# Patient Record
Sex: Female | Born: 1950
Health system: Southern US, Community
[De-identification: ages and names within clinical notes are randomized; demographics above are authoritative.]

## PROBLEM LIST (undated history)

## (undated) DIAGNOSIS — M1990 Unspecified osteoarthritis, unspecified site: Secondary | ICD-10-CM

## (undated) DIAGNOSIS — K219 Gastro-esophageal reflux disease without esophagitis: Secondary | ICD-10-CM

## (undated) DIAGNOSIS — R51 Headache: Secondary | ICD-10-CM

## (undated) DIAGNOSIS — M329 Systemic lupus erythematosus, unspecified: Secondary | ICD-10-CM

## (undated) DIAGNOSIS — F329 Major depressive disorder, single episode, unspecified: Secondary | ICD-10-CM

## (undated) DIAGNOSIS — IMO0002 Reserved for concepts with insufficient information to code with codable children: Secondary | ICD-10-CM

## (undated) DIAGNOSIS — I209 Angina pectoris, unspecified: Secondary | ICD-10-CM

## (undated) DIAGNOSIS — J40 Bronchitis, not specified as acute or chronic: Secondary | ICD-10-CM

## (undated) DIAGNOSIS — J189 Pneumonia, unspecified organism: Secondary | ICD-10-CM

## (undated) DIAGNOSIS — F32A Depression, unspecified: Secondary | ICD-10-CM

## (undated) DIAGNOSIS — I639 Cerebral infarction, unspecified: Secondary | ICD-10-CM

## (undated) DIAGNOSIS — E785 Hyperlipidemia, unspecified: Secondary | ICD-10-CM

## (undated) DIAGNOSIS — R0602 Shortness of breath: Secondary | ICD-10-CM

## (undated) DIAGNOSIS — I119 Hypertensive heart disease without heart failure: Secondary | ICD-10-CM

## (undated) DIAGNOSIS — M797 Fibromyalgia: Secondary | ICD-10-CM

## (undated) DIAGNOSIS — E669 Obesity, unspecified: Secondary | ICD-10-CM

## (undated) DIAGNOSIS — I10 Essential (primary) hypertension: Secondary | ICD-10-CM

## (undated) DIAGNOSIS — F419 Anxiety disorder, unspecified: Secondary | ICD-10-CM

## (undated) HISTORY — PX: CARDIAC CATHETERIZATION: SHX172

## (undated) HISTORY — PX: ABDOMINAL HYSTERECTOMY: SHX81

## (undated) HISTORY — PX: COLONOSCOPY: SHX174

## (undated) HISTORY — PX: FOOT SURGERY: SHX648

---

## 2000-01-09 ENCOUNTER — Encounter: Admission: RE | Admit: 2000-01-09 | Discharge: 2000-04-08 | Payer: Self-pay

## 2000-11-11 ENCOUNTER — Other Ambulatory Visit: Admission: RE | Admit: 2000-11-11 | Discharge: 2000-11-11 | Payer: Self-pay | Admitting: Obstetrics and Gynecology

## 2002-06-24 ENCOUNTER — Encounter: Admission: RE | Admit: 2002-06-24 | Discharge: 2002-06-24 | Payer: Self-pay

## 2002-08-06 ENCOUNTER — Encounter: Admission: RE | Admit: 2002-08-06 | Discharge: 2002-08-06 | Payer: Self-pay

## 2002-09-18 ENCOUNTER — Other Ambulatory Visit: Admission: RE | Admit: 2002-09-18 | Discharge: 2002-09-18 | Payer: Self-pay | Admitting: Obstetrics and Gynecology

## 2002-09-29 ENCOUNTER — Ambulatory Visit (HOSPITAL_COMMUNITY): Admission: RE | Admit: 2002-09-29 | Discharge: 2002-09-29 | Payer: Self-pay | Admitting: Obstetrics and Gynecology

## 2002-09-29 ENCOUNTER — Encounter: Payer: Self-pay | Admitting: Obstetrics and Gynecology

## 2005-12-11 ENCOUNTER — Other Ambulatory Visit: Admission: RE | Admit: 2005-12-11 | Discharge: 2005-12-11 | Payer: Self-pay | Admitting: Obstetrics and Gynecology

## 2007-09-03 ENCOUNTER — Ambulatory Visit (HOSPITAL_COMMUNITY): Admission: RE | Admit: 2007-09-03 | Discharge: 2007-09-03 | Payer: Self-pay | Admitting: *Deleted

## 2007-09-03 ENCOUNTER — Encounter (INDEPENDENT_AMBULATORY_CARE_PROVIDER_SITE_OTHER): Payer: Self-pay | Admitting: *Deleted

## 2007-11-18 ENCOUNTER — Ambulatory Visit (HOSPITAL_COMMUNITY): Admission: RE | Admit: 2007-11-18 | Discharge: 2007-11-18 | Payer: Self-pay | Admitting: *Deleted

## 2008-09-28 ENCOUNTER — Ambulatory Visit: Payer: Self-pay | Admitting: Obstetrics and Gynecology

## 2009-02-21 ENCOUNTER — Encounter: Admission: RE | Admit: 2009-02-21 | Discharge: 2009-05-22 | Payer: Self-pay | Admitting: Rheumatology

## 2009-10-24 ENCOUNTER — Other Ambulatory Visit: Payer: Self-pay

## 2011-04-24 NOTE — Op Note (Signed)
NAME:  Bridget Good, KAMER NO.:  [id number]   MEDICAL RECORD NO.:  [id number]          PATIENT TYPE:  AMB   LOCATION:  ENDO                         FACILITY:  [facility]   PHYSICIAN:  Georgiana Spinner, M.D.    DATE OF BIRTH:  07-Jan-1951   DATE OF PROCEDURE:  09/03/2007  DATE OF DISCHARGE:                               OPERATIVE REPORT   PROCEDURE:  Colonoscopy.   INDICATIONS:  Colon cancer screening.   ANESTHESIA:  Fentanyl 50 mcg, Versed 2 mg.   DESCRIPTION OF PROCEDURE:  With the patient mildly sedated in the left  lateral decubitus position, the Pentax videoscopic colonoscope was  inserted into the rectum and passed, under direct vision, to the cecum  identified by ileocecal valve and appendiceal orifice both of which were  photographed.  In the cecum there was some area of mucosa that was  somewhat reddened and raised.  I could not tell if this was normal  colonic mucosa or whether this was adenomatous tissue, so I elected to  biopsy it.  Once done, the colonoscope was then slowly withdrawn taking  circumferential views of colonic mucosa, stopping in the rectum which  appeared normal on direct, and showed hemorrhoids on retroflex view.  The endoscope was straightened and withdrawn.  The patient's vital  signs, and pulse oximeter remained stable.  The patient tolerated the  procedure well without apparent complications.   FINDINGS:  1. Internal hemorrhoids.  2. Question of polyp of the cecum.  3. Await biopsy report.  4. The patient will call me for results and follow up with me as an      outpatient.           ______________________________  Georgiana Spinner, M.D.     GMO/MEDQ  D:  09/03/2007  T:  09/03/2007  Job:  [id number]

## 2011-04-24 NOTE — Op Note (Signed)
NAME:  Bridget Good, HEUBERGER NO.:  [id number]   MEDICAL RECORD NO.:  [id number]          PATIENT TYPE:  AMB   LOCATION:  ENDO                         FACILITY:  [facility]   PHYSICIAN:  Georgiana Spinner, M.D.    DATE OF BIRTH:  [birth year]   DATE OF PROCEDURE:  11/18/2007  DATE OF DISCHARGE:                               OPERATIVE REPORT   PROCEDURE:  Upper endoscopy.   INDICATIONS FOR PROCEDURE:  Ulcers.   ANESTHESIA:  Fentanyl 50 mcg, Versed 5 mg.   PROCEDURE:  With patient mildly sedated in the left lateral decubitus  position, the Pentax videoscopic endoscope was inserted in the mouth,  passed under direct vision through the esophagus which appeared normal,  into the stomach.  Fundus, body, antrum, duodenal bulb, second portion  of duodenum were all visualized and appeared normal.  From this point,  the endoscope was slowly withdrawn, taking circumferential views of the  duodenal mucosa until the endoscope had been pulled back in the stomach,  placed in retroflexion to view the stomach from below.  The endoscope  was straightened and withdrawn, taking circumferential views of the  remaining gastric and esophageal mucosa.  Patient's vital signs and  pulse oximeter remained stable.  Patient tolerated the procedure well  without apparent complications.   FINDINGS:  Negative examination.   PLAN:  Have patient follow up with me as needed.  PPI can be  discontinued as long as patient is not on an NSAID, but would resume it  if NSAIDs are resumed.           ______________________________  Georgiana Spinner, M.D.     GMO/MEDQ  D:  11/18/2007  T:  11/18/2007  Job:  [id number]

## 2011-04-24 NOTE — Op Note (Signed)
NAME:  Bridget Good, Bridget Good NO.:  [id number]   MEDICAL RECORD NO.:  [id number]          PATIENT TYPE:  AMB   LOCATION:  ENDO                         FACILITY:  [facility]   PHYSICIAN:  Georgiana Spinner, M.D.    DATE OF BIRTH:  [birth year]   DATE OF PROCEDURE:  09/03/2007  DATE OF DISCHARGE:                               OPERATIVE REPORT   PROCEDURE PERFORMED:  Upper endoscopy.   INDICATIONS:  Gastroesophageal reflux disease.   ANESTHESIA:  Fentanyl 75 mcg, Versed 7 mg.   PROCEDURE:  With the patient mildly sedated in the left lateral  decubitus position, the Pentax videoscopic endoscope was inserted in the  mouth and passed under direct vision through the esophagus which  appeared normal.  The distal esophagus was approached and the  squamocolumnar junction was seen, photographed, and biopsies were taken  around the perimeter to rule out Barrett's esophagus.  We entered into  the stomach.  The fundus, body, antrum, duodenal bulb, and second  portion of the duodenum were visualized. From this point, the endoscope  was slowly withdrawn taking circumferential views of the duodenal mucosa  until the endoscope had been pulled back into the stomach and placed in  retroflexion to view the stomach from below. The endoscope was then  straightened and withdrawn taking circumferential views of the remaining  gastric and esophageal mucosa stopping to photograph ulcers seen in the  antrum, body and fundus that were also biopsied.  Then, the endoscope  was withdrawn.  The patient's vital signs and pulse oximeter remained  stable.  The patient tolerated the procedure well without apparent  complications.   FINDINGS:  Multiple ulcers in the stomach and antrum, body and fundus.  Biopsies of distal esophagus taken.  Await biopsy reports.  The patient  will call me for results and follow up with me as an outpatient.  Will  start the patient on PPI therapy pending results of biopsies.          ______________________________  Georgiana Spinner, M.D.     GMO/MEDQ  D:  09/03/2007  T:  09/03/2007  Job:  [id number]

## 2011-09-04 ENCOUNTER — Other Ambulatory Visit: Payer: Self-pay | Admitting: Gastroenterology

## 2011-09-07 ENCOUNTER — Other Ambulatory Visit: Payer: Self-pay

## 2011-09-19 ENCOUNTER — Ambulatory Visit
Admission: RE | Admit: 2011-09-19 | Discharge: 2011-09-19 | Disposition: A | Discharge status: Home / Self Care | Payer: PRIVATE HEALTH INSURANCE | Source: Ambulatory Visit | Attending: Gastroenterology | Admitting: Gastroenterology

## 2011-09-19 MED ORDER — IOHEXOL 300 MG/ML  SOLN: 100.0000 mL | Freq: Once | INTRAMUSCULAR | Status: AC | PRN

## 2011-12-30 ENCOUNTER — Other Ambulatory Visit: Payer: Self-pay

## 2011-12-30 ENCOUNTER — Emergency Department (HOSPITAL_COMMUNITY): Payer: PRIVATE HEALTH INSURANCE

## 2011-12-30 ENCOUNTER — Encounter (HOSPITAL_COMMUNITY): Payer: Self-pay | Admitting: *Deleted

## 2011-12-30 ENCOUNTER — Ambulatory Visit (INDEPENDENT_AMBULATORY_CARE_PROVIDER_SITE_OTHER): Payer: PRIVATE HEALTH INSURANCE

## 2011-12-30 ENCOUNTER — Inpatient Hospital Stay (HOSPITAL_COMMUNITY)
Admission: EM | Admit: 2011-12-30 | Discharge: 2012-01-02 | DRG: 303 | Disposition: A | Discharge status: Home / Self Care | Payer: PRIVATE HEALTH INSURANCE | Source: Ambulatory Visit | Attending: Cardiology | Admitting: Cardiology

## 2011-12-30 DIAGNOSIS — I10 Essential (primary) hypertension: Secondary | ICD-10-CM

## 2011-12-30 DIAGNOSIS — I2510 Atherosclerotic heart disease of native coronary artery without angina pectoris: Secondary | ICD-10-CM

## 2011-12-30 DIAGNOSIS — Z6835 Body mass index (BMI) 35.0-35.9, adult: Secondary | ICD-10-CM

## 2011-12-30 DIAGNOSIS — K219 Gastro-esophageal reflux disease without esophagitis: Secondary | ICD-10-CM | POA: Diagnosis present

## 2011-12-30 DIAGNOSIS — E669 Obesity, unspecified: Secondary | ICD-10-CM | POA: Diagnosis present

## 2011-12-30 DIAGNOSIS — R079 Chest pain, unspecified: Secondary | ICD-10-CM

## 2011-12-30 DIAGNOSIS — M1990 Unspecified osteoarthritis, unspecified site: Secondary | ICD-10-CM | POA: Diagnosis present

## 2011-12-30 DIAGNOSIS — Z79899 Other long term (current) drug therapy: Secondary | ICD-10-CM

## 2011-12-30 DIAGNOSIS — R040 Epistaxis: Secondary | ICD-10-CM

## 2011-12-30 DIAGNOSIS — R110 Nausea: Secondary | ICD-10-CM

## 2011-12-30 DIAGNOSIS — E785 Hyperlipidemia, unspecified: Secondary | ICD-10-CM | POA: Diagnosis present

## 2011-12-30 DIAGNOSIS — Z7982 Long term (current) use of aspirin: Secondary | ICD-10-CM

## 2011-12-30 DIAGNOSIS — E119 Type 2 diabetes mellitus without complications: Secondary | ICD-10-CM | POA: Diagnosis present

## 2011-12-30 DIAGNOSIS — I200 Unstable angina: Secondary | ICD-10-CM | POA: Diagnosis present

## 2011-12-30 DIAGNOSIS — I119 Hypertensive heart disease without heart failure: Secondary | ICD-10-CM

## 2011-12-30 DIAGNOSIS — E1169 Type 2 diabetes mellitus with other specified complication: Secondary | ICD-10-CM

## 2011-12-30 HISTORY — DX: Hyperlipidemia, unspecified: E78.5

## 2011-12-30 HISTORY — DX: Hypertensive heart disease without heart failure: I11.9

## 2011-12-30 HISTORY — DX: Reserved for concepts with insufficient information to code with codable children: IMO0002

## 2011-12-30 HISTORY — DX: Bronchitis, not specified as acute or chronic: J40

## 2011-12-30 HISTORY — DX: Headache: R51

## 2011-12-30 HISTORY — DX: Essential (primary) hypertension: I10

## 2011-12-30 HISTORY — DX: Gastro-esophageal reflux disease without esophagitis: K21.9

## 2011-12-30 HISTORY — DX: Shortness of breath: R06.02

## 2011-12-30 HISTORY — DX: Systemic lupus erythematosus, unspecified: M32.9

## 2011-12-30 HISTORY — DX: Fibromyalgia: M79.7

## 2011-12-30 HISTORY — DX: Angina pectoris, unspecified: I20.9

## 2011-12-30 HISTORY — DX: Obesity, unspecified: E66.9

## 2011-12-30 HISTORY — DX: Unspecified osteoarthritis, unspecified site: M19.90

## 2011-12-30 HISTORY — DX: Pneumonia, unspecified organism: J18.9

## 2011-12-30 LAB — POCT I-STAT, CHEM 8
BUN: 18 mg/dL (ref 6–23)
Calcium, Ion: 1.22 mmol/L (ref 1.12–1.32)
Chloride: 105 mEq/L (ref 96–112)
Creatinine, Ser: 1.2 mg/dL — ABNORMAL HIGH (ref 0.50–1.10)
Glucose, Bld: 134 mg/dL — ABNORMAL HIGH (ref 70–99)
HCT: 37 % (ref 36.0–46.0)
Hemoglobin: 12.6 g/dL (ref 12.0–15.0)
Potassium: 4.2 mEq/L (ref 3.5–5.1)
Sodium: 140 mEq/L (ref 135–145)
TCO2: 28 mmol/L (ref 0–100)

## 2011-12-30 LAB — CBC
HCT: 36.1 % (ref 36.0–46.0)
Hemoglobin: 11.8 g/dL — ABNORMAL LOW (ref 12.0–15.0)
MCH: 28.9 pg (ref 26.0–34.0)
MCHC: 32.7 g/dL (ref 30.0–36.0)
MCV: 88.5 fL (ref 78.0–100.0)
Platelets: 366 10*3/uL (ref 150–400)
RBC: 4.08 MIL/uL (ref 3.87–5.11)
RDW: 13.6 % (ref 11.5–15.5)
WBC: 7.5 10*3/uL (ref 4.0–10.5)

## 2011-12-30 LAB — DIFFERENTIAL
Basophils Absolute: 0 10*3/uL (ref 0.0–0.1)
Basophils Relative: 0 % (ref 0–1)
Eosinophils Absolute: 0.3 10*3/uL (ref 0.0–0.7)
Eosinophils Relative: 4 % (ref 0–5)
Lymphocytes Relative: 25 % (ref 12–46)
Lymphs Abs: 1.9 10*3/uL (ref 0.7–4.0)
Monocytes Absolute: 0.5 10*3/uL (ref 0.1–1.0)
Monocytes Relative: 7 % (ref 3–12)
Neutro Abs: 4.8 10*3/uL (ref 1.7–7.7)
Neutrophils Relative %: 64 % (ref 43–77)

## 2011-12-30 LAB — TROPONIN I
Troponin I: <0.3 ng/mL (ref <0.30)
Troponin I: <0.3 ng/mL (ref <0.30)
Troponin I: <0.3 ng/mL (ref <0.30)

## 2011-12-30 MED ORDER — ONDANSETRON HCL 4 MG/2ML IJ SOLN
INTRAMUSCULAR | Status: AC
  Filled 2011-12-30: qty 2

## 2011-12-30 MED ORDER — ONDANSETRON HCL 4 MG/2ML IJ SOLN
4.0000 mg | Freq: Once | INTRAMUSCULAR | Status: AC
  Administered 2011-12-30: 4 mg via INTRAVENOUS

## 2011-12-30 MED ORDER — MORPHINE SULFATE 4 MG/ML IJ SOLN
4.0000 mg | Freq: Once | INTRAMUSCULAR | Status: AC
  Administered 2011-12-30: 4 mg via INTRAVENOUS
  Filled 2011-12-30: qty 1

## 2011-12-30 MED ORDER — MORPHINE SULFATE 4 MG/ML IJ SOLN: 4.0000 mg | Freq: Once | INTRAMUSCULAR | Status: DC

## 2011-12-30 MED ORDER — ONDANSETRON HCL 4 MG/2ML IJ SOLN: 4.0000 mg | Freq: Once | INTRAMUSCULAR | Status: DC

## 2011-12-30 NOTE — ED Notes (Signed)
Pt comfortable family at the bedside.  She just returned from xray

## 2011-12-30 NOTE — ED Notes (Signed)
The pt is alert c/o only minor chest pain at present.  Alert oriented skin warm and dry.   She says she has had mid-chest pain intermittently all week.  This am she woke up with a headache and initially this am had a nose bleed.  She had a cath several years ago and was diagnosed with angina.

## 2011-12-30 NOTE — ED Provider Notes (Signed)
History     CSN: [id number]  Arrival date & time 12/30/11  1619   First MD Initiated Contact with Patient 12/30/11 1623      Chief Complaint  Patient presents with  . Chest Pain    (Consider location/radiation/quality/duration/timing/severity/associated sxs/prior treatment) The history is provided by the patient. No language interpreter was used.    61 year old female with history of diabetes presenting to the ED with chief complaints of chest pain. Patient states for the past week she has been experiencing intermittent bouts of chest pain. Patient describes pain as midsternal, with a pressure sensation sometimes sharp shooting pain. Pain occasionally radiates to the left arm. Pain sometimes worse with exertion as but at times at rest. She experience some headache and mild nausea without vomiting or diarrhea.  There is occasional shortness of breath with chest pain that is transient. She denies fever, cough, abdominal pain, dysuria. She has had angina equivalent chest pain in the past. Her last cardiac catherization was 5 years ago which shows some evidence of blockage according to the patient.    Past Medical History  Diagnosis Date  . Diabetes mellitus     History reviewed. No pertinent past surgical history.  History reviewed. No pertinent family history.  History  Substance Use Topics  . Smoking status: Never Smoker   . Smokeless tobacco: Not on file  . Alcohol Use: No    OB History    Grav Para Term Preterm Abortions TAB SAB Ect Mult Living                  Review of Systems  All other systems reviewed and are negative.    Allergies  Review of patient's allergies indicates not on file.  Home Medications  No current outpatient prescriptions on file.  There were no vitals taken for this visit.  Physical Exam  Nursing note and vitals reviewed. Constitutional: She is oriented to person, place, and time. She appears well-developed and well-nourished. No  distress.       Awake, alert, nontoxic appearance  HENT:  Head: Atraumatic.  Eyes: Right eye exhibits no discharge. Left eye exhibits no discharge.  Neck: Neck supple.  Cardiovascular: Normal rate and regular rhythm.   Pulmonary/Chest: Effort normal. No respiratory distress. She has no wheezes. She has no rales. She exhibits no tenderness.  Abdominal: Soft. There is no tenderness. There is no rebound.  Musculoskeletal: Normal range of motion. She exhibits no tenderness.       Baseline ROM, no obvious new focal weakness  Neurological: She is alert and oriented to person, place, and time.       Mental status and motor strength appears baseline for patient and situation  Skin: No rash noted.  Psychiatric: She has a normal mood and affect.    ED Course  Procedures (including critical care time)  Labs Reviewed - No data to display No results found.   No diagnosis found.   Date: 12/30/2011  Rate: 96  Rhythm: normal sinus rhythm  QRS Axis: left  Intervals: normal  ST/T Wave abnormalities: normal  Conduction Disutrbances:left anterior fascicular block  Narrative Interpretation:   Old EKG Reviewed: none available    MDM  Patient with history of diabetes and prior anginal pain presenting with increased angina.  Her pain has resolved after administration of sublingual nitroglycerin and aspirin.  Her EKG shows no evidence of ST elevation.  She has had prior catheterization 5 years ago.  No record available in our system.  I have discussed with my attending was seen and evaluate the patient.   8:47 PM Patient is currently pain-free. Her first troponin is negative. She is normal EKG, chest x-ray, and normal blood work. Patient is 5'5, and weight 212 pounds. Her calculated BMI is 35.3. Therefore, she would not likely meet the criteria to have a coronary CT. Patient will be sent to the CDU under the chest pain protocol. Plan to have cardiac stress test. Patient voiced understanding, and  agreement with plan.     Fayrene Helper, PA-C 01/01/12 [phone]

## 2011-12-30 NOTE — ED Provider Notes (Signed)
61 year old female comes in having been referred from urgent care where she presented with chest heaviness. Chest heaviness started at about 7:15 this morning and had been constant through the day. She was given 2 nitroglycerin at urgent care with partial relief of pain. She was also given aspirin an urgent care. EMS gave her a third nitroglycerin and pain has been completely relieved since then. She has a history of pain which consistently comes on with walking about a quarter mile. There's been no decrease in her exercise tolerance. Of note, she does have significant cardiac risk factors of diabetes and hypertension and a brother just had double bypass surgery and he is only 75. Her pain is somewhat atypical in that it has lasted multiple hours. With her risk factors, she will clearly need cardiac evaluation.  Dione Booze, MD 12/30/11 [phone]

## 2011-12-30 NOTE — ED Notes (Signed)
Pt arrived by [facility] from [facility].  C/o chest pain ss since this am.  Iv per [facility]

## 2011-12-30 NOTE — ED Notes (Signed)
Pt now states she is cp free. Will continue to monitor. Pt remains on monitor. Pt aware of POC

## 2011-12-30 NOTE — ED Notes (Signed)
The pt was given 324mg  aspirin and sl nitro enroute per [facility] and also was given zofran 4mg  iv for nausea

## 2011-12-30 NOTE — ED Notes (Signed)
RN introduced self to pt; pt chest pain free at this time and reports feeling better.

## 2011-12-30 NOTE — ED Provider Notes (Signed)
History     CSN: [id number]  Arrival date & time 12/30/11  1619   First MD Initiated Contact with Patient 12/30/11 1623      Chief Complaint  Patient presents with  . Chest Pain    (Consider location/radiation/quality/duration/timing/severity/associated sxs/prior treatment) HPI  Past Medical History  Diagnosis Date  . Diabetes mellitus     History reviewed. No pertinent past surgical history.  History reviewed. No pertinent family history.  History  Substance Use Topics  . Smoking status: Never Smoker   . Smokeless tobacco: Not on file  . Alcohol Use: No    OB History    Grav Para Term Preterm Abortions TAB SAB Ect Mult Living                  Review of Systems  Allergies  Sulfa antibiotics  Home Medications   Current Outpatient Rx  Name Route Sig Dispense Refill  . VITAMIN C PO Oral Take by mouth.    . ASPIRIN 81 MG PO CHEW Oral Chew 81 mg by mouth daily.    Marland Kitchen DILACOR XR PO Oral Take by mouth.    . MELOXICAM PO Oral Take by mouth.    . METFORMIN HCL PO Oral Take 0.5 tablets by mouth 2 (two) times daily.    Marland Kitchen SIMVASTATIN PO Oral Take by mouth.    Marland Kitchen JANUVIA PO Oral Take by mouth.    . TRAMADOL HCL PO Oral Take by mouth.    . DIOVAN PO Oral Take by mouth.      BP 137/70  Pulse 88  Temp(Src) 98.1 F (36.7 C) (Oral)  Resp 16  SpO2 99%  Physical Exam  ED Course  Procedures (including critical care time)  Labs Reviewed  CBC - Abnormal; Notable for the following:    Hemoglobin 11.8 (*)    All other components within normal limits  POCT I-STAT, CHEM 8 - Abnormal; Notable for the following:    Creatinine, Ser 1.20 (*)    Glucose, Bld 134 (*)    All other components within normal limits  DIFFERENTIAL  TROPONIN I  TROPONIN I  I-STAT, CHEM 8  TROPONIN I   Dg Chest 2 View  12/30/2011  *RADIOLOGY REPORT*  Clinical Data: Chest pain and shortness of breath.  CHEST - 2 VIEW  Comparison: PA and lateral chest 07/30/2011 and 10/20/2009.  Findings: The  lungs are clear.  Heart size is normal.  No pneumothorax or effusion.  No focal bony abnormality.  IMPRESSION: Negative chest.  Original Report Authenticated By: Bernadene Bell. D'ALESSIO, M.D.     1. Chest pain     11:02 PM Patient to CDU from [facility] on chest pain protocol. Patient seen and examined. She has very mild mid chest pain. Morphine ordered. She is not in any distress. Family and patient are in agreement with plan for coronary CT in the morning.  11:03 PM Exam:  Gen NAD; Heart RRR, nml S1,S2, no m/r/g; Lungs CTAB; Abd soft, NT, no rebound or guarding; Ext 2+ pedal pulses bilaterally, no edema.  11:26 PM Handoff to Dr. Karma Ganja.     MDM  CPP       Eustace Moore [place], [place] 12/30/11 2327

## 2011-12-30 NOTE — ED Notes (Addendum)
Cardiac Assessment: pt states she awakened with left sided cp, HA and nosebleed this a.m. Cp persisted throughout day. Pt denies radiating pain but states she did have some nausea and sob. Pt states she had 3 sl nitro prior to arrival.   Pt states she now has 3/10 cp at this time. PA Geiple made aware and orders given.

## 2011-12-30 NOTE — ED Notes (Signed)
Received report from Elizabeth, RN

## 2011-12-30 NOTE — ED Notes (Signed)
No chest pain requesting food.  given

## 2011-12-30 NOTE — ED Provider Notes (Signed)
Medical screening examination/treatment/procedure(s) were conducted as a shared visit with non-physician practitioner(s) and myself.  I personally evaluated the patient during the encounter   Dione Booze, MD 12/30/11 [phone]

## 2011-12-31 ENCOUNTER — Observation Stay (HOSPITAL_COMMUNITY): Payer: PRIVATE HEALTH INSURANCE

## 2011-12-31 ENCOUNTER — Encounter (HOSPITAL_COMMUNITY): Payer: Self-pay | Admitting: Cardiology

## 2011-12-31 ENCOUNTER — Other Ambulatory Visit: Payer: Self-pay

## 2011-12-31 DIAGNOSIS — E119 Type 2 diabetes mellitus without complications: Secondary | ICD-10-CM

## 2011-12-31 DIAGNOSIS — E1169 Type 2 diabetes mellitus with other specified complication: Secondary | ICD-10-CM

## 2011-12-31 DIAGNOSIS — E782 Mixed hyperlipidemia: Secondary | ICD-10-CM | POA: Insufficient documentation

## 2011-12-31 DIAGNOSIS — E785 Hyperlipidemia, unspecified: Secondary | ICD-10-CM | POA: Insufficient documentation

## 2011-12-31 DIAGNOSIS — K219 Gastro-esophageal reflux disease without esophagitis: Secondary | ICD-10-CM | POA: Insufficient documentation

## 2011-12-31 DIAGNOSIS — E669 Obesity, unspecified: Secondary | ICD-10-CM | POA: Insufficient documentation

## 2011-12-31 DIAGNOSIS — M1990 Unspecified osteoarthritis, unspecified site: Secondary | ICD-10-CM | POA: Insufficient documentation

## 2011-12-31 DIAGNOSIS — I119 Hypertensive heart disease without heart failure: Secondary | ICD-10-CM

## 2011-12-31 LAB — CARDIAC PANEL(CRET KIN+CKTOT+MB+TROPI)
CK, MB: 1.3 ng/mL (ref 0.3–4.0)
CK, MB: 1.4 ng/mL (ref 0.3–4.0)
Relative Index: INVALID (ref 0.0–2.5)
Relative Index: INVALID (ref 0.0–2.5)
Total CK: 47 U/L (ref 7–177)
Total CK: 45 U/L (ref 7–177)
Troponin I: <0.3 ng/mL (ref <0.30)
Troponin I: <0.3 ng/mL (ref <0.30)

## 2011-12-31 LAB — COMPREHENSIVE METABOLIC PANEL
ALT: 9 U/L (ref 0–35)
AST: 10 U/L (ref 0–37)
Albumin: 2.9 g/dL — ABNORMAL LOW (ref 3.5–5.2)
Alkaline Phosphatase: 89 U/L (ref 39–117)
BUN: 18 mg/dL (ref 6–23)
CO2: 29 mEq/L (ref 19–32)
Calcium: 9.1 mg/dL (ref 8.4–10.5)
Chloride: 106 mEq/L (ref 96–112)
Creatinine, Ser: 1.16 mg/dL — ABNORMAL HIGH (ref 0.50–1.10)
GFR calc Af Amer: 59 mL/min — ABNORMAL LOW (ref >90)
GFR calc non Af Amer: 51 mL/min — ABNORMAL LOW (ref >90)
Glucose, Bld: 105 mg/dL — ABNORMAL HIGH (ref 70–99)
Potassium: 4.1 mEq/L (ref 3.5–5.1)
Sodium: 141 mEq/L (ref 135–145)
Total Bilirubin: 0.3 mg/dL (ref 0.3–1.2)
Total Protein: 6.3 g/dL (ref 6.0–8.3)

## 2011-12-31 LAB — HEMOGLOBIN A1C
Hgb A1c MFr Bld: 8.4 % — ABNORMAL HIGH (ref <5.7)
Mean Plasma Glucose: 194 mg/dL — ABNORMAL HIGH (ref <117)

## 2011-12-31 LAB — APTT: aPTT: 42 seconds — ABNORMAL HIGH (ref 24–37)

## 2011-12-31 LAB — PROTIME-INR
INR: 1.07 (ref 0.00–1.49)
Prothrombin Time: 14.1 seconds (ref 11.6–15.2)

## 2011-12-31 LAB — GLUCOSE, CAPILLARY
Glucose-Capillary: 157 mg/dL — ABNORMAL HIGH (ref 70–99)
Glucose-Capillary: 102 mg/dL — ABNORMAL HIGH (ref 70–99)

## 2011-12-31 LAB — TSH: TSH: 1.17 u[IU]/mL (ref 0.350–4.500)

## 2011-12-31 MED ORDER — POTASSIUM CHLORIDE CRYS ER 20 MEQ PO TBCR
20.0000 meq | EXTENDED_RELEASE_TABLET | Freq: Every day | ORAL | Status: DC
  Administered 2012-01-01 – 2012-01-02 (×2): 20 meq via ORAL
  Filled 2011-12-31 (×2): qty 1

## 2011-12-31 MED ORDER — HEPARIN SOD (PORCINE) IN D5W 100 UNIT/ML IV SOLN
1500.0000 [IU]/h | INTRAVENOUS | Status: DC
  Administered 2011-12-31: 1400 [IU]/h via INTRAVENOUS
  Administered 2012-01-01: 1500 [IU]/h via INTRAVENOUS
  Administered 2012-01-01 (×2): 1600 [IU]/h via INTRAVENOUS
  Filled 2011-12-31 (×3): qty 250

## 2011-12-31 MED ORDER — METOPROLOL TARTRATE 25 MG PO TABS
100.0000 mg | ORAL_TABLET | Freq: Once | ORAL | Status: AC
  Administered 2011-12-31: 100 mg via ORAL
  Filled 2011-12-31: qty 4

## 2011-12-31 MED ORDER — POTASSIUM CHLORIDE 20 MEQ PO PACK
20.0000 meq | PACK | Freq: Every day | ORAL | Status: DC
  Filled 2011-12-31: qty 1

## 2011-12-31 MED ORDER — HEPARIN BOLUS VIA INFUSION
4000.0000 [IU] | Freq: Once | INTRAVENOUS | Status: AC
  Administered 2011-12-31: 4000 [IU] via INTRAVENOUS
  Filled 2011-12-31: qty 4000

## 2011-12-31 MED ORDER — METOPROLOL TARTRATE 1 MG/ML IV SOLN
5.0000 mg | Freq: Once | INTRAVENOUS | Status: AC
  Administered 2011-12-31: 5 mg via INTRAVENOUS
  Filled 2011-12-31: qty 10

## 2011-12-31 MED ORDER — ASPIRIN EC 81 MG PO TBEC
81.0000 mg | DELAYED_RELEASE_TABLET | Freq: Every day | ORAL | Status: DC
  Administered 2012-01-01 – 2012-01-02 (×2): 81 mg via ORAL
  Filled 2011-12-31 (×2): qty 1

## 2011-12-31 MED ORDER — SODIUM CHLORIDE 0.9 % IV SOLN: 250.0000 mL | INTRAVENOUS | Status: DC | PRN

## 2011-12-31 MED ORDER — SODIUM CHLORIDE 0.9 % IV BOLUS (SEPSIS)
1000.0000 mL | Freq: Once | INTRAVENOUS | Status: AC
  Administered 2011-12-31: 1000 mL via INTRAVENOUS

## 2011-12-31 MED ORDER — FUROSEMIDE 20 MG PO TABS
20.0000 mg | ORAL_TABLET | Freq: Every day | ORAL | Status: DC | PRN
  Filled 2011-12-31: qty 1

## 2011-12-31 MED ORDER — METOPROLOL TARTRATE 25 MG PO TABS
50.0000 mg | ORAL_TABLET | Freq: Four times a day (QID) | ORAL | Status: DC
  Administered 2011-12-31: 100 mg via ORAL
  Administered 2012-01-01 – 2012-01-02 (×5): 50 mg via ORAL
  Filled 2011-12-31 (×11): qty 2

## 2011-12-31 MED ORDER — METFORMIN HCL 500 MG PO TABS
500.0000 mg | ORAL_TABLET | Freq: Two times a day (BID) | ORAL | Status: DC
  Administered 2011-12-31 – 2012-01-02 (×3): 500 mg via ORAL
  Filled 2011-12-31 (×6): qty 1

## 2011-12-31 MED ORDER — OLMESARTAN MEDOXOMIL 40 MG PO TABS
40.0000 mg | ORAL_TABLET | Freq: Every day | ORAL | Status: DC
  Administered 2011-12-31 – 2012-01-02 (×3): 40 mg via ORAL
  Filled 2011-12-31 (×3): qty 1

## 2011-12-31 MED ORDER — NITROGLYCERIN 0.4 MG SL SUBL: 0.4000 mg | SUBLINGUAL_TABLET | SUBLINGUAL | Status: DC | PRN

## 2011-12-31 MED ORDER — SODIUM CHLORIDE 0.9 % IJ SOLN
3.0000 mL | INTRAMUSCULAR | Status: DC | PRN
  Administered 2011-12-31: 3 mL via INTRAVENOUS

## 2011-12-31 MED ORDER — IMIPRAMINE HCL 50 MG PO TABS
50.0000 mg | ORAL_TABLET | Freq: Every day | ORAL | Status: DC
  Administered 2011-12-31 – 2012-01-01 (×2): 50 mg via ORAL
  Filled 2011-12-31 (×3): qty 1

## 2011-12-31 MED ORDER — ACETAMINOPHEN 325 MG PO TABS: 650.0000 mg | ORAL_TABLET | ORAL | Status: DC | PRN

## 2011-12-31 MED ORDER — ONDANSETRON HCL 4 MG/2ML IJ SOLN: 4.0000 mg | Freq: Four times a day (QID) | INTRAMUSCULAR | Status: DC | PRN

## 2011-12-31 MED ORDER — VALSARTAN-HYDROCHLOROTHIAZIDE 320-25 MG PO TABS: 1.0000 | ORAL_TABLET | Freq: Every day | ORAL | Status: DC

## 2011-12-31 MED ORDER — LINAGLIPTIN 5 MG PO TABS
5.0000 mg | ORAL_TABLET | Freq: Every day | ORAL | Status: DC
  Administered 2012-01-01 – 2012-01-02 (×2): 5 mg via ORAL
  Filled 2011-12-31 (×3): qty 1

## 2011-12-31 MED ORDER — SIMVASTATIN 40 MG PO TABS
40.0000 mg | ORAL_TABLET | Freq: Every day | ORAL | Status: DC
  Administered 2011-12-31: 40 mg via ORAL
  Filled 2011-12-31 (×2): qty 1

## 2011-12-31 MED ORDER — GLIPIZIDE-METFORMIN HCL 2.5-500 MG PO TABS: 0.5000 | ORAL_TABLET | Freq: Two times a day (BID) | ORAL | Status: DC

## 2011-12-31 MED ORDER — TRAMADOL HCL 50 MG PO TABS: 50.0000 mg | ORAL_TABLET | Freq: Four times a day (QID) | ORAL | Status: DC | PRN

## 2011-12-31 MED ORDER — DILTIAZEM HCL ER COATED BEADS 240 MG PO CP24
240.0000 mg | ORAL_CAPSULE | Freq: Every day | ORAL | Status: DC
  Administered 2011-12-31 – 2012-01-02 (×3): 240 mg via ORAL
  Filled 2011-12-31 (×3): qty 1

## 2011-12-31 MED ORDER — METOPROLOL TARTRATE 25 MG PO TABS
ORAL_TABLET | ORAL | Status: AC
  Administered 2011-12-31: 100 mg via ORAL
  Filled 2011-12-31: qty 4

## 2011-12-31 MED ORDER — ASPIRIN 81 MG PO CHEW
81.0000 mg | CHEWABLE_TABLET | Freq: Every day | ORAL | Status: DC
  Administered 2011-12-31: 81 mg via ORAL
  Filled 2011-12-31: qty 1

## 2011-12-31 MED ORDER — DILTIAZEM HCL ER 180 MG PO CP24
180.0000 mg | ORAL_CAPSULE | Freq: Every day | ORAL | Status: DC
  Administered 2011-12-31: 180 mg via ORAL
  Filled 2011-12-31: qty 1

## 2011-12-31 MED ORDER — SODIUM CHLORIDE 0.9 % IJ SOLN
3.0000 mL | Freq: Two times a day (BID) | INTRAMUSCULAR | Status: DC
  Administered 2011-12-31 – 2012-01-01 (×2): 3 mL via INTRAVENOUS

## 2011-12-31 MED ORDER — HYDROCHLOROTHIAZIDE 25 MG PO TABS
25.0000 mg | ORAL_TABLET | Freq: Every day | ORAL | Status: DC
  Administered 2011-12-31 – 2012-01-02 (×3): 25 mg via ORAL
  Filled 2011-12-31 (×3): qty 1

## 2011-12-31 MED ORDER — INSULIN ASPART 100 UNIT/ML ~~LOC~~ SOLN
0.0000 [IU] | Freq: Three times a day (TID) | SUBCUTANEOUS | Status: DC
  Administered 2012-01-01: 3 [IU] via SUBCUTANEOUS
  Administered 2012-01-02 (×2): 4 [IU] via SUBCUTANEOUS
  Filled 2011-12-31: qty 3

## 2011-12-31 MED ORDER — VITAMIN C 500 MG PO TABS
500.0000 mg | ORAL_TABLET | Freq: Every day | ORAL | Status: DC
  Administered 2011-12-31 – 2012-01-02 (×3): 500 mg via ORAL
  Filled 2011-12-31 (×3): qty 1

## 2011-12-31 MED ORDER — MELOXICAM 15 MG PO TABS
15.0000 mg | ORAL_TABLET | Freq: Every day | ORAL | Status: DC
  Administered 2011-12-31 – 2012-01-02 (×3): 15 mg via ORAL
  Filled 2011-12-31 (×3): qty 1

## 2011-12-31 NOTE — H&P (Signed)
Admit date: 12/30/2011 Name:  Bridget Good Medical record number: [id number] DOB/Age:  1951/04/24  61 y.o.  Referring Physician:   [facility]  Primary physician: Dr. Dola Factor  Chief complaint/reason for admission:  Chest pain  HPI:  This very nice 61 year-old female has a history of hypertension, hyperlipidemia diabetes mellitus and obesity. Saturday eating she had not felt well and developed some vague heaviness in her chest and took a aspirin. She then developed a runny nose and some mild bleeding and noted some vague heaviness in her chest and had some mild nose bleeds. She then went to the urgent care Center after church on Sunday and received 2 nitroglycerin without relief and was transferred to [facility] by ambulance. The discomfort gradually went away. She had some vague arm pain and had some nausea as well as some heartburn but is feeling better at the present time. She then had an attempted a CT scan earlier today but could not achieve a low of heart rates in best cardiology was called. She is currently pain-free. She previously was inactive. She denied previous angina and had no PND, orthopnea or claudication.   Past Medical History  Diagnosis Date  . Diabetes mellitus type 2, noninsulin dependent   . Hypertensive heart disease without CHF   . Hyperlipidemia   . Obesity (BMI 30-39.9)   . GERD (gastroesophageal reflux disease)   . Osteoarthritis       Past Surgical History  Procedure Date  . Cesarean section     x 2  . Partial hysterectomy   . Foot surgery   .   Allergies: is allergic to sulfa antibiotics.    Medications:   Prior to Admission medications   Medication Sig Start Date End Date Taking? Authorizing Provider  aspirin 81 MG chewable tablet Chew 81 mg by mouth daily.   Yes Historical Provider, MD  diltiazem (CARDIZEM CD) 240 MG 24 hr capsule Take 240 mg by mouth daily.   Yes Historical Provider, MD  furosemide (LASIX) 20 MG  tablet Take 20 mg by mouth daily as needed. For fluid retension   Yes Historical Provider, MD  glipiZIDE-metformin (METAGLIP) 2.5-500 MG per tablet Take 0.5 tablets by mouth 2 (two) times daily before a meal.    Yes Historical Provider, MD  imipramine (TOFRANIL) 50 MG tablet Take 50 mg by mouth at bedtime.   Yes Historical Provider, MD  meloxicam (MOBIC) 15 MG tablet Take 15 mg by mouth daily.   Yes Historical Provider, MD  potassium chloride (KLOR-CON) 20 MEQ packet Take 20 mEq by mouth daily.   Yes Historical Provider, MD  simvastatin (ZOCOR) 40 MG tablet Take 40 mg by mouth every evening.   Yes Historical Provider, MD  sitaGLIPtin (JANUVIA) 100 MG tablet Take 100 mg by mouth daily.   Yes Historical Provider, MD  traMADol (ULTRAM) 50 MG tablet Take 50 mg by mouth every 6 (six) hours as needed. For pain   Yes Historical Provider, MD  valsartan-hydrochlorothiazide (DIOVAN-HCT) 320-25 MG per tablet Take 1 tablet by mouth daily.   Yes Historical Provider, MD  vitamin C (ASCORBIC ACID) 500 MG tablet Take 500 mg by mouth daily.   Yes Historical Provider, MD   Family history:  family history includes Diabetes in her father and sisters and Heart attack in her brother and father.  indicated that her mother is alive. She indicated that her father is deceased. She indicated that both of her sisters are alive.  She indicated that her brother is alive.   Social history:    reports that she has never smoked. She has never used smokeless tobacco. She reports that she does not drink alcohol or use illicit drugs.     Social History Narrative   Married.  Husband is psychotherapist.  Several children.  Husband is Education officer, environmental of [employer] in [place].     Review of Systems:  Negative except significant knee pain. She is at some vague nausea and has had her metformin reduced previously. She is an active. She complains of occasional headache and occasional blurred vision. She has some pain involving her ankles.  Does not have any incontinence or other issues. Other than as noted above, the remainder of the review of systems is normal  Physical Exam: BP 118/70  Pulse 80  Temp(Src) 98.4 F (36.9 C) (Oral)  Resp 15  SpO2 100% General appearance: alert, appears stated age and no distress Head: Normocephalic, without obvious abnormality, atraumatic Eyes: negative Ears: External ear and canals normal. Nose: Nares normal. Septum midline. Mucosa normal. No drainage or sinus tenderness. Throat: lips, mucosa, and tongue normal; teeth and gums normal Neck: no adenopathy, no carotid bruit, no JVD, supple, symmetrical, trachea midline and thyroid not enlarged, symmetric, no tenderness/mass/nodules Lungs: clear to auscultation bilaterally Heart: regular rate and rhythm, S1, S2 normal, no murmur, click, rub or gallop Abdomen: soft, non-tender; bowel sounds normal; no masses,  no organomegaly Extremities: extremities normal, atraumatic, no cyanosis or edema Pulses: 2+ and symmetric Skin: Skin color, texture, turgor normal. No rashes or lesions Neurologic: Alert and oriented X 3, normal strength and tone. Normal symmetric reflexes. Normal coordination and gait  Labs: Results for orders placed during the hospital encounter of 12/30/11 (from the past 24 hour(s))  CBC     Status: Abnormal   Collection Time   12/30/11  4:50 PM      Component Value Range   WBC 7.5  4.0 - 10.5 (K/uL)   RBC 4.08  3.87 - 5.11 (MIL/uL)   Hemoglobin 11.8 (*) 12.0 - 15.0 (g/dL)   HCT 16.1  09.6 - 04.5 (%)   MCV 88.5  78.0 - 100.0 (fL)   MCH 28.9  26.0 - 34.0 (pg)   MCHC 32.7  30.0 - 36.0 (g/dL)   RDW 40.9  81.1 - 91.4 (%)   Platelets 366  150 - 400 (K/uL)  DIFFERENTIAL     Status: Normal   Collection Time   12/30/11  4:50 PM      Component Value Range   Neutrophils Relative 64  43 - 77 (%)   Neutro Abs 4.8  1.7 - 7.7 (K/uL)   Lymphocytes Relative 25  12 - 46 (%)   Lymphs Abs 1.9  0.7 - 4.0 (K/uL)   Monocytes Relative 7  3  - 12 (%)   Monocytes Absolute 0.5  0.1 - 1.0 (K/uL)   Eosinophils Relative 4  0 - 5 (%)   Eosinophils Absolute 0.3  0.0 - 0.7 (K/uL)   Basophils Relative 0  0 - 1 (%)   Basophils Absolute 0.0  0.0 - 0.1 (K/uL)  TROPONIN I     Status: Normal   Collection Time   12/30/11  4:50 PM      Component Value Range   Troponin I <0.30  <0.30 (ng/mL)  POCT I-STAT, CHEM 8     Status: Abnormal   Collection Time   12/30/11  5:11 PM  Component Value Range   Sodium 140  135 - 145 (mEq/L)   Potassium 4.2  3.5 - 5.1 (mEq/L)   Chloride 105  96 - 112 (mEq/L)   BUN 18  6 - 23 (mg/dL)   Creatinine, Ser 1.61 (*) 0.50 - 1.10 (mg/dL)   Glucose, Bld 096 (*) 70 - 99 (mg/dL)   Calcium, Ion 0.45  4.09 - 1.32 (mmol/L)   TCO2 28  0 - 100 (mmol/L)   Hemoglobin 12.6  12.0 - 15.0 (g/dL)   HCT 81.1  91.4 - 78.2 (%)  TROPONIN I     Status: Normal   Collection Time   12/30/11  9:17 PM      Component Value Range   Troponin I <0.30  <0.30 (ng/mL)  TROPONIN I     Status: Normal   Collection Time   12/30/11 10:40 PM      Component Value Range   Troponin I <0.30  <0.30 (ng/mL)  GLUCOSE, CAPILLARY     Status: Abnormal   Collection Time   12/31/11  9:36 AM      Component Value Range   Glucose-Capillary 157 (*) 70 - 99 (mg/dL)    EKG: Normal sinus rhythm, normal EKG.  Radiology: Clear lungs, normal heart size   IMPRESSIONS: 1. Chest discomfort in a patient with multiple cardiovascular risks factors since resolved 2. Hypertensive heart disease 3. Non-insulin-dependent diabetes mellitus 4. Obesity 5. History of reflux 6. Hyperlipidemia under treatment  PLAN: Admit to telemetry floor, intravenous heparin for possible unstable angina. We'll administer beta blockers and try to achieve a heart rate such that she would be eligible to have a CT angiogram to rule out coronary artery disease in light of her multiple risk factors. If we cannot do this then consider Cardiolite stress testing.   Signed: Darden Palmer. MD [facility] 12/31/2011, 4:00 PM

## 2011-12-31 NOTE — ED Notes (Signed)
2003-01 Ready 

## 2011-12-31 NOTE — ED Notes (Signed)
Pt BMI 35.3 and is acceptable BMI for CTA according to CP protocol

## 2011-12-31 NOTE — ED Notes (Signed)
Ordered Heart Healthy tray 

## 2011-12-31 NOTE — ED Provider Notes (Signed)
8:51 AM Patient is in CDU under observation, chest pain protocol.  Received sign out from Dr Karma Ganja this morning.  Patient with intermittent chest pain x years, worse yesterday.  Per patient, she had cardiac cath approximately 5 years ago that showed slight blockage.   [facility]    Patient placed in CDU under protocol, scheduled for coronary CT this morning.  Patient states she had some mild lightheadedness overnight but denies CP, SOB, nausea.  On exam, pt is A&Ox4, NAD, RRR, no m/r/g, CTAB, abd soft, epigastric tenderness, extremities without edema but bilateral diffuse tenderness, distal pulses intact and equal bilaterally.  Will continue to follow.    11:06 AM Patient's heartrate remains elevated.  Dr Reche Dixon called, requested IVF if appropriate, then recheck.  Goal is for HR 65 during breath hold for 10-20 seconds.    12:41 PM Bolus has been given, patient continues to have HR of 71 while holding breath.  I have spoken with Dr Reche Dixon, who would like to proceed with the study.  Recommends checking to make sure CT is ready for [place] - RN Magda Paganini is making the call, then give patient 5mg  IV lopressor, then transport to CT where he will give further medications.  I have discussed this with Magda Paganini and have updated the patient and answered all of her questions.   1:57 PM Unable to do exam as patient's heartrate was not low enough.  I have called Dr Donnie Aho, on-call unassigned cardiology, who will come to consult on the patient.    2:56 PM Discussed plan with patient and family.  Mother is concerned because they have a strong family hx CAD, father died this year from heart complication and brother had MI last month.    4:02 PM Patient currently being seen by Dr Donnie Aho.    Patient discussed with Remi Haggard, NP, who assumes care of patient at change of shift.     Dillard Cannon [place], [place] 12/31/11 [phone]

## 2011-12-31 NOTE — ED Notes (Signed)
Patient transported to CT, BY RN

## 2011-12-31 NOTE — ED Notes (Signed)
Call received from Dr. Reche Dixon [facility]. Ordered 100mg  of metoprol PO for CTA. PA Chad made aware of same.

## 2011-12-31 NOTE — ED Provider Notes (Signed)
7:31 AM pt without complaints overnight, morning EKG as follows  Date: 12/31/2011 06:29am  Rate: 78  Rhythm: normal sinus rhythm  QRS Axis: left  Intervals: normal  ST/T Wave abnormalities: normal  Conduction Disutrbances:none  Narrative Interpretation:   Old EKG Reviewed: unchanged    Ethelda Chick, MD 12/31/11 [phone]

## 2011-12-31 NOTE — ED Notes (Signed)
Patient denies pain and is resting comfortably.  

## 2011-12-31 NOTE — Progress Notes (Signed)
ANTICOAGULATION CONSULT NOTE - Initial Consult  Pharmacy Consult for Heparin Indication: Possible angina  Allergies  Allergen Reactions  . Sulfa Antibiotics Hives, Itching and Nausea And Vomiting    Patient Measurements: Height: 5\' 5"  (165.1 cm) Weight: 211 lb 6.7 oz (95.9 kg) IBW/kg (Calculated) : 57  Heparin Dosing Weight: 69 kg  Vital Signs: Temp: 98.5 F (36.9 C) (01/21 1730) Temp src: Oral (01/21 1730) BP: 144/86 mmHg (01/21 1730) Pulse Rate: 83  (01/21 1730)  Labs:  Basename 12/31/11 1624 12/30/11 2240 12/30/11 2117 12/30/11 1711 12/30/11 1650  HGB -- -- -- 12.6 11.8*  HCT -- -- -- 37.0 36.1  PLT -- -- -- -- 366  APTT 42* -- -- -- --  LABPROT 14.1 -- -- -- --  INR 1.07 -- -- -- --  HEPARINUNFRC -- -- -- -- --  CREATININE 1.16* -- -- 1.20* --  CKTOTAL -- -- -- -- --  CKMB -- -- -- -- --  TROPONINI -- <0.30 <0.30 -- <0.30   Estimated Creatinine Clearance: 60.6 ml/min (by C-G formula based on Cr of 1.16).  Medical History: Past Medical History  Diagnosis Date  . Diabetes mellitus type 2, noninsulin dependent   . Hypertensive heart disease without CHF   . Hyperlipidemia   . Obesity (BMI 30-39.9)   . GERD (gastroesophageal reflux disease)   . Osteoarthritis     Medications:  Scheduled:    . aspirin  81 mg Oral Daily  . aspirin EC  81 mg Oral Daily  . diltiazem  240 mg Oral Daily  . diltiazem  180 mg Oral Daily  . glipiZIDE-metformin  0.5 tablet Oral BID AC  . imipramine  50 mg Oral QHS  . insulin aspart  0-20 Units Subcutaneous TID WC  . linagliptin  5 mg Oral Daily  . meloxicam  15 mg Oral Daily  . metFORMIN  500 mg Oral BID WC  . metoprolol  5 mg Intravenous Once  . metoprolol tartrate      . metoprolol tartrate  100 mg Oral Once  . metoprolol tartrate  50 mg Oral Q6H  .  morphine injection  4 mg Intravenous Once  . ondansetron      . ondansetron      . ondansetron (ZOFRAN) IV  4 mg Intravenous Once  . potassium chloride  20 mEq Oral Daily    . simvastatin  40 mg Oral q1800  . sodium chloride  1,000 mL Intravenous Once  . sodium chloride  3 mL Intravenous Q12H  . valsartan-hydrochlorothiazide  1 tablet Oral Daily  . vitamin C  500 mg Oral Daily  . DISCONTD:  morphine injection  4 mg Intravenous Once  . DISCONTD: ondansetron  4 mg Intravenous Once    Assessment: 61 year old beginning heparin therapy for possible angina  Goal of Therapy:  Heparin level 0.3-0.7 units/ml   Plan:  1) Heparin 4000 units iv bolus x 1 2) Heparin drip at 1400 units / hr 3) Heparin level 6 hours after heparin begins 4) Daily heparin level / CBC  Thank you.  Elwin Sleight 12/31/2011,5:40 PM

## 2011-12-31 NOTE — ED Provider Notes (Signed)
Medical screening examination/treatment/procedure(s) were conducted as a shared visit with non-physician practitioner(s) and myself.  I personally evaluated the patient during the encounter   Kely Dohn, MD 12/31/11 2250 

## 2011-12-31 NOTE — Progress Notes (Signed)
Observation review is complete. 

## 2012-01-01 ENCOUNTER — Other Ambulatory Visit: Payer: Self-pay

## 2012-01-01 ENCOUNTER — Inpatient Hospital Stay (HOSPITAL_COMMUNITY): Payer: PRIVATE HEALTH INSURANCE

## 2012-01-01 LAB — CBC
HCT: 32.3 % — ABNORMAL LOW (ref 36.0–46.0)
Hemoglobin: 10.6 g/dL — ABNORMAL LOW (ref 12.0–15.0)
MCH: 29.1 pg (ref 26.0–34.0)
MCHC: 32.8 g/dL (ref 30.0–36.0)
MCV: 88.7 fL (ref 78.0–100.0)
Platelets: 340 10*3/uL (ref 150–400)
RBC: 3.64 MIL/uL — ABNORMAL LOW (ref 3.87–5.11)
RDW: 13.8 % (ref 11.5–15.5)
WBC: 9.4 10*3/uL (ref 4.0–10.5)

## 2012-01-01 LAB — CARDIAC PANEL(CRET KIN+CKTOT+MB+TROPI)
CK, MB: 1.4 ng/mL (ref 0.3–4.0)
Relative Index: INVALID (ref 0.0–2.5)
Total CK: 48 U/L (ref 7–177)
Troponin I: <0.3 ng/mL (ref <0.30)

## 2012-01-01 LAB — GLUCOSE, CAPILLARY
Glucose-Capillary: 202 mg/dL — ABNORMAL HIGH (ref 70–99)
Glucose-Capillary: 189 mg/dL — ABNORMAL HIGH (ref 70–99)
Glucose-Capillary: 132 mg/dL — ABNORMAL HIGH (ref 70–99)

## 2012-01-01 LAB — HEPARIN LEVEL (UNFRACTIONATED)
Heparin Unfractionated: 0.23 IU/mL — ABNORMAL LOW (ref 0.30–0.70)
Heparin Unfractionated: 0.74 IU/mL — ABNORMAL HIGH (ref 0.30–0.70)

## 2012-01-01 LAB — LIPID PANEL
Cholesterol: 133 mg/dL (ref 0–200)
HDL: 40 mg/dL (ref >39)
LDL Cholesterol: 75 mg/dL (ref 0–99)
Total CHOL/HDL Ratio: 3.3 RATIO
Triglycerides: 92 mg/dL (ref <150)
VLDL: 18 mg/dL (ref 0–40)

## 2012-01-01 MED ORDER — METOPROLOL TARTRATE 1 MG/ML IV SOLN: 5.0000 mg | Freq: Once | INTRAVENOUS | Status: DC

## 2012-01-01 MED ORDER — METOPROLOL TARTRATE 1 MG/ML IV SOLN
INTRAVENOUS | Status: AC
  Administered 2012-01-01: 5 mg via INTRAVENOUS
  Filled 2012-01-01: qty 5

## 2012-01-01 MED ORDER — ROSUVASTATIN CALCIUM 20 MG PO TABS
20.0000 mg | ORAL_TABLET | Freq: Every day | ORAL | Status: DC
  Filled 2012-01-01: qty 1

## 2012-01-01 MED ORDER — ROSUVASTATIN CALCIUM 10 MG PO TABS
10.0000 mg | ORAL_TABLET | Freq: Every day | ORAL | Status: DC
  Administered 2012-01-01: 10 mg via ORAL
  Filled 2012-01-01 (×2): qty 1

## 2012-01-01 MED ORDER — ENOXAPARIN SODIUM 40 MG/0.4ML ~~LOC~~ SOLN
40.0000 mg | SUBCUTANEOUS | Status: DC
  Administered 2012-01-02: 40 mg via SUBCUTANEOUS
  Filled 2012-01-01: qty 0.4

## 2012-01-01 MED ORDER — NITROGLYCERIN 0.4 MG SL SUBL: 0.4000 mg | SUBLINGUAL_TABLET | Freq: Once | SUBLINGUAL | Status: DC

## 2012-01-01 MED ORDER — NITROGLYCERIN 0.4 MG SL SUBL
SUBLINGUAL_TABLET | SUBLINGUAL | Status: AC
  Administered 2012-01-01: 0.4 mg via SUBLINGUAL
  Filled 2012-01-01: qty 25

## 2012-01-01 MED ORDER — IOHEXOL 350 MG/ML SOLN
80.0000 mL | Freq: Once | INTRAVENOUS | Status: AC | PRN
  Administered 2012-01-01: 80 mL via INTRAVENOUS

## 2012-01-01 MED ORDER — PANTOPRAZOLE SODIUM 40 MG PO TBEC
40.0000 mg | DELAYED_RELEASE_TABLET | Freq: Every day | ORAL | Status: DC
  Administered 2012-01-01 – 2012-01-02 (×2): 40 mg via ORAL
  Filled 2012-01-01 (×2): qty 1

## 2012-01-01 NOTE — ED Provider Notes (Signed)
Medical screening examination/treatment/procedure(s) were conducted as a shared visit with non-physician practitioner(s) and myself.  I personally evaluated the patient during the encounter   Dione Booze, MD 01/01/12 [phone]

## 2012-01-01 NOTE — Progress Notes (Signed)
ANTICOAGULATION CONSULT NOTE - Follow Up Consult  Pharmacy Consult for heparin Indication: chest pain/ACS  Allergies  Allergen Reactions  . Sulfa Antibiotics Hives, Itching and Nausea And Vomiting    Patient Measurements: Height: 5\' 5"  (165.1 cm) Weight: 211 lb 6.7 oz (95.9 kg) IBW/kg (Calculated) : 57  Heparin Dosing Weight: 69kg  Vital Signs: Temp: 97.6 F (36.4 C) (01/21 2019) Temp src: Oral (01/21 2019) BP: 152/91 mmHg (01/22 0000) Pulse Rate: 63  (01/22 0000)  Labs:  Basename 01/01/12 0230 01/01/12 0126 01/01/12 0125 12/31/11 1842 12/31/11 1625 12/31/11 1624 12/30/11 1711 12/30/11 1650  HGB 10.6* -- -- -- -- -- 12.6 --  HCT 32.3* -- -- -- -- -- 37.0 36.1  PLT 340 -- -- -- -- -- -- 366  APTT -- -- -- -- -- 42* -- --  LABPROT -- -- -- -- -- 14.1 -- --  INR -- -- -- -- -- 1.07 -- --  HEPARINUNFRC -- 0.23* -- -- -- -- -- --  CREATININE -- -- -- -- -- 1.16* 1.20* --  CKTOTAL -- -- 48 45 47 -- -- --  CKMB -- -- 1.4 1.4 1.3 -- -- --  TROPONINI -- -- <0.30 <0.30 <0.30 -- -- --   Estimated Creatinine Clearance: 60.6 ml/min (by C-G formula based on Cr of 1.16).   Medications:  Scheduled:    . aspirin  81 mg Oral Daily  . aspirin EC  81 mg Oral Daily  . diltiazem  240 mg Oral Daily  . heparin  4,000 Units Intravenous Once  . olmesartan  40 mg Oral Daily   And  . hydrochlorothiazide  25 mg Oral Daily  . imipramine  50 mg Oral QHS  . insulin aspart  0-20 Units Subcutaneous TID WC  . linagliptin  5 mg Oral Daily  . meloxicam  15 mg Oral Daily  . metFORMIN  500 mg Oral BID WC  . metoprolol  5 mg Intravenous Once  . metoprolol tartrate  100 mg Oral Once  . metoprolol tartrate  50 mg Oral Q6H  . ondansetron      . ondansetron      . potassium chloride  20 mEq Oral Daily  . simvastatin  40 mg Oral q1800  . sodium chloride  1,000 mL Intravenous Once  . sodium chloride  3 mL Intravenous Q12H  . vitamin C  500 mg Oral Daily  . DISCONTD: diltiazem  180 mg Oral Daily    . DISCONTD: glipiZIDE-metformin  0.5 tablet Oral BID AC  . DISCONTD: potassium chloride  20 mEq Oral Daily  . DISCONTD: valsartan-hydrochlorothiazide  1 tablet Oral Daily  . DISCONTD: valsartan-hydrochlorothiazide  1 tablet Oral Daily   Infusions:    . heparin 1,400 Units/hr (12/31/11 1942)    Assessment: 60yo female subtherapeutic on heparin with initial dosing for CP.  Goal of Therapy:  Heparin level 0.3-0.7 units/ml   Plan:  Will increase gtt by ~2 units/kg/hr to 1600 units/hr and check level in 6hr.  Colleen Can PharmD BCPS 01/01/2012,3:25 AM

## 2012-01-01 NOTE — Progress Notes (Signed)
Pt to radiology for CT heart.  Verbal orders received from Dr. Llana Aliment for heart rate guidelines and lopressor administration.  Pt tolerated procedure will without complaint of discomfort, shortness of breath or chest pain.

## 2012-01-01 NOTE — Progress Notes (Signed)
Subjective:  Additional history.  Had cardiac cath 5 years ago, told minor blockage.  Saw Dr. Aleen Campi who has retired. Vague cramping chest pain early this am.  EKG normal.  Had CTA this pm.  Objective:  Vital Signs in the last 24 hours: BP 146/80  Pulse 55  Temp(Src) 98.7 F (37.1 C) (Oral)  Resp 18  Ht 5\' 5"  (1.651 m)  Wt 95.9 kg (211 lb 6.7 oz)  BMI 35.18 kg/m2  SpO2 98%  Physical Exam: Pleasant BF NAD Lungs:  Clear to A&P Cardiac:  Regular rhythm, normal S1 and S2, no S3  Extremities:  No edema present  Intake/Output from previous day: 01/21 0701 - 01/22 0700 In: 6 [I.V.:6] Out: -   Lab Results: Basic Metabolic Panel:  Basename 12/31/11 1624 12/30/11 1711  NA 141 140  K 4.1 4.2  CL 106 105  CO2 29 --  GLUCOSE 105* 134*  BUN 18 18  CREATININE 1.16* 1.20*   CBC:  Basename 01/01/12 0230 12/30/11 1711 12/30/11 1650  WBC 9.4 -- 7.5  NEUTROABS -- -- 4.8  HGB 10.6* 12.6 --  HCT 32.3* 37.0 --  MCV 88.7 -- 88.5  PLT 340 -- 366   Cardiac Enzymes:  Basename 01/01/12 0125 12/31/11 1842 12/31/11 1625  CKTOTAL 48 45 47  CKMB 1.4 1.4 1.3  CKMBINDEX -- -- --  TROPONINI <0.30 <0.30 <0.30    Protime: . Lab Results  Component Value Date   INR 1.07 12/31/2011    Telemetry: Sinus rhythm   Assessment/Plan:  1. Chest pain - negative enzymes unclear if ischemic or not.  EKG normal 2. CAD by CTA with calcification and moderate hard and soft plaque 3. Diabetes  Rec:  D/c heparin and watch.  lexiscan cardiolite to see if ischemic.       Darden Palmer.  MD [facility] 01/01/2012, 4:05 PM

## 2012-01-01 NOTE — Progress Notes (Signed)
INITIAL ADULT NUTRITION ASSESSMENT Date: 01/01/2012   Time: 10:17 AM Reason for Assessment: consult, unintentional weight loss and dysphagia  ASSESSMENT: Female 61 y.o.  Dx: Chest pain  Hx:  Past Medical History  Diagnosis Date  . Hyperlipidemia   . Obesity (BMI 30-39.9)   . GERD (gastroesophageal reflux disease)   . Osteoarthritis   . Diabetes mellitus   . Hypertension   . Angina   . Hypertensive heart disease without CHF   . Pneumonia   . Bronchitis   . Lupus     "treated for it from 1992 til 2012; dr said I don't have it anymore"  . Shortness of breath     lying down  . Shortness of breath on exertion   . Headache   . Fibromyalgia     Related Meds:     . aspirin  81 mg Oral Daily  . aspirin EC  81 mg Oral Daily  . diltiazem  240 mg Oral Daily  . heparin  4,000 Units Intravenous Once  . olmesartan  40 mg Oral Daily   And  . hydrochlorothiazide  25 mg Oral Daily  . imipramine  50 mg Oral QHS  . insulin aspart  0-20 Units Subcutaneous TID WC  . linagliptin  5 mg Oral Daily  . meloxicam  15 mg Oral Daily  . metFORMIN  500 mg Oral BID WC  . metoprolol  5 mg Intravenous Once  . metoprolol tartrate  50 mg Oral Q6H  . potassium chloride  20 mEq Oral Daily  . simvastatin  40 mg Oral q1800  . sodium chloride  1,000 mL Intravenous Once  . sodium chloride  3 mL Intravenous Q12H  . vitamin C  500 mg Oral Daily  . DISCONTD: diltiazem  180 mg Oral Daily  . DISCONTD: glipiZIDE-metformin  0.5 tablet Oral BID AC  . DISCONTD: potassium chloride  20 mEq Oral Daily  . DISCONTD: valsartan-hydrochlorothiazide  1 tablet Oral Daily  . DISCONTD: valsartan-hydrochlorothiazide  1 tablet Oral Daily     Ht: 5\' 5"  (165.1 cm)  Wt: 211 lb 6.7 oz (95.9 kg)  Ideal Wt: 57 kg  % Ideal Wt: 168%  Usual Wt: 240 lbs, 109 kg % Usual Wt: 87%  Body mass index is 35.18 kg/(m^2). Patient is obese.   Food/Nutrition Related Hx: Patient was trying to lose weight, the in April of 2012 she  began to lose weight unintentionally. Her MD told her she was losing weight to quickly, she had decreased appetite and some dysphagia. Also had some nausea. She started drinking Boost once daily to increase calorie and protein intake because she was eating very little.   Labs:  CMP     Component Value Date/Time   NA 141 12/31/2011 1624   K 4.1 12/31/2011 1624   CL 106 12/31/2011 1624   CO2 29 12/31/2011 1624   GLUCOSE 105* 12/31/2011 1624   BUN 18 12/31/2011 1624   CREATININE 1.16* 12/31/2011 1624   CALCIUM 9.1 12/31/2011 1624   PROT 6.3 12/31/2011 1624   ALBUMIN 2.9* 12/31/2011 1624   AST 10 12/31/2011 1624   ALT 9 12/31/2011 1624   ALKPHOS 89 12/31/2011 1624   BILITOT 0.3 12/31/2011 1624   GFRNONAA 51* 12/31/2011 1624   GFRAA 59* 12/31/2011 1624   I/O last 3 completed shifts: In: 6 [I.V.:6] Out: -    Diet Order: NPO, for CT scan  Supplements/Tube Feeding: none  IVF:    heparin Last Rate: 1,600 Units/hr (  01/01/12 0811)    Estimated Nutritional Needs:   Kcal: 1500-1700  Protein: 75-85 gm Fluid: 1.5 - 1.7 L  Patient was losing weight intentionally then it began to "fall off all on its own" with low appetite and poor po intake. Patient did not know what was casing poor appetite. Weight loss is indicated in this patient due to obesity. Patient has los 29 lbs, 12% since April 2012 (9months), this does not meet criteria for significant weight loss or malnutrition.   NUTRITION DIAGNOSIS:  Unintentional weight loss  RELATED TO: decreased appetite, nausea and dysphagia  AS EVIDENCE BY: 29 lbs weight loss in 9 months  MONITORING/EVALUATION(Goals): Goal: PO intake will meet >90% of estimated nutrition needs Monitor: Diet advance, PO intake, weight, labs, I/O's  EDUCATION NEEDS: -No education needs identified at this time  INTERVENTION: 1. RD will monitor PO intake for need for supplements once diet advances.    Dietitian [phone]  DOCUMENTATION CODES Per approved criteria    -Obesity Unspecified    Bridget Good 01/01/2012, 10:17 AM

## 2012-01-01 NOTE — Progress Notes (Signed)
ANTICOAGULATION CONSULT NOTE - Follow Up Consult  Pharmacy Consult for heparin Indication: chest pain/ACS  Allergies  Allergen Reactions  . Sulfa Antibiotics Hives, Itching and Nausea And Vomiting    Patient Measurements: Height: 5\' 5"  (165.1 cm) Weight: 211 lb 6.7 oz (95.9 kg) IBW/kg (Calculated) : 57  Heparin Dosing Weight:   Vital Signs: BP: 116/75 mmHg (01/22 0957) Pulse Rate: 60  (01/22 0600)  Labs:  Basename 01/01/12 1012 01/01/12 0230 01/01/12 0126 01/01/12 0125 12/31/11 1842 12/31/11 1625 12/31/11 1624 12/30/11 1711 12/30/11 1650  HGB -- 10.6* -- -- -- -- -- 12.6 --  HCT -- 32.3* -- -- -- -- -- 37.0 36.1  PLT -- 340 -- -- -- -- -- -- 366  APTT -- -- -- -- -- -- 42* -- --  LABPROT -- -- -- -- -- -- 14.1 -- --  INR -- -- -- -- -- -- 1.07 -- --  HEPARINUNFRC 0.74* -- 0.23* -- -- -- -- -- --  CREATININE -- -- -- -- -- -- 1.16* 1.20* --  CKTOTAL -- -- -- 48 45 47 -- -- --  CKMB -- -- -- 1.4 1.4 1.3 -- -- --  TROPONINI -- -- -- <0.30 <0.30 <0.30 -- -- --   Estimated Creatinine Clearance: 60.6 ml/min (by C-G formula based on Cr of 1.16).   Assessment: Anticoagulation: heparin Rx for CP; HL 0.74  Infectious Disease: afeb  Cardiovascular: VSS (asa, hctz, lopressor, benicar, dilt, zocor)  Endocrinology: hx DM: SSI, tradjenta, metformin ( cbgs 202); see sticky note regarding glipizide  Gastrointestinal / Nutrition: hx GERD  Neurology: imipramine  Nephrology: scr 1.16  Pulmonary: RA  Hematology / Oncology: h/h 10.6/32.3  PTA Medication Issues: med rec addressed  Best Practices: heparin  Goal of Therapy:  Heparin level 0.3-0.7 units/ml  Plan:  1) Heparin level is slightly supratherapeutic at 0.74.  Will decrease drip to 1500 units/hr 2) Check 6 hour heparin level  Sutter Ahlgren P 01/01/2012,11:12 AM

## 2012-01-02 ENCOUNTER — Inpatient Hospital Stay (HOSPITAL_COMMUNITY): Payer: PRIVATE HEALTH INSURANCE

## 2012-01-02 DIAGNOSIS — I2510 Atherosclerotic heart disease of native coronary artery without angina pectoris: Secondary | ICD-10-CM

## 2012-01-02 DIAGNOSIS — IMO0002 Reserved for concepts with insufficient information to code with codable children: Secondary | ICD-10-CM | POA: Insufficient documentation

## 2012-01-02 LAB — CBC
HCT: 33.2 % — ABNORMAL LOW (ref 36.0–46.0)
Hemoglobin: 10.7 g/dL — ABNORMAL LOW (ref 12.0–15.0)
MCH: 28.6 pg (ref 26.0–34.0)
MCHC: 32.2 g/dL (ref 30.0–36.0)
MCV: 88.8 fL (ref 78.0–100.0)
Platelets: 367 10*3/uL (ref 150–400)
RBC: 3.74 MIL/uL — ABNORMAL LOW (ref 3.87–5.11)
RDW: 13.6 % (ref 11.5–15.5)
WBC: 8 10*3/uL (ref 4.0–10.5)

## 2012-01-02 LAB — GLUCOSE, CAPILLARY
Glucose-Capillary: 118 mg/dL — ABNORMAL HIGH (ref 70–99)
Glucose-Capillary: 178 mg/dL — ABNORMAL HIGH (ref 70–99)
Glucose-Capillary: 193 mg/dL — ABNORMAL HIGH (ref 70–99)
Glucose-Capillary: 136 mg/dL — ABNORMAL HIGH (ref 70–99)

## 2012-01-02 MED ORDER — TECHNETIUM TC 99M TETROFOSMIN IV KIT
10.0000 | PACK | Freq: Once | INTRAVENOUS | Status: AC | PRN
  Administered 2012-01-02: 10 via INTRAVENOUS

## 2012-01-02 MED ORDER — METOPROLOL TARTRATE 50 MG PO TABS: 25.0000 mg | ORAL_TABLET | Freq: Two times a day (BID) | ORAL | Status: DC

## 2012-01-02 MED ORDER — NITROGLYCERIN 0.4 MG SL SUBL: 0.4000 mg | SUBLINGUAL_TABLET | SUBLINGUAL | Status: DC | PRN

## 2012-01-02 MED ORDER — TECHNETIUM TC 99M TETROFOSMIN IV KIT
30.0000 | PACK | Freq: Once | INTRAVENOUS | Status: AC | PRN
  Administered 2012-01-02: 30 via INTRAVENOUS

## 2012-01-02 MED ORDER — METFORMIN HCL 500 MG PO TABS: 500.0000 mg | ORAL_TABLET | Freq: Two times a day (BID) | ORAL | Status: DC

## 2012-01-02 MED ORDER — PANTOPRAZOLE SODIUM 40 MG PO TBEC: 40.0000 mg | DELAYED_RELEASE_TABLET | Freq: Every day | ORAL | Status: DC

## 2012-01-02 MED ORDER — REGADENOSON 0.4 MG/5ML IV SOLN
0.4000 mg | Freq: Once | INTRAVENOUS | Status: AC
  Administered 2012-01-02: 0.4 mg via INTRAVENOUS

## 2012-01-02 NOTE — Progress Notes (Signed)
Clinical Social Worker received referral for advance directive request. CSW met with patient and her son, Gerlene Burdock at bedside. CSW provided patient with the advance directive packet and a MOST form to be completed with her physician. CSW informed patient that the hospital can notarize form if it's complete during her admission and of other places that can assist with notarizing form outside of the hospital. CSW answered patient's questions. CSW will sign off as social work intervention is no longer needed. Please consult [place] again if new needs arises.   Rozetta Nunnery MSW, [employer] [phone]

## 2012-01-02 NOTE — Procedures (Signed)
Lexiscan cardiolite done without comps. Nausea and mild chest tightness with infusion. No ST changes.  Lacretia Nicks. Ashley Royalty MD [facility]

## 2012-01-02 NOTE — Discharge Summary (Signed)
Physician Discharge Summary  Patient ID: Bridget Good MRN: [id number] DOB/AGE: 1951-05-07 61 y.o.  Admit date: 12/30/2011 Discharge date: 01/02/2012  Primary Physician: Dr. Adela Lank  Primary Discharge Diagnosis: 1. Chest pain of undetermined etiology, myocardial infarction ruled out: Possible unstable angina pectoris  Secondary Discharge Diagnosis: 2. Coronary artery disease moderate 3. Hypertensive heart disease 4. Non-insulin-dependent diabetes mellitus 5. Obesity 6. Mild anemia 7. History of esophageal reflux 8. Hyperlipidemia under treatment 9. Remote history reported of lupus  Procedures::  Lexiscan Cardiolite study, CT angiogram of heart  Hospital Course: This very nice 61 year-old female has a history of hypertension, hyperlipidemia diabetes mellitus and obesity. Saturday eating she had not felt well and developed some vague heaviness in her chest and took a aspirin. She then developed a runny nose and some mild bleeding and noted some vague heaviness in her chest and had some mild nose bleeds. She then went to the urgent care Center after church on Sunday and received 2 nitroglycerin without relief and was transferred to [facility] by ambulance. The discomfort gradually went away. She had some vague arm pain and had some nausea as well as some heartburn but is feeling better at the present time. She then had an attempted a CT scan earlier today but could not achieve a low of heart rates and thus cardiology was called. She did have a history of having had coronary artery disease at catheterization of a mild degree about 5 years ago when she was seen by Dr. Aleen Campi.  The patient was brought into the hospital and placed on intravenous heparin. Serial cardiac enzymes were negative. She was started on metoprolol and achieved a low heart rate and underwent a cardiac CT the next day. She had a less than 50% plaque involving her left anterior descending and had plaque  involving the right coronary artery. There is minimal plaque involving the circumflex coronary artery. She had some vague atypical chest pain. She underwent today Lexiscan Cardiolite study the day of discharge that showed an ejection fraction of 69% with no ischemia.  At this point in time she is diagnosed with coronary artery disease but had atypical chest pain. She'll be placed on metoprolol 25 mg twice daily and given nitroglycerin. She should lose down to ideal body weight and get involved in a regular exercise program. In addition she needs to have excellent diabetic control and should have consideration of trying to achieve LDL levels of less than 70.  Discharge Exam: Blood pressure 126/81, pulse 65, temperature 97.5 F (36.4 C), temperature source Oral, resp. rate 18, height 5\' 5"  (1.651 m), weight 95.9 kg (211 lb 6.7 oz), SpO2 99.00%.   Lungs clear, no S3 or murmur.  Labs: CBC:   Lab Results  Component Value Date   WBC 8.0 01/02/2012   HGB 10.7* 01/02/2012   HCT 33.2* 01/02/2012   MCV 88.8 01/02/2012   PLT 367 01/02/2012   CMP:  Lab 12/31/11 1624  NA 141  K 4.1  CL 106  CO2 29  BUN 18  CREATININE 1.16*  CALCIUM 9.1  PROT 6.3  BILITOT 0.3  ALKPHOS 89  ALT 9  AST 10  GLUCOSE 105*   Lipid Panel     Component Value Date/Time   CHOL 133 01/01/2012 0230   TRIG 92 01/01/2012 0230   HDL 40 01/01/2012 0230   CHOLHDL 3.3 01/01/2012 0230   VLDL 18 01/01/2012 0230   LDLCALC 75 01/01/2012 0230   Cardiac Enzymes:  Basename 01/01/12 0125 12/31/11 1842 12/31/11 1625  CKTOTAL 48 45 47  CKMB 1.4 1.4 1.3  CKMBINDEX -- -- --  TROPONINI <0.30 <0.30 <0.30    Radiology: Cardiac CT scan CORONARY ARTERIES:  Left main coronary artery: Patent without plaque. Origin is along the superior and posterior aspect of the left sinus. Left anterior descending: There is a small amount of mixed plaque along the origin of the LAD. The stenosis is between 25-50%. There is circumferential calcified  plaque just beyond the first diagonal branch. The calcified segment is patent and the degree of narrowing is likely less than 50%. There is a prominent second diagonal branch with some mixed plaque near the origin. Additional plaque beyond the origin of the second diagonal branch. Left circumflex: Left circumflex artery is patent with minimal calcified plaque. There are two prominent obtuse marginal branches. Right coronary artery: The origin of the right coronary artery is patent with a small amount of soft plaque just beyond the origin. There is calcified plaque along the proximal right coronary artery without significant stenosis. Additional mixed plaque within the right coronary artery but no significant stenosis. Posterior descending artery: Patent Dominance: Right   CORONARY CALCIUM:  Total Agatston Score: 374  MESA database percentile: 97 %   CARDIAC MEASUREMENTS:  Interventricular septum (6 - 12 mm): 11 mm  LV posterior wall (6 - 12 mm): 15 mm  LV diameter in diastole (35 - 52 mm): 43 mm   AORTA AND PULMONARY MEASUREMENTS:  Aortic root (21 - 40 mm):  23 mm at the annulus  35 mm at the sinuses of Valsalva  24 mm at the sinotubular junction  Ascending aorta ( < 40 mm): 28 mm  Descending aorta ( < 40 mm): 23 mm  Main pulmonary artery: ( < 30 mm): 26 mm   EXTRACARDIAC FINDINGS:  No significant pericardial or pleural fluid. The visualized lungs  are clear. No gross bony abnormality.   IMPRESSION:  1. Extensive multivessel nonobstructive coronary artery disease. The most prominent disease involves the left anterior descending artery as described. The patient's total coronary artery calcium score is 373, which is 97 percentile for patient's matched age and gender.  2. Right coronary artery dominance.  EKG: Normal sinus rhythm, normal.  Discharge Medications:  Bridget Good, Bridget Good  Home Medication Instructions ZOX:[id number]   Printed on:01/02/12 1635  Medication Information                      aspirin 81 MG chewable tablet Chew 81 mg by mouth daily.           vitamin C (ASCORBIC ACID) 500 MG tablet Take 500 mg by mouth daily.           diltiazem (CARDIZEM CD) 240 MG 24 hr capsule Take 240 mg by mouth daily.           meloxicam (MOBIC) 15 MG tablet Take 15 mg by mouth daily.           glipiZIDE-metformin (METAGLIP) 2.5-500 MG per tablet Take 0.5 tablets by mouth 2 (two) times daily before a meal.            simvastatin (ZOCOR) 40 MG tablet Take 40 mg by mouth every evening.           sitaGLIPtin (JANUVIA) 100 MG tablet Take 100 mg by mouth daily.           traMADol (ULTRAM) 50 MG tablet Take 50 mg by mouth every  6 (six) hours as needed. For pain           valsartan-hydrochlorothiazide (DIOVAN-HCT) 320-25 MG per tablet Take 1 tablet by mouth daily.           potassium chloride (KLOR-CON) 20 MEQ packet Take 20 mEq by mouth daily.           imipramine (TOFRANIL) 50 MG tablet Take 50 mg by mouth at bedtime.           furosemide (LASIX) 20 MG tablet Take 20 mg by mouth daily as needed. For fluid retension           metoprolol tartrate (LOPRESSOR) 50 MG tablet Take 0.5 tablets (25 mg total) by mouth 2 (two) times daily.           nitroGLYCERIN (NITROSTAT) 0.4 MG SL tablet Place 1 tablet (0.4 mg total) under the tongue every 5 (five) minutes as needed for chest pain.           pantoprazole (PROTONIX) 40 MG tablet Take 1 tablet (40 mg total) by mouth daily at 12 noon.             Followup plans and appointments: She is to followup with Dr. Donnie Aho in one week. She is to see Dr. Juleen China in 2-3 weeks. We discussed the importance of diet, exercise, and modification further of her cardiac risk factors.   Time spent with patient to include physician time :45 minutes  Signed: W. Ashley Royalty. MD [facility] 01/02/2012, 4:35 PM

## 2012-01-02 NOTE — Progress Notes (Signed)
PHARMACIST - PHYSICIAN COMMUNICATION DR:  Donnie Aho CONCERNING:  METFORMIN SAFE ADMINISTRATION POLICY   Metformin has been  DISCONTINUED x 48 hours due to CTA of heart 1/22 w/ contrast.  It will resume on 01/04/2012. Current safety recommendations include avoiding metformin for a minimum of 48 hours after the patient's exposure to intravenous contrast media.  DESCRIPTION:  The Pharmacy Committee has adopted a policy that restricts the use of metformin in hospitalized patients until all the contraindications to administration have been ruled out. Specific contraindications are: []  Serum creatinine ? 1.5 for males []  Serum creatinine ? 1.4 for females []  Shock, acute MI, sepsis, hypoxemia, dehydration [x]  Planned administration of intravenous iodinated contrast media []  Heart Failure patients with low EF []  Acute or chronic metabolic acidosis (including DKA) Len Childs T 01/02/2012 10:19 AM

## 2013-07-02 ENCOUNTER — Other Ambulatory Visit (HOSPITAL_COMMUNITY): Payer: Self-pay | Admitting: Endocrinology

## 2013-07-02 DIAGNOSIS — R519 Headache, unspecified: Secondary | ICD-10-CM

## 2013-07-02 DIAGNOSIS — H538 Other visual disturbances: Secondary | ICD-10-CM

## 2013-07-03 ENCOUNTER — Ambulatory Visit (HOSPITAL_COMMUNITY): Admission: RE | Admit: 2013-07-03 | Payer: 59 | Source: Ambulatory Visit

## 2013-07-08 ENCOUNTER — Ambulatory Visit (HOSPITAL_COMMUNITY): Admission: RE | Admit: 2013-07-08 | Payer: 59 | Source: Ambulatory Visit

## 2013-07-16 ENCOUNTER — Ambulatory Visit (HOSPITAL_COMMUNITY)
Admission: RE | Admit: 2013-07-16 | Discharge: 2013-07-16 | Disposition: A | Discharge status: Home / Self Care | Payer: 59 | Source: Ambulatory Visit | Attending: Endocrinology | Admitting: Endocrinology

## 2013-07-16 DIAGNOSIS — R51 Headache: Secondary | ICD-10-CM | POA: Insufficient documentation

## 2013-07-16 DIAGNOSIS — R519 Headache, unspecified: Secondary | ICD-10-CM

## 2013-07-16 DIAGNOSIS — H538 Other visual disturbances: Secondary | ICD-10-CM

## 2013-07-16 MED ORDER — IOHEXOL 300 MG/ML  SOLN
100.0000 mL | Freq: Once | INTRAMUSCULAR | Status: AC | PRN
  Administered 2013-07-16: 100 mL via INTRAVENOUS

## 2013-10-29 ENCOUNTER — Ambulatory Visit: Payer: Self-pay | Admitting: Endocrinology

## 2013-11-09 ENCOUNTER — Ambulatory Visit: Payer: Self-pay | Admitting: Endocrinology

## 2014-03-08 ENCOUNTER — Encounter (INDEPENDENT_AMBULATORY_CARE_PROVIDER_SITE_OTHER): Payer: PRIVATE HEALTH INSURANCE | Admitting: Ophthalmology

## 2014-03-10 ENCOUNTER — Encounter (INDEPENDENT_AMBULATORY_CARE_PROVIDER_SITE_OTHER): Payer: PRIVATE HEALTH INSURANCE | Admitting: Ophthalmology

## 2014-10-13 ENCOUNTER — Ambulatory Visit (INDEPENDENT_AMBULATORY_CARE_PROVIDER_SITE_OTHER): Payer: BC Managed Care – PPO | Admitting: Obstetrics & Gynecology

## 2014-10-13 ENCOUNTER — Encounter: Payer: Self-pay | Admitting: Obstetrics & Gynecology

## 2014-10-13 VITALS — BP 144/89 | HR 82 | Ht 65.0 in | Wt 185.8 lb

## 2014-10-13 DIAGNOSIS — Z01419 Encounter for gynecological examination (general) (routine) without abnormal findings: Secondary | ICD-10-CM

## 2014-10-13 DIAGNOSIS — Z0000 Encounter for general adult medical examination without abnormal findings: Secondary | ICD-10-CM

## 2014-10-13 MED ORDER — ESTRADIOL 0.1 MG/GM VA CREA
TOPICAL_CREAM | VAGINAL | Status: DC
Start: 2014-10-13 — End: 2015-07-01

## 2014-10-13 NOTE — Progress Notes (Signed)
Patient is here for routine exam.  She is having increased back, stomach and leg pain, she did have an x-ray yesterday and this is being evaluated by her primary care physician but she just wanted to let us know.  She did have her mammogram yesterday at [facility] in [place].

## 2014-10-13 NOTE — Progress Notes (Addendum)
Subjective:    Bridget Good is a 63 y.o. MAA G2P2 (2 and 55 yo kids and 2 grands)  female who presents for an annual exam. The patient has no complaints today. The patient is sexually active. GYN screening history: last pap: was normal. The patient wears seatbelts: yes. The patient participates in regular exercise: yes. Has the patient ever been transfused or tattooed?: no. The patient reports that there is not domestic violence in her life.   Menstrual History: OB History    No data available      Menarche age: 61  No LMP recorded. Patient has had a hysterectomy.    The following portions of the patient's history were reviewed and updated as appropriate: allergies, current medications, past family history, past medical history, past social history, past surgical history and problem list.  Review of Systems A comprehensive review of systems was negative. Married for 43 years. Some dryness and pain with sex. She uses lube but would be interested in vaginal estrogen. She had her mammogram and flu vaccine yesterday. Her colonoscopy is UTD.   Objective:    BP 144/89 mmHg  Pulse 82  Ht 5\' 5"  (1.651 m)  Wt 185 lb 12.8 oz (84.278 kg)  BMI 30.92 kg/m2  General Appearance:    Alert, cooperative, no distress, appears stated age  Head:    Normocephalic, without obvious abnormality, atraumatic  Eyes:    PERRL, conjunctiva/corneas clear, EOM's intact, fundi    benign, both eyes  Ears:    Normal TM's and external ear canals, both ears  Nose:   Nares normal, septum midline, mucosa normal, no drainage    or sinus tenderness  Throat:   Lips, mucosa, and tongue normal; teeth and gums normal  Neck:   Supple, symmetrical, trachea midline, no adenopathy;    thyroid:  no enlargement/tenderness/nodules; no carotid   bruit or JVD  Back:     Symmetric, no curvature, ROM normal, no CVA tenderness  Lungs:     Clear to auscultation bilaterally, respirations unlabored  Chest Wall:    No tenderness or  deformity   Heart:    Regular rate and rhythm, S1 and S2 normal, no murmur, rub   or gallop  Breast Exam:    No tenderness, masses, or nipple abnormality  Abdomen:     Soft, non-tender, bowel sounds active all four quadrants,    no masses, no organomegaly  Genitalia:    Normal female without lesion, discharge or tenderness, NSSA, NT, normal adnexal exam     Extremities:   Extremities normal, atraumatic, no cyanosis or edema  Pulses:   2+ and symmetric all extremities  Skin:   Skin color, texture, turgor normal, no rashes or lesions  Lymph nodes:   Cervical, supraclavicular, and axillary nodes normal  Neurologic:   CNII-XII intact, normal strength, sensation and reflexes    throughout  .    Assessment:    Healthy female exam.    Plan:     Breast self exam technique reviewed and patient encouraged to perform self-exam monthly. Thin prep Pap smear. with cotesting Vaginal estrace QOD

## 2014-11-29 ENCOUNTER — Other Ambulatory Visit: Payer: Self-pay | Admitting: Endocrinology

## 2014-11-29 DIAGNOSIS — R102 Pelvic and perineal pain: Secondary | ICD-10-CM

## 2014-11-29 DIAGNOSIS — R109 Unspecified abdominal pain: Secondary | ICD-10-CM

## 2014-11-29 DIAGNOSIS — R1032 Left lower quadrant pain: Secondary | ICD-10-CM

## 2014-12-02 ENCOUNTER — Ambulatory Visit
Admission: RE | Admit: 2014-12-02 | Discharge: 2014-12-02 | Disposition: A | Discharge status: Home / Self Care | Payer: BC Managed Care – PPO | Source: Ambulatory Visit | Attending: Endocrinology | Admitting: Endocrinology

## 2014-12-02 DIAGNOSIS — R109 Unspecified abdominal pain: Secondary | ICD-10-CM

## 2014-12-02 DIAGNOSIS — R102 Pelvic and perineal pain: Secondary | ICD-10-CM

## 2014-12-02 DIAGNOSIS — R1032 Left lower quadrant pain: Secondary | ICD-10-CM

## 2014-12-06 ENCOUNTER — Other Ambulatory Visit: Payer: BC Managed Care – PPO

## 2014-12-17 ENCOUNTER — Ambulatory Visit (INDEPENDENT_AMBULATORY_CARE_PROVIDER_SITE_OTHER): Payer: PRIVATE HEALTH INSURANCE | Admitting: Ophthalmology

## 2014-12-24 ENCOUNTER — Other Ambulatory Visit: Payer: Self-pay | Admitting: Gastroenterology

## 2014-12-24 ENCOUNTER — Encounter: Payer: Self-pay | Admitting: *Deleted

## 2014-12-24 DIAGNOSIS — R1033 Periumbilical pain: Secondary | ICD-10-CM

## 2014-12-28 ENCOUNTER — Inpatient Hospital Stay: Admission: RE | Admit: 2014-12-28 | Payer: Self-pay | Source: Ambulatory Visit

## 2014-12-30 ENCOUNTER — Ambulatory Visit
Admission: RE | Admit: 2014-12-30 | Discharge: 2014-12-30 | Disposition: A | Discharge status: Home / Self Care | Payer: BLUE CROSS/BLUE SHIELD | Source: Ambulatory Visit | Attending: Gastroenterology | Admitting: Gastroenterology

## 2014-12-30 DIAGNOSIS — R1033 Periumbilical pain: Secondary | ICD-10-CM

## 2014-12-30 MED ORDER — IOHEXOL 300 MG/ML  SOLN
100.0000 mL | Freq: Once | INTRAMUSCULAR | Status: AC | PRN
  Administered 2014-12-30: 100 mL via INTRAVENOUS

## 2015-02-08 ENCOUNTER — Other Ambulatory Visit: Payer: Self-pay | Admitting: Gastroenterology

## 2015-02-08 DIAGNOSIS — R1033 Periumbilical pain: Secondary | ICD-10-CM

## 2015-02-08 DIAGNOSIS — R110 Nausea: Secondary | ICD-10-CM

## 2015-03-03 ENCOUNTER — Encounter (HOSPITAL_COMMUNITY): Payer: BLUE CROSS/BLUE SHIELD

## 2015-03-03 ENCOUNTER — Ambulatory Visit (HOSPITAL_COMMUNITY): Admission: RE | Admit: 2015-03-03 | Payer: BLUE CROSS/BLUE SHIELD | Source: Ambulatory Visit

## 2015-03-30 LAB — HM COLONOSCOPY

## 2015-05-26 ENCOUNTER — Emergency Department (HOSPITAL_COMMUNITY): Payer: BLUE CROSS/BLUE SHIELD

## 2015-05-26 ENCOUNTER — Emergency Department (HOSPITAL_COMMUNITY)
Admission: EM | Admit: 2015-05-26 | Discharge: 2015-05-26 | Disposition: A | Discharge status: Home / Self Care | Payer: BLUE CROSS/BLUE SHIELD | Attending: Emergency Medicine | Admitting: Emergency Medicine

## 2015-05-26 ENCOUNTER — Encounter (HOSPITAL_COMMUNITY): Payer: Self-pay | Admitting: Family Medicine

## 2015-05-26 DIAGNOSIS — K219 Gastro-esophageal reflux disease without esophagitis: Secondary | ICD-10-CM | POA: Insufficient documentation

## 2015-05-26 DIAGNOSIS — R51 Headache: Secondary | ICD-10-CM | POA: Diagnosis present

## 2015-05-26 DIAGNOSIS — H5713 Ocular pain, bilateral: Secondary | ICD-10-CM

## 2015-05-26 DIAGNOSIS — E785 Hyperlipidemia, unspecified: Secondary | ICD-10-CM | POA: Diagnosis not present

## 2015-05-26 DIAGNOSIS — E669 Obesity, unspecified: Secondary | ICD-10-CM | POA: Insufficient documentation

## 2015-05-26 DIAGNOSIS — Z8701 Personal history of pneumonia (recurrent): Secondary | ICD-10-CM | POA: Diagnosis not present

## 2015-05-26 DIAGNOSIS — Z7982 Long term (current) use of aspirin: Secondary | ICD-10-CM | POA: Diagnosis not present

## 2015-05-26 DIAGNOSIS — M1990 Unspecified osteoarthritis, unspecified site: Secondary | ICD-10-CM | POA: Insufficient documentation

## 2015-05-26 DIAGNOSIS — I119 Hypertensive heart disease without heart failure: Secondary | ICD-10-CM | POA: Diagnosis not present

## 2015-05-26 DIAGNOSIS — E119 Type 2 diabetes mellitus without complications: Secondary | ICD-10-CM | POA: Insufficient documentation

## 2015-05-26 DIAGNOSIS — R519 Headache, unspecified: Secondary | ICD-10-CM

## 2015-05-26 DIAGNOSIS — I209 Angina pectoris, unspecified: Secondary | ICD-10-CM | POA: Insufficient documentation

## 2015-05-26 DIAGNOSIS — R0789 Other chest pain: Secondary | ICD-10-CM | POA: Insufficient documentation

## 2015-05-26 LAB — BASIC METABOLIC PANEL
Anion gap: 10 (ref 5–15)
BUN: 22 mg/dL — ABNORMAL HIGH (ref 6–20)
CO2: 23 mmol/L (ref 22–32)
Calcium: 9.1 mg/dL (ref 8.9–10.3)
Chloride: 104 mmol/L (ref 101–111)
Creatinine, Ser: 1.32 mg/dL — ABNORMAL HIGH (ref 0.44–1.00)
GFR calc Af Amer: 49 mL/min — ABNORMAL LOW (ref >60)
GFR calc non Af Amer: 42 mL/min — ABNORMAL LOW (ref >60)
Glucose, Bld: 229 mg/dL — ABNORMAL HIGH (ref 65–99)
Potassium: 3.8 mmol/L (ref 3.5–5.1)
Sodium: 137 mmol/L (ref 135–145)

## 2015-05-26 LAB — CBC
HCT: 42.2 % (ref 36.0–46.0)
Hemoglobin: 13.8 g/dL (ref 12.0–15.0)
MCH: 29.5 pg (ref 26.0–34.0)
MCHC: 32.7 g/dL (ref 30.0–36.0)
MCV: 90.2 fL (ref 78.0–100.0)
Platelets: 304 10*3/uL (ref 150–400)
RBC: 4.68 MIL/uL (ref 3.87–5.11)
RDW: 13.9 % (ref 11.5–15.5)
WBC: 7 10*3/uL (ref 4.0–10.5)

## 2015-05-26 LAB — URINALYSIS, ROUTINE W REFLEX MICROSCOPIC
Bilirubin Urine: NEGATIVE
Glucose, UA: >1000 mg/dL — AB
Hgb urine dipstick: NEGATIVE
Ketones, ur: NEGATIVE mg/dL
Leukocytes, UA: NEGATIVE
Nitrite: NEGATIVE
Protein, ur: NEGATIVE mg/dL
Specific Gravity, Urine: 1.025 (ref 1.005–1.030)
Urobilinogen, UA: 1 mg/dL (ref 0.0–1.0)
pH: 5.5 (ref 5.0–8.0)

## 2015-05-26 LAB — I-STAT TROPONIN, ED: Troponin i, poc: 0 ng/mL (ref 0.00–0.08)

## 2015-05-26 LAB — URINE MICROSCOPIC-ADD ON

## 2015-05-26 LAB — CBG MONITORING, ED
Glucose-Capillary: 200 mg/dL — ABNORMAL HIGH (ref 65–99)
Glucose-Capillary: 123 mg/dL — ABNORMAL HIGH (ref 65–99)

## 2015-05-26 MED ORDER — ERYTHROMYCIN 5 MG/GM OP OINT: TOPICAL_OINTMENT | OPHTHALMIC | Status: DC

## 2015-05-26 MED ORDER — HYDROCODONE-ACETAMINOPHEN 5-325 MG PO TABS: 1.0000 | ORAL_TABLET | Freq: Four times a day (QID) | ORAL | Status: DC | PRN

## 2015-05-26 MED ORDER — SODIUM CHLORIDE 0.9 % IV BOLUS (SEPSIS)
1000.0000 mL | Freq: Once | INTRAVENOUS | Status: AC
Start: 2015-05-26 — End: 2015-05-26
  Administered 2015-05-26: 1000 mL via INTRAVENOUS

## 2015-05-26 MED ORDER — PROPARACAINE HCL 0.5 % OP SOLN
1.0000 [drp] | Freq: Once | OPHTHALMIC | Status: AC
  Administered 2015-05-26: 1 [drp] via OPHTHALMIC
  Filled 2015-05-26: qty 15

## 2015-05-26 MED ORDER — METOCLOPRAMIDE HCL 5 MG/ML IJ SOLN
10.0000 mg | Freq: Once | INTRAMUSCULAR | Status: AC
  Administered 2015-05-26: 10 mg via INTRAVENOUS
  Filled 2015-05-26: qty 2

## 2015-05-26 MED ORDER — BUTALBITAL-APAP-CAFFEINE 50-325-40 MG PO TABS
2.0000 | ORAL_TABLET | Freq: Once | ORAL | Status: AC
  Administered 2015-05-26: 2 via ORAL
  Filled 2015-05-26: qty 2

## 2015-05-26 MED ORDER — DIPHENHYDRAMINE HCL 50 MG/ML IJ SOLN
25.0000 mg | Freq: Once | INTRAMUSCULAR | Status: AC
  Administered 2015-05-26: 25 mg via INTRAVENOUS
  Filled 2015-05-26: qty 1

## 2015-05-26 NOTE — ED Notes (Signed)
Patient transported to MRI 

## 2015-05-26 NOTE — Discharge Instructions (Signed)
The cause of your eye pain was not identified today.  Please follow up with the eye doctor tomorrow for further evaluation.  Follow up with your doctor next week for recheck .   Headaches, Frequently Asked Questions MIGRAINE HEADACHES Q: What is migraine? What causes it? How can I treat it? A: Generally, migraine headaches begin as a dull ache. Then they develop into a constant, throbbing, and pulsating pain. You may experience pain at the temples. You may experience pain at the front or back of one or both sides of the head. The pain is usually accompanied by a combination of:  Nausea.  Vomiting.  Sensitivity to light and noise. Some people (about 15%) experience an aura (see below) before an attack. The cause of migraine is believed to be chemical reactions in the brain. Treatment for migraine may include over-the-counter or prescription medications. It may also include self-help techniques. These include relaxation training and biofeedback.  Q: What is an aura? A: About 15% of people with migraine get an "aura". This is a sign of neurological symptoms that occur before a migraine headache. You may see wavy or jagged lines, dots, or flashing lights. You might experience tunnel vision or blind spots in one or both eyes. The aura can include visual or auditory hallucinations (something imagined). It may include disruptions in smell (such as strange odors), taste or touch. Other symptoms include:  Numbness.  A "pins and needles" sensation.  Difficulty in recalling or speaking the correct word. These neurological events may last as long as 60 minutes. These symptoms will fade as the headache begins. Q: What is a trigger? A: Certain physical or environmental factors can lead to or "trigger" a migraine. These include:  Foods.  Hormonal changes.  Weather.  Stress. It is important to remember that triggers are different for everyone. To help prevent migraine attacks, you need to figure out  which triggers affect you. Keep a headache diary. This is a good way to track triggers. The diary will help you talk to your healthcare professional about your condition. Q: Does weather affect migraines? A: Bright sunshine, hot, humid conditions, and drastic changes in barometric pressure may lead to, or "trigger," a migraine attack in some people. But studies have shown that weather does not act as a trigger for everyone with migraines. Q: What is the link between migraine and hormones? A: Hormones start and regulate many of your body's functions. Hormones keep your body in balance within a constantly changing environment. The levels of hormones in your body are unbalanced at times. Examples are during menstruation, pregnancy, or menopause. That can lead to a migraine attack. In fact, about three quarters of all women with migraine report that their attacks are related to the menstrual cycle.  Q: Is there an increased risk of stroke for migraine sufferers? A: The likelihood of a migraine attack causing a stroke is very remote. That is not to say that migraine sufferers cannot have a stroke associated with their migraines. In persons under age 27, the most common associated factor for stroke is migraine headache. But over the course of a person's normal life span, the occurrence of migraine headache may actually be associated with a reduced risk of dying from cerebrovascular disease due to stroke.  Q: What are acute medications for migraine? A: Acute medications are used to treat the pain of the headache after it has started. Examples over-the-counter medications, NSAIDs, ergots, and triptans.  Q: What are the triptans? A: Triptans  are the newest class of abortive medications. They are specifically targeted to treat migraine. Triptans are vasoconstrictors. They moderate some chemical reactions in the brain. The triptans work on receptors in your brain. Triptans help to restore the balance of a  neurotransmitter called serotonin. Fluctuations in levels of serotonin are thought to be a main cause of migraine.  Q: Are over-the-counter medications for migraine effective? A: Over-the-counter, or "OTC," medications may be effective in relieving mild to moderate pain and associated symptoms of migraine. But you should see your caregiver before beginning any treatment regimen for migraine.  Q: What are preventive medications for migraine? A: Preventive medications for migraine are sometimes referred to as "prophylactic" treatments. They are used to reduce the frequency, severity, and length of migraine attacks. Examples of preventive medications include antiepileptic medications, antidepressants, beta-blockers, calcium channel blockers, and NSAIDs (nonsteroidal anti-inflammatory drugs). Q: Why are anticonvulsants used to treat migraine? A: During the past few years, there has been an increased interest in antiepileptic drugs for the prevention of migraine. They are sometimes referred to as "anticonvulsants". Both epilepsy and migraine may be caused by similar reactions in the brain.  Q: Why are antidepressants used to treat migraine? A: Antidepressants are typically used to treat people with depression. They may reduce migraine frequency by regulating chemical levels, such as serotonin, in the brain.  Q: What alternative therapies are used to treat migraine? A: The term "alternative therapies" is often used to describe treatments considered outside the scope of conventional Western medicine. Examples of alternative therapy include acupuncture, acupressure, and yoga. Another common alternative treatment is herbal therapy. Some herbs are believed to relieve headache pain. Always discuss alternative therapies with your caregiver before proceeding. Some herbal products contain arsenic and other toxins. TENSION HEADACHES Q: What is a tension-type headache? What causes it? How can I treat it? A:  Tension-type headaches occur randomly. They are often the result of temporary stress, anxiety, fatigue, or anger. Symptoms include soreness in your temples, a tightening band-like sensation around your head (a "vice-like" ache). Symptoms can also include a pulling feeling, pressure sensations, and contracting head and neck muscles. The headache begins in your forehead, temples, or the back of your head and neck. Treatment for tension-type headache may include over-the-counter or prescription medications. Treatment may also include self-help techniques such as relaxation training and biofeedback. CLUSTER HEADACHES Q: What is a cluster headache? What causes it? How can I treat it? A: Cluster headache gets its name because the attacks come in groups. The pain arrives with little, if any, warning. It is usually on one side of the head. A tearing or bloodshot eye and a runny nose on the same side of the headache may also accompany the pain. Cluster headaches are believed to be caused by chemical reactions in the brain. They have been described as the most severe and intense of any headache type. Treatment for cluster headache includes prescription medication and oxygen. SINUS HEADACHES Q: What is a sinus headache? What causes it? How can I treat it? A: When a cavity in the bones of the face and skull (a sinus) becomes inflamed, the inflammation will cause localized pain. This condition is usually the result of an allergic reaction, a tumor, or an infection. If your headache is caused by a sinus blockage, such as an infection, you will probably have a fever. An x-ray will confirm a sinus blockage. Your caregiver's treatment might include antibiotics for the infection, as well as antihistamines or decongestants.  REBOUND HEADACHES Q: What is a rebound headache? What causes it? How can I treat it? A: A pattern of taking acute headache medications too often can lead to a condition known as "rebound headache." A  pattern of taking too much headache medication includes taking it more than 2 days per week or in excessive amounts. That means more than the label or a caregiver advises. With rebound headaches, your medications not only stop relieving pain, they actually begin to cause headaches. Doctors treat rebound headache by tapering the medication that is being overused. Sometimes your caregiver will gradually substitute a different type of treatment or medication. Stopping may be a challenge. Regularly overusing a medication increases the potential for serious side effects. Consult a caregiver if you regularly use headache medications more than 2 days per week or more than the label advises. ADDITIONAL QUESTIONS AND ANSWERS Q: What is biofeedback? A: Biofeedback is a self-help treatment. Biofeedback uses special equipment to monitor your body's involuntary physical responses. Biofeedback monitors:  Breathing.  Pulse.  Heart rate.  Temperature.  Muscle tension.  Brain activity. Biofeedback helps you refine and perfect your relaxation exercises. You learn to control the physical responses that are related to stress. Once the technique has been mastered, you do not need the equipment any more. Q: Are headaches hereditary? A: Four out of five (80%) of people that suffer report a family history of migraine. Scientists are not sure if this is genetic or a family predisposition. Despite the uncertainty, a child has a 50% chance of having migraine if one parent suffers. The child has a 75% chance if both parents suffer.  Q: Can children get headaches? A: By the time they reach high school, most young people have experienced some type of headache. Many safe and effective approaches or medications can prevent a headache from occurring or stop it after it has begun.  Q: What type of doctor should I see to diagnose and treat my headache? A: Start with your primary caregiver. Discuss his or her experience and  approach to headaches. Discuss methods of classification, diagnosis, and treatment. Your caregiver may decide to recommend you to a headache specialist, depending upon your symptoms or other physical conditions. Having diabetes, allergies, etc., may require a more comprehensive and inclusive approach to your headache. The National Headache Foundation will provide, upon request, a list of [facility] physician members in your state. Document Released: 02/16/2004 Document Revised: 02/18/2012 Document Reviewed: 07/26/2008 [facility] Patient Information 2015 [place], [place]. This information is not intended to replace advice given to you by your health care provider. Make sure you discuss any questions you have with your health care provider.

## 2015-05-26 NOTE — ED Notes (Signed)
MD at bedside. 

## 2015-05-26 NOTE — ED Notes (Signed)
Patient complains of mid-sternal chest pain, hyperglycemia (359mg /dL at home), and headache. Chest pain started this morning at 4:00am. Sunday morning all the other symptoms started.

## 2015-05-26 NOTE — ED Notes (Signed)
nruse drawing labs

## 2015-05-26 NOTE — ED Notes (Signed)
CBG 123 

## 2015-05-26 NOTE — ED Notes (Signed)
CBG 200. RN made aware

## 2015-05-26 NOTE — ED Notes (Signed)
Pt transported to MRI, will draw blood upon pt's return.

## 2015-05-26 NOTE — ED Provider Notes (Signed)
CSN: [id number]     Arrival date & time 05/26/15  1103 History   First MD Initiated Contact with Patient 05/26/15 1134     Chief Complaint  Patient presents with  . Hyperglycemia  . Headache  . Chest Pain     Patient is a 64 y.o. female presenting with hyperglycemia, headaches, and chest pain. The history is provided by the patient. No language interpreter was used.  Hyperglycemia Associated symptoms: chest pain   Headache Chest Pain Associated symptoms: headache    Bridget Good presents for evaluation of multiple complaints,  Bridget Good complains of elevated blood sugar, headache, chest pain. In terms of headache, this is been ongoing for the last several days. It is waxing and waning in nature. It is dull and throbbing. There is associated pain in bilateral eyes. No fevers, vomiting, diarrhea. Bridget Good's had some intermittent chest pressure and discomfort that has been ongoing, waxing and waning since 4 AM this morning. Bridget Good has a history of chest discomfort that Bridget Good typically takes nitroglycerin for, Bridget Good has not tried it for this discomfort today. Bridget Good does not currently have any chest pain. This morning Bridget Good also awoke with weakness in her right lower extremity. Bridget Good difficulty walking because the foot was floppy. Bridget Good had some associated numbness in the right foot. This has improved since 4 AM. Bridget Good was previously on Januvia for her diabetes but this has recently been changed due to insurance issues.  Past Medical History  Diagnosis Date  . Hyperlipidemia   . Obesity (BMI 30-39.9)   . GERD (gastroesophageal reflux disease)   . Osteoarthritis   . Diabetes mellitus   . Hypertension   . Angina   . Hypertensive heart disease without CHF   . Pneumonia   . Bronchitis   . Lupus     "treated for it from 1992 til 2012; dr said I don't have it anymore"  . Shortness of breath     lying down  . Shortness of breath on exertion   . Headache(784.0)   . Fibromyalgia    Past Surgical History  Procedure  Laterality Date  . Cesarean section  1978; 1981  . Foot surgery      "had to cut it to let the fluids out; it had swollen very badly; left foot"  . Abdominal hysterectomy      partial  . Cardiac catheterization  ~ 2007   Family History  Problem Relation Age of Onset  . Heart attack Father   . Diabetes Father   . Heart attack Brother   . Diabetes Sister   . Diabetes Sister    History  Substance Use Topics  . Smoking status: Never Smoker   . Smokeless tobacco: Never Used  . Alcohol Use: No   OB History    No data available     Review of Systems  Cardiovascular: Positive for chest pain.  Neurological: Positive for headaches.  All other systems reviewed and are negative.     Allergies  Sulfa antibiotics  Home Medications   Prior to Admission medications   Medication Sig Start Date End Date Taking? Authorizing Provider  aspirin 81 MG chewable tablet Chew 81 mg by mouth daily.    Historical Provider, MD  diltiazem (CARDIZEM CD) 240 MG 24 hr capsule Take 240 mg by mouth daily.    Historical Provider, MD  estradiol (ESTRACE) 0.1 MG/GM vaginal cream Apply 1 gram per vagina every night for 2 weeks, then apply three times a week 10/13/14  Emily Filbert, MD  furosemide (LASIX) 20 MG tablet Take 20 mg by mouth daily as needed. For fluid retension    Historical Provider, MD  glipiZIDE-metformin (METAGLIP) 2.5-500 MG per tablet Take 0.5 tablets by mouth 2 (two) times daily before a meal.     Historical Provider, MD  imipramine (TOFRANIL) 50 MG tablet Take 50 mg by mouth at bedtime.    Historical Provider, MD  metoprolol tartrate (LOPRESSOR) 50 MG tablet Take 0.5 tablets (25 mg total) by mouth 2 (two) times daily. 01/02/12 01/01/13  Jacolyn Reedy, MD  nitroGLYCERIN (NITROSTAT) 0.4 MG SL tablet Place 1 tablet (0.4 mg total) under the tongue every 5 (five) minutes as needed for chest pain. 01/02/12 01/01/13  Jacolyn Reedy, MD  pantoprazole (PROTONIX) 40 MG tablet Take 1 tablet (40 mg  total) by mouth daily at 12 noon. 01/02/12 01/01/13  Jacolyn Reedy, MD  potassium chloride (KLOR-CON) 20 MEQ packet Take 20 mEq by mouth daily.    Historical Provider, MD  simvastatin (ZOCOR) 40 MG tablet Take 40 mg by mouth every evening.    Historical Provider, MD  traMADol (ULTRAM) 50 MG tablet Take 50 mg by mouth every 6 (six) hours as needed. For pain    Historical Provider, MD  valsartan-hydrochlorothiazide (DIOVAN-HCT) 320-25 MG per tablet Take 1 tablet by mouth daily.    Historical Provider, MD  vitamin C (ASCORBIC ACID) 500 MG tablet Take 500 mg by mouth daily.    Historical Provider, MD   BP 147/79 mmHg  Pulse 70  Temp(Src) 97.5 F (36.4 C) (Oral)  Resp 20  Ht 5\' 4"  (1.626 m)  Wt 189 lb (85.73 kg)  BMI 32.43 kg/m2  SpO2 99% Physical Exam  Constitutional: Bridget Good is oriented to person, place, and time. Bridget Good appears well-developed and well-nourished.  HENT:  Head: Normocephalic and atraumatic.  Eyes: EOM are normal. Pupils are equal, round, and reactive to light.  Conjunctival injection bilaterally, right greater than left  Cardiovascular: Normal rate and regular rhythm.   No murmur heard. Pulmonary/Chest: Effort normal and breath sounds normal. No respiratory distress.  Abdominal: Soft. There is no tenderness. There is no rebound and no guarding.  Musculoskeletal: Bridget Good exhibits no edema or tenderness.  Neurological: Bridget Good is alert and oriented to person, place, and time. No cranial nerve deficit. Coordination normal.  5 out of 5 strength in all 4 extremities, sensation and light touch intact in all 4 extremities  Skin: Skin is warm and dry.  Psychiatric: Bridget Good has a normal mood and affect. Her behavior is normal.  Nursing note and vitals reviewed.   ED Course  Procedures (including critical care time) Labs Review Labs Reviewed  BASIC METABOLIC PANEL - Abnormal; Notable for the following:    Glucose, Bld 229 (*)    BUN 22 (*)    Creatinine, Ser 1.32 (*)    GFR calc non Af  Amer 42 (*)    GFR calc Af Amer 49 (*)    All other components within normal limits  URINALYSIS, ROUTINE W REFLEX MICROSCOPIC (NOT AT [facility]) - Abnormal; Notable for the following:    Glucose, UA >1000 (*)    All other components within normal limits  CBG MONITORING, ED - Abnormal; Notable for the following:    Glucose-Capillary 200 (*)    All other components within normal limits  CBG MONITORING, ED - Abnormal; Notable for the following:    Glucose-Capillary 123 (*)    All other components within normal  limits  CBC  URINE MICROSCOPIC-ADD ON  I-STAT TROPOININ, ED    Imaging Review Ct Head Wo Contrast  05/26/2015   CLINICAL DATA:  Headache for 4 days  EXAM: CT HEAD WITHOUT CONTRAST  TECHNIQUE: Contiguous axial images were obtained from the base of the skull through the vertex without intravenous contrast.  COMPARISON:  July 16, 2013  FINDINGS: The ventricles are normal in size and configuration. There is no intracranial mass, hemorrhage, extra-axial fluid collection, or midline shift. Gray-white compartments are normal. No acute infarct apparent. The bony calvarium appears intact. The visualized mastoid air cells are clear.  IMPRESSION: No intracranial mass, hemorrhage, or focal gray - white compartment lesions/acute appearing infarct.   Electronically Signed   By: Lowella Grip III M.D.   On: 05/26/2015 12:38   Dg Chest Port 1 View  05/26/2015   CLINICAL DATA:  63 year old female with hyperglycemia and headache with new left-sided chest pain since 4 a.m. this morning.  EXAM: PORTABLE CHEST - 1 VIEW  COMPARISON:  No priors.  FINDINGS: Lung volumes are normal. No consolidative airspace disease. No pleural effusions. No pneumothorax. No pulmonary nodule or mass noted. Pulmonary vasculature and the cardiomediastinal silhouette are within normal limits.  IMPRESSION: No radiographic evidence of acute cardiopulmonary disease.   Electronically Signed   By: Vinnie Langton M.D.   On: 05/26/2015  11:59     EKG Interpretation   Date/Time:  Thursday May 26 2015 11:16:23 EDT Ventricular Rate:  63 PR Interval:  129 QRS Duration: 88 QT Interval:  399 QTC Calculation: 408 R Axis:   -59 Text Interpretation:  Sinus rhythm LAD, consider left anterior fascicular  block Borderline T abnormalities, inferior leads Confirmed by Hazle Coca  [phone]) on 05/26/2015 11:53:18 AM      MDM   Final diagnoses:  Acute nonintractable headache, unspecified headache type  Eye pain, bilateral    Patient here for evaluation of multiple complaints. In terms of chest pain, this is not consistent with CHF, ACS, PE. In terms of headache this is not consistent with subarachnoid hemorrhage, meningitis, dural sinus thrombosis, temporal arteritis. In terms of right lower extremity weakness this morning, MRI is negative for any evidence of acute ischemia or abnormality. For her eye pain, pressures in the right eye are 12 and 13 in the left eye. There is no evidence of foreign body, corneal abrasion, acute angle-closure glaucoma. Discussed with patient importance of ophthalmology follow-up and Bridget Good's been referred to Dr. Maylene Roes. Starting erythromycin for possible conjunctivitis. Discussed outpatient follow-up, home care, close return precautions.  Quintella Reichert, MD 05/27/15 [phone]

## 2015-07-01 ENCOUNTER — Ambulatory Visit (INDEPENDENT_AMBULATORY_CARE_PROVIDER_SITE_OTHER): Payer: BLUE CROSS/BLUE SHIELD | Admitting: Obstetrics & Gynecology

## 2015-07-01 VITALS — BP 126/84 | HR 99 | Ht 64.0 in | Wt 191.0 lb

## 2015-07-01 DIAGNOSIS — N898 Other specified noninflammatory disorders of vagina: Secondary | ICD-10-CM | POA: Diagnosis not present

## 2015-07-01 MED ORDER — METRONIDAZOLE 500 MG PO TABS: 500.0000 mg | ORAL_TABLET | Freq: Two times a day (BID) | ORAL | Status: DC

## 2015-07-01 NOTE — Progress Notes (Signed)
   Subjective:    Patient ID: Bridget Good, female    DOB: 10/25/1951, 64 y.o.   MRN: [id number]  HPI This M AA lovely 64 yo lady here with 2 issues. She has been having some breast discomfort and vaginal itching. Three weeks ago she accidentally used her hair removing solution instead of her vagisil on her vulva. The burning has improved greatly.   Review of Systems Strong FH of breast and colon cancer    Objective:   Physical Exam [id number] Breathing, ambulating, and conversing normally Abd- obese, benign Breast exam normal bilaterally EG normal White discharge in vagina c/w BV Bimanual exam negative       Assessment & Plan:  FH of cancer- check [facility] My Risk Continue annual mammograms and SBEs BV- check wet prep and treat with flagyl RTC for annual in 12/16

## 2015-07-01 NOTE — Progress Notes (Signed)
Vaginal irritation,just started new Diabetes medication.  Also accidentally applied a hair removal cream to her vulvar area causing burn. Has noticed soreness in left breast, she has not identified a lump.

## 2015-07-02 LAB — WET PREP, GENITAL
Clue Cells Wet Prep HPF POC: NONE SEEN
Trich, Wet Prep: NONE SEEN
Yeast Wet Prep HPF POC: NONE SEEN

## 2015-08-10 ENCOUNTER — Encounter: Payer: Self-pay | Admitting: *Deleted

## 2015-08-10 NOTE — Progress Notes (Signed)
Scanned documents/results (MyRisk BRCA Analysis) into chart.  I have called patient and left a message for patient to call the office regarding test results.  I have mailed patient a patient copy of her test results.   

## 2016-05-17 ENCOUNTER — Emergency Department (HOSPITAL_COMMUNITY): Payer: BLUE CROSS/BLUE SHIELD

## 2016-05-17 ENCOUNTER — Emergency Department (HOSPITAL_COMMUNITY)
Admission: EM | Admit: 2016-05-17 | Discharge: 2016-05-17 | Disposition: A | Discharge status: Home / Self Care | Payer: BLUE CROSS/BLUE SHIELD | Attending: Emergency Medicine | Admitting: Emergency Medicine

## 2016-05-17 ENCOUNTER — Encounter (HOSPITAL_COMMUNITY): Payer: Self-pay | Admitting: Emergency Medicine

## 2016-05-17 DIAGNOSIS — Z6839 Body mass index (BMI) 39.0-39.9, adult: Secondary | ICD-10-CM | POA: Diagnosis not present

## 2016-05-17 DIAGNOSIS — Z7982 Long term (current) use of aspirin: Secondary | ICD-10-CM | POA: Diagnosis not present

## 2016-05-17 DIAGNOSIS — E669 Obesity, unspecified: Secondary | ICD-10-CM | POA: Insufficient documentation

## 2016-05-17 DIAGNOSIS — E119 Type 2 diabetes mellitus without complications: Secondary | ICD-10-CM | POA: Insufficient documentation

## 2016-05-17 DIAGNOSIS — Z79899 Other long term (current) drug therapy: Secondary | ICD-10-CM | POA: Insufficient documentation

## 2016-05-17 DIAGNOSIS — R079 Chest pain, unspecified: Secondary | ICD-10-CM

## 2016-05-17 DIAGNOSIS — Z7984 Long term (current) use of oral hypoglycemic drugs: Secondary | ICD-10-CM | POA: Insufficient documentation

## 2016-05-17 DIAGNOSIS — I10 Essential (primary) hypertension: Secondary | ICD-10-CM | POA: Diagnosis not present

## 2016-05-17 DIAGNOSIS — E785 Hyperlipidemia, unspecified: Secondary | ICD-10-CM | POA: Insufficient documentation

## 2016-05-17 LAB — HEPATIC FUNCTION PANEL
ALT: 13 U/L — ABNORMAL LOW (ref 14–54)
AST: 16 U/L (ref 15–41)
Albumin: 4.4 g/dL (ref 3.5–5.0)
Alkaline Phosphatase: 81 U/L (ref 38–126)
Bilirubin, Direct: 0.2 mg/dL (ref 0.1–0.5)
Indirect Bilirubin: 1 mg/dL — ABNORMAL HIGH (ref 0.3–0.9)
Total Bilirubin: 1.2 mg/dL (ref 0.3–1.2)
Total Protein: 8.3 g/dL — ABNORMAL HIGH (ref 6.5–8.1)

## 2016-05-17 LAB — I-STAT TROPONIN, ED
Troponin i, poc: 0 ng/mL (ref 0.00–0.08)
Troponin i, poc: 0.01 ng/mL (ref 0.00–0.08)

## 2016-05-17 LAB — LIPASE, BLOOD: Lipase: 20 U/L (ref 11–51)

## 2016-05-17 LAB — BASIC METABOLIC PANEL
Anion gap: 8 (ref 5–15)
BUN: 24 mg/dL — ABNORMAL HIGH (ref 6–20)
CO2: 26 mmol/L (ref 22–32)
Calcium: 9.3 mg/dL (ref 8.9–10.3)
Chloride: 104 mmol/L (ref 101–111)
Creatinine, Ser: 1.13 mg/dL — ABNORMAL HIGH (ref 0.44–1.00)
GFR calc Af Amer: 59 mL/min — ABNORMAL LOW (ref >60)
GFR calc non Af Amer: 51 mL/min — ABNORMAL LOW (ref >60)
Glucose, Bld: 135 mg/dL — ABNORMAL HIGH (ref 65–99)
Potassium: 3.7 mmol/L (ref 3.5–5.1)
Sodium: 138 mmol/L (ref 135–145)

## 2016-05-17 LAB — CBC
HCT: 42.4 % (ref 36.0–46.0)
Hemoglobin: 14.6 g/dL (ref 12.0–15.0)
MCH: 30 pg (ref 26.0–34.0)
MCHC: 34.4 g/dL (ref 30.0–36.0)
MCV: 87.1 fL (ref 78.0–100.0)
Platelets: 298 10*3/uL (ref 150–400)
RBC: 4.87 MIL/uL (ref 3.87–5.11)
RDW: 13.8 % (ref 11.5–15.5)
WBC: 6.5 10*3/uL (ref 4.0–10.5)

## 2016-05-17 LAB — D-DIMER, QUANTITATIVE (NOT AT ARMC): D-Dimer, Quant: <0.27 ug/mL-FEU (ref 0.00–0.50)

## 2016-05-17 NOTE — Discharge Instructions (Signed)
Nonspecific Chest Pain  °Chest pain can be caused by many different conditions. There is always a chance that your pain could be related to something serious, such as a heart attack or a blood clot in your lungs. Chest pain can also be caused by conditions that are not life-threatening. If you have chest pain, it is very important to follow up with your health care provider. °CAUSES  °Chest pain can be caused by: °· Heartburn. °· Pneumonia or bronchitis. °· Anxiety or stress. °· Inflammation around your heart (pericarditis) or lung (pleuritis or pleurisy). °· A blood clot in your lung. °· A collapsed lung (pneumothorax). It can develop suddenly on its own (spontaneous pneumothorax) or from trauma to the chest. °· Shingles infection (varicella-zoster virus). °· Heart attack. °· Damage to the bones, muscles, and cartilage that make up your chest wall. This can include: °¨ Bruised bones due to injury. °¨ Strained muscles or cartilage due to frequent or repeated coughing or overwork. °¨ Fracture to one or more ribs. °¨ Sore cartilage due to inflammation (costochondritis). °RISK FACTORS  °Risk factors for chest pain may include: °· Activities that increase your risk for trauma or injury to your chest. °· Respiratory infections or conditions that cause frequent coughing. °· Medical conditions or overeating that can cause heartburn. °· Heart disease or family history of heart disease. °· Conditions or health behaviors that increase your risk of developing a blood clot. °· Having had chicken pox (varicella zoster). °SIGNS AND SYMPTOMS °Chest pain can feel like: °· Burning or tingling on the surface of your chest or deep in your chest. °· Crushing, pressure, aching, or squeezing pain. °· Dull or sharp pain that is worse when you move, cough, or take a deep breath. °· Pain that is also felt in your back, neck, shoulder, or arm, or pain that spreads to any of these areas. °Your chest pain may come and go, or it may stay  constant. °DIAGNOSIS °Lab tests or other studies may be needed to find the cause of your pain. Your health care provider may have you take a test called an ambulatory ECG (electrocardiogram). An ECG records your heartbeat patterns at the time the test is performed. You may also have other tests, such as: °· Transthoracic echocardiogram (TTE). During echocardiography, sound waves are used to create a picture of all of the heart structures and to look at how blood flows through your heart. °· Transesophageal echocardiogram (TEE). This is a more advanced imaging test that obtains images from inside your body. It allows your health care provider to see your heart in finer detail. °· Cardiac monitoring. This allows your health care provider to monitor your heart rate and rhythm in real time. °· Holter monitor. This is a portable device that records your heartbeat and can help to diagnose abnormal heartbeats. It allows your health care provider to track your heart activity for several days, if needed. °· Stress tests. These can be done through exercise or by taking medicine that makes your heart beat more quickly. °· Blood tests. °· Imaging tests. °TREATMENT  °Your treatment depends on what is causing your chest pain. Treatment may include: °· Medicines. These may include: °¨ Acid blockers for heartburn. °¨ Anti-inflammatory medicine. °¨ Pain medicine for inflammatory conditions. °¨ Antibiotic medicine, if an infection is present. °¨ Medicines to dissolve blood clots. °¨ Medicines to treat coronary artery disease. °· Supportive care for conditions that do not require medicines. This may include: °¨ Resting. °¨ Applying heat   or cold packs to injured areas. °¨ Limiting activities until pain decreases. °HOME CARE INSTRUCTIONS °· If you were prescribed an antibiotic medicine, finish it all even if you start to feel better. °· Avoid any activities that bring on chest pain. °· Do not use any tobacco products, including  cigarettes, chewing tobacco, or electronic cigarettes. If you need help quitting, ask your health care provider. °· Do not drink alcohol. °· Take medicines only as directed by your health care provider. °· Keep all follow-up visits as directed by your health care provider. This is important. This includes any further testing if your chest pain does not go away. °· If heartburn is the cause for your chest pain, you may be told to keep your head raised (elevated) while sleeping. This reduces the chance that acid will go from your stomach into your esophagus. °· Make lifestyle changes as directed by your health care provider. These may include: °¨ Getting regular exercise. Ask your health care provider to suggest some activities that are safe for you. °¨ Eating a heart-healthy diet. A registered dietitian can help you to learn healthy eating options. °¨ Maintaining a healthy weight. °¨ Managing diabetes, if necessary. °¨ Reducing stress. °SEEK MEDICAL CARE IF: °· Your chest pain does not go away after treatment. °· You have a rash with blisters on your chest. °· You have a fever. °SEEK IMMEDIATE MEDICAL CARE IF:  °· Your chest pain is worse. °· You have an increasing cough, or you cough up blood. °· You have severe abdominal pain. °· You have severe weakness. °· You faint. °· You have chills. °· You have sudden, unexplained chest discomfort. °· You have sudden, unexplained discomfort in your arms, back, neck, or jaw. °· You have shortness of breath at any time. °· You suddenly start to sweat, or your skin gets clammy. °· You feel nauseous or you vomit. °· You suddenly feel light-headed or dizzy. °· Your heart begins to beat quickly, or it feels like it is skipping beats. °These symptoms may represent a serious problem that is an emergency. Do not wait to see if the symptoms will go away. Get medical help right away. Call your local emergency services (911 in the [place] Do not drive yourself to the hospital. °  °This  information is not intended to replace advice given to you by your health care provider. Make sure you discuss any questions you have with your health care provider. °  °Document Released: 09/05/2005 Document Revised: 12/17/2014 Document Reviewed: 07/02/2014 °Elsevier Interactive Patient Education ©2016 [employer] ° °

## 2016-05-17 NOTE — ED Notes (Signed)
Pt reports central cp since Sunday evening. Accompanied by intermittent sob, dizziness, nausea and HA. Pt reports BP has been higher than usual since Sunday.

## 2016-05-17 NOTE — ED Provider Notes (Signed)
CSN: [id number]     Arrival date & time 05/17/16  1749 History   First MD Initiated Contact with Patient 05/17/16 1846     Chief Complaint  Patient presents with  . Chest Pain     Patient is a 65 y.o. female presenting with chest pain. The history is provided by the patient. No language interpreter was used.  Chest Pain  Bridget Good is a 65 y.o. female who presents to the Emergency Department complaining of chest pain.  She reports generalized malaise and intermittent chest pain since Saturday. She has decreased energy, nausea, intermittent shortness of breath, dizziness. She has occasional cough. No fevers. She has bilateral lower extremity swelling. She has a history of hypertension, diabetes. No history of coronary artery disease or blood clot. She does not take any hormone medications. Her chest pain is described as a discomfort that lasts about a minute at a time. This sometimes worse with leaning forward.  Past Medical History  Diagnosis Date  . Hyperlipidemia   . Obesity (BMI 30-39.9)   . GERD (gastroesophageal reflux disease)   . Osteoarthritis   . Diabetes mellitus   . Hypertension   . Angina   . Hypertensive heart disease without CHF   . Pneumonia   . Bronchitis   . Lupus ([place])     "treated for it from 1992 til 2012; dr said I don't have it anymore"  . Shortness of breath     lying down  . Shortness of breath on exertion   . Headache(784.0)   . Fibromyalgia    Past Surgical History  Procedure Laterality Date  . Cesarean section  1978; 1981  . Foot surgery      "had to cut it to let the fluids out; it had swollen very badly; left foot"  . Abdominal hysterectomy      partial  . Cardiac catheterization  ~ 2007   Family History  Problem Relation Age of Onset  . Heart attack Father   . Diabetes Father   . Heart attack Brother   . Diabetes Sister   . Diabetes Sister    Social History  Substance Use Topics  . Smoking status: Never Smoker   . Smokeless  tobacco: Never Used  . Alcohol Use: No   OB History    No data available     Review of Systems  Cardiovascular: Positive for chest pain.  All other systems reviewed and are negative.     Allergies  Sulfa antibiotics  Home Medications   Prior to Admission medications   Medication Sig Start Date End Date Taking? Authorizing Provider  aspirin EC 81 MG tablet Take 81 mg by mouth daily.   Yes Historical Provider, MD  diltiazem (CARDIZEM CD) 240 MG 24 hr capsule Take 240 mg by mouth daily.   Yes Historical Provider, MD  glyBURIDE (DIABETA) 2.5 MG tablet Take 2.5 mg by mouth daily with breakfast.   Yes Historical Provider, MD  imipramine (TOFRANIL) 50 MG tablet Take 50 mg by mouth daily as needed (depression).  05/10/16  Yes Historical Provider, MD  KLOR-CON M20 20 MEQ tablet Take 20 mEq by mouth daily.  04/06/15  Yes Historical Provider, MD  metoprolol tartrate (LOPRESSOR) 25 MG tablet Take 25 mg by mouth daily.   Yes Historical Provider, MD  potassium chloride (KLOR-CON) 20 MEQ packet Take 20 mEq by mouth daily.   Yes Historical Provider, MD  prednisoLONE acetate (PRED FORTE) 1 % ophthalmic suspension Place 1  drop into both eyes daily.  05/28/15  Yes Historical Provider, MD  simvastatin (ZOCOR) 20 MG tablet Take 20 mg by mouth daily. 05/26/15  Yes Historical Provider, MD  valsartan-hydrochlorothiazide (DIOVAN-HCT) 320-25 MG per tablet Take 1 tablet by mouth daily.   Yes Historical Provider, MD  metoprolol tartrate (LOPRESSOR) 50 MG tablet Take 0.5 tablets (25 mg total) by mouth 2 (two) times daily. 01/02/12 05/26/15  Jacolyn Reedy, MD  metroNIDAZOLE (FLAGYL) 500 MG tablet Take 1 tablet (500 mg total) by mouth 2 (two) times daily. Patient not taking: Reported on 05/17/2016 07/01/15   Emily Filbert, MD  nitroGLYCERIN (NITROSTAT) 0.4 MG SL tablet Place 1 tablet (0.4 mg total) under the tongue every 5 (five) minutes as needed for chest pain. 01/02/12 05/26/15  Jacolyn Reedy, MD  pantoprazole  (PROTONIX) 40 MG tablet Take 1 tablet (40 mg total) by mouth daily at 12 noon. Patient taking differently: Take 40 mg by mouth daily as needed (for acid reflex).  01/02/12 05/26/15  Jacolyn Reedy, MD   BP 119/73 mmHg  Pulse 52  Temp(Src) 99 F (37.2 C) (Oral)  Resp 18  Ht 5\' 6"  (1.676 m)  Wt 179 lb (81.194 kg)  BMI 28.91 kg/m2  SpO2 100% Physical Exam  Constitutional: She is oriented to person, place, and time. She appears well-developed and well-nourished.  HENT:  Head: Normocephalic and atraumatic.  Cardiovascular: Normal rate and regular rhythm.   No murmur heard. Pulmonary/Chest: Effort normal and breath sounds normal. No respiratory distress.  Abdominal: Soft. There is no rebound and no guarding.  Mild diffuse abdominal tenderness  Musculoskeletal: She exhibits no tenderness.  Trace nonpitting edema of bilateral lower extremities  Neurological: She is alert and oriented to person, place, and time.  Skin: Skin is warm and dry.  Psychiatric: She has a normal mood and affect. Her behavior is normal.  Nursing note and vitals reviewed.   ED Course  Procedures (including critical care time) Labs Review Labs Reviewed  BASIC METABOLIC PANEL - Abnormal; Notable for the following:    Glucose, Bld 135 (*)    BUN 24 (*)    Creatinine, Ser 1.13 (*)    GFR calc non Af Amer 51 (*)    GFR calc Af Amer 59 (*)    All other components within normal limits  HEPATIC FUNCTION PANEL - Abnormal; Notable for the following:    Total Protein 8.3 (*)    ALT 13 (*)    Indirect Bilirubin 1.0 (*)    All other components within normal limits  CBC  LIPASE, BLOOD  D-DIMER, QUANTITATIVE (NOT AT [facility])  I-STAT TROPOININ, ED  Randolm Idol, ED    Imaging Review Dg Chest 2 View  05/17/2016  CLINICAL DATA:  65 year old female with chest pain EXAM: CHEST  2 VIEW COMPARISON:  Chest radiograph dated 02/01/2016 FINDINGS: Two views of the chest do not demonstrate a focal consolidation. There is no  pleural effusion or pneumothorax. The cardiac silhouette is within normal limits. There is degenerative changes of the spine. No acute osseous pathology. IMPRESSION: No active cardiopulmonary disease. Electronically Signed   By: Anner Crete M.D.   On: 05/17/2016 18:43   I have personally reviewed and evaluated these images and lab results as part of my medical decision-making.   EKG Interpretation   Date/Time:  Thursday May 17 2016 17:58:33 EDT Ventricular Rate:  62 PR Interval:  143 QRS Duration: 93 QT Interval:  377 QTC Calculation: 383 R Axis:   -  52 Text Interpretation:  Sinus rhythm LAD, consider left anterior fascicular  block Abnormal R-wave progression, late transition Borderline T  abnormalities, diffuse leads Confirmed by Hazle Coca [phone]) on 05/17/2016  6:49:56 PM      MDM   Final diagnoses:  Chest pain, unspecified chest pain type    Patient here for intermittent chest pain for the last 5 days. EKG with no significant changes compared to priors. She has been pain-free in the emergency department. Presentation not consistent with ACS, PE, dissection, CHF, pneumonia. Discussed with patient homecare, outpatient follow-up, return precautions.  Quintella Reichert, MD 05/18/16 1459

## 2016-12-12 ENCOUNTER — Other Ambulatory Visit: Payer: Self-pay | Admitting: Endocrinology

## 2016-12-12 DIAGNOSIS — R519 Headache, unspecified: Secondary | ICD-10-CM

## 2016-12-12 DIAGNOSIS — R51 Headache: Principal | ICD-10-CM

## 2016-12-17 ENCOUNTER — Ambulatory Visit
Admission: RE | Admit: 2016-12-17 | Discharge: 2016-12-17 | Disposition: A | Discharge status: Home / Self Care | Payer: BLUE CROSS/BLUE SHIELD | Source: Ambulatory Visit | Attending: Endocrinology | Admitting: Endocrinology

## 2016-12-17 DIAGNOSIS — R519 Headache, unspecified: Secondary | ICD-10-CM

## 2016-12-17 DIAGNOSIS — R51 Headache: Principal | ICD-10-CM

## 2016-12-17 MED ORDER — IOPAMIDOL (ISOVUE-300) INJECTION 61%
75.0000 mL | Freq: Once | INTRAVENOUS | Status: AC | PRN
  Administered 2016-12-17: 75 mL via INTRAVENOUS

## 2018-03-03 DIAGNOSIS — I10 Essential (primary) hypertension: Secondary | ICD-10-CM | POA: Diagnosis not present

## 2018-03-03 DIAGNOSIS — E119 Type 2 diabetes mellitus without complications: Secondary | ICD-10-CM | POA: Diagnosis not present

## 2018-03-03 DIAGNOSIS — E785 Hyperlipidemia, unspecified: Secondary | ICD-10-CM | POA: Diagnosis not present

## 2018-03-03 DIAGNOSIS — R9431 Abnormal electrocardiogram [ECG] [EKG]: Secondary | ICD-10-CM | POA: Diagnosis not present

## 2018-03-03 DIAGNOSIS — F419 Anxiety disorder, unspecified: Secondary | ICD-10-CM | POA: Diagnosis not present

## 2018-05-08 ENCOUNTER — Telehealth: Payer: Self-pay | Admitting: Neurology

## 2018-05-08 NOTE — Telephone Encounter (Signed)
Called the patient to inform her that Dr Brett Fairy will have to be out of the office on June 6th in the morning. I wanted to offer her on the same day and apt at 3:30 pm. I do have some other cancellations I can potentially offer the pt.  There was no answer. LVM for the pt and would like to try and reschedule her when she calls back.

## 2018-05-09 NOTE — Telephone Encounter (Signed)
Pt returned Rn's call. Appt moved to 3:30 on 6/6

## 2018-05-10 ENCOUNTER — Encounter (HOSPITAL_COMMUNITY): Payer: Self-pay | Admitting: Emergency Medicine

## 2018-05-10 ENCOUNTER — Emergency Department (HOSPITAL_COMMUNITY)
Admission: EM | Admit: 2018-05-10 | Discharge: 2018-05-10 | Disposition: A | Discharge status: Home / Self Care | Payer: PPO | Attending: Emergency Medicine | Admitting: Emergency Medicine

## 2018-05-10 ENCOUNTER — Other Ambulatory Visit: Payer: Self-pay

## 2018-05-10 ENCOUNTER — Emergency Department (HOSPITAL_COMMUNITY): Payer: PPO

## 2018-05-10 DIAGNOSIS — I2510 Atherosclerotic heart disease of native coronary artery without angina pectoris: Secondary | ICD-10-CM | POA: Diagnosis not present

## 2018-05-10 DIAGNOSIS — Z7982 Long term (current) use of aspirin: Secondary | ICD-10-CM | POA: Insufficient documentation

## 2018-05-10 DIAGNOSIS — R079 Chest pain, unspecified: Secondary | ICD-10-CM | POA: Diagnosis not present

## 2018-05-10 DIAGNOSIS — I10 Essential (primary) hypertension: Secondary | ICD-10-CM | POA: Diagnosis not present

## 2018-05-10 DIAGNOSIS — E119 Type 2 diabetes mellitus without complications: Secondary | ICD-10-CM | POA: Diagnosis not present

## 2018-05-10 DIAGNOSIS — Z79899 Other long term (current) drug therapy: Secondary | ICD-10-CM | POA: Diagnosis not present

## 2018-05-10 DIAGNOSIS — Z7984 Long term (current) use of oral hypoglycemic drugs: Secondary | ICD-10-CM | POA: Insufficient documentation

## 2018-05-10 LAB — BASIC METABOLIC PANEL
Anion gap: 11 (ref 5–15)
BUN: 9 mg/dL (ref 6–20)
CO2: 27 mmol/L (ref 22–32)
Calcium: 9.1 mg/dL (ref 8.9–10.3)
Chloride: 104 mmol/L (ref 101–111)
Creatinine, Ser: 1.05 mg/dL — ABNORMAL HIGH (ref 0.44–1.00)
GFR calc Af Amer: >60 mL/min (ref >60)
GFR calc non Af Amer: 55 mL/min — ABNORMAL LOW (ref >60)
Glucose, Bld: 119 mg/dL — ABNORMAL HIGH (ref 65–99)
Potassium: 3 mmol/L — ABNORMAL LOW (ref 3.5–5.1)
Sodium: 142 mmol/L (ref 135–145)

## 2018-05-10 LAB — CBC
HCT: 44.3 % (ref 36.0–46.0)
Hemoglobin: 14.2 g/dL (ref 12.0–15.0)
MCH: 28.7 pg (ref 26.0–34.0)
MCHC: 32.1 g/dL (ref 30.0–36.0)
MCV: 89.5 fL (ref 78.0–100.0)
Platelets: 283 10*3/uL (ref 150–400)
RBC: 4.95 MIL/uL (ref 3.87–5.11)
RDW: 13.5 % (ref 11.5–15.5)
WBC: 3.9 10*3/uL — ABNORMAL LOW (ref 4.0–10.5)

## 2018-05-10 LAB — I-STAT TROPONIN, ED
Troponin i, poc: 0.03 ng/mL (ref 0.00–0.08)
Troponin i, poc: 0 ng/mL (ref 0.00–0.08)

## 2018-05-10 MED ORDER — ASPIRIN EC 325 MG PO TBEC
325.0000 mg | DELAYED_RELEASE_TABLET | Freq: Once | ORAL | Status: AC
  Administered 2018-05-10: 325 mg via ORAL
  Filled 2018-05-10: qty 1

## 2018-05-10 MED ORDER — GI COCKTAIL ~~LOC~~
30.0000 mL | Freq: Once | ORAL | Status: AC
  Administered 2018-05-10: 30 mL via ORAL
  Filled 2018-05-10: qty 30

## 2018-05-10 MED ORDER — SODIUM CHLORIDE 0.9 % IV BOLUS
1000.0000 mL | Freq: Once | INTRAVENOUS | Status: AC
  Administered 2018-05-10: 1000 mL via INTRAVENOUS

## 2018-05-10 MED ORDER — NITROGLYCERIN 0.4 MG SL SUBL
0.4000 mg | SUBLINGUAL_TABLET | SUBLINGUAL | Status: DC | PRN
  Administered 2018-05-10 (×3): 0.4 mg via SUBLINGUAL
  Filled 2018-05-10: qty 1

## 2018-05-10 MED ORDER — PROMETHAZINE HCL 25 MG/ML IJ SOLN
12.5000 mg | Freq: Once | INTRAMUSCULAR | Status: AC
  Administered 2018-05-10: 12.5 mg via INTRAVENOUS
  Filled 2018-05-10: qty 1

## 2018-05-10 MED ORDER — PANTOPRAZOLE SODIUM 40 MG PO TBEC: 40.0000 mg | DELAYED_RELEASE_TABLET | Freq: Every day | ORAL | 12 refills | Status: DC

## 2018-05-10 MED ORDER — MORPHINE SULFATE (PF) 4 MG/ML IV SOLN
4.0000 mg | Freq: Once | INTRAVENOUS | Status: AC
  Administered 2018-05-10: 4 mg via INTRAVENOUS
  Filled 2018-05-10: qty 1

## 2018-05-10 MED ORDER — ONDANSETRON HCL 4 MG/2ML IJ SOLN
4.0000 mg | Freq: Once | INTRAMUSCULAR | Status: AC
  Administered 2018-05-10: 4 mg via INTRAVENOUS
  Filled 2018-05-10: qty 2

## 2018-05-10 MED ORDER — VALSARTAN-HYDROCHLOROTHIAZIDE 320-25 MG PO TABS: 1.0000 | ORAL_TABLET | Freq: Every day | ORAL | 0 refills | Status: DC

## 2018-05-10 MED ORDER — HYDRALAZINE HCL 20 MG/ML IJ SOLN
10.0000 mg | Freq: Once | INTRAMUSCULAR | Status: AC
  Administered 2018-05-10: 10 mg via INTRAVENOUS
  Filled 2018-05-10: qty 1

## 2018-05-10 MED ORDER — DILTIAZEM HCL ER COATED BEADS 240 MG PO CP24: 240.0000 mg | ORAL_CAPSULE | Freq: Every day | ORAL | 0 refills | Status: DC

## 2018-05-10 NOTE — ED Triage Notes (Signed)
Patient to ED c/o chest pain onset last night, intermittent, initially L sided but upon waking this morning felt it on the right side with intermittent dizziness and nausea. Patient also c/o headache. Patient very hypertensive in triage, but states she hasn't taken her medication today.

## 2018-05-10 NOTE — ED Notes (Signed)
Patient transported to X-ray 

## 2018-05-10 NOTE — ED Notes (Signed)
Pt back from X-ray.  

## 2018-05-10 NOTE — ED Provider Notes (Signed)
[facility] EMERGENCY DEPARTMENT Provider Note   CSN: [id number] Arrival date & time: 05/10/18  1111     History   Chief Complaint Chief Complaint  Patient presents with  . Chest Pain    HPI Bridget Good is a 67 y.o. female.  Pt presents to the ED today with CP.  She noticed it last night.  She noticed it again today and it has not gone away.  She has not taken any of her meds today.  She has been under a lot of stress.  She denies n/v or sob.  She has been a patient of Dr. Wilson Singer, but he retired.  She has not seen a pcp in over a year.     Past Medical History:  Diagnosis Date  . Angina   . Bronchitis   . Diabetes mellitus   . Fibromyalgia   . GERD (gastroesophageal reflux disease)   . Headache(784.0)   . Hyperlipidemia   . Hypertension   . Hypertensive heart disease without CHF   . Lupus ([place])    "treated for it from 1992 til 2012; dr said I don't have it anymore"  . Obesity (BMI 30-39.9)   . Osteoarthritis   . Pneumonia   . Shortness of breath    lying down  . Shortness of breath on exertion     Patient Active Problem List   Diagnosis Date Noted  . History of lupus ([place]) 01/02/2012  . CAD (coronary artery disease)   . Diabetes mellitus type 2, noninsulin dependent ([place])   . Hypertensive heart disease without CHF   . Hyperlipidemia   . Obesity (BMI 30-39.9)   . GERD (gastroesophageal reflux disease)   . Osteoarthritis     Past Surgical History:  Procedure Laterality Date  . ABDOMINAL HYSTERECTOMY     partial  . CARDIAC CATHETERIZATION  ~ 2007  . CESAREAN SECTION  1978; 1981  . FOOT SURGERY     "had to cut it to let the fluids out; it had swollen very badly; left foot"     OB History   None      Home Medications    Prior to Admission medications   Medication Sig Start Date End Date Taking? Authorizing Provider  aspirin EC 81 MG tablet Take 81 mg by mouth daily.   Yes [provider]  Empagliflozin-linaGLIPtin  10-5 MG TABS Take 1 tablet by mouth as needed (Diabetes).   Yes [provider]  diltiazem (CARDIZEM CD) 240 MG 24 hr capsule Take 1 capsule (240 mg total) by mouth daily. 05/10/18   Isla Pence, MD  glyBURIDE (DIABETA) 2.5 MG tablet Take 2.5 mg by mouth daily with breakfast.    [provider]  imipramine (TOFRANIL) 50 MG tablet Take 50 mg by mouth daily as needed (depression).  05/10/16   [provider]  KLOR-CON M20 20 MEQ tablet Take 20 mEq by mouth daily.  04/06/15   [provider]  metoprolol tartrate (LOPRESSOR) 25 MG tablet Take 25 mg by mouth daily.    [provider]  metoprolol tartrate (LOPRESSOR) 50 MG tablet Take 0.5 tablets (25 mg total) by mouth 2 (two) times daily. 01/02/12 05/26/15  Jacolyn Reedy, MD  metroNIDAZOLE (FLAGYL) 500 MG tablet Take 1 tablet (500 mg total) by mouth 2 (two) times daily. Patient not taking: Reported on 05/17/2016 07/01/15   Emily Filbert, MD  nitroGLYCERIN (NITROSTAT) 0.4 MG SL tablet Place 1 tablet (0.4 mg total)  under the tongue every 5 (five) minutes as needed for chest pain. 01/02/12 05/26/15  Jacolyn Reedy, MD  pantoprazole (PROTONIX) 40 MG tablet Take 1 tablet (40 mg total) by mouth daily at 12 noon. 05/10/18 10/01/21  Isla Pence, MD  potassium chloride (KLOR-CON) 20 MEQ packet Take 20 mEq by mouth daily.    [provider]  prednisoLONE acetate (PRED FORTE) 1 % ophthalmic suspension Place 1 drop into both eyes daily.  05/28/15   [provider]  rosuvastatin (CRESTOR) 20 MG tablet Take 20 mg by mouth daily.    [provider]  simvastatin (ZOCOR) 20 MG tablet Take 20 mg by mouth daily. 05/26/15   [provider]  valsartan-hydrochlorothiazide (DIOVAN-HCT) 320-25 MG tablet Take 1 tablet by mouth daily. 05/10/18   Isla Pence, MD    Family History Family History  Problem Relation Age of Onset  . Heart attack Father   . Diabetes Father   . Heart attack Brother     . Diabetes Sister   . Diabetes Sister     Social History Social History   Tobacco Use  . Smoking status: Never Smoker  . Smokeless tobacco: Never Used  Substance Use Topics  . Alcohol use: No    Alcohol/week: 0.0 oz  . Drug use: No     Allergies   Sulfa antibiotics   Review of Systems Review of Systems  Cardiovascular: Positive for chest pain.  All other systems reviewed and are negative.    Physical Exam Updated Vital Signs BP (!) 178/100   Pulse (!) 57   Temp 98.2 F (36.8 C) (Oral)   Resp (!) 22   SpO2 95%   Physical Exam  Constitutional: She is oriented to person, place, and time. She appears well-developed and well-nourished.  HENT:  Head: Normocephalic and atraumatic.  Eyes: Pupils are equal, round, and reactive to light. EOM are normal.  Neck: Normal range of motion. Neck supple.  Cardiovascular: Normal rate, regular rhythm, intact distal pulses and normal pulses.  Pulmonary/Chest: Effort normal and breath sounds normal.  Abdominal: Soft. Bowel sounds are normal.  Musculoskeletal: Normal range of motion.       Right lower leg: Normal.       Left lower leg: Normal.  Neurological: She is alert and oriented to person, place, and time.  Skin: Skin is warm. Capillary refill takes less than 2 seconds.  Psychiatric: She has a normal mood and affect. Her behavior is normal.  Nursing note and vitals reviewed.    ED Treatments / Results  Labs (all labs ordered are listed, but only abnormal results are displayed) Labs Reviewed  BASIC METABOLIC PANEL - Abnormal; Notable for the following components:      Result Value   Potassium 3.0 (*)    Glucose, Bld 119 (*)    Creatinine, Ser 1.05 (*)    GFR calc non Af Amer 55 (*)    All other components within normal limits  CBC - Abnormal; Notable for the following components:   WBC 3.9 (*)    All other components within normal limits  I-STAT TROPONIN, ED  I-STAT TROPONIN, ED    EKG EKG  Interpretation  Date/Time:  Saturday May 10 2018 13:56:03 EDT Ventricular Rate:  48 PR Interval:  132 QRS Duration: 109 QT Interval:  472 QTC Calculation: 422 R Axis:   106 Text Interpretation:  Atrial fibrillation Ventricular premature complex Right axis deviation Repol abnrm suggests ischemia, diffuse leads Since last tracing rate  slower Confirmed by Isla Pence [id number]) on 05/10/2018 2:00:04 PM   Radiology Dg Chest 2 View  Result Date: 05/10/2018 CLINICAL DATA:  Chest pain EXAM: CHEST - 2 VIEW COMPARISON:  05/17/2016 FINDINGS: Cardiac silhouette is normal in size. No mediastinal or hilar masses. No evidence of adenopathy. Clear lungs.  No pleural effusion or pneumothorax. Skeletal structures are intact. IMPRESSION: No active cardiopulmonary disease. Electronically Signed   By: Lajean Manes M.D.   On: 05/10/2018 11:53    Procedures Procedures (including critical care time)  Medications Ordered in ED Medications  nitroGLYCERIN (NITROSTAT) SL tablet 0.4 mg (0.4 mg Sublingual Given 05/10/18 1249)  gi cocktail (Maalox,Lidocaine,Donnatal) (has no administration in time range)  aspirin EC tablet 325 mg (325 mg Oral Given 05/10/18 1238)  ondansetron (ZOFRAN) injection 4 mg (4 mg Intravenous Given 05/10/18 1253)  hydrALAZINE (APRESOLINE) injection 10 mg (10 mg Intravenous Given 05/10/18 1334)  morphine 4 MG/ML injection 4 mg (4 mg Intravenous Given 05/10/18 1349)  promethazine (PHENERGAN) injection 12.5 mg (12.5 mg Intravenous Given 05/10/18 1347)  sodium chloride 0.9 % bolus 1,000 mL (0 mLs Intravenous Stopped 05/10/18 1542)     Initial Impression / Assessment and Plan / ED Course  I have reviewed the triage vital signs and the nursing notes.  Pertinent labs & imaging results that were available during my care of the patient were reviewed by me and considered in my medical decision making (see chart for details).    The pharmacy tech spoke with pt about her meds and she is not taking what she  has correctly.   CP is not typical.  2 negative troponins.  2 neg EKGs.  Pt's bp did drop with morphine, but has come back up with fluids.  I gave pt refill of cardizem, protonix, and diovan-hct.  As Dr. Wilson Singer was with [facility], she will be given their number to f/u.  Return if worse.  Final Clinical Impressions(s) / ED Diagnoses   Final diagnoses:  Chest pain, unspecified type  Essential hypertension    ED Discharge Orders        Ordered    diltiazem (CARDIZEM CD) 240 MG 24 hr capsule  Daily     05/10/18 1544    pantoprazole (PROTONIX) 40 MG tablet  Daily     05/10/18 1544    valsartan-hydrochlorothiazide (DIOVAN-HCT) 320-25 MG tablet  Daily     05/10/18 1544       Isla Pence, MD 05/10/18 1546

## 2018-05-13 ENCOUNTER — Encounter: Payer: Self-pay | Admitting: Neurology

## 2018-05-15 ENCOUNTER — Institutional Professional Consult (permissible substitution): Payer: PPO | Admitting: Neurology

## 2018-05-15 ENCOUNTER — Telehealth: Payer: Self-pay | Admitting: Neurology

## 2018-05-15 NOTE — Telephone Encounter (Signed)
Pt showed up for apt today at 12:30 for her 3:30 apt. They were unable to stay and left. Pt was not seen today

## 2018-05-27 ENCOUNTER — Encounter (HOSPITAL_COMMUNITY): Payer: Self-pay

## 2018-05-27 ENCOUNTER — Emergency Department (HOSPITAL_COMMUNITY): Payer: PPO

## 2018-05-27 ENCOUNTER — Other Ambulatory Visit: Payer: Self-pay

## 2018-05-27 ENCOUNTER — Emergency Department (HOSPITAL_COMMUNITY)
Admission: EM | Admit: 2018-05-27 | Discharge: 2018-05-27 | Disposition: A | Discharge status: Home / Self Care | Payer: PPO | Attending: Emergency Medicine | Admitting: Emergency Medicine

## 2018-05-27 DIAGNOSIS — R9431 Abnormal electrocardiogram [ECG] [EKG]: Secondary | ICD-10-CM | POA: Diagnosis not present

## 2018-05-27 DIAGNOSIS — Z7984 Long term (current) use of oral hypoglycemic drugs: Secondary | ICD-10-CM | POA: Diagnosis not present

## 2018-05-27 DIAGNOSIS — R079 Chest pain, unspecified: Secondary | ICD-10-CM | POA: Diagnosis not present

## 2018-05-27 DIAGNOSIS — G4489 Other headache syndrome: Secondary | ICD-10-CM | POA: Diagnosis not present

## 2018-05-27 DIAGNOSIS — Z7982 Long term (current) use of aspirin: Secondary | ICD-10-CM | POA: Diagnosis not present

## 2018-05-27 DIAGNOSIS — Z79899 Other long term (current) drug therapy: Secondary | ICD-10-CM | POA: Diagnosis not present

## 2018-05-27 DIAGNOSIS — R410 Disorientation, unspecified: Secondary | ICD-10-CM | POA: Diagnosis not present

## 2018-05-27 DIAGNOSIS — I10 Essential (primary) hypertension: Secondary | ICD-10-CM | POA: Insufficient documentation

## 2018-05-27 DIAGNOSIS — R509 Fever, unspecified: Secondary | ICD-10-CM | POA: Diagnosis not present

## 2018-05-27 DIAGNOSIS — R0602 Shortness of breath: Secondary | ICD-10-CM | POA: Diagnosis not present

## 2018-05-27 DIAGNOSIS — I2510 Atherosclerotic heart disease of native coronary artery without angina pectoris: Secondary | ICD-10-CM | POA: Diagnosis not present

## 2018-05-27 DIAGNOSIS — I499 Cardiac arrhythmia, unspecified: Secondary | ICD-10-CM | POA: Diagnosis not present

## 2018-05-27 DIAGNOSIS — E119 Type 2 diabetes mellitus without complications: Secondary | ICD-10-CM | POA: Insufficient documentation

## 2018-05-27 DIAGNOSIS — R0789 Other chest pain: Secondary | ICD-10-CM | POA: Diagnosis not present

## 2018-05-27 LAB — I-STAT TROPONIN, ED: Troponin i, poc: 0.01 ng/mL (ref 0.00–0.08)

## 2018-05-27 LAB — URINALYSIS, ROUTINE W REFLEX MICROSCOPIC
Bilirubin Urine: NEGATIVE
Glucose, UA: NEGATIVE mg/dL
Hgb urine dipstick: NEGATIVE
Ketones, ur: NEGATIVE mg/dL
Leukocytes, UA: NEGATIVE
Nitrite: NEGATIVE
Protein, ur: NEGATIVE mg/dL
Specific Gravity, Urine: 1.008 (ref 1.005–1.030)
pH: 6 (ref 5.0–8.0)

## 2018-05-27 LAB — BASIC METABOLIC PANEL
Anion gap: 11 (ref 5–15)
BUN: 14 mg/dL (ref 6–20)
CO2: 27 mmol/L (ref 22–32)
Calcium: 9.1 mg/dL (ref 8.9–10.3)
Chloride: 102 mmol/L (ref 101–111)
Creatinine, Ser: 1.23 mg/dL — ABNORMAL HIGH (ref 0.44–1.00)
GFR calc Af Amer: 52 mL/min — ABNORMAL LOW (ref >60)
GFR calc non Af Amer: 45 mL/min — ABNORMAL LOW (ref >60)
Glucose, Bld: 148 mg/dL — ABNORMAL HIGH (ref 65–99)
Potassium: 3.3 mmol/L — ABNORMAL LOW (ref 3.5–5.1)
Sodium: 140 mmol/L (ref 135–145)

## 2018-05-27 LAB — CBC
HCT: 42.4 % (ref 36.0–46.0)
Hemoglobin: 13.6 g/dL (ref 12.0–15.0)
MCH: 29.3 pg (ref 26.0–34.0)
MCHC: 32.1 g/dL (ref 30.0–36.0)
MCV: 91.4 fL (ref 78.0–100.0)
Platelets: 240 10*3/uL (ref 150–400)
RBC: 4.64 MIL/uL (ref 3.87–5.11)
RDW: 13.5 % (ref 11.5–15.5)
WBC: 10.6 10*3/uL — ABNORMAL HIGH (ref 4.0–10.5)

## 2018-05-27 MED ORDER — ACETAMINOPHEN 325 MG PO TABS
650.0000 mg | ORAL_TABLET | Freq: Once | ORAL | Status: AC
Start: 2018-05-27 — End: 2018-05-27
  Administered 2018-05-27: 650 mg via ORAL
  Filled 2018-05-27: qty 2

## 2018-05-27 NOTE — ED Notes (Signed)
Pt is unable to provide urine sample at this time. Pt is aware that a urine sample is needed. Will try again later.

## 2018-05-27 NOTE — Discharge Instructions (Addendum)
Take Tylenol every 4 hours as needed for chills or fever.  Make sure that you are getting plenty of rest, drinking a lot of fluids, and try to eat 3 meals each day.  Return here, or see your doctor, as needed for problems.

## 2018-05-27 NOTE — ED Notes (Signed)
Pt returned from xray

## 2018-05-27 NOTE — ED Triage Notes (Signed)
Pt comes via [facility] for CP that woke her up from her sleep, diaphoretic, with nausea, 7/10 pain, pt under a lot of stress at home. No radiation, has not been taking BP medications at home due money. PTA received 324 ASA and 3 nitro with relief of pain.

## 2018-05-27 NOTE — ED Provider Notes (Signed)
[facility] EMERGENCY DEPARTMENT Provider Note   CSN: [id number] Arrival date & time: 05/27/18  8119     History   Chief Complaint Chief Complaint  Patient presents with  . Chest Pain    HPI Bridget Good is a 67 y.o. female.  HPI  She presents for evaluation of achiness and chills.  Onset of symptoms during the night last night.  She has had malaise associated with "stress."  During transport she received 3 nitroglycerin sublingual and aspirin, for what was determined to be chest pain.  When asked the patient, she denied having chest pain.  Similar admission to the ED, 2-1/2 weeks ago at which time she had been out of some of her medication, which was prescribed.  She was directed to follow-up with your primary care provider.  She presents today with fever.  Patient did not know she was febrile.  She denies sneezing, coughing, sore throat, ear pain, dysuria or urinary frequency.  Family members report that she has been somewhat more confused recently in the last couple of months.  She did not take her medicines this morning, or eat breakfast.  She did apparently take her medications, as prescribed, yesterday.  No other recent difficulty with eating.  The patient has made a follow-up appointment with a new primary care provider to be seen next week.  There are no other known modifying factors.  Past Medical History:  Diagnosis Date  . Angina   . Bronchitis   . Diabetes mellitus   . Fibromyalgia   . GERD (gastroesophageal reflux disease)   . Headache(784.0)   . Hyperlipidemia   . Hypertension   . Hypertensive heart disease without CHF   . Lupus ([place])    "treated for it from 1992 til 2012; dr said I don't have it anymore"  . Obesity (BMI 30-39.9)   . Osteoarthritis   . Pneumonia   . Shortness of breath    lying down  . Shortness of breath on exertion     Patient Active Problem List   Diagnosis Date Noted  . History of lupus ([place]) 01/02/2012  . CAD  (coronary artery disease)   . Diabetes mellitus type 2, noninsulin dependent ([place])   . Hypertensive heart disease without CHF   . Hyperlipidemia   . Obesity (BMI 30-39.9)   . GERD (gastroesophageal reflux disease)   . Osteoarthritis     Past Surgical History:  Procedure Laterality Date  . ABDOMINAL HYSTERECTOMY     partial  . CARDIAC CATHETERIZATION  ~ 2007  . CESAREAN SECTION  1978; 1981  . FOOT SURGERY     "had to cut it to let the fluids out; it had swollen very badly; left foot"     OB History   None      Home Medications    Prior to Admission medications   Medication Sig Start Date End Date Taking? Authorizing Provider  aspirin EC 81 MG tablet Take 81 mg by mouth daily as needed for mild pain.    Yes [provider]  diltiazem (CARDIZEM CD) 240 MG 24 hr capsule Take 1 capsule (240 mg total) by mouth daily. 05/10/18  Yes Isla Pence, MD  Empagliflozin-linaGLIPtin 10-5 MG TABS Take 1 tablet by mouth as needed (Diabetes).   Yes [provider]  imipramine (TOFRANIL) 50 MG tablet Take 50 mg by mouth daily as needed (depression).  05/10/16  Yes [provider]  KLOR-CON M20 20 MEQ tablet Take 20  mEq by mouth daily.  04/06/15  Yes [provider]  metoprolol tartrate (LOPRESSOR) 25 MG tablet Take 25 mg by mouth daily as needed (high bp).    Yes [provider]  nitroGLYCERIN (NITROSTAT) 0.4 MG SL tablet Place 1 tablet (0.4 mg total) under the tongue every 5 (five) minutes as needed for chest pain. 01/02/12 05/27/18 Yes Jacolyn Reedy, MD  pantoprazole (PROTONIX) 40 MG tablet Take 1 tablet (40 mg total) by mouth daily at 12 noon. 05/10/18 10/01/21 Yes Isla Pence, MD  simvastatin (ZOCOR) 20 MG tablet Take 20 mg by mouth daily. 05/26/15  Yes [provider]  valsartan-hydrochlorothiazide (DIOVAN-HCT) 320-25 MG tablet Take 1 tablet by mouth daily. 05/10/18  Yes Isla Pence, MD    Family History Family History  Problem  Relation Age of Onset  . Heart attack Father   . Diabetes Father   . Heart attack Brother   . Diabetes Sister   . Diabetes Sister     Social History Social History   Tobacco Use  . Smoking status: Never Smoker  . Smokeless tobacco: Never Used  Substance Use Topics  . Alcohol use: No    Alcohol/week: 0.0 oz  . Drug use: No     Allergies   Sulfa antibiotics   Review of Systems Review of Systems  All other systems reviewed and are negative.    Physical Exam Updated Vital Signs BP (!) 148/102   Pulse 97   Temp 99.6 F (37.6 C) (Rectal)   Resp 17   Ht 5\' 3"  (1.6 m)   Wt 61.2 kg (135 lb)   SpO2 97%   BMI 23.91 kg/m   Physical Exam  Constitutional: She is oriented to person, place, and time. She appears well-developed. She does not appear ill. No distress.  Elderly, frail  HENT:  Head: Normocephalic and atraumatic.  Eyes: Pupils are equal, round, and reactive to light. Conjunctivae and EOM are normal.  Neck: Normal range of motion and phonation normal. Neck supple.  Cardiovascular: Normal rate and regular rhythm.  Pulmonary/Chest: Effort normal and breath sounds normal. She exhibits no tenderness.  Abdominal: Soft. She exhibits no distension. There is no tenderness. There is no guarding.  Musculoskeletal: Normal range of motion.  Neurological: She is alert and oriented to person, place, and time. She exhibits normal muscle tone.  No dysarthria or aphasia.  She is somewhat confused and defers answering some questions, to let family members respond.  She is here in the ED with her husband and son.  Skin: Skin is warm and dry.  Psychiatric: She has a normal mood and affect. Her behavior is normal.  She appears somewhat distracted.  There is no internal responsiveness.  Nursing note and vitals reviewed.    ED Treatments / Results  Labs (all labs ordered are listed, but only abnormal results are displayed) Labs Reviewed  BASIC METABOLIC PANEL - Abnormal;  Notable for the following components:      Result Value   Potassium 3.3 (*)    Glucose, Bld 148 (*)    Creatinine, Ser 1.23 (*)    GFR calc non Af Amer 45 (*)    GFR calc Af Amer 52 (*)    All other components within normal limits  CBC - Abnormal; Notable for the following components:   WBC 10.6 (*)    All other components within normal limits  URINALYSIS, ROUTINE W REFLEX MICROSCOPIC - Abnormal; Notable for the following components:   Color, Urine  STRAW (*)    All other components within normal limits  I-STAT TROPONIN, ED    EKG EKG Interpretation  Date/Time:  Tuesday May 27 2018 07:11:42 EDT Ventricular Rate:  104 PR Interval:    QRS Duration: 85 QT Interval:  408 QTC Calculation: 537 R Axis:   -62 Text Interpretation:  Sinus tachycardia Left anterior fascicular block Abnormal R-wave progression, early transition Consider left ventricular hypertrophy Borderline T abnormalities, lateral leads Prolonged QT interval Since last tracing rate faster and AF resolved Confirmed by Daleen Bo [phone]) on 05/27/2018 7:27:59 AM Also confirmed by Daleen Bo [phone]), editor Philomena Doheny [phone])  on 05/27/2018 7:36:09 AM   Radiology Dg Chest 2 View  Result Date: 05/27/2018 CLINICAL DATA:  Chest pain and shortness of breath EXAM: CHEST - 2 VIEW COMPARISON:  May 10, 2018 FINDINGS: There is no edema or consolidation. The heart size and pulmonary vascularity are normal. No adenopathy. There is mild degenerative change in the thoracic spine. IMPRESSION: No edema or consolidation. Electronically Signed   By: Lowella Grip III M.D.   On: 05/27/2018 07:54    Procedures Procedures (including critical care time)  Medications Ordered in ED Medications  acetaminophen (TYLENOL) tablet 650 mg (650 mg Oral Given 05/27/18 0751)     Initial Impression / Assessment and Plan / ED Course  I have reviewed the triage vital signs and the nursing notes.  Pertinent labs & imaging results that  were available during my care of the patient were reviewed by me and considered in my medical decision making (see chart for details).  Clinical Course as of May 28 1111  Tue May 27, 2018  1046 No acute disease, images reviewed  DG Chest 2 View [EW]  1047 Normal  Urinalysis, Routine w reflex microscopic(!) [EW]  1048 Normal  I-stat troponin, ED [EW]  1048 Normal except potassium low, glucose high, creatinine high  Basic metabolic panel(!) [EW]  2202 Normal except mild white blood count elevation  CBC(!) [EW]    Clinical Course User Index [EW] Daleen Bo, MD     Patient Vitals for the past 24 hrs:  BP Temp Temp src Pulse Resp SpO2 Height Weight  05/27/18 1057 - 99.6 F (37.6 C) Rectal - - - - -  05/27/18 1045 (!) 148/102 - - 97 17 97 % - -  05/27/18 1030 (!) 152/92 - - 80 (!) 23 97 % - -  05/27/18 1015 (!) 142/88 - - 89 14 93 % - -  05/27/18 1000 (!) 146/89 - - 82 (!) 24 93 % - -  05/27/18 0941 (!) 142/82 - - 89 12 96 % - -  05/27/18 0930 - - - 96 20 98 % - -  05/27/18 0845 (!) 163/98 - - 99 (!) 23 96 % - -  05/27/18 0830 (!) 165/95 - - (!) 102 17 99 % - -  05/27/18 0815 (!) 170/96 - - 91 (!) 22 100 % - -  05/27/18 0800 (!) 171/93 - - 94 17 99 % - -  05/27/18 0745 (!) 161/92 - - (!) 104 (!) 24 100 % - -  05/27/18 0731 - - - - - - 5\' 3"  (1.6 m) 61.2 kg (135 lb)  05/27/18 0730 (!) 155/95 - - (!) 103 16 97 % - -  05/27/18 0715 (!) 152/94 - - (!) 101 15 97 % - -  05/27/18 0700 (!) 160/97 (!) 103 F (39.4 C) Oral (!) 112 (!) 21 96 % - -  11:12 AM Reevaluation with update and discussion. After initial assessment and treatment, an updated evaluation reveals she is alert and comfortable, she has defervesced.  And is discussed with the patient, and all questions were answered. Daleen Bo   Medical Decision Making: Chills, with fever.  Nonspecific symptoms, and reassuring evaluation.  Mild hypertension, currently under treatment.  Doubt severe bacterial infection, metabolic  instability or impending vascular collapse.  CRITICAL CARE-no Performed by: Daleen Bo   Nursing Notes Reviewed/ Care Coordinated Applicable Imaging Reviewed Interpretation of Laboratory Data incorporated into ED treatment  The patient appears reasonably screened and/or stabilized for discharge and I doubt any other medical condition or other [facility] requiring further screening, evaluation, or treatment in the ED at this time prior to discharge.  Plan: Home Medications-APAP for fever, continue current medications; Home Treatments-rest, fluids; return here if the recommended treatment, does not improve the symptoms; Recommended follow up-PCP, prn    Final Clinical Impressions(s) / ED Diagnoses   Final diagnoses:  Acute febrile illness    ED Discharge Orders    None       Daleen Bo, MD 05/27/18 1114

## 2018-06-05 DIAGNOSIS — R079 Chest pain, unspecified: Secondary | ICD-10-CM | POA: Diagnosis not present

## 2018-06-05 DIAGNOSIS — Z1389 Encounter for screening for other disorder: Secondary | ICD-10-CM | POA: Diagnosis not present

## 2018-06-05 DIAGNOSIS — E119 Type 2 diabetes mellitus without complications: Secondary | ICD-10-CM | POA: Diagnosis not present

## 2018-06-05 DIAGNOSIS — E785 Hyperlipidemia, unspecified: Secondary | ICD-10-CM | POA: Diagnosis not present

## 2018-06-05 DIAGNOSIS — I10 Essential (primary) hypertension: Secondary | ICD-10-CM | POA: Diagnosis not present

## 2018-06-17 ENCOUNTER — Telehealth: Payer: Self-pay

## 2018-06-17 NOTE — Telephone Encounter (Signed)
SENT REFERRAL TO SCHEDULING AND FILED NOTES 

## 2018-06-25 LAB — HM DIABETES EYE EXAM

## 2018-06-26 DIAGNOSIS — E113412 Type 2 diabetes mellitus with severe nonproliferative diabetic retinopathy with macular edema, left eye: Secondary | ICD-10-CM | POA: Diagnosis not present

## 2018-06-26 DIAGNOSIS — H2511 Age-related nuclear cataract, right eye: Secondary | ICD-10-CM | POA: Diagnosis not present

## 2018-06-26 DIAGNOSIS — H35032 Hypertensive retinopathy, left eye: Secondary | ICD-10-CM | POA: Diagnosis not present

## 2018-06-26 DIAGNOSIS — E113391 Type 2 diabetes mellitus with moderate nonproliferative diabetic retinopathy without macular edema, right eye: Secondary | ICD-10-CM | POA: Diagnosis not present

## 2018-06-30 DIAGNOSIS — H35032 Hypertensive retinopathy, left eye: Secondary | ICD-10-CM | POA: Diagnosis not present

## 2018-06-30 DIAGNOSIS — E113391 Type 2 diabetes mellitus with moderate nonproliferative diabetic retinopathy without macular edema, right eye: Secondary | ICD-10-CM | POA: Diagnosis not present

## 2018-06-30 DIAGNOSIS — E113412 Type 2 diabetes mellitus with severe nonproliferative diabetic retinopathy with macular edema, left eye: Secondary | ICD-10-CM | POA: Diagnosis not present

## 2018-07-10 ENCOUNTER — Ambulatory Visit (INDEPENDENT_AMBULATORY_CARE_PROVIDER_SITE_OTHER): Payer: PPO | Admitting: Neurology

## 2018-07-10 ENCOUNTER — Encounter: Payer: Self-pay | Admitting: Neurology

## 2018-07-10 ENCOUNTER — Encounter

## 2018-07-10 VITALS — BP 133/77 | HR 76 | Ht 64.0 in | Wt 165.0 lb

## 2018-07-10 DIAGNOSIS — F5104 Psychophysiologic insomnia: Secondary | ICD-10-CM | POA: Insufficient documentation

## 2018-07-10 DIAGNOSIS — R0683 Snoring: Secondary | ICD-10-CM | POA: Diagnosis not present

## 2018-07-10 DIAGNOSIS — M2619 Other specified anomalies of jaw-cranial base relationship: Secondary | ICD-10-CM

## 2018-07-10 NOTE — Patient Instructions (Signed)

## 2018-07-10 NOTE — Progress Notes (Signed)
SLEEP MEDICINE CLINIC   Provider:  Larey Seat, M D  Primary Care Physician:  [name]   Referring Provider: [name], FNP   Chief Complaint  Patient presents with  . New Patient (Initial Visit)    pt with husband,rm 10.  can't sleep well at night, noted more memory problems and feels irritable from not sleeping. She snores and wakes up gasping for air.     HPI:  Bridget Good is a 67 y.o. female patient, seen here in a referral from N.P. Laurance Flatten for a sleep apnea evaluation.   Mrs. Bridget Good is a 67 year old right-handed African-American female patient seen here in the presence of her husband.  She used to see Dr. Romana Juniper for many years until he retired and has now established primary care with tried another medicine.  Her past medical history is positive for hypertension, hyperlipidemia and diabetes mellitus.  She also has complaints about anxiety headaches and recently developed less restorative sleep and problems with insomnia. She feels under a lot of stress. Her husband is retired- the couple now runs a Animal nutritionist business. Her husband had resigned after 25 years from a church, after a conflict with the leadership.    Sleep habits are as follows:  Dinner time is around 6 -7 Pm, and working through books and fianances of there company, often watching TV. Sometimes she sleeps on the sofa.   Bed time is around 11.30 after the late news. She takes night time medication- Tylenol or ibuprofen.  The bedroom is relatively quiet, dark and cool. She sleeps on her back and on 2 pillows, she snores, intermittently. Nocturia once at the most.  Cannot recall dreaming at all. She wakes spontaneously between 7 and 8 and rises. Her knee is stiff.  Some morning she wakes with headaches, not from headaches.  Rare naps. Less than 7 hours of nocturnal sleep.    Sleep medical history and family sleep history: NP moore has ordered a rheumatological panel, RA factor was positive at  15.6 u/ml. Vit D deficiency. Chol 211, creatinine above 1.    Social history:  Non smoker, non drinker, caffeine use - iced tea and sodas- up to 2 glasses a day.   Review of Systems: Out of a complete 14 system review, the patient complains of only the following symptoms, and all other reviewed systems are negative.  Insomnia, snoring.   Epworth score  12/ 24  , Fatigue severity score 29/ 63   , depression score N/a    Social History   Socioeconomic History  . Marital status: Married    Spouse name: Not on file  . Number of children: Not on file  . Years of education: Not on file  . Highest education level: Not on file  Occupational History  . Not on file  Social Needs  . Financial resource strain: Not on file  . Food insecurity:    Worry: Not on file    Inability: Not on file  . Transportation needs:    Medical: Not on file    Non-medical: Not on file  Tobacco Use  . Smoking status: Never Smoker  . Smokeless tobacco: Never Used  Substance and Sexual Activity  . Alcohol use: No    Alcohol/week: 0.0 oz  . Drug use: No  . Sexual activity: Yes    Partners: Male    Birth control/protection: Surgical  Lifestyle  . Physical activity:    Days per week: Not on file  Minutes per session: Not on file  . Stress: Not on file  Relationships  . Social connections:    Talks on phone: Not on file    Gets together: Not on file    Attends religious service: Not on file    Active member of club or organization: Not on file    Attends meetings of clubs or organizations: Not on file    Relationship status: Not on file  . Intimate partner violence:    Fear of current or ex partner: Not on file    Emotionally abused: Not on file    Physically abused: Not on file    Forced sexual activity: Not on file  Other Topics Concern  . Not on file  Social History Narrative   Married.  Husband is psychotherapist.  Several children.  Husband is Theme park manager of [employer] in [place].     Family History  Problem Relation Age of Onset  . Heart attack Father   . Diabetes Father   . Hypertension Mother   . Heart attack Brother   . Kidney failure Brother   . Diabetes Sister   . Diabetes Sister     Past Medical History:  Diagnosis Date  . Angina   . Bronchitis   . Diabetes mellitus   . Fibromyalgia   . GERD (gastroesophageal reflux disease)   . Headache(784.0)   . Hyperlipidemia   . Hypertension   . Hypertensive heart disease without CHF   . Lupus ([place])    "treated for it from 1992 til 2012; dr said I don't have it anymore"  . Obesity (BMI 30-39.9)   . Osteoarthritis   . Pneumonia   . Shortness of breath    lying down  . Shortness of breath on exertion     Past Surgical History:  Procedure Laterality Date  . ABDOMINAL HYSTERECTOMY     partial  . CARDIAC CATHETERIZATION  ~ 2007  . CESAREAN SECTION  1978; 1981  . COLONOSCOPY    . FOOT SURGERY     "had to cut it to let the fluids out; it had swollen very badly; left foot"    Current Outpatient Medications  Medication Sig Dispense Refill  . aspirin EC 81 MG tablet Take 81 mg by mouth daily as needed for mild pain.     Marland Kitchen diltiazem (CARDIZEM CD) 240 MG 24 hr capsule Take 1 capsule (240 mg total) by mouth daily. 30 capsule 0  . KLOR-CON M20 20 MEQ tablet Take 20 mEq by mouth daily.   0  . pantoprazole (PROTONIX) 40 MG tablet Take 1 tablet (40 mg total) by mouth daily at 12 noon. 30 tablet 12  . rosuvastatin (CRESTOR) 10 MG tablet Take 10 mg by mouth daily.    . valsartan-hydrochlorothiazide (DIOVAN-HCT) 320-25 MG tablet Take 1 tablet by mouth daily. 30 tablet 0  . nitroGLYCERIN (NITROSTAT) 0.4 MG SL tablet Place 1 tablet (0.4 mg total) under the tongue every 5 (five) minutes as needed for chest pain. 25 tablet 12   No current facility-administered medications for this visit.     Allergies as of 07/10/2018 - Review Complete 07/10/2018  Allergen Reaction Noted  . Sulfa antibiotics Hives, Itching, and  Nausea And Vomiting 12/30/2011    Vitals: BP 133/77   Pulse 76   Ht 5\' 4"  (1.626 m)   Wt 165 lb (74.8 kg)   BMI 28.32 kg/m  Last Weight:  Wt Readings from Last 1 Encounters:  07/10/18 165  lb (74.8 kg)   ZGY:FVCB mass index is 28.32 kg/m.     Last Height:   Ht Readings from Last 1 Encounters:  07/10/18 5\' 4"  (1.626 m)    Physical exam:  General: The patient is awake, alert and appears not in acute distress. The patient is well groomed. Head: Normocephalic, atraumatic. Neck is supple. Mallampati 4,  neck circumference:14. Nasal airflow patent ,Retrognathia is strongly seen.  Cardiovascular:  Regular rate and rhythm, without  murmurs or carotid bruit, and without distended neck veins. Respiratory: Lungs are clear to auscultation. Skin:  Without evidence of edema, or rash Trunk: BMI is 28.3. The patient's posture is erect   Neurologic exam : The patient is awake and alert, oriented to place and time.   Memory subjective described as intact.  Attention span & concentration ability appears normal.  Speech is fluent, without dysarthria, dysphonia or aphasia.  Mood and affect are appropriate.  Cranial nerves: Pupils are equal and briskly reactive to light.  Funduscopic exam without evidence of pallor or edema.  Extraocular movements  in vertical and horizontal planes intact and without nystagmus. Visual fields by finger perimetry are intact. Hearing to finger rub intact.   Facial sensation intact to fine touch.  Facial motor strength is symmetric and tongue and uvula move midline. Shoulder shrug was symmetrical.   Motor exam:  Normal tone, muscle bulk and symmetric strength in all extremities. Weak grip.  Sensory:  Fine touch, pinprick and vibration were tested in all extremities. Proprioception tested in the upper extremities was normal. Coordination: handwriting changes. Finger-to-nose maneuver  normal without evidence of ataxia, dysmetria or tremor. Gait and station:  Patient walks without assistive device and is able unassisted to climb up to the exam table. Strength within normal limits.  Stance is stable and normal. Turns with 3 Steps.   Deep tendon reflexes: in the upper and lower extremities are symmetric and intact.  Assessment:  After physical and neurologic examination, review of laboratory studies,  Personal review of imaging studies, reports of other /same  Imaging studies, results of polysomnography and / or neurophysiology testing and pre-existing records as far as provided in visit., my assessment is   1) insomnia may be related to some lack of routines. Set a bedtime , set a rise time and all electronics off 30 minutes before bedtime.   2) anxiety-  Turns the clocks around, keep cool, quiet and dark. Read in a book or magazine.  3) snoring- and hypersomnia- screen for sleep apnea.    The patient was advised of the nature of the diagnosed disorder , the treatment options and the  risks for general health and wellness arising from not treating the condition.   I spent more than 40 minutes of face to face time with the patient.  Greater than 50% of time was spent in counseling and coordination of care. We have discussed the diagnosis and differential and I answered the patient's questions.    Plan:  Treatment plan and additional workup :  Split night PSG at [facility], SPLIT at AHI 20,  Recommend melatonin 5 mg or less.     Larey Seat, MD 03/13/9674, 9:16 PM  Certified in Neurology by ABPN Certified in [place] by [facility] [address] [place], [place] [address]

## 2018-07-11 ENCOUNTER — Other Ambulatory Visit: Payer: Self-pay

## 2018-07-11 ENCOUNTER — Emergency Department (HOSPITAL_COMMUNITY): Payer: PPO

## 2018-07-11 ENCOUNTER — Inpatient Hospital Stay (HOSPITAL_COMMUNITY)
Admission: EM | Admit: 2018-07-11 | Discharge: 2018-07-13 | DRG: 065 | Disposition: A | Discharge status: Home / Self Care | Payer: PPO | Attending: Family Medicine | Admitting: Family Medicine

## 2018-07-11 ENCOUNTER — Encounter (HOSPITAL_COMMUNITY): Payer: Self-pay | Admitting: Emergency Medicine

## 2018-07-11 ENCOUNTER — Inpatient Hospital Stay (HOSPITAL_COMMUNITY): Payer: PPO

## 2018-07-11 DIAGNOSIS — R9431 Abnormal electrocardiogram [ECG] [EKG]: Secondary | ICD-10-CM | POA: Diagnosis not present

## 2018-07-11 DIAGNOSIS — R402142 Coma scale, eyes open, spontaneous, at arrival to emergency department: Secondary | ICD-10-CM | POA: Diagnosis not present

## 2018-07-11 DIAGNOSIS — N179 Acute kidney failure, unspecified: Secondary | ICD-10-CM | POA: Diagnosis present

## 2018-07-11 DIAGNOSIS — R29700 NIHSS score 0: Secondary | ICD-10-CM | POA: Diagnosis present

## 2018-07-11 DIAGNOSIS — I6330 Cerebral infarction due to thrombosis of unspecified cerebral artery: Secondary | ICD-10-CM

## 2018-07-11 DIAGNOSIS — R072 Precordial pain: Secondary | ICD-10-CM | POA: Diagnosis not present

## 2018-07-11 DIAGNOSIS — M899 Disorder of bone, unspecified: Secondary | ICD-10-CM | POA: Diagnosis not present

## 2018-07-11 DIAGNOSIS — M797 Fibromyalgia: Secondary | ICD-10-CM | POA: Diagnosis not present

## 2018-07-11 DIAGNOSIS — R079 Chest pain, unspecified: Secondary | ICD-10-CM | POA: Diagnosis present

## 2018-07-11 DIAGNOSIS — I10 Essential (primary) hypertension: Secondary | ICD-10-CM | POA: Diagnosis not present

## 2018-07-11 DIAGNOSIS — G4489 Other headache syndrome: Secondary | ICD-10-CM | POA: Diagnosis not present

## 2018-07-11 DIAGNOSIS — Z7984 Long term (current) use of oral hypoglycemic drugs: Secondary | ICD-10-CM

## 2018-07-11 DIAGNOSIS — K219 Gastro-esophageal reflux disease without esophagitis: Secondary | ICD-10-CM | POA: Diagnosis not present

## 2018-07-11 DIAGNOSIS — E669 Obesity, unspecified: Secondary | ICD-10-CM | POA: Diagnosis present

## 2018-07-11 DIAGNOSIS — Z8249 Family history of ischemic heart disease and other diseases of the circulatory system: Secondary | ICD-10-CM

## 2018-07-11 DIAGNOSIS — R402362 Coma scale, best motor response, obeys commands, at arrival to emergency department: Secondary | ICD-10-CM | POA: Diagnosis present

## 2018-07-11 DIAGNOSIS — H539 Unspecified visual disturbance: Secondary | ICD-10-CM | POA: Diagnosis not present

## 2018-07-11 DIAGNOSIS — H4901 Third [oculomotor] nerve palsy, right eye: Secondary | ICD-10-CM | POA: Diagnosis not present

## 2018-07-11 DIAGNOSIS — I119 Hypertensive heart disease without heart failure: Secondary | ICD-10-CM | POA: Diagnosis present

## 2018-07-11 DIAGNOSIS — H538 Other visual disturbances: Secondary | ICD-10-CM | POA: Diagnosis not present

## 2018-07-11 DIAGNOSIS — E1122 Type 2 diabetes mellitus with diabetic chronic kidney disease: Secondary | ICD-10-CM | POA: Diagnosis present

## 2018-07-11 DIAGNOSIS — Z79899 Other long term (current) drug therapy: Secondary | ICD-10-CM | POA: Diagnosis not present

## 2018-07-11 DIAGNOSIS — N183 Chronic kidney disease, stage 3 (moderate): Secondary | ICD-10-CM | POA: Diagnosis not present

## 2018-07-11 DIAGNOSIS — E785 Hyperlipidemia, unspecified: Secondary | ICD-10-CM | POA: Diagnosis not present

## 2018-07-11 DIAGNOSIS — E119 Type 2 diabetes mellitus without complications: Secondary | ICD-10-CM | POA: Diagnosis not present

## 2018-07-11 DIAGNOSIS — I2510 Atherosclerotic heart disease of native coronary artery without angina pectoris: Secondary | ICD-10-CM | POA: Diagnosis not present

## 2018-07-11 DIAGNOSIS — R402252 Coma scale, best verbal response, oriented, at arrival to emergency department: Secondary | ICD-10-CM | POA: Diagnosis present

## 2018-07-11 DIAGNOSIS — H02401 Unspecified ptosis of right eyelid: Secondary | ICD-10-CM | POA: Diagnosis present

## 2018-07-11 DIAGNOSIS — I361 Nonrheumatic tricuspid (valve) insufficiency: Secondary | ICD-10-CM | POA: Diagnosis not present

## 2018-07-11 DIAGNOSIS — I639 Cerebral infarction, unspecified: Secondary | ICD-10-CM | POA: Diagnosis not present

## 2018-07-11 DIAGNOSIS — I129 Hypertensive chronic kidney disease with stage 1 through stage 4 chronic kidney disease, or unspecified chronic kidney disease: Secondary | ICD-10-CM | POA: Diagnosis present

## 2018-07-11 DIAGNOSIS — H532 Diplopia: Secondary | ICD-10-CM | POA: Diagnosis not present

## 2018-07-11 DIAGNOSIS — I6381 Other cerebral infarction due to occlusion or stenosis of small artery: Secondary | ICD-10-CM | POA: Diagnosis not present

## 2018-07-11 DIAGNOSIS — E1169 Type 2 diabetes mellitus with other specified complication: Secondary | ICD-10-CM

## 2018-07-11 DIAGNOSIS — R0789 Other chest pain: Secondary | ICD-10-CM | POA: Diagnosis not present

## 2018-07-11 DIAGNOSIS — Z7982 Long term (current) use of aspirin: Secondary | ICD-10-CM | POA: Diagnosis not present

## 2018-07-11 DIAGNOSIS — E782 Mixed hyperlipidemia: Secondary | ICD-10-CM | POA: Diagnosis present

## 2018-07-11 DIAGNOSIS — I2583 Coronary atherosclerosis due to lipid rich plaque: Secondary | ICD-10-CM | POA: Diagnosis not present

## 2018-07-11 LAB — COMPREHENSIVE METABOLIC PANEL
ALT: 16 U/L (ref 0–44)
AST: 20 U/L (ref 15–41)
Albumin: 3.4 g/dL — ABNORMAL LOW (ref 3.5–5.0)
Alkaline Phosphatase: 74 U/L (ref 38–126)
Anion gap: 9 (ref 5–15)
BUN: 15 mg/dL (ref 8–23)
CO2: 27 mmol/L (ref 22–32)
Calcium: 9 mg/dL (ref 8.9–10.3)
Chloride: 106 mmol/L (ref 98–111)
Creatinine, Ser: 1.35 mg/dL — ABNORMAL HIGH (ref 0.44–1.00)
GFR calc Af Amer: 47 mL/min — ABNORMAL LOW (ref >60)
GFR calc non Af Amer: 40 mL/min — ABNORMAL LOW (ref >60)
Glucose, Bld: 145 mg/dL — ABNORMAL HIGH (ref 70–99)
Potassium: 3.5 mmol/L (ref 3.5–5.1)
Sodium: 142 mmol/L (ref 135–145)
Total Bilirubin: 1.1 mg/dL (ref 0.3–1.2)
Total Protein: 6.2 g/dL — ABNORMAL LOW (ref 6.5–8.1)

## 2018-07-11 LAB — HEPARIN LEVEL (UNFRACTIONATED): Heparin Unfractionated: 0.6 IU/mL (ref 0.30–0.70)

## 2018-07-11 LAB — I-STAT TROPONIN, ED: Troponin i, poc: 0.01 ng/mL (ref 0.00–0.08)

## 2018-07-11 LAB — HEMOGLOBIN A1C
Hgb A1c MFr Bld: 7.6 % — ABNORMAL HIGH (ref 4.8–5.6)
Mean Plasma Glucose: 171.42 mg/dL

## 2018-07-11 LAB — CBC
HCT: 42.8 % (ref 36.0–46.0)
Hemoglobin: 13.7 g/dL (ref 12.0–15.0)
MCH: 29.3 pg (ref 26.0–34.0)
MCHC: 32 g/dL (ref 30.0–36.0)
MCV: 91.5 fL (ref 78.0–100.0)
Platelets: 244 10*3/uL (ref 150–400)
RBC: 4.68 MIL/uL (ref 3.87–5.11)
RDW: 13.5 % (ref 11.5–15.5)
WBC: 5.4 10*3/uL (ref 4.0–10.5)

## 2018-07-11 LAB — GLUCOSE, CAPILLARY
Glucose-Capillary: 87 mg/dL (ref 70–99)
Glucose-Capillary: 148 mg/dL — ABNORMAL HIGH (ref 70–99)

## 2018-07-11 LAB — TROPONIN I
Troponin I: <0.03 ng/mL (ref <0.03)
Troponin I: <0.03 ng/mL (ref <0.03)

## 2018-07-11 MED ORDER — DILTIAZEM HCL ER COATED BEADS 240 MG PO CP24
240.0000 mg | ORAL_CAPSULE | Freq: Every day | ORAL | Status: DC
  Administered 2018-07-12 – 2018-07-13 (×2): 240 mg via ORAL
  Filled 2018-07-11 (×2): qty 1

## 2018-07-11 MED ORDER — ACETAMINOPHEN 500 MG PO TABS: 500.0000 mg | ORAL_TABLET | Freq: Four times a day (QID) | ORAL | Status: DC | PRN

## 2018-07-11 MED ORDER — GI COCKTAIL ~~LOC~~: 30.0000 mL | Freq: Four times a day (QID) | ORAL | Status: DC | PRN

## 2018-07-11 MED ORDER — PANTOPRAZOLE SODIUM 40 MG PO TBEC
40.0000 mg | DELAYED_RELEASE_TABLET | Freq: Every day | ORAL | Status: DC
  Administered 2018-07-12 – 2018-07-13 (×2): 40 mg via ORAL
  Filled 2018-07-11 (×2): qty 1

## 2018-07-11 MED ORDER — HEPARIN BOLUS VIA INFUSION
4000.0000 [IU] | Freq: Once | INTRAVENOUS | Status: AC
  Administered 2018-07-11: 4000 [IU] via INTRAVENOUS
  Filled 2018-07-11: qty 4000

## 2018-07-11 MED ORDER — ACETAMINOPHEN 500 MG PO TABS: 500.0000 mg | ORAL_TABLET | ORAL | Status: DC | PRN

## 2018-07-11 MED ORDER — HYDROCHLOROTHIAZIDE 25 MG PO TABS: 25.0000 mg | ORAL_TABLET | Freq: Every day | ORAL | Status: DC

## 2018-07-11 MED ORDER — ONDANSETRON HCL 4 MG/2ML IJ SOLN
4.0000 mg | Freq: Four times a day (QID) | INTRAMUSCULAR | Status: DC | PRN
  Administered 2018-07-12: 4 mg via INTRAVENOUS
  Filled 2018-07-11: qty 2

## 2018-07-11 MED ORDER — IRBESARTAN 300 MG PO TABS
300.0000 mg | ORAL_TABLET | Freq: Every day | ORAL | Status: DC
  Administered 2018-07-12 – 2018-07-13 (×2): 300 mg via ORAL
  Filled 2018-07-11 (×2): qty 1

## 2018-07-11 MED ORDER — INSULIN ASPART 100 UNIT/ML ~~LOC~~ SOLN: 0.0000 [IU] | Freq: Every day | SUBCUTANEOUS | Status: DC

## 2018-07-11 MED ORDER — HEPARIN (PORCINE) IN NACL 100-0.45 UNIT/ML-% IJ SOLN
900.0000 [IU]/h | INTRAMUSCULAR | Status: DC
  Administered 2018-07-11: 900 [IU]/h via INTRAVENOUS
  Filled 2018-07-11 (×2): qty 250

## 2018-07-11 MED ORDER — POTASSIUM CHLORIDE CRYS ER 20 MEQ PO TBCR
20.0000 meq | EXTENDED_RELEASE_TABLET | Freq: Every day | ORAL | Status: DC
  Administered 2018-07-11 – 2018-07-13 (×3): 20 meq via ORAL
  Filled 2018-07-11 (×3): qty 1

## 2018-07-11 MED ORDER — NITROGLYCERIN 0.4 MG SL SUBL: 0.4000 mg | SUBLINGUAL_TABLET | SUBLINGUAL | Status: DC | PRN

## 2018-07-11 MED ORDER — ALPRAZOLAM 0.25 MG PO TABS: 0.2500 mg | ORAL_TABLET | Freq: Two times a day (BID) | ORAL | Status: DC | PRN

## 2018-07-11 MED ORDER — MORPHINE SULFATE (PF) 4 MG/ML IV SOLN: 2.0000 mg | INTRAVENOUS | Status: DC | PRN

## 2018-07-11 MED ORDER — ASPIRIN EC 325 MG PO TBEC: 325.0000 mg | DELAYED_RELEASE_TABLET | Freq: Every day | ORAL | Status: DC

## 2018-07-11 MED ORDER — ASPIRIN 81 MG PO CHEW
324.0000 mg | CHEWABLE_TABLET | Freq: Once | ORAL | Status: AC
  Administered 2018-07-11: 324 mg via ORAL
  Filled 2018-07-11: qty 4

## 2018-07-11 MED ORDER — ROSUVASTATIN CALCIUM 10 MG PO TABS
10.0000 mg | ORAL_TABLET | Freq: Every day | ORAL | Status: DC
  Administered 2018-07-12: 10 mg via ORAL
  Filled 2018-07-11: qty 1

## 2018-07-11 MED ORDER — INSULIN ASPART 100 UNIT/ML ~~LOC~~ SOLN
0.0000 [IU] | Freq: Three times a day (TID) | SUBCUTANEOUS | Status: DC
  Administered 2018-07-12 (×2): 2 [IU] via SUBCUTANEOUS

## 2018-07-11 MED ORDER — NITROGLYCERIN 0.4 MG SL SUBL
0.4000 mg | SUBLINGUAL_TABLET | SUBLINGUAL | Status: DC | PRN
Start: 2018-07-11 — End: 2018-07-13

## 2018-07-11 MED ORDER — VALSARTAN-HYDROCHLOROTHIAZIDE 320-25 MG PO TABS: 1.0000 | ORAL_TABLET | Freq: Every day | ORAL | Status: DC

## 2018-07-11 NOTE — Progress Notes (Signed)
Pt complaining of chest discomfort  EKG completed and placed in chart  Pt family at bedside states her eyes do not look the same  [place] assessment completed  Night shift RN at bedside  Paged MD

## 2018-07-11 NOTE — ED Triage Notes (Signed)
Patient arrived from home reports chest pain from 11 pm yesterday. She reports Head ache and blurry vision this morning. She reports that the blurry vision has resolved "for now." EMS gave 1 Nitro, patient refused a second Nitro per EMS. Uses cane at baseline.

## 2018-07-11 NOTE — Progress Notes (Signed)
ANTICOAGULATION CONSULT NOTE - Initial Consult  Pharmacy Consult for heparin Indication: chest pain/ACS  Allergies  Allergen Reactions  . Sulfa Antibiotics Hives, Itching and Nausea And Vomiting    Patient Measurements: Height: 5\' 4"  (162.6 cm) Weight: 165 lb (74.8 kg) IBW/kg (Calculated) : 54.7 Heparin Dosing Weight: 70.3kg  Vital Signs: Temp: 98.6 F (37 C) (08/02 1020) Temp Source: Oral (08/02 1020) BP: 133/88 (08/02 1415) Pulse Rate: 70 (08/02 1415)  Labs: Recent Labs    07/11/18 1117  HGB 13.7  HCT 42.8  PLT 244  CREATININE 1.35*    Estimated Creatinine Clearance: 41.1 mL/min (A) (by C-G formula based on SCr of 1.35 mg/dL (H)).   Medical History: Past Medical History:  Diagnosis Date  . Angina   . Bronchitis   . Diabetes mellitus   . Fibromyalgia   . GERD (gastroesophageal reflux disease)   . Headache(784.0)   . Hyperlipidemia   . Hypertension   . Hypertensive heart disease without CHF   . Lupus ([place])    "treated for it from 1992 til 2012; dr said I don't have it anymore"  . Obesity (BMI 30-39.9)   . Osteoarthritis   . Pneumonia   . Shortness of breath    lying down  . Shortness of breath on exertion     Medications:  Infusions:  . heparin      Assessment: 63 yof presented to the ED with CP. To start IV heparin for r/o ACS. Baseline CBC is WNL and she is not on anticoagulation PTA.   Goal of Therapy:  Heparin level 0.3-0.7 units/ml Monitor platelets by anticoagulation protocol: Yes   Plan:  Heparin bolus 4000 units IV x 1 Heparin gtt 900 units/hr Check a 6 hr heparin level Daily heparin level and CBC  Briona Korpela, Rande Lawman 07/11/2018,2:45 PM

## 2018-07-11 NOTE — H&P (Signed)
History and Physical    Bridget Good NTZ:[id number] DOB: [birth year] DOA: 07/11/2018  PCP: [name] Patient coming from: home  Chief Complaint: chest pain  HPI: Bridget Good is a 67 y.o. female with medical history significant for Hypertension, hyperlipidemia, diabetes, GERD, bronchitis, angina presents to the emergency Department chief complaint of chest pain. Initial evaluation concerning for ACS. [facility] asked to admit.  Information is obtained from the patient and her family who are at the bedside. Patient states his morning when she got up she developed chest pain. She describes it as a cramp and is located substernal radiates to his shoulder. She reports the intensity between 2 and 4 out of 10. She denies shortness of breath but does endorse nausea without vomiting. She denies headache visual disturbances syncope or near-syncope. She states over and over that she "just didn't feel well". Denies abdominal pain dysuria hematuria frequency or urgency. She denies diarrhea constipation melena bright red blood per rectum. She denies lower extremity edema orthopnea cough fever chills recent travel or sick contacts.she reports she was given some aspirin and ntg and the pain subsided.  ED Course: in the emergency department she's afebrile hemodynamically stable and not hypoxic. EKG reveals T-wave inversion in lateral leads. Heparin gtt was initiated in the emergency department.  Review of Systems: As per HPI otherwise all other systems reviewed and are negative.   Ambulatory Status: lives at home with her husband and ambulates independently is independent with ADLs  Past Medical History:  Diagnosis Date  . Angina   . Bronchitis   . Diabetes mellitus   . Fibromyalgia   . GERD (gastroesophageal reflux disease)   . Headache(784.0)   . Hyperlipidemia   . Hypertension   . Hypertensive heart disease without CHF   . Lupus ([place])    "treated for it from 1992 til 2012; dr  said I don't have it anymore"  . Obesity (BMI 30-39.9)   . Osteoarthritis   . Pneumonia   . Shortness of breath    lying down  . Shortness of breath on exertion     Past Surgical History:  Procedure Laterality Date  . ABDOMINAL HYSTERECTOMY     partial  . CARDIAC CATHETERIZATION  ~ 2007  . CESAREAN SECTION  1978; 1981  . COLONOSCOPY    . FOOT SURGERY     "had to cut it to let the fluids out; it had swollen very badly; left foot"    Social History   Socioeconomic History  . Marital status: Married    Spouse name: Not on file  . Number of children: Not on file  . Years of education: Not on file  . Highest education level: Not on file  Occupational History  . Not on file  Social Needs  . Financial resource strain: Not on file  . Food insecurity:    Worry: Not on file    Inability: Not on file  . Transportation needs:    Medical: Not on file    Non-medical: Not on file  Tobacco Use  . Smoking status: Never Smoker  . Smokeless tobacco: Never Used  Substance and Sexual Activity  . Alcohol use: No    Alcohol/week: 0.0 oz  . Drug use: No  . Sexual activity: Yes    Partners: Male    Birth control/protection: Surgical  Lifestyle  . Physical activity:    Days per week: Not on file    Minutes per session: Not on file  .  Stress: Not on file  Relationships  . Social connections:    Talks on phone: Not on file    Gets together: Not on file    Attends religious service: Not on file    Active member of club or organization: Not on file    Attends meetings of clubs or organizations: Not on file    Relationship status: Not on file  . Intimate partner violence:    Fear of current or ex partner: Not on file    Emotionally abused: Not on file    Physically abused: Not on file    Forced sexual activity: Not on file  Other Topics Concern  . Not on file  Social History Narrative   Married.  Husband is psychotherapist.  Several children.  Husband is Theme park manager of [employer] in [place].    Allergies  Allergen Reactions  . Sulfa Antibiotics Hives, Itching and Nausea And Vomiting    Family History  Problem Relation Age of Onset  . Heart attack Father   . Diabetes Father   . Hypertension Mother   . Heart attack Brother   . Kidney failure Brother   . Diabetes Sister   . Diabetes Sister     Prior to Admission medications   Medication Sig Start Date End Date Taking? Authorizing Provider  acetaminophen (TYLENOL) 500 MG tablet Take 500 mg by mouth as needed for mild pain.   Yes [provider]  aspirin EC 81 MG tablet Take 81 mg by mouth daily as needed for mild pain.    Yes [provider]  diltiazem (CARDIZEM CD) 240 MG 24 hr capsule Take 1 capsule (240 mg total) by mouth daily. 05/10/18  Yes Isla Pence, MD  KLOR-CON M20 20 MEQ tablet Take 20 mEq by mouth daily.  04/06/15  Yes [provider]  nitroGLYCERIN (NITROSTAT) 0.4 MG SL tablet Place 1 tablet (0.4 mg total) under the tongue every 5 (five) minutes as needed for chest pain. 01/02/12 07/11/18 Yes Jacolyn Reedy, MD  pantoprazole (PROTONIX) 40 MG tablet Take 1 tablet (40 mg total) by mouth daily at 12 noon. 05/10/18 10/01/21 Yes Isla Pence, MD  rosuvastatin (CRESTOR) 10 MG tablet Take 10 mg by mouth daily.   Yes [provider]  valsartan-hydrochlorothiazide (DIOVAN-HCT) 320-25 MG tablet Take 1 tablet by mouth daily. 05/10/18  Yes Isla Pence, MD    Physical Exam: Vitals:   07/11/18 1500 07/11/18 1530 07/11/18 1545 07/11/18 1600  BP: 135/86 (!) 155/87 (!) 149/91 (!) 145/86  Pulse: (!) 56 62 62 (!) 57  Resp: 12 16 18 17   Temp:      TempSrc:      SpO2: 99% 100% 98% 98%  Weight:      Height:         General:  Appears slightly anxious somewhat fidgety but in no acute distress Eyes:  PERRL, EOMI, normal lids, iris ENT:  grossly normal hearing, lips & tongue, pink somewhat dry Neck:  no LAD, masses or thyromegaly Cardiovascular:  RRR, no m/r/g. No  LE edema. Pedal pulses present and palpable Respiratory:  CTA bilaterally, no w/r/r. Normal respiratory effort. Abdomen:  soft, ntnd, positive bowel sounds throughout no guarding or rebounding Skin:  no rash or induration seen on limited exam Musculoskeletal:  grossly normal tone BUE/BLE, good ROM, no bony abnormality Psychiatric:  grossly normal mood and affect, speech fluent and appropriate, AOx3 Neurologic:  CN 2-12 grossly intact, moves all extremities in coordinated fashion, sensation  intact  Labs on Admission: I have personally reviewed following labs and imaging studies  CBC: Recent Labs  Lab 07/11/18 1117  WBC 5.4  HGB 13.7  HCT 42.8  MCV 91.5  PLT 734   Basic Metabolic Panel: Recent Labs  Lab 07/11/18 1117  NA 142  K 3.5  CL 106  CO2 27  GLUCOSE 145*  BUN 15  CREATININE 1.35*  CALCIUM 9.0   GFR: Estimated Creatinine Clearance: 41.1 mL/min (A) (by C-G formula based on SCr of 1.35 mg/dL (H)). Liver Function Tests: Recent Labs  Lab 07/11/18 1117  AST 20  ALT 16  ALKPHOS 74  BILITOT 1.1  PROT 6.2*  ALBUMIN 3.4*   No results for input(s): LIPASE, AMYLASE in the last 168 hours. No results for input(s): AMMONIA in the last 168 hours. Coagulation Profile: No results for input(s): INR, PROTIME in the last 168 hours. Cardiac Enzymes: Recent Labs  Lab 07/11/18 1521  TROPONINI <0.03   BNP (last 3 results) No results for input(s): PROBNP in the last 8760 hours. HbA1C: Recent Labs    07/11/18 1521  HGBA1C 7.6*   CBG: No results for input(s): GLUCAP in the last 168 hours. Lipid Profile: No results for input(s): CHOL, HDL, LDLCALC, TRIG, CHOLHDL, LDLDIRECT in the last 72 hours. Thyroid Function Tests: No results for input(s): TSH, T4TOTAL, FREET4, T3FREE, THYROIDAB in the last 72 hours. Anemia Panel: No results for input(s): VITAMINB12, FOLATE, FERRITIN, TIBC, IRON, RETICCTPCT in the last 72 hours. Urine analysis:    Component Value Date/Time    COLORURINE STRAW (A) 05/27/2018 0826   APPEARANCEUR CLEAR 05/27/2018 0826   LABSPEC 1.008 05/27/2018 0826   PHURINE 6.0 05/27/2018 0826   GLUCOSEU NEGATIVE 05/27/2018 0826   HGBUR NEGATIVE 05/27/2018 0826   BILIRUBINUR NEGATIVE 05/27/2018 0826   KETONESUR NEGATIVE 05/27/2018 0826   PROTEINUR NEGATIVE 05/27/2018 0826   UROBILINOGEN 1.0 05/26/2015 1103   NITRITE NEGATIVE 05/27/2018 0826   LEUKOCYTESUR NEGATIVE 05/27/2018 0826    Creatinine Clearance: Estimated Creatinine Clearance: 41.1 mL/min (A) (by C-G formula based on SCr of 1.35 mg/dL (H)).  Sepsis Labs: @LABRCNTIP (procalcitonin:4,lacticidven:4) )No results found for this or any previous visit (from the past 240 hour(s)).   Radiological Exams on Admission: Dg Chest Port 1 View  Result Date: 07/11/2018 CLINICAL DATA:  Chest pain EXAM: PORTABLE CHEST 1 VIEW COMPARISON:  05/27/2018 FINDINGS: The heart size and mediastinal contours are within normal limits. Both lungs are clear. The visualized skeletal structures are unremarkable. IMPRESSION: No active disease. Electronically Signed   By: Franchot Gallo M.D.   On: 07/11/2018 11:44    EKG: Independently reviewed. Normal sinus rhythm T wave inversion Inferolateral leads  Assessment/Plan Principal Problem:   Chest pain Active Problems:   Diabetes mellitus type 2, noninsulin dependent (HCC)   CAD (coronary artery disease)   Abnormal EKG   Hypertensive heart disease without CHF   Hyperlipidemia   Obesity (BMI 30-39.9)   AKI (acute kidney injury) ([place])   GERD (gastroesophageal reflux disease)   #1. Chest pain/normal EKG. Some typical and atypical features. There is concern for ACS. Heart score 7. EKG with T-wave inversion in lateral leads. Improved with nitroglycerin. Distal troponin negative chest x-ray without acute abnormality. Per ED provider case discussed with cardiology. Heparin drip initiated. She is pain-free at the point of admission. Of note 5 years ago she was admitted  for same. That time serial cardiac enzymes were negative. She was started on metoprolol and underwent a cardiac CT the next  day. Chart review indicates she had less than 50% plaque involving her left anterior descending and had plaque involving the right coronary artery. The report also indicates there was minimal plaque involving the circumflex coronary artery. She underwent X he can Cardiolite study the day of discharge which showed an ejection fraction of 69% and no ischemia. Chart review indicates at that point she was diagnosed with coronary artery disease but had atypical chest pain. She was placed on metoprolol 25 mg counseled to lose weight and improve her diabetes control -admit to telemetry -Cycle troponin -Serial EKG -Continue heparin drip -lipid panel -defer further management to cardiology  #2. Acute kidney injury. Creatinine 1.35. Likely related to decreased oral intake in the setting of HCTZ -Gentle IV fluids -hold nephrotoxins -Monitor urine output  #3.diabetes. Glucose 145 -Obtain a hemoglobin A1c -Sliding scale insulin for optimal control -Out patient follow-up  #4.hyperlipidemia. -Obtain a lipid panel -Continue home meds   DVT prophylaxis: heparin gtt  Code Status: full  Family Communication: husband and daughter at bedside  Disposition Plan: home  Consults called: cardmaster Admission status: inpatient    Radene Gunning NP [facility]  If 7PM-7AM, please contact night-coverage [email or web address] Password [facility]  07/11/2018, 4:45 PM

## 2018-07-11 NOTE — Progress Notes (Signed)
MD returned page Ordered head CT  Pt and family at bedside aware

## 2018-07-11 NOTE — Consult Note (Signed)
Cardiology Consultation:   Patient ID: Bridget Good; [id number]; Sep 08, 1951   Admit date: 07/11/2018 Date of Consult: 07/11/2018  Primary Care Provider: [name] Primary Cardiologist: new to [facility]  Primary Electrophysiologist:  None    Patient Profile:   Bridget Good is a 67 y.o. female with a hx of hypertension, hyperlipidemia, diabetes mellitus who is being seen today for the evaluation of chest discomfort at the request of Dr Daryll Drown .  History of Present Illness:   Bridget Good is a 67 year old female with a history of hypertension, hyperlipidemia and diabetes.  She woke up this morning with a chest pain/chest cramp.  It was substernal and radiated back to her shoulders.  The pain lasted for several hours.  It is gradually eased up.  Is described as a midsternal ache or cramp-like chest pain.  There is no radiation.  Is not worsened with a deep breath.  She questions whether or not it was worsened with exercise but she has not done any exercise today.  It is not associated with eating or drinking.  It is not worsened or improved by laying back or sitting forward.  Not associated with any diaphoresis, syncope, presyncope or shortness of breath.  She denies any other illness.  Her medical doctor retired showed so she has not seen her medical doctor in quite a while.  She thinks her blood pressure might of been a little elevated recently.     She is been admitted to the hospital with chest pain in the past  -  January, 2013.  A stress Myoview study was negative for ischemia.  She had normal left ventricular systolic function with an ejection fraction of 69%.  Past Medical History:  Diagnosis Date  . Angina   . Bronchitis   . Diabetes mellitus   . Fibromyalgia   . GERD (gastroesophageal reflux disease)   . Headache(784.0)   . Hyperlipidemia   . Hypertension   . Hypertensive heart disease without CHF   . Lupus ([place])    "treated for it from 1992 til 2012; dr said I  don't have it anymore"  . Obesity (BMI 30-39.9)   . Osteoarthritis   . Pneumonia   . Shortness of breath    lying down  . Shortness of breath on exertion     Past Surgical History:  Procedure Laterality Date  . ABDOMINAL HYSTERECTOMY     partial  . CARDIAC CATHETERIZATION  ~ 2007  . CESAREAN SECTION  1978; 1981  . COLONOSCOPY    . FOOT SURGERY     "had to cut it to let the fluids out; it had swollen very badly; left foot"     Home Medications:  Prior to Admission medications   Medication Sig Start Date End Date Taking? Authorizing Provider  acetaminophen (TYLENOL) 500 MG tablet Take 500 mg by mouth as needed for mild pain.   Yes [provider]  aspirin EC 81 MG tablet Take 81 mg by mouth daily as needed for mild pain.    Yes [provider]  diltiazem (CARDIZEM CD) 240 MG 24 hr capsule Take 1 capsule (240 mg total) by mouth daily. 05/10/18  Yes Isla Pence, MD  KLOR-CON M20 20 MEQ tablet Take 20 mEq by mouth daily.  04/06/15  Yes [provider]  nitroGLYCERIN (NITROSTAT) 0.4 MG SL tablet Place 1 tablet (0.4 mg total) under the tongue every 5 (five) minutes as needed for chest pain. 01/02/12 07/11/18 Yes Jacolyn Reedy,  MD  pantoprazole (PROTONIX) 40 MG tablet Take 1 tablet (40 mg total) by mouth daily at 12 noon. 05/10/18 10/01/21 Yes Isla Pence, MD  rosuvastatin (CRESTOR) 10 MG tablet Take 10 mg by mouth daily.   Yes [provider]  valsartan-hydrochlorothiazide (DIOVAN-HCT) 320-25 MG tablet Take 1 tablet by mouth daily. 05/10/18  Yes Isla Pence, MD    Inpatient Medications: Scheduled Meds: . [START ON 07/12/2018] diltiazem  240 mg Oral Daily  . insulin aspart  0-5 Units Subcutaneous QHS  . insulin aspart  0-9 Units Subcutaneous TID WC  . [START ON 07/12/2018] irbesartan  300 mg Oral Daily  . [START ON 07/12/2018] pantoprazole  40 mg Oral Q1200  . potassium chloride SA  20 mEq Oral Daily  . [START ON 07/12/2018] rosuvastatin  10 mg Oral  Daily   Continuous Infusions: . heparin 900 Units/hr (07/11/18 1542)   PRN Meds: acetaminophen, ALPRAZolam, gi cocktail, morphine injection, nitroGLYCERIN, nitroGLYCERIN, ondansetron (ZOFRAN) IV  Allergies:    Allergies  Allergen Reactions  . Sulfa Antibiotics Hives, Itching and Nausea And Vomiting    Social History:   Social History   Socioeconomic History  . Marital status: Married    Spouse name: Not on file  . Number of children: Not on file  . Years of education: Not on file  . Highest education level: Not on file  Occupational History  . Not on file  Social Needs  . Financial resource strain: Not on file  . Food insecurity:    Worry: Not on file    Inability: Not on file  . Transportation needs:    Medical: Not on file    Non-medical: Not on file  Tobacco Use  . Smoking status: Never Smoker  . Smokeless tobacco: Never Used  Substance and Sexual Activity  . Alcohol use: No    Alcohol/week: 0.0 oz  . Drug use: No  . Sexual activity: Yes    Partners: Male    Birth control/protection: Surgical  Lifestyle  . Physical activity:    Days per week: Not on file    Minutes per session: Not on file  . Stress: Not on file  Relationships  . Social connections:    Talks on phone: Not on file    Gets together: Not on file    Attends religious service: Not on file    Active member of club or organization: Not on file    Attends meetings of clubs or organizations: Not on file    Relationship status: Not on file  . Intimate partner violence:    Fear of current or ex partner: Not on file    Emotionally abused: Not on file    Physically abused: Not on file    Forced sexual activity: Not on file  Other Topics Concern  . Not on file  Social History Narrative   Married.  Husband is psychotherapist.  Several children.  Husband is Theme park manager of [employer] in [place].    Family History:    Family History  Problem Relation Age of Onset  . Heart attack Father   .  Diabetes Father   . Hypertension Mother   . Heart attack Brother   . Kidney failure Brother   . Diabetes Sister   . Diabetes Sister      ROS:  Please see the history of present illness.   All other ROS reviewed and negative.     Physical Exam/Data:   Vitals:   07/11/18 1545  07/11/18 1600 07/11/18 1654 07/11/18 1655  BP: (!) 149/91 (!) 145/86  (!) 175/84  Pulse: 62 (!) 57  62  Resp: 18 17    Temp:    98.2 F (36.8 C)  TempSrc:    Oral  SpO2: 98% 98%  98%  Weight:   164 lb 3.2 oz (74.5 kg)   Height:   5\' 4"  (1.626 m)     Intake/Output Summary (Last 24 hours) at 07/11/2018 1659 Last data filed at 07/11/2018 1651 Gross per 24 hour  Intake -  Output 500 ml  Net -500 ml   Filed Weights   07/11/18 1017 07/11/18 1654  Weight: 165 lb (74.8 kg) 164 lb 3.2 oz (74.5 kg)   Body mass index is 28.18 kg/m.  General:  Well nourished, well developed, in no acute distress HEENT: normal Lymph: no adenopathy Neck: no JVD Endocrine:  No thryomegaly Vascular: No carotid bruits; FA pulses 2+ bilaterally without bruits  Cardiac:  normal S1, S2; RRR;  Soft systolic murmur  Lungs:  clear to auscultation bilaterally, no wheezing, rhonchi or rales  Abd: soft, nontender, no hepatomegaly  Ext: no edema Musculoskeletal:  No deformities, BUE and BLE strength normal and equal Skin: warm and dry  Neuro:  CNs 2-12 intact, no focal abnormalities noted Psych:  Normal affect   EKG:  The EKG was personally reviewed and demonstrates:  NSR , TWI in the inferior and ant. Lat leads.  The T wave inversions in the inferior leads and anterolateral leads are new from her previous EKG on May 29, 2018 although they were present on EKG on May 10, 2018  Telemetry:  Telemetry was personally reviewed and demonstrates:   NSR   Relevant CV Studies: Previous Myoview study in 2013 was negative for ischemia.  Laboratory Data:  Chemistry Recent Labs  Lab 07/11/18 1117  NA 142  K 3.5  CL 106  CO2 27    GLUCOSE 145*  BUN 15  CREATININE 1.35*  CALCIUM 9.0  GFRNONAA 40*  GFRAA 47*  ANIONGAP 9    Recent Labs  Lab 07/11/18 1117  PROT 6.2*  ALBUMIN 3.4*  AST 20  ALT 16  ALKPHOS 74  BILITOT 1.1   Hematology Recent Labs  Lab 07/11/18 1117  WBC 5.4  RBC 4.68  HGB 13.7  HCT 42.8  MCV 91.5  MCH 29.3  MCHC 32.0  RDW 13.5  PLT 244   Cardiac Enzymes Recent Labs  Lab 07/11/18 1521  TROPONINI <0.03    Recent Labs  Lab 07/11/18 1131  TROPIPOC 0.01    BNPNo results for input(s): BNP, PROBNP in the last 168 hours.  DDimer No results for input(s): DDIMER in the last 168 hours.  Radiology/Studies:  Dg Chest Port 1 View  Result Date: 07/11/2018 CLINICAL DATA:  Chest pain EXAM: PORTABLE CHEST 1 VIEW COMPARISON:  05/27/2018 FINDINGS: The heart size and mediastinal contours are within normal limits. Both lungs are clear. The visualized skeletal structures are unremarkable. IMPRESSION: No active disease. Electronically Signed   By: Franchot Gallo M.D.   On: 07/11/2018 11:44    Assessment and Plan:   1. 1.  Chest discomfort: Patient presents with chest pain that lasted for several hours this morning.  It was described as a chest ache.  There was no radiation.  There was no associated shortness of breath, diaphoresis, syncope or presyncope.  Initial troponin levels are negative.  Her EKG reveals T wave inversions in the inferior and lateral leads.  These T wave inversions were not present during her last EKG on June 20th but they were seen on May 10, 2018.  It is not clear if these are related to hypertension or perhaps ischemia.  We will schedule her for a Lexiscan Myoview study tomorrow.  We will continue heparin for tonight.  If her troponin levels become positive, we should cancel the Myoview study and   plan on doing a heart catheterization on Monday.  2.  Systolic murmur: She has a very soft systolic murmur.  She is never known to have a heart murmur in the past.  We will  get an echocardiogram for further evaluation and also to further evaluate her LV function.  2.  Hypertension: Continue current medications at present.  Titrate as needed   For questions or updates, please contact [facility] Please consult [email or web address] for contact info under Cardiology/STEMI.   Signed, Mertie Moores, MD  07/11/2018 4:59 PM

## 2018-07-11 NOTE — Progress Notes (Signed)
ANTICOAGULATION CONSULT NOTE - Early for heparin Indication: chest pain/ACS  Allergies  Allergen Reactions  . Sulfa Antibiotics Hives, Itching and Nausea And Vomiting    Patient Measurements: Height: 5\' 4"  (162.6 cm) Weight: 164 lb 3.2 oz (74.5 kg) IBW/kg (Calculated) : 54.7 Heparin Dosing Weight: 70.3kg  Vital Signs: Temp: 98.1 F (36.7 C) (08/02 2126) Temp Source: Oral (08/02 2126) BP: 131/79 (08/02 2126) Pulse Rate: 55 (08/02 2126)  Labs: Recent Labs    07/11/18 1117 07/11/18 1521 07/11/18 2108  HGB 13.7  --   --   HCT Bridget.8  --   --   PLT 244  --   --   HEPARINUNFRC  --   --  0.60  CREATININE 1.35*  --   --   TROPONINI  --  <0.03  --     Estimated Creatinine Clearance: 41.1 mL/min (A) (by C-G formula based on SCr of 1.35 mg/dL (H)).   Medical History: Past Medical History:  Diagnosis Date  . Angina   . Bronchitis   . Diabetes mellitus   . Fibromyalgia   . GERD (gastroesophageal reflux disease)   . Headache(784.0)   . Hyperlipidemia   . Hypertension   . Hypertensive heart disease without CHF   . Lupus ([place])    "treated for it from 1992 til 2012; dr said I don't have it anymore"  . Obesity (BMI 30-39.9)   . Osteoarthritis   . Pneumonia   . Shortness of breath    lying down  . Shortness of breath on exertion     Medications:  Infusions:  . heparin 900 Units/hr (07/11/18 1700)    Assessment: Bridget Good presented to the ED with CP. To start IV heparin for r/o ACS. Baseline CBC is WNL and she is not on anticoagulation PTA.   Initial heparin level therapeutic at 0.60.  Goal of Therapy:  Heparin level 0.3-0.7 units/ml Monitor platelets by anticoagulation protocol: Yes   Plan:  -Continue heparin 900 units/hr -Recheck with am labs  Arrie Senate, PharmD, BCPS Clinical Pharmacist [phone] Please check AMION for all [facility] numbers 07/11/2018

## 2018-07-11 NOTE — ED Notes (Signed)
Patient reports that she took Crestor and Diltiazem this morning

## 2018-07-11 NOTE — ED Provider Notes (Signed)
[facility] EMERGENCY DEPARTMENT Provider Note   CSN: [id number] Arrival date & time: 07/11/18  9030     History   Chief Complaint Chief Complaint  Patient presents with  . Chest Pain    HPI Bridget Good is a 67 y.o. female.  HPI  67 year old female history of diabetes, fibromyalgia, Hypertension, obesity who presents today with crampy substernal chest pain that began around 7 AM.  She states that it has waxed and waned between a 4 to a 2 out of 10.  She received aspirin and nitro and route and pain decreased with nitroglycerin.  Had some blurry vision which she has felt like with double vision.  She has had no lateralized deficits.  She denies any dyspnea or vomiting.  She has had some associated nausea.  States she has had some chest pain in the past but does not think that she has had a cardiac evaluation previously. Past Medical History:  Diagnosis Date  . Angina   . Bronchitis   . Diabetes mellitus   . Fibromyalgia   . GERD (gastroesophageal reflux disease)   . Headache(784.0)   . Hyperlipidemia   . Hypertension   . Hypertensive heart disease without CHF   . Lupus ([place])    "treated for it from 1992 til 2012; dr said I don't have it anymore"  . Obesity (BMI 30-39.9)   . Osteoarthritis   . Pneumonia   . Shortness of breath    lying down  . Shortness of breath on exertion     Patient Active Problem List   Diagnosis Date Noted  . Retrognathia 07/10/2018  . Psychophysiological insomnia 07/10/2018  . Snoring 07/10/2018  . History of lupus ([place]) 01/02/2012  . CAD (coronary artery disease)   . Diabetes mellitus type 2, noninsulin dependent ([place])   . Hypertensive heart disease without CHF   . Hyperlipidemia   . Obesity (BMI 30-39.9)   . GERD (gastroesophageal reflux disease)   . Osteoarthritis     Past Surgical History:  Procedure Laterality Date  . ABDOMINAL HYSTERECTOMY     partial  . CARDIAC CATHETERIZATION  ~ 2007  . CESAREAN  SECTION  1978; 1981  . COLONOSCOPY    . FOOT SURGERY     "had to cut it to let the fluids out; it had swollen very badly; left foot"     OB History   None      Home Medications    Prior to Admission medications   Medication Sig Start Date End Date Taking? Authorizing Provider  aspirin EC 81 MG tablet Take 81 mg by mouth daily as needed for mild pain.     [provider]  diltiazem (CARDIZEM CD) 240 MG 24 hr capsule Take 1 capsule (240 mg total) by mouth daily. 05/10/18   Isla Pence, MD  KLOR-CON M20 20 MEQ tablet Take 20 mEq by mouth daily.  04/06/15   [provider]  nitroGLYCERIN (NITROSTAT) 0.4 MG SL tablet Place 1 tablet (0.4 mg total) under the tongue every 5 (five) minutes as needed for chest pain. 01/02/12 05/27/18  Jacolyn Reedy, MD  pantoprazole (PROTONIX) 40 MG tablet Take 1 tablet (40 mg total) by mouth daily at 12 noon. 05/10/18 10/01/21  Isla Pence, MD  rosuvastatin (CRESTOR) 10 MG tablet Take 10 mg by mouth daily.    [provider]  valsartan-hydrochlorothiazide (DIOVAN-HCT) 320-25 MG tablet Take 1 tablet by mouth daily. 05/10/18   Isla Pence, MD  Family History Family History  Problem Relation Age of Onset  . Heart attack Father   . Diabetes Father   . Hypertension Mother   . Heart attack Brother   . Kidney failure Brother   . Diabetes Sister   . Diabetes Sister     Social History Social History   Tobacco Use  . Smoking status: Never Smoker  . Smokeless tobacco: Never Used  Substance Use Topics  . Alcohol use: No    Alcohol/week: 0.0 oz  . Drug use: No     Allergies   Sulfa antibiotics   Review of Systems Review of Systems  All other systems reviewed and are negative.    Physical Exam Updated Vital Signs BP 134/87   Pulse 60   Temp 98.6 F (37 C) (Oral)   Resp 16   Ht 1.626 m (5\' 4" )   Wt 74.8 kg (165 lb)   SpO2 100%   BMI 28.32 kg/m   Physical Exam  Constitutional: She is oriented to  person, place, and time. She appears well-developed and well-nourished.  HENT:  Head: Normocephalic and atraumatic.  Right Ear: External ear normal.  Left Ear: External ear normal.  Nose: Nose normal.  Mouth/Throat: Oropharynx is clear and moist.  Eyes: Pupils are equal, round, and reactive to light. Conjunctivae and EOM are normal.  Neck: Normal range of motion. Neck supple. No JVD present. No tracheal deviation present. No thyromegaly present.  Cardiovascular: Normal rate, regular rhythm, normal heart sounds and intact distal pulses.  Pulmonary/Chest: Effort normal and breath sounds normal. She has no wheezes.  Abdominal: Soft. Bowel sounds are normal. She exhibits no mass. There is no tenderness. There is no guarding.  Musculoskeletal: Normal range of motion.  Lymphadenopathy:    She has no cervical adenopathy.  Neurological: She is alert and oriented to person, place, and time. She has normal reflexes. No cranial nerve deficit or sensory deficit. Gait normal. GCS eye subscore is 4. GCS verbal subscore is 5. GCS motor subscore is 6. She displays no Babinski's sign on the right side. She displays no Babinski's sign on the left side.  Reflex Scores:      Bicep reflexes are 2+ on the right side and 2+ on the left side.      Patellar reflexes are 2+ on the right side and 2+ on the left side. Strength is normal and equal throughout. Cranial nerves grossly intact. Patient fluent. No gross ataxia and patient able to ambulate without difficulty.  Skin: Skin is warm and dry.  Psychiatric: She has a normal mood and affect. Her behavior is normal. Judgment and thought content normal.  Nursing note and vitals reviewed.    ED Treatments / Results  Labs (all labs ordered are listed, but only abnormal results are displayed) Labs Reviewed  CBC  COMPREHENSIVE METABOLIC PANEL  I-STAT TROPONIN, ED    EKG EKG Interpretation  Date/Time:  Friday July 11 2018 09:42:10 EDT Ventricular Rate:    59 PR Interval:    QRS Duration: 97 QT Interval:  422 QTC Calculation: 418 R Axis:   -55 Text Interpretation:  Normal sinus rhythm T wave inversion Inferolateral leads Confirmed by Pattricia Boss [id number]) on 07/11/2018 10:45:55 AM   Radiology No results found.  Procedures Procedures (including critical care time)  Medications Ordered in ED Medications  nitroGLYCERIN (NITROSTAT) SL tablet 0.4 mg (has no administration in time range)     Initial Impression / Assessment and Plan / ED Course  I  have reviewed the triage vital signs and the nursing notes.  Pertinent labs & imaging results that were available during my care of the patient were reviewed by me and considered in my medical decision making (see chart for details).    67 year old female with multiple risk factors presents today with chest pain with associated T wave inversion in lateral leads on EKG. Patient currently pain-free after aspirin but with no further nitroglycerin.  Patient's heart score 7.  Plan admission for further evaluation. Final Clinical Impressions(s) / ED Diagnoses   Final diagnoses:  Chest pain, unspecified type    ED Discharge Orders    None       Pattricia Boss, MD 07/11/18 1418

## 2018-07-12 ENCOUNTER — Inpatient Hospital Stay (HOSPITAL_COMMUNITY): Payer: PPO

## 2018-07-12 DIAGNOSIS — R079 Chest pain, unspecified: Secondary | ICD-10-CM

## 2018-07-12 DIAGNOSIS — I2510 Atherosclerotic heart disease of native coronary artery without angina pectoris: Secondary | ICD-10-CM

## 2018-07-12 DIAGNOSIS — I6330 Cerebral infarction due to thrombosis of unspecified cerebral artery: Secondary | ICD-10-CM

## 2018-07-12 DIAGNOSIS — I10 Essential (primary) hypertension: Secondary | ICD-10-CM

## 2018-07-12 DIAGNOSIS — E785 Hyperlipidemia, unspecified: Secondary | ICD-10-CM

## 2018-07-12 DIAGNOSIS — I639 Cerebral infarction, unspecified: Secondary | ICD-10-CM

## 2018-07-12 DIAGNOSIS — E669 Obesity, unspecified: Secondary | ICD-10-CM

## 2018-07-12 DIAGNOSIS — H532 Diplopia: Secondary | ICD-10-CM

## 2018-07-12 DIAGNOSIS — I2583 Coronary atherosclerosis due to lipid rich plaque: Secondary | ICD-10-CM

## 2018-07-12 DIAGNOSIS — I361 Nonrheumatic tricuspid (valve) insufficiency: Secondary | ICD-10-CM

## 2018-07-12 DIAGNOSIS — I119 Hypertensive heart disease without heart failure: Secondary | ICD-10-CM

## 2018-07-12 LAB — CBC
HCT: 41.5 % (ref 36.0–46.0)
Hemoglobin: 13.2 g/dL (ref 12.0–15.0)
MCH: 28.7 pg (ref 26.0–34.0)
MCHC: 31.8 g/dL (ref 30.0–36.0)
MCV: 90.2 fL (ref 78.0–100.0)
Platelets: 258 10*3/uL (ref 150–400)
RBC: 4.6 MIL/uL (ref 3.87–5.11)
RDW: 13.5 % (ref 11.5–15.5)
WBC: 4.4 10*3/uL (ref 4.0–10.5)

## 2018-07-12 LAB — NM MYOCAR MULTI W/SPECT W/WALL MOTION / EF
Estimated workload: 1 METS
Exercise duration (min): 5 min
MPHR: 155 {beats}/min
Peak HR: 107 {beats}/min
Percent HR: 69 %
Rest HR: 54 {beats}/min

## 2018-07-12 LAB — HIV ANTIBODY (ROUTINE TESTING W REFLEX): HIV Screen 4th Generation wRfx: NONREACTIVE

## 2018-07-12 LAB — GLUCOSE, CAPILLARY
Glucose-Capillary: 141 mg/dL — ABNORMAL HIGH (ref 70–99)
Glucose-Capillary: 168 mg/dL — ABNORMAL HIGH (ref 70–99)
Glucose-Capillary: 174 mg/dL — ABNORMAL HIGH (ref 70–99)
Glucose-Capillary: 145 mg/dL — ABNORMAL HIGH (ref 70–99)

## 2018-07-12 LAB — HEPARIN LEVEL (UNFRACTIONATED): Heparin Unfractionated: 0.53 IU/mL (ref 0.30–0.70)

## 2018-07-12 LAB — ECHOCARDIOGRAM COMPLETE
Height: 64 in
Weight: 2604.8 oz

## 2018-07-12 LAB — TROPONIN I: Troponin I: <0.03 ng/mL (ref <0.03)

## 2018-07-12 MED ORDER — TECHNETIUM TC 99M TETROFOSMIN IV KIT
30.0000 | PACK | Freq: Once | INTRAVENOUS | Status: AC | PRN
  Administered 2018-07-12: 30 via INTRAVENOUS

## 2018-07-12 MED ORDER — HYDROCHLOROTHIAZIDE 25 MG PO TABS
25.0000 mg | ORAL_TABLET | Freq: Every day | ORAL | Status: DC
  Administered 2018-07-12 – 2018-07-13 (×2): 25 mg via ORAL
  Filled 2018-07-12 (×2): qty 1

## 2018-07-12 MED ORDER — REGADENOSON 0.4 MG/5ML IV SOLN
INTRAVENOUS | Status: AC
  Filled 2018-07-12: qty 5

## 2018-07-12 MED ORDER — ASPIRIN 600 MG RE SUPP
300.0000 mg | Freq: Every day | RECTAL | Status: DC
  Filled 2018-07-12: qty 1

## 2018-07-12 MED ORDER — ASPIRIN 325 MG PO TABS
325.0000 mg | ORAL_TABLET | Freq: Every day | ORAL | Status: DC
  Administered 2018-07-12: 325 mg via ORAL
  Filled 2018-07-12: qty 1

## 2018-07-12 MED ORDER — GADOBENATE DIMEGLUMINE 529 MG/ML IV SOLN
15.0000 mL | Freq: Once | INTRAVENOUS | Status: AC
  Administered 2018-07-12: 15 mL via INTRAVENOUS

## 2018-07-12 MED ORDER — ROSUVASTATIN CALCIUM 20 MG PO TABS
20.0000 mg | ORAL_TABLET | Freq: Every day | ORAL | Status: DC
Start: 2018-07-13 — End: 2018-07-13
  Administered 2018-07-13: 20 mg via ORAL
  Filled 2018-07-12: qty 1

## 2018-07-12 MED ORDER — TECHNETIUM TC 99M TETROFOSMIN IV KIT
10.0000 | PACK | Freq: Once | INTRAVENOUS | Status: AC | PRN
  Administered 2018-07-12: 10 via INTRAVENOUS

## 2018-07-12 MED ORDER — ENOXAPARIN SODIUM 40 MG/0.4ML ~~LOC~~ SOLN
40.0000 mg | SUBCUTANEOUS | Status: DC
  Administered 2018-07-13: 40 mg via SUBCUTANEOUS
  Filled 2018-07-12: qty 0.4

## 2018-07-12 MED ORDER — REGADENOSON 0.4 MG/5ML IV SOLN
0.4000 mg | Freq: Once | INTRAVENOUS | Status: AC
  Administered 2018-07-12: 0.4 mg via INTRAVENOUS
  Filled 2018-07-12: qty 5

## 2018-07-12 MED ORDER — ASPIRIN EC 81 MG PO TBEC
81.0000 mg | DELAYED_RELEASE_TABLET | Freq: Every day | ORAL | Status: DC
  Administered 2018-07-13: 81 mg via ORAL
  Filled 2018-07-12: qty 1

## 2018-07-12 NOTE — Progress Notes (Signed)
Upon assessment pt right eye sensitive to light, pt states she is seeing double vision  MD ordered MRI   MD gave verbal order for cardiac/carb mod diet

## 2018-07-12 NOTE — Progress Notes (Signed)
  Echocardiogram 2D Echocardiogram has been performed.  Dimarco Minkin T Sarahi Borland 07/12/2018, 2:13 PM

## 2018-07-12 NOTE — Progress Notes (Signed)
MD at bedside, update provided to pt and pt family

## 2018-07-12 NOTE — Progress Notes (Signed)
PROGRESS NOTE    Bridget Good  OQH:[id number] DOB: 1951-05-26 DOA: 07/11/2018 PCP: [name]      Brief Narrative:  Bridget Good is a 67 y.o. F with HTN and DM who presents with 1 day diplopia and right eyelid droop, as well as chest discomfort, generalized malaise.  The patient was in her usual state of health until yesterday morning, when she woke noticing diplopia so bad it was difficult to walk down the hall without holding the wall.  This persisted.  Then at some point during the day, she had chest pain, so she came to the ER, where she was found to have TW changes, started on heparin and admitted for rule out.     Assessment & Plan:  Diplopia Right eye ptsosis Right adductor deficit MRI obtained, shows subcentimeter infarct, discussed with Neurology, appears consistent with deficits. -Non-invasive angiography showed no significant atheroclerosis -Echocardiogram showed no cardiogenic source of embolism -Carotid imaging pending   -Lipids ordered -Continue home statin -Check A1c -Aspirin ordered -Atrial fibrillation: not present -tPA not given because stroke severity too mild and outside window -PT eval ordered  -Smoking cessation: not applicable   Skull lesion There is malignant appearing change to the clivus/skull base.  This is nonspecific but highly concerning for cancer.  This was an incidental finding on MRI.   -SPEP ordered -Will need age appropriate cancer screenings if not done, as well as CT C/A/P  HTN BP elevated -Continue home ARB and diltiazem -Restart home HCTZ  Diabetes -Hold home orals -Check A1c -SSI  CKD III Creatinine stable relative to baseline.    DVT prophylaxis: Lovenox Code Status: FULL Family Communication: Husband at bedside and later by phone MDM and disposition Plan: The below labs and imaging reports were reviewed and summarized above.    The patient was admitted with chest pain, ruled out.   It appears she was  initially with two complaints, and by today made clear her diplopia, ptosis were more distressing.  This was followed up with MRI that had the above findings.   Consultants:   Neurology  Procedures:   MRI brain, MRA head, discused with Radiology  Antimicrobials:   None    Subjective: Still with bad diplopia, blurry vision in left eye.  No focal weaknes or numbness.  Normal speech.  Nauseated after stress test, but no chest pain.  Tired.  Objective: Vitals:   07/12/18 1016 07/12/18 1019 07/12/18 1021 07/12/18 1223  BP: 136/81 (!) 166/75 (!) 158/75 (!) 163/78  Pulse:    66  Resp:    20  Temp:    99.1 F (37.3 C)  TempSrc:    Oral  SpO2:    98%  Weight:      Height:        Intake/Output Summary (Last 24 hours) at 07/12/2018 1746 Last data filed at 07/12/2018 0800 Gross per 24 hour  Intake 240 ml  Output 600 ml  Net -360 ml   Filed Weights   07/11/18 1017 07/11/18 1654 07/12/18 0521  Weight: 74.8 kg (165 lb) 74.5 kg (164 lb 3.2 oz) 73.8 kg (162 lb 12.8 oz)    Examination: General appearance:  adult female, alert and in no acute distress.   HEENT: Anicteric, conjunctiva pink, lids and lashes normal. No nasal deformity, discharge, epistaxis.  Lips moist, edentulous, OP moist no oral lesions, hearing normal.   Skin: Warm and dry.   No suspicious rashes or lesions. Cardiac: RRR, nl S1-S2, no murmurs appreciated.  Capillary refill is brisk.  JVP not visible.  No LE edema.  Radia  pulses 2+ and symmetric. Respiratory: Normal respiratory rate and rhythm.  CTAB without rales or wheezes. Abdomen: Abdomen soft.  no TTP. No ascites, distension, hepatosplenomegaly.   MSK: No deformities or effusions. Neuro: Awake and alert.  RIght eye ptsosis.  Right eye adduction weak, left eye nystagmus.  Cranial nerves 5-12 intact. 5/5 strength in upp erna dlower extremities.Speech fluent.    Psych: Sensorium intact and responding to questions, attention normal. Affect normal.  Judgment and  insight appear normal.    Data Reviewed: I have personally reviewed following labs and imaging studies:  CBC: Recent Labs  Lab 07/11/18 1117 07/12/18 0302  WBC 5.4 4.4  HGB 13.7 13.2  HCT 42.8 41.5  MCV 91.5 90.2  PLT 244 850   Basic Metabolic Panel: Recent Labs  Lab 07/11/18 1117  NA 142  K 3.5  CL 106  CO2 27  GLUCOSE 145*  BUN 15  CREATININE 1.35*  CALCIUM 9.0   GFR: Estimated Creatinine Clearance: 40.9 mL/min (A) (by C-G formula based on SCr of 1.35 mg/dL (H)). Liver Function Tests: Recent Labs  Lab 07/11/18 1117  AST 20  ALT 16  ALKPHOS 74  BILITOT 1.1  PROT 6.2*  ALBUMIN 3.4*   No results for input(s): LIPASE, AMYLASE in the last 168 hours. No results for input(s): AMMONIA in the last 168 hours. Coagulation Profile: No results for input(s): INR, PROTIME in the last 168 hours. Cardiac Enzymes: Recent Labs  Lab 07/11/18 1521 07/11/18 2108 07/12/18 0302  TROPONINI <0.03 <0.03 <0.03   BNP (last 3 results) No results for input(s): PROBNP in the last 8760 hours. HbA1C: Recent Labs    07/11/18 1521  HGBA1C 7.6*   CBG: Recent Labs  Lab 07/11/18 1702 07/11/18 2127 07/12/18 0732 07/12/18 1222 07/12/18 1645  GLUCAP 87 148* 141* 168* 174*   Lipid Profile: No results for input(s): CHOL, HDL, LDLCALC, TRIG, CHOLHDL, LDLDIRECT in the last 72 hours. Thyroid Function Tests: No results for input(s): TSH, T4TOTAL, FREET4, T3FREE, THYROIDAB in the last 72 hours. Anemia Panel: No results for input(s): VITAMINB12, FOLATE, FERRITIN, TIBC, IRON, RETICCTPCT in the last 72 hours. Urine analysis:    Component Value Date/Time   COLORURINE STRAW (A) 05/27/2018 0826   APPEARANCEUR CLEAR 05/27/2018 0826   LABSPEC 1.008 05/27/2018 0826   PHURINE 6.0 05/27/2018 0826   GLUCOSEU NEGATIVE 05/27/2018 0826   HGBUR NEGATIVE 05/27/2018 0826   BILIRUBINUR NEGATIVE 05/27/2018 0826   KETONESUR NEGATIVE 05/27/2018 0826   PROTEINUR NEGATIVE 05/27/2018 0826    UROBILINOGEN 1.0 05/26/2015 1103   NITRITE NEGATIVE 05/27/2018 0826   LEUKOCYTESUR NEGATIVE 05/27/2018 0826   Sepsis Labs: @LABRCNTIP (procalcitonin:4,lacticacidven:4)  )No results found for this or any previous visit (from the past 240 hour(s)).       Radiology Studies: Ct Head Wo Contrast  Result Date: 07/11/2018 CLINICAL DATA:  Acute vision change.  Heparin infusion. EXAM: CT HEAD WITHOUT CONTRAST TECHNIQUE: Contiguous axial images were obtained from the base of the skull through the vertex without intravenous contrast. COMPARISON:  Head CT 12/17/2016 FINDINGS: Brain: There is no mass, hemorrhage or extra-axial collection. The size and configuration of the ventricles and extra-axial CSF spaces are normal. There is no acute or chronic infarction. The brain parenchyma is normal. Vascular: No abnormal hyperdensity of the major intracranial arteries or dural venous sinuses. No intracranial atherosclerosis. Skull: The visualized skull base, calvarium and extracranial soft tissues are normal. Sinuses/Orbits: No  fluid levels or advanced mucosal thickening of the visualized paranasal sinuses. No mastoid or middle ear effusion. The orbits are normal. IMPRESSION: Normal head CT. Electronically Signed   By: Ulyses Jarred M.D.   On: 07/11/2018 21:26   Mr Jeri Cos PX Contrast  Addendum Date: 07/12/2018   ADDENDUM REPORT: 07/12/2018 17:30 ADDENDUM: Dr. Loleta Books called to discuss this case. The patient has a right third nerve palsy. With this information, re-evaluating the pons, there is a punctate acute infarction in the right para median pons, 2 mm in size. Electronically Signed   By: Nelson Chimes M.D.   On: 07/12/2018 17:30   Result Date: 07/12/2018 CLINICAL DATA:  Acute visual disturbance.  Negative head CT. EXAM: MRI HEAD WITHOUT AND WITH CONTRAST MRA HEAD WITHOUT CONTRAST TECHNIQUE: Multiplanar, multiecho pulse sequences of the brain and surrounding structures were obtained without and with intravenous  contrast. Angiographic images of the head were obtained using MRA technique without contrast. CONTRAST:  27mL MULTIHANCE GADOBENATE DIMEGLUMINE 529 MG/ML IV SOLN COMPARISON:  Head CT 07/11/2018.  MRI 05/26/2015. FINDINGS: MRI HEAD FINDINGS Brain: Diffusion imaging does not show any acute or subacute infarction. There chronic small-vessel ischemic changes affecting the pons. No focal cerebellar insult. Cerebral hemispheres show chronic small-vessel ischemic changes of the thalami, basal ganglia and hemispheric deep and subcortical white matter. No cortical or large vessel territory infarction. No mass lesion, hemorrhage, hydrocephalus or extra-axial collection. After contrast administration, no abnormal enhancement occurs. Vascular: Major vessels at the base of the brain show flow. Skull and upper cervical spine: No calvarial lesions seen.Abnormal appearance of the clivus, new since the study of 2016. No visible CT correlate. This is worrisome for the possibility of metastatic disease or myeloma. Sinuses/Orbits: Clear/normal Other: None MRA HEAD FINDINGS Both internal carotid arteries are patent through the skull base and siphon regions. There is signal loss in the carotid siphon regions which may be artifactual. Cannot rule out siphon stenosis. The anterior and middle cerebral vessels are patent. No correctable proximal stenosis. Both vertebral arteries are patent to the basilar. No basilar stenosis. Posterior circulation branch vessels show flow. IMPRESSION: No acute brain finding. Chronic small-vessel ischemic changes throughout the brain. Intracranial MR angiography does not show any large or medium vessel occlusion or correctable proximal stenosis. Signal loss in the carotid siphon regions is probably artifactual, but I cannot rule out a siphon stenosis. Abnormal marrow signal of the clivus, new since 2016 and without CT correlate. This could be due to metastatic disease or myeloma. Electronically Signed: By:  Nelson Chimes M.D. On: 07/12/2018 16:51   Nm Myocar Multi W/spect W/wall Motion / Ef  Result Date: 07/12/2018  There was no ST segment deviation noted during stress.  Nuclear stress EF: 66%. No wall motion abnormalities  The study is normal.  This is a low risk study. There were no perfusion defect suggestive of infarct or ischemia.  Candee Furbish, MD   Dg Chest Port 1 View  Result Date: 07/11/2018 CLINICAL DATA:  Chest pain EXAM: PORTABLE CHEST 1 VIEW COMPARISON:  05/27/2018 FINDINGS: The heart size and mediastinal contours are within normal limits. Both lungs are clear. The visualized skeletal structures are unremarkable. IMPRESSION: No active disease. Electronically Signed   By: Franchot Gallo M.D.   On: 07/11/2018 11:44   Mr Jodene Nam Head Wo Contrast  Addendum Date: 07/12/2018   ADDENDUM REPORT: 07/12/2018 17:30 ADDENDUM: Dr. Loleta Books called to discuss this case. The patient has a right third nerve palsy. With this information,  re-evaluating the pons, there is a punctate acute infarction in the right para median pons, 2 mm in size. Electronically Signed   By: Nelson Chimes M.D.   On: 07/12/2018 17:30   Result Date: 07/12/2018 CLINICAL DATA:  Acute visual disturbance.  Negative head CT. EXAM: MRI HEAD WITHOUT AND WITH CONTRAST MRA HEAD WITHOUT CONTRAST TECHNIQUE: Multiplanar, multiecho pulse sequences of the brain and surrounding structures were obtained without and with intravenous contrast. Angiographic images of the head were obtained using MRA technique without contrast. CONTRAST:  7mL MULTIHANCE GADOBENATE DIMEGLUMINE 529 MG/ML IV SOLN COMPARISON:  Head CT 07/11/2018.  MRI 05/26/2015. FINDINGS: MRI HEAD FINDINGS Brain: Diffusion imaging does not show any acute or subacute infarction. There chronic small-vessel ischemic changes affecting the pons. No focal cerebellar insult. Cerebral hemispheres show chronic small-vessel ischemic changes of the thalami, basal ganglia and hemispheric deep and subcortical  white matter. No cortical or large vessel territory infarction. No mass lesion, hemorrhage, hydrocephalus or extra-axial collection. After contrast administration, no abnormal enhancement occurs. Vascular: Major vessels at the base of the brain show flow. Skull and upper cervical spine: No calvarial lesions seen.Abnormal appearance of the clivus, new since the study of 2016. No visible CT correlate. This is worrisome for the possibility of metastatic disease or myeloma. Sinuses/Orbits: Clear/normal Other: None MRA HEAD FINDINGS Both internal carotid arteries are patent through the skull base and siphon regions. There is signal loss in the carotid siphon regions which may be artifactual. Cannot rule out siphon stenosis. The anterior and middle cerebral vessels are patent. No correctable proximal stenosis. Both vertebral arteries are patent to the basilar. No basilar stenosis. Posterior circulation branch vessels show flow. IMPRESSION: No acute brain finding. Chronic small-vessel ischemic changes throughout the brain. Intracranial MR angiography does not show any large or medium vessel occlusion or correctable proximal stenosis. Signal loss in the carotid siphon regions is probably artifactual, but I cannot rule out a siphon stenosis. Abnormal marrow signal of the clivus, new since 2016 and without CT correlate. This could be due to metastatic disease or myeloma. Electronically Signed: By: Nelson Chimes M.D. On: 07/12/2018 16:51        Scheduled Meds: . aspirin  300 mg Rectal Daily   Or  . aspirin  325 mg Oral Daily  . diltiazem  240 mg Oral Daily  . insulin aspart  0-5 Units Subcutaneous QHS  . insulin aspart  0-9 Units Subcutaneous TID WC  . irbesartan  300 mg Oral Daily  . pantoprazole  40 mg Oral Q1200  . potassium chloride SA  20 mEq Oral Daily  . regadenoson      . rosuvastatin  10 mg Oral Daily   Continuous Infusions:   LOS: 1 day    Time spent: 35 minutes    Edwin Dada,  MD [facility] 07/12/2018, 5:46 PM     Pager [phone] --- please page though AMION:  [email or web address] If 7PM-7AM, please contact night-coverage

## 2018-07-12 NOTE — Progress Notes (Signed)
Pt at MRI with SWOT RN

## 2018-07-12 NOTE — Consult Note (Signed)
NEURO HOSPITALIST      Requesting Physician: Dr. Loleta Books    Chief Complaint: Chest pain  History obtained from:  Patient and Family  HPI:                                                                                                                                         Bridget Good is an 67 y.o. female PMH significant for HTN, HLD, DM  who presented to [facility] for chest pain initially. Neurology consulted for right eye ptosis and double vision. A tiny acute right paramedian pontine lacunar infarction was seen on MRI.  On Thursday evening patient began noticing sudden onset of  diplopia. She stated that if she closed her left eye her vision would clear up. She thought nothing of it and went to bed. Then Friday morning she woke up and the diplopia was worse. She woke up at about 0700 to use the bathroom. At that time the diplopia was so bad that she was holding onto things to walk. She also saw two toilets and had to feel around for where the toilet was located. Patient stated that she did have a HA Friday morning during this event. Denies any problems swallowing, chewing, numbness, weakness, tingling, blurred vision, SOB. She did notice that her right arm felt really heavy and she had chest pain. This chest pain is what eventually brought her in to the hospital. Endorses missing doses of ASA 81 mg and BP medication. Denies ETOH, drug use, or any blood thinning medications.  ED course: BP:175/84 BG:174, A1c: 7.6 CT head 07-12-18: normal head CT MRI brain: acute infarct in right paramedian pons.  MRA head:  No large or medium vessel occlusion or correctable proximal stenosis.   No previous stroke history noted.    Date last known well: Thursday 07-10-18 Time last known well: evening tPA Given: No: contraindicated; outside of window.  Modified Rankin: Rankin Score=1  NIHSS:0 1a Level of Conscious:0 1b LOC Questions: 0 1c LOC Commands: 0 2  Best Gaze: 0 3 Visual: 0 4 Facial Palsy: 0 5a Motor Arm - left: 0 5b Motor Arm - Right: 0 6a Motor Leg - Left: 0 6b Motor Leg - Right: 0 7 Limb Ataxia: 0 8 Sensory: 0 9 Best Language: 0 10 Dysarthria:0 11 Extinct. and Inattention:0 TOTAL: 0    Past Medical History:  Diagnosis Date  . Angina   . Bronchitis   . Diabetes mellitus   . Fibromyalgia   . GERD (gastroesophageal reflux disease)   . Headache(784.0)   . Hyperlipidemia   . Hypertension   . Hypertensive heart disease without  CHF   . Lupus ([place])    "treated for it from 1992 til 2012; dr said I don't have it anymore"  . Obesity (BMI 30-39.9)   . Osteoarthritis   . Pneumonia   . Shortness of breath    lying down  . Shortness of breath on exertion     Past Surgical History:  Procedure Laterality Date  . ABDOMINAL HYSTERECTOMY     partial  . CARDIAC CATHETERIZATION  ~ 2007  . CESAREAN SECTION  1978; 1981  . COLONOSCOPY    . FOOT SURGERY     "had to cut it to let the fluids out; it had swollen very badly; left foot"    Family History  Problem Relation Age of Onset  . Heart attack Father   . Diabetes Father   . Hypertension Mother   . Heart attack Brother   . Kidney failure Brother   . Diabetes Sister   . Diabetes Sister        Social History:  reports that she has never smoked. She has never used smokeless tobacco. She reports that she does not drink alcohol or use drugs.  Allergies:  Allergies  Allergen Reactions  . Sulfa Antibiotics Hives, Itching and Nausea And Vomiting    Medications:                                                                                                                           Scheduled: . aspirin  300 mg Rectal Daily   Or  . aspirin  325 mg Oral Daily  . diltiazem  240 mg Oral Daily  . hydrochlorothiazide  25 mg Oral Daily  . insulin aspart  0-5 Units Subcutaneous QHS  . insulin aspart  0-9 Units Subcutaneous TID WC  . irbesartan  300 mg Oral Daily  .  pantoprazole  40 mg Oral Q1200  . potassium chloride SA  20 mEq Oral Daily  . regadenoson      . rosuvastatin  10 mg Oral Daily   Continuous:  IDP:OEUMPNTIRWERX, ALPRAZolam, gi cocktail, morphine injection, nitroGLYCERIN, nitroGLYCERIN, ondansetron (ZOFRAN) IV   ROS:                                                                                                                                       History obtained from the patient  General ROS: negative for -  chills, fatigue, fever, night sweats, weight gain or weight loss Ophthalmic ROS: positive for double vision,  Respiratory ROS: negative for - cough,  shortness of breath or wheezing Cardiovascular ROS: negative for - chest pain, dyspnea on exertion,  Musculoskeletal ROS: negative for - joint swelling or muscular weakness Neurological ROS: as noted in HPI   General Examination:                                                                                                      Blood pressure (!) 163/78, pulse 66, temperature 99.1 F (37.3 C), temperature source Oral, resp. rate 20, height 5\' 4"  (1.626 m), weight 73.8 kg (162 lb 12.8 oz), SpO2 98 %.  HEENT-  Normocephalic, no lesions, without obvious abnormality.  Normal external eye and conjunctiva. Right eye with ptosis Cardiovascular- S1-S2 audible, pulses palpable throughout   Lungs-no rhonchi or wheezing noted, no excessive working breathing.  Saturations within normal limits on RA Extremities- Warm, dry and intact Musculoskeletal-no joint tenderness, deformity or swelling Skin-warm and dry, intact  Neurological Examination Mental Status: Alert, oriented, thought content appropriate.  Speech fluent without evidence of aphasia.  Able to follow commands without difficulty. Cranial Nerves: II:  Visual fields grossly normal. PERRL.  III,IV, VI: ptosis present in right eye,  Gaze dysconjugate at midline with exotropia. Able to gaze to left and right as well as vertically,  with lag of right eye on upgaze and leftward deviation relative to left eye. Patient diplopia worse with upward gaze and left lateral gaze. No nystagmus.  V,VII: smile symmetric, facial light touch sensation normal bilaterally VIII: hearing normal bilaterally IX,X: uvula rises symmetrically XI: bilateral shoulder shrug XII: midline tongue extension Motor: Right : Upper extremity   5/5    Left:     Upper extremity   5/5  Lower extremity   5/5     Lower extremity   5/5 Tone and bulk:normal tone throughout; no atrophy noted Sensory: temperature and light touch intact throughout, bilaterally Deep Tendon Reflexes: 2+ and symmetric biceps; patellar Plantars: Right: downgoing   Left: downgoing Cerebellar: normal finger-to-nose  Gait: deferred   Lab Results: Basic Metabolic Panel: Recent Labs  Lab 07/11/18 1117  NA 142  K 3.5  CL 106  CO2 27  GLUCOSE 145*  BUN 15  CREATININE 1.35*  CALCIUM 9.0    CBC: Recent Labs  Lab 07/11/18 1117 07/12/18 0302  WBC 5.4 4.4  HGB 13.7 13.2  HCT 42.8 41.5  MCV 91.5 90.2  PLT 244 258    Lipid Panel: No results for input(s): CHOL, TRIG, HDL, CHOLHDL, VLDL, LDLCALC in the last 168 hours.  CBG: Recent Labs  Lab 07/11/18 1702 07/11/18 2127 07/12/18 0732 07/12/18 1222 07/12/18 1645  GLUCAP 87 148* 141* 168* 174*    Imaging: Ct Head Wo Contrast  Result Date: 07/11/2018 CLINICAL DATA:  Acute vision change.  Heparin infusion. EXAM: CT HEAD WITHOUT CONTRAST TECHNIQUE: Contiguous axial images were obtained from the base of the skull through the vertex without intravenous contrast. COMPARISON:  Head CT 12/17/2016 FINDINGS: Brain: There  is no mass, hemorrhage or extra-axial collection. The size and configuration of the ventricles and extra-axial CSF spaces are normal. There is no acute or chronic infarction. The brain parenchyma is normal. Vascular: No abnormal hyperdensity of the major intracranial arteries or dural venous sinuses. No  intracranial atherosclerosis. Skull: The visualized skull base, calvarium and extracranial soft tissues are normal. Sinuses/Orbits: No fluid levels or advanced mucosal thickening of the visualized paranasal sinuses. No mastoid or middle ear effusion. The orbits are normal. IMPRESSION: Normal head CT. Electronically Signed   By: Ulyses Jarred M.D.   On: 07/11/2018 21:26   Mr Jeri Cos IP Contrast  Addendum Date: 07/12/2018   ADDENDUM REPORT: 07/12/2018 17:30 ADDENDUM: Dr. Loleta Books called to discuss this case. The patient has a right third nerve palsy. With this information, re-evaluating the pons, there is a punctate acute infarction in the right para median pons, 2 mm in size. Electronically Signed   By: Nelson Chimes M.D.   On: 07/12/2018 17:30   Result Date: 07/12/2018 CLINICAL DATA:  Acute visual disturbance.  Negative head CT. EXAM: MRI HEAD WITHOUT AND WITH CONTRAST MRA HEAD WITHOUT CONTRAST TECHNIQUE: Multiplanar, multiecho pulse sequences of the brain and surrounding structures were obtained without and with intravenous contrast. Angiographic images of the head were obtained using MRA technique without contrast. CONTRAST:  55mL MULTIHANCE GADOBENATE DIMEGLUMINE 529 MG/ML IV SOLN COMPARISON:  Head CT 07/11/2018.  MRI 05/26/2015. FINDINGS: MRI HEAD FINDINGS Brain: Diffusion imaging does not show any acute or subacute infarction. There chronic small-vessel ischemic changes affecting the pons. No focal cerebellar insult. Cerebral hemispheres show chronic small-vessel ischemic changes of the thalami, basal ganglia and hemispheric deep and subcortical white matter. No cortical or large vessel territory infarction. No mass lesion, hemorrhage, hydrocephalus or extra-axial collection. After contrast administration, no abnormal enhancement occurs. Vascular: Major vessels at the base of the brain show flow. Skull and upper cervical spine: No calvarial lesions seen.Abnormal appearance of the clivus, new since the study  of 2016. No visible CT correlate. This is worrisome for the possibility of metastatic disease or myeloma. Sinuses/Orbits: Clear/normal Other: None MRA HEAD FINDINGS Both internal carotid arteries are patent through the skull base and siphon regions. There is signal loss in the carotid siphon regions which may be artifactual. Cannot rule out siphon stenosis. The anterior and middle cerebral vessels are patent. No correctable proximal stenosis. Both vertebral arteries are patent to the basilar. No basilar stenosis. Posterior circulation branch vessels show flow. IMPRESSION: No acute brain finding. Chronic small-vessel ischemic changes throughout the brain. Intracranial MR angiography does not show any large or medium vessel occlusion or correctable proximal stenosis. Signal loss in the carotid siphon regions is probably artifactual, but I cannot rule out a siphon stenosis. Abnormal marrow signal of the clivus, new since 2016 and without CT correlate. This could be due to metastatic disease or myeloma. Electronically Signed: By: Nelson Chimes M.D. On: 07/12/2018 16:51   Nm Myocar Multi W/spect W/wall Motion / Ef  Result Date: 07/12/2018  There was no ST segment deviation noted during stress.  Nuclear stress EF: 66%. No wall motion abnormalities  The study is normal.  This is a low risk study. There were no perfusion defect suggestive of infarct or ischemia.  Candee Furbish, MD   Dg Chest Port 1 View  Result Date: 07/11/2018 CLINICAL DATA:  Chest pain EXAM: PORTABLE CHEST 1 VIEW COMPARISON:  05/27/2018 FINDINGS: The heart size and mediastinal contours are within normal limits. Both lungs  are clear. The visualized skeletal structures are unremarkable. IMPRESSION: No active disease. Electronically Signed   By: Franchot Gallo M.D.   On: 07/11/2018 11:44   Mr Jodene Nam Head Wo Contrast  Addendum Date: 07/12/2018   ADDENDUM REPORT: 07/12/2018 17:30 ADDENDUM: Dr. Loleta Books called to discuss this case. The patient has a right  third nerve palsy. With this information, re-evaluating the pons, there is a punctate acute infarction in the right para median pons, 2 mm in size. Electronically Signed   By: Nelson Chimes M.D.   On: 07/12/2018 17:30   Result Date: 07/12/2018 CLINICAL DATA:  Acute visual disturbance.  Negative head CT. EXAM: MRI HEAD WITHOUT AND WITH CONTRAST MRA HEAD WITHOUT CONTRAST TECHNIQUE: Multiplanar, multiecho pulse sequences of the brain and surrounding structures were obtained without and with intravenous contrast. Angiographic images of the head were obtained using MRA technique without contrast. CONTRAST:  31mL MULTIHANCE GADOBENATE DIMEGLUMINE 529 MG/ML IV SOLN COMPARISON:  Head CT 07/11/2018.  MRI 05/26/2015. FINDINGS: MRI HEAD FINDINGS Brain: Diffusion imaging does not show any acute or subacute infarction. There chronic small-vessel ischemic changes affecting the pons. No focal cerebellar insult. Cerebral hemispheres show chronic small-vessel ischemic changes of the thalami, basal ganglia and hemispheric deep and subcortical white matter. No cortical or large vessel territory infarction. No mass lesion, hemorrhage, hydrocephalus or extra-axial collection. After contrast administration, no abnormal enhancement occurs. Vascular: Major vessels at the base of the brain show flow. Skull and upper cervical spine: No calvarial lesions seen.Abnormal appearance of the clivus, new since the study of 2016. No visible CT correlate. This is worrisome for the possibility of metastatic disease or myeloma. Sinuses/Orbits: Clear/normal Other: None MRA HEAD FINDINGS Both internal carotid arteries are patent through the skull base and siphon regions. There is signal loss in the carotid siphon regions which may be artifactual. Cannot rule out siphon stenosis. The anterior and middle cerebral vessels are patent. No correctable proximal stenosis. Both vertebral arteries are patent to the basilar. No basilar stenosis. Posterior  circulation branch vessels show flow. IMPRESSION: No acute brain finding. Chronic small-vessel ischemic changes throughout the brain. Intracranial MR angiography does not show any large or medium vessel occlusion or correctable proximal stenosis. Signal loss in the carotid siphon regions is probably artifactual, but I cannot rule out a siphon stenosis. Abnormal marrow signal of the clivus, new since 2016 and without CT correlate. This could be due to metastatic disease or myeloma. Electronically Signed: By: Nelson Chimes M.D. On: 07/12/2018 16:51    Laurey Morale, MSN, NP-C [facility] [phone] 07/12/2018, 5:58 PM    Assessment: 67 y.o. female with PMH significant for HTN, HLD, DM who presented to [facility] for chest pain initially. Neurology consulted for right eye ptosis and double vision.  1. MRI reveals a punctate acute infarction in the right paramedian pons, 2 mm in size. 2. Etiology for the lacunar infarctoin is most likely small vessel disease secondary to uncontrolled risk factors such as uncontrolled DM, and uncontrolled HTN. 3. Stroke Risk Factors - diabetes mellitus, hyperlipidemia and hypertension    Recommendations: -- BP goal : normotensive  -- Echocardiogram -- Carotid ultrasound -- ASA 81 mg qd. Encourage patient compliance -- High intensity Statin -- HgbA1c, fasting lipid panel -- PT consult, OT consult, Speech consult --Telemetry monitoring --Frequent neuro checks --Stroke swallow screen  --Please page stroke NP  Or  PA  Or MD from 8am -4 pm  as this patient from this time will be  followed by the  stroke.   You can look them up on [email or web address]  Password [email or web address]  I have seen and examined the patient. I have amended the assessment and recommendations above. Electronically signed: Dr. Kerney Elbe

## 2018-07-12 NOTE — Progress Notes (Signed)
Called MRI again regarding pt STAT scan   MRI stated they will send for pt next

## 2018-07-12 NOTE — Progress Notes (Deleted)
Pt transferred to MRI with SWOT RN

## 2018-07-12 NOTE — Progress Notes (Signed)
Progress Note  Patient Name: Bridget Good Date of Encounter: 07/12/2018  Primary Cardiologist: No primary care provider on file.   Subjective   She has some chest soreness in the upper retrosternal region.  She is having problems with double vision. The patient's mother expresses concerns there is a strong family history of heart disease in the patient's father and brother.  Inpatient Medications    Scheduled Meds: . diltiazem  240 mg Oral Daily  . insulin aspart  0-5 Units Subcutaneous QHS  . insulin aspart  0-9 Units Subcutaneous TID WC  . irbesartan  300 mg Oral Daily  . pantoprazole  40 mg Oral Q1200  . potassium chloride SA  20 mEq Oral Daily  . regadenoson      . rosuvastatin  10 mg Oral Daily   Continuous Infusions: . heparin 900 Units/hr (07/11/18 1700)   PRN Meds: acetaminophen, ALPRAZolam, gi cocktail, morphine injection, nitroGLYCERIN, nitroGLYCERIN, ondansetron (ZOFRAN) IV   Vital Signs    Vitals:   07/12/18 1016 07/12/18 1019 07/12/18 1021 07/12/18 1223  BP: 136/81 (!) 166/75 (!) 158/75 (!) 163/78  Pulse:    66  Resp:    20  Temp:    99.1 F (37.3 C)  TempSrc:    Oral  SpO2:    98%  Weight:      Height:        Intake/Output Summary (Last 24 hours) at 07/12/2018 1339 Last data filed at 07/12/2018 0800 Gross per 24 hour  Intake 291.19 ml  Output 1100 ml  Net -808.81 ml   Filed Weights   07/11/18 1017 07/11/18 1654 07/12/18 0521  Weight: 165 lb (74.8 kg) 164 lb 3.2 oz (74.5 kg) 162 lb 12.8 oz (73.8 kg)    Telemetry    Sinus rhythm- Personally Reviewed   Physical Exam   GEN: No acute distress.   Neck: No JVD Cardiac: RRR, no murmurs, rubs, or gallops.  Respiratory: Clear to auscultation bilaterally. GI: Soft, nontender, non-distended  MS: No edema; No deformity. Neuro:  Nonfocal  Psych: Normal affect   Labs    Chemistry Recent Labs  Lab 07/11/18 1117  NA 142  K 3.5  CL 106  CO2 27  GLUCOSE 145*  BUN 15  CREATININE 1.35*    CALCIUM 9.0  PROT 6.2*  ALBUMIN 3.4*  AST 20  ALT 16  ALKPHOS 74  BILITOT 1.1  GFRNONAA 40*  GFRAA 47*  ANIONGAP 9     Hematology Recent Labs  Lab 07/11/18 1117 07/12/18 0302  WBC 5.4 4.4  RBC 4.68 4.60  HGB 13.7 13.2  HCT 42.8 41.5  MCV 91.5 90.2  MCH 29.3 28.7  MCHC 32.0 31.8  RDW 13.5 13.5  PLT 244 258    Cardiac Enzymes Recent Labs  Lab 07/11/18 1521 07/11/18 2108 07/12/18 0302  TROPONINI <0.03 <0.03 <0.03    Recent Labs  Lab 07/11/18 1131  TROPIPOC 0.01     BNPNo results for input(s): BNP, PROBNP in the last 168 hours.   DDimer No results for input(s): DDIMER in the last 168 hours.   Radiology    Ct Head Wo Contrast  Result Date: 07/11/2018 CLINICAL DATA:  Acute vision change.  Heparin infusion. EXAM: CT HEAD WITHOUT CONTRAST TECHNIQUE: Contiguous axial images were obtained from the base of the skull through the vertex without intravenous contrast. COMPARISON:  Head CT 12/17/2016 FINDINGS: Brain: There is no mass, hemorrhage or extra-axial collection. The size and configuration of the ventricles and  extra-axial CSF spaces are normal. There is no acute or chronic infarction. The brain parenchyma is normal. Vascular: No abnormal hyperdensity of the major intracranial arteries or dural venous sinuses. No intracranial atherosclerosis. Skull: The visualized skull base, calvarium and extracranial soft tissues are normal. Sinuses/Orbits: No fluid levels or advanced mucosal thickening of the visualized paranasal sinuses. No mastoid or middle ear effusion. The orbits are normal. IMPRESSION: Normal head CT. Electronically Signed   By: Ulyses Jarred M.D.   On: 07/11/2018 21:26   Nm Myocar Multi W/spect W/wall Motion / Ef  Result Date: 07/12/2018  There was no ST segment deviation noted during stress.  Nuclear stress EF: 66%. No wall motion abnormalities  The study is normal.  This is a low risk study. There were no perfusion defect suggestive of infarct or  ischemia.  Candee Furbish, MD   Dg Chest Port 1 View  Result Date: 07/11/2018 CLINICAL DATA:  Chest pain EXAM: PORTABLE CHEST 1 VIEW COMPARISON:  05/27/2018 FINDINGS: The heart size and mediastinal contours are within normal limits. Both lungs are clear. The visualized skeletal structures are unremarkable. IMPRESSION: No active disease. Electronically Signed   By: Franchot Gallo M.D.   On: 07/11/2018 11:44    Cardiac Studies   Nuclear stress test 07/12/2018:   There was no ST segment deviation noted during stress.  Nuclear stress EF: 66%. No wall motion abnormalities  The study is normal.  This is a low risk study. There were no perfusion defect suggestive of infarct or ischemia.  Echocardiogram is pending  Patient Profile     67 y.o. female with a hx of hypertension, hyperlipidemia, diabetes mellitus who is being seen today for the evaluation of chest discomfort at the request of Dr Daryll Drown .  Assessment & Plan    1.  Chest pain: Troponins are normal.  Nuclear stress test is also normal with no evidence of ischemia or scar, LVEF 66%.  I will discontinue heparin.  Echocardiogram is pending.  2.  Hypertension: Blood pressure is elevated.  She is on long-acting diltiazem and Avapro.  She may require additional antihypertensive therapy.  3.  Hyperlipidemia: Currently on Crestor.  4.  Diplopia: Internal medicine has ordered an MRI which is pending.    [facility] will sign off.   Medication Recommendations:  Optimize BP control Other recommendations (labs, testing, etc):  Echo pending. Follow up as an outpatient:  With Dr. Acie Fredrickson.  For questions or updates, please contact [place] Please consult [email or web address] for contact info under Cardiology/STEMI.      Signed, Kate Sable, MD  07/12/2018, 1:39 PM

## 2018-07-12 NOTE — Progress Notes (Signed)
ANTICOAGULATION CONSULT NOTE - Ronald for heparin Indication: chest pain/ACS  Allergies  Allergen Reactions  . Sulfa Antibiotics Hives, Itching and Nausea And Vomiting    Patient Measurements: Height: 5\' 4"  (162.6 cm) Weight: 162 lb 12.8 oz (73.8 kg) IBW/kg (Calculated) : 54.7 Heparin Dosing Weight: 70.3kg  Vital Signs: Temp: 98.2 F (36.8 C) (08/03 0521) Temp Source: Oral (08/03 0521) BP: 143/93 (08/03 0521) Pulse Rate: 56 (08/03 0521)  Labs: Recent Labs    07/11/18 1117 07/11/18 1521 07/11/18 2108 07/12/18 0302  HGB 13.7  --   --  13.2  HCT 42.8  --   --  41.5  PLT 244  --   --  258  HEPARINUNFRC  --   --  0.60 0.53  CREATININE 1.35*  --   --   --   TROPONINI  --  <0.03 <0.03 <0.03    Estimated Creatinine Clearance: 40.9 mL/min (A) (by C-G formula based on SCr of 1.35 mg/dL (H)).   Medical History: Past Medical History:  Diagnosis Date  . Angina   . Bronchitis   . Diabetes mellitus   . Fibromyalgia   . GERD (gastroesophageal reflux disease)   . Headache(784.0)   . Hyperlipidemia   . Hypertension   . Hypertensive heart disease without CHF   . Lupus ([place])    "treated for it from 1992 til 2012; dr said I don't have it anymore"  . Obesity (BMI 30-39.9)   . Osteoarthritis   . Pneumonia   . Shortness of breath    lying down  . Shortness of breath on exertion     Medications:  Infusions:  . heparin 900 Units/hr (07/11/18 1700)    Assessment: 58 yof presented to the ED with CP. Pharmacy consulted for heparin for r/o ACS. Baseline CBC is WNL, and she is not on anticoagulation PTA.   Heparin level therapeutic at 0.53. H/H stable, platelets WNL, and no documented bleeding. As the patient has now had two consecutive HLs at goal, will continue the drip at current rate of 900 units/hr and recheck HL daily.  Goal of Therapy:  Heparin level 0.3-0.7 units/ml Monitor platelets by anticoagulation protocol: Yes   Plan:   -Continue heparin at 900 units/hr -daily HL, CBC, monitor s/sx bleeding   Brendolyn Patty, PharmD PGY1 Pharmacy Resident Phone [phone]  07/12/2018   7:52 AM

## 2018-07-12 NOTE — Progress Notes (Signed)
Pt at stress test  Lab on unit to draw troponin   MD aware MD gave verbal to discontinue all troponin's

## 2018-07-12 NOTE — Progress Notes (Signed)
Inpatient Diabetes Program Recommendations  AACE/ADA: New Consensus Statement on Inpatient Glycemic Control (2019)  Target Ranges:  Prepandial:   less than 140 mg/dL      Peak postprandial:   less than 180 mg/dL (1-2 hours)      Critically ill patients:  140 - 180 mg/dL  Results for CHANTILLY, LINSKEY (MRN [id number]) as of 07/12/2018 08:07  Ref. Range 07/11/2018 17:02 07/11/2018 21:27 07/12/2018 07:32  Glucose-Capillary Latest Ref Range: 70 - 99 mg/dL 87 148 (H) 141 (H)   Results for MEGAN, PRESTI (MRN [id number]) as of 07/12/2018 08:07  Ref. Range 07/11/2018 15:21  Hemoglobin A1C Latest Ref Range: 4.8 - 5.6 % 7.6 (H)   Review of Glycemic Control  Diabetes history: DM2 Outpatient Diabetes medications: None Current orders for Inpatient glycemic control: Novolog 0-9 units TID with meals, Novolog 0-5 units QHS  Inpatient Diabetes Program Recommendations:  HgbA1C: A1C 7.6% on 07/11/18 indicating an average glucose of 171 mg/dl over the past 2-3 months. MD may want to consider discharging patient on oral DM medication at time of discharge and have patient follow up with PCP.  NOTE: Noted consult for Diabetes Coordinator for A1C 7.6%. Diabetes Coordinator is not on campus over the weekend by available by pager from 8am to 5pm for questions or concerns. Patient has DM2 hx but currently no DM medications listed on home medication list. In reviewing chart, noted ED note on 05/10/18, has Glyburide 2.5 mg daily listed on home medication list but not listed on ED note on 05/27/18. If patient is no longer taking oral DM medication as an outpatient she likely needs at least an oral DM medication as an outpatient.  Thanks, Barnie Alderman, RN, MSN, CDE Diabetes Coordinator Inpatient Diabetes Program [phone] (Team Pager from 8am to 5pm)

## 2018-07-12 NOTE — Progress Notes (Signed)
  Nuclear stress test completed. Interpretation pending. [facility] to read images.

## 2018-07-12 NOTE — Progress Notes (Signed)
Report given to nuc med  Transport on way

## 2018-07-12 NOTE — Progress Notes (Signed)
Called MRI regarding what time pt will be taken down   MRI stated several STAT exams ahead

## 2018-07-13 ENCOUNTER — Inpatient Hospital Stay (HOSPITAL_COMMUNITY): Payer: PPO

## 2018-07-13 DIAGNOSIS — M899 Disorder of bone, unspecified: Secondary | ICD-10-CM

## 2018-07-13 DIAGNOSIS — I639 Cerebral infarction, unspecified: Secondary | ICD-10-CM

## 2018-07-13 LAB — CBC
HCT: 41.2 % (ref 36.0–46.0)
Hemoglobin: 13.2 g/dL (ref 12.0–15.0)
MCH: 29 pg (ref 26.0–34.0)
MCHC: 32 g/dL (ref 30.0–36.0)
MCV: 90.5 fL (ref 78.0–100.0)
Platelets: 202 10*3/uL (ref 150–400)
RBC: 4.55 MIL/uL (ref 3.87–5.11)
RDW: 13.5 % (ref 11.5–15.5)
WBC: 5.4 10*3/uL (ref 4.0–10.5)

## 2018-07-13 LAB — LIPID PANEL
Cholesterol: 208 mg/dL — ABNORMAL HIGH (ref 0–200)
HDL: 60 mg/dL (ref >40)
LDL Cholesterol: 128 mg/dL — ABNORMAL HIGH (ref 0–99)
Total CHOL/HDL Ratio: 3.5 RATIO
Triglycerides: 98 mg/dL (ref <150)
VLDL: 20 mg/dL (ref 0–40)

## 2018-07-13 LAB — GLUCOSE, CAPILLARY
Glucose-Capillary: 106 mg/dL — ABNORMAL HIGH (ref 70–99)
Glucose-Capillary: 103 mg/dL — ABNORMAL HIGH (ref 70–99)

## 2018-07-13 MED ORDER — CLOPIDOGREL BISULFATE 75 MG PO TABS: 75.0000 mg | ORAL_TABLET | Freq: Every day | ORAL | 11 refills | Status: DC

## 2018-07-13 MED ORDER — ROSUVASTATIN CALCIUM 20 MG PO TABS: 20.0000 mg | ORAL_TABLET | Freq: Every day | ORAL | 3 refills | Status: DC

## 2018-07-13 NOTE — Evaluation (Signed)
Physical Therapy Evaluation Patient Details Name: Bridget Good MRN: [id number] DOB: 08/28/1951 Today's Date: 07/13/2018   History of Present Illness  Bridget Good is a 67 y.o. female with medical history significant for Hypertension, hyperlipidemia, diabetes, GERD, bronchitis, angina presents to the ED chief complaint of chest pain and doube vision. 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  Clinical Impression  Pt admitted with above. Pt reports the double vision to be much better. Pt functioning near baseline. Pt with difficulty amb due to L knee arthritis and pain. Pt to benefit form RW instead of cane to improve support and pain management with WBing. Acute PT to cont to follow.    Follow Up Recommendations No PT follow up;Supervision for mobility/OOB    Equipment Recommendations  Rolling walker with 5" wheels;3in1 (PT)    Recommendations for Other Services       Precautions / Restrictions Precautions Precautions: Fall Precaution Comments: pt with arthritic L knee Restrictions Weight Bearing Restrictions: No      Mobility  Bed Mobility Overal bed mobility: Modified Independent             General bed mobility comments: no difficulty  Transfers Overall transfer level: Needs assistance Equipment used: None Transfers: Sit to/from Stand Sit to Stand: Min guard         General transfer comment: increased time, L knee stiffness from arthritis, antalgia, limp but no physical assist  Ambulation/Gait Ambulation/Gait assistance: Min assist Gait Distance (Feet): 10 Feet(to the bathroom, 75 down the halll) Assistive device: 1 person hand held assist Gait Pattern/deviations: Step-through pattern;Antalgic Gait velocity: slow Gait velocity interpretation: >2.62 ft/sec, indicative of community ambulatory General Gait Details: pt holding onto furniture to amb to bathroom due to L knee pain and stiffness, s/p using bathroom pt with improved  ambulation but still with limp, used L HHA to mimic cane at home, limited by tranporter coming to take pt to test  Stairs            Wheelchair Mobility    Modified Rankin (Stroke Patients Only) Modified Rankin (Stroke Patients Only) Pre-Morbid Rankin Score: No significant disability Modified Rankin: No significant disability     Balance Overall balance assessment: Mild deficits observed, not formally tested(pt able to stand at sink and wash hands without difficulty)                                           Pertinent Vitals/Pain Pain Assessment: No/denies pain    Home Living Family/patient expects to be discharged to:: Private residence Living Arrangements: Spouse/significant other Available Help at Discharge: Family;Available 24 hours/day Type of Home: House Home Access: Stairs to enter Entrance Stairs-Rails: Can reach both Entrance Stairs-Number of Steps: 2 Home Layout: One level Home Equipment: Cane - single point      Prior Function Level of Independence: Needs assistance   Gait / Transfers Assistance Needed: uses straight cane, some difficulty getting in/out otub  ADL's / Homemaking Assistance Needed: indep, uses shower chair in bathroom        Hand Dominance   Dominant Hand: Right    Extremity/Trunk Assessment   Upper Extremity Assessment Upper Extremity Assessment: Overall WFL for tasks assessed    Lower Extremity Assessment Lower Extremity Assessment: Overall WFL for tasks assessed(except L knee flex/ext limited by onset of pain)    Cervical / Trunk Assessment  Cervical / Trunk Assessment: Normal  Communication   Communication: No difficulties  Cognition Arousal/Alertness: Awake/alert Behavior During Therapy: WFL for tasks assessed/performed Overall Cognitive Status: Within Functional Limits for tasks assessed                                        General Comments General comments (skin integrity,  edema, etc.): VSS, pt denies double vision with PT, pt tolieted on her own as well    Exercises     Assessment/Plan    PT Assessment Patient needs continued PT services  PT Problem List Decreased strength;Decreased activity tolerance;Decreased balance;Decreased mobility;Pain;Decreased knowledge of use of DME       PT Treatment Interventions DME instruction;Gait training;Stair training;Functional mobility training;Therapeutic activities;Therapeutic exercise;Balance training;Neuromuscular re-education    PT Goals (Current goals can be found in the Care Plan section)  Acute Rehab PT Goals Patient Stated Goal: to go home PT Goal Formulation: With patient Time For Goal Achievement: 07/27/18 Potential to Achieve Goals: Good    Frequency Min 3X/week   Barriers to discharge        Co-evaluation               AM-PAC PT "6 Clicks" Daily Activity  Outcome Measure Difficulty turning over in bed (including adjusting bedclothes, sheets and blankets)?: None Difficulty moving from lying on back to sitting on the side of the bed? : None Difficulty sitting down on and standing up from a chair with arms (e.g., wheelchair, bedside commode, etc,.)?: None Help needed moving to and from a bed to chair (including a wheelchair)?: A Little Help needed walking in hospital room?: A Little Help needed climbing 3-5 steps with a railing? : A Little 6 Click Score: 21    End of Session Equipment Utilized During Treatment: Gait belt Activity Tolerance: Patient tolerated treatment well Patient left: in chair(with transporter) Nurse Communication: Mobility status PT Visit Diagnosis: Unsteadiness on feet (R26.81)    Time: 1050-1104 PT Time Calculation (min) (ACUTE ONLY): 14 min   Charges:   PT Evaluation $PT Eval Low Complexity: 1 Low          Kittie Plater, PT, DPT Pager #: [phone] Office #: [phone]   [place] 07/13/2018, 11:18 AM

## 2018-07-13 NOTE — Discharge Summary (Signed)
Physician Discharge Summary  Bridget Good QIO:[id number] DOB: 23-Dec-1950 DOA: 07/11/2018  PCP: [name]  Admit date: 07/11/2018 Discharge date: 07/13/2018  Admitted From: Home  Disposition:  Home   Recommendations for Outpatient Follow-up:  1. Follow up with PCP in 1 week 2. For skull lesion noted incidentally on MRI:  1. Please follow up on SPEP and serum free light chains (Kappa/Lambda) for multiple myeloma screening 2. Please refer patient for CT of Chest abdomen and pelvis with contrast to eval for primary (if metastatic) 3. Please refer patient for all age appropriate cancer screenings: mammogram and colonoscopy if not up to date 4. If not primary lesion found on those tests, please seek Oncologic consultation 3. Please check BP in 1 week and change diltiazem to Amlodipine if BP not <130/90 mmHg or add fourth agent 4. Please start metformin or refer for dietitian counseling 5. Repeat Hgb A1c in 3 months 6. Repeat Lipids in 3 months on new dose Crestor       Home Health: None  Equipment/Devices: 3-in-1, rolling walker  Discharge Condition: Fair  CODE STATUS: FULL Diet recommendation: Cardiac, diabetic  Brief/Interim Summary: Bridget Good is a 67 y.o. F with HTN and DM who presents with 1 day diplopia and right eyelid droop, as well as chest discomfort, generalized malaise.  The patient was in her usual state of health until yesterday morning, when she woke noticing diplopia so bad it was difficult to walk down the hall without holding the wall.  This persisted.  Then at some point during the day, she had chest pain, so she came to the ER, where she was found to have TW changes, started on heparin and admitted for rule out.       Discharge Diagnoses:   Lacunar pontine stroke MRI obtained, shows subcentimeter infarct.  Deficits attributed to this include diplopia and ptosis. -Non-invasive angiography showed no significant atheroclerosis -Echocardiogram showed no  cardiogenic source of embolism -Carotid imaging showed <40% stenosis. -Lipids showed LDL 128, Crestor augmented -A1c not at goal; recommend metformin vs dietitian -Discharged on aspirin and Plavix for 3 weeks then Plavix alone -Atrial fibrillation: not present -tPA not given because stroke severity too mild and outside window -PT eval ordered  -Smoking cessation: not applicable   Chest pain ECG with nonspecific changes only.  Troponins negative.  Lexiscan nuclear stress was low risk.  Echocardiogram was unremarkable.  Cardiology evaluated patient, felt her chest pain did not represent ACS.  Skull lesion This was an incidental finding.  There is malignant appearing change to the clivus/skull base.  This is nonspecific but highly concerning for cancer.    -SPEP pending -Will need age appropriate cancer screenings if not done, as well as CT C/A/P -Discussed with patient, close follow up with PCP  HTN BP permissively elevated due to stroke. Will need normalization in 5-7 days, additional agent or changing diltiazem to amlodipine per PCP.    Diabetes HgbA1c 7.6%, not at goal.  CKD III Creatinine stable relative to baseline.        Discharge Instructions  Discharge Instructions    Diet - low sodium heart healthy   Complete by:  As directed    Discharge instructions   Complete by:  As directed    From Dr. Loleta Books: You were admitted to the hospital with double vision and chest discomfort.  For the chest discomfort, we ruled out (the first night) that you were having an active heart attack by your ECG (the electrical activity  of your heart) and your blood work (looking for troponin, a sensitive blood marker of heart injury). Dr. Court Joy team evaluated your stress test and echocardiogram (ultrasound of heart) and these were both low risk, indicating no risk for an impending heart attack.  For your double vision, I wasn't expecting it, but your MRI showed a very small  stroke.   Our further testing with ultrasound of your heart and neck arteries showed nothing concerning. I am glad the double vision improved.  If it were to recur, ask your primary doctor for a referral to an occupational therapist.  To prevent future strokes, it is important to manage your diabetes, blood pressure, and to take an aspirin-like medicine.  1. For the next three weeks, take aspirin 81 mg daily PLUS clopidogrel/Plavix 75 mg daily -After three weeks, stop the aspirin and replace it with just Plavix 75 mg daily  (Plavix is similar to aspirin, but more effective) Follow up with [facility] in 4 weeks  2. Continue your home blood pressure medicines Check your blood pressure at home and at [facility] office this week. If your blood pressure doesn't come down to 130/90 mmHg or less, [name] should change the diltiazem to amlodipine or add a fourth medicine  3. For your diabetes, you should either visit a dietitian and strictly control your diet, or start a medicine like metformin or linagliptin to lower your hemoglobin A1c. I will ask [name] to refer you to a dietitian.  4. You should control your cholesterol Increase your Crestor to 20 mg daily Have [name] check your cholesterol in 3 months     Lastly, for the lesion in your skull, this needs follow up. Make sure to call [facility] office this week and have her follow up the results of the outstanding blood work. Have her order for you a CT (cat scan) of the chest abdomen and pelvis.  I have sent her this information in your discharge summary.   Increase activity slowly   Complete by:  As directed      Allergies as of 07/13/2018      Reactions   Sulfa Antibiotics Hives, Itching, Nausea And Vomiting      Medication List    TAKE these medications   acetaminophen 500 MG tablet Commonly known as:  TYLENOL Take 500 mg by mouth as needed for mild pain.   aspirin EC 81 MG tablet Take 81 mg by  mouth daily as needed for mild pain.   clopidogrel 75 MG tablet Commonly known as:  PLAVIX Take 1 tablet (75 mg total) by mouth daily.   diltiazem 240 MG 24 hr capsule Commonly known as:  CARDIZEM CD Take 1 capsule (240 mg total) by mouth daily.   KLOR-CON M20 20 MEQ tablet Generic drug:  potassium chloride SA Take 20 mEq by mouth daily.   nitroGLYCERIN 0.4 MG SL tablet Commonly known as:  NITROSTAT Place 1 tablet (0.4 mg total) under the tongue every 5 (five) minutes as needed for chest pain.   pantoprazole 40 MG tablet Commonly known as:  PROTONIX Take 1 tablet (40 mg total) by mouth daily at 12 noon.   rosuvastatin 20 MG tablet Commonly known as:  CRESTOR Take 1 tablet (20 mg total) by mouth daily. Start taking on:  07/14/2018 What changed:    medication strength  how much to take   valsartan-hydrochlorothiazide 320-25 MG tablet Commonly known as:  DIOVAN-HCT Take 1 tablet by mouth daily.  Durable Medical Equipment  (From admission, onward)        Start     Ordered   07/13/18 1455  DME 3-in-1  Once     07/13/18 1509   07/13/18 1455  For home use only DME Walker rolling  [facility])  Once    Question:  Patient needs a walker to treat with the following condition  Answer:  Ambulatory dysfunction   07/13/18 1509      Allergies  Allergen Reactions  . Sulfa Antibiotics Hives, Itching and Nausea And Vomiting    Consultations:  Cardiology  Neurology   Procedures/Studies: Ct Head Wo Contrast  Result Date: 07/11/2018 CLINICAL DATA:  Acute vision change.  Heparin infusion. EXAM: CT HEAD WITHOUT CONTRAST TECHNIQUE: Contiguous axial images were obtained from the base of the skull through the vertex without intravenous contrast. COMPARISON:  Head CT 12/17/2016 FINDINGS: Brain: There is no mass, hemorrhage or extra-axial collection. The size and configuration of the ventricles and extra-axial CSF spaces are normal. There is no acute or chronic  infarction. The brain parenchyma is normal. Vascular: No abnormal hyperdensity of the major intracranial arteries or dural venous sinuses. No intracranial atherosclerosis. Skull: The visualized skull base, calvarium and extracranial soft tissues are normal. Sinuses/Orbits: No fluid levels or advanced mucosal thickening of the visualized paranasal sinuses. No mastoid or middle ear effusion. The orbits are normal. IMPRESSION: Normal head CT. Electronically Signed   By: Ulyses Jarred M.D.   On: 07/11/2018 21:26   Mr Jeri Cos BZ Contrast  Addendum Date: 07/12/2018   ADDENDUM REPORT: 07/12/2018 17:30 ADDENDUM: Dr. Loleta Books called to discuss this case. The patient has a right third nerve palsy. With this information, re-evaluating the pons, there is a punctate acute infarction in the right para median pons, 2 mm in size. Electronically Signed   By: Nelson Chimes M.D.   On: 07/12/2018 17:30   Result Date: 07/12/2018 CLINICAL DATA:  Acute visual disturbance.  Negative head CT. EXAM: MRI HEAD WITHOUT AND WITH CONTRAST MRA HEAD WITHOUT CONTRAST TECHNIQUE: Multiplanar, multiecho pulse sequences of the brain and surrounding structures were obtained without and with intravenous contrast. Angiographic images of the head were obtained using MRA technique without contrast. CONTRAST:  55m MULTIHANCE GADOBENATE DIMEGLUMINE 529 MG/ML IV SOLN COMPARISON:  Head CT 07/11/2018.  MRI 05/26/2015. FINDINGS: MRI HEAD FINDINGS Brain: Diffusion imaging does not show any acute or subacute infarction. There chronic small-vessel ischemic changes affecting the pons. No focal cerebellar insult. Cerebral hemispheres show chronic small-vessel ischemic changes of the thalami, basal ganglia and hemispheric deep and subcortical white matter. No cortical or large vessel territory infarction. No mass lesion, hemorrhage, hydrocephalus or extra-axial collection. After contrast administration, no abnormal enhancement occurs. Vascular: Major vessels at the  base of the brain show flow. Skull and upper cervical spine: No calvarial lesions seen.Abnormal appearance of the clivus, new since the study of 2016. No visible CT correlate. This is worrisome for the possibility of metastatic disease or myeloma. Sinuses/Orbits: Clear/normal Other: None MRA HEAD FINDINGS Both internal carotid arteries are patent through the skull base and siphon regions. There is signal loss in the carotid siphon regions which may be artifactual. Cannot rule out siphon stenosis. The anterior and middle cerebral vessels are patent. No correctable proximal stenosis. Both vertebral arteries are patent to the basilar. No basilar stenosis. Posterior circulation branch vessels show flow. IMPRESSION: No acute brain finding. Chronic small-vessel ischemic changes throughout the brain. Intracranial MR angiography does not show any large or  medium vessel occlusion or correctable proximal stenosis. Signal loss in the carotid siphon regions is probably artifactual, but I cannot rule out a siphon stenosis. Abnormal marrow signal of the clivus, new since 2016 and without CT correlate. This could be due to metastatic disease or myeloma. Electronically Signed: By: Nelson Chimes M.D. On: 07/12/2018 16:51   Nm Myocar Multi W/spect W/wall Motion / Ef  Result Date: 07/12/2018  There was no ST segment deviation noted during stress.  Nuclear stress EF: 66%. No wall motion abnormalities  The study is normal.  This is a low risk study. There were no perfusion defect suggestive of infarct or ischemia.  Candee Furbish, MD   Dg Chest Port 1 View  Result Date: 07/11/2018 CLINICAL DATA:  Chest pain EXAM: PORTABLE CHEST 1 VIEW COMPARISON:  05/27/2018 FINDINGS: The heart size and mediastinal contours are within normal limits. Both lungs are clear. The visualized skeletal structures are unremarkable. IMPRESSION: No active disease. Electronically Signed   By: Franchot Gallo M.D.   On: 07/11/2018 11:44   Mr Jodene Nam Head Wo  Contrast  Addendum Date: 07/12/2018   ADDENDUM REPORT: 07/12/2018 17:30 ADDENDUM: Dr. Loleta Books called to discuss this case. The patient has a right third nerve palsy. With this information, re-evaluating the pons, there is a punctate acute infarction in the right para median pons, 2 mm in size. Electronically Signed   By: Nelson Chimes M.D.   On: 07/12/2018 17:30   Result Date: 07/12/2018 CLINICAL DATA:  Acute visual disturbance.  Negative head CT. EXAM: MRI HEAD WITHOUT AND WITH CONTRAST MRA HEAD WITHOUT CONTRAST TECHNIQUE: Multiplanar, multiecho pulse sequences of the brain and surrounding structures were obtained without and with intravenous contrast. Angiographic images of the head were obtained using MRA technique without contrast. CONTRAST:  38m MULTIHANCE GADOBENATE DIMEGLUMINE 529 MG/ML IV SOLN COMPARISON:  Head CT 07/11/2018.  MRI 05/26/2015. FINDINGS: MRI HEAD FINDINGS Brain: Diffusion imaging does not show any acute or subacute infarction. There chronic small-vessel ischemic changes affecting the pons. No focal cerebellar insult. Cerebral hemispheres show chronic small-vessel ischemic changes of the thalami, basal ganglia and hemispheric deep and subcortical white matter. No cortical or large vessel territory infarction. No mass lesion, hemorrhage, hydrocephalus or extra-axial collection. After contrast administration, no abnormal enhancement occurs. Vascular: Major vessels at the base of the brain show flow. Skull and upper cervical spine: No calvarial lesions seen.Abnormal appearance of the clivus, new since the study of 2016. No visible CT correlate. This is worrisome for the possibility of metastatic disease or myeloma. Sinuses/Orbits: Clear/normal Other: None MRA HEAD FINDINGS Both internal carotid arteries are patent through the skull base and siphon regions. There is signal loss in the carotid siphon regions which may be artifactual. Cannot rule out siphon stenosis. The anterior and middle  cerebral vessels are patent. No correctable proximal stenosis. Both vertebral arteries are patent to the basilar. No basilar stenosis. Posterior circulation branch vessels show flow. IMPRESSION: No acute brain finding. Chronic small-vessel ischemic changes throughout the brain. Intracranial MR angiography does not show any large or medium vessel occlusion or correctable proximal stenosis. Signal loss in the carotid siphon regions is probably artifactual, but I cannot rule out a siphon stenosis. Abnormal marrow signal of the clivus, new since 2016 and without CT correlate. This could be due to metastatic disease or myeloma. Electronically Signed: By: MNelson ChimesM.D. On: 07/12/2018 16:51   Echocardiogram LV EF: 60% -   65%  ------------------------------------------------------------------- Indications:  Murmur 785.2.  ------------------------------------------------------------------- History:   PMH:   Coronary artery disease.  Risk factors: Hypertension. Diabetes mellitus. Dyslipidemia.  ------------------------------------------------------------------- Study Conclusions  - Left ventricle: The cavity size was normal. Wall thickness was   increased in a pattern of mild LVH. Systolic function was normal.   The estimated ejection fraction was in the range of 60% to 65%.   Wall motion was normal; there were no regional wall motion   abnormalities. Doppler parameters are consistent with abnormal   left ventricular relaxation (grade 1 diastolic dysfunction). - Aortic valve: Mildly calcified annulus. Trileaflet. - Tricuspid valve: There was mild-moderate regurgitation    Subjective: Feeling well. Diplopia improved.  No focal weakness, numbness, confusion.  No headache, chest pain, back pain.    Discharge Exam: Vitals:   07/13/18 0936 07/13/18 1155  BP: 131/84 (!) 151/87  Pulse: 61 (!) 55  Resp:  20  Temp:  98.6 F (37 C)  SpO2:  99%   Vitals:   07/13/18 0007 07/13/18 0502  07/13/18 0936 07/13/18 1155  BP: 136/78 (!) 144/79 131/84 (!) 151/87  Pulse: (!) 51  61 (!) 55  Resp: '18 18  20  ' Temp: 98.4 F (36.9 C) 98.7 F (37.1 C)  98.6 F (37 C)  TempSrc: Oral Oral  Oral  SpO2: 97% 100%  99%  Weight:  73.4 kg (161 lb 12.8 oz)    Height:        General: Pt is alert, awake, not in acute distress, lying in bed, sitting on edge Cardiovascular: RRR, S1/S2 +, no rubs, no gallops Respiratory: CTA bilaterally, no wheezing, no rhonchi Abdominal: Soft, NT, ND, bowel sounds + Extremities: no edema, no cyanosis Neuro: Cranial nerves 3-12 now look normal. Dysconjugate gaze resolved.  Ptosis resolved.  Arm and leg strength 5/5 and symmetric.      The results of significant diagnostics from this hospitalization (including imaging, microbiology, ancillary and laboratory) are listed below for reference.     Microbiology: No results found for this or any previous visit (from the past 240 hour(s)).   Labs: BNP (last 3 results) No results for input(s): BNP in the last 8760 hours. Basic Metabolic Panel: Recent Labs  Lab 07/11/18 1117  NA 142  K 3.5  CL 106  CO2 27  GLUCOSE 145*  BUN 15  CREATININE 1.35*  CALCIUM 9.0   Liver Function Tests: Recent Labs  Lab 07/11/18 1117  AST 20  ALT 16  ALKPHOS 74  BILITOT 1.1  PROT 6.2*  ALBUMIN 3.4*   No results for input(s): LIPASE, AMYLASE in the last 168 hours. No results for input(s): AMMONIA in the last 168 hours. CBC: Recent Labs  Lab 07/11/18 1117 07/12/18 0302 07/13/18 0516  WBC 5.4 4.4 5.4  HGB 13.7 13.2 13.2  HCT 42.8 41.5 41.2  MCV 91.5 90.2 90.5  PLT 244 258 202   Cardiac Enzymes: Recent Labs  Lab 07/11/18 1521 07/11/18 2108 07/12/18 0302  TROPONINI <0.03 <0.03 <0.03   BNP: Invalid input(s): POCBNP CBG: Recent Labs  Lab 07/12/18 1222 07/12/18 1645 07/12/18 2148 07/13/18 0726 07/13/18 1156  GLUCAP 168* 174* 145* 106* 103*   D-Dimer No results for input(s): DDIMER in the last  72 hours. Hgb A1c Recent Labs    07/11/18 1521  HGBA1C 7.6*   Lipid Profile Recent Labs    07/13/18 0516  CHOL 208*  HDL 60  LDLCALC 128*  TRIG 98  CHOLHDL 3.5   Thyroid function studies No results for input(s):  TSH, T4TOTAL, T3FREE, THYROIDAB in the last 72 hours.  Invalid input(s): FREET3 Anemia work up No results for input(s): VITAMINB12, FOLATE, FERRITIN, TIBC, IRON, RETICCTPCT in the last 72 hours. Urinalysis    Component Value Date/Time   COLORURINE STRAW (A) 05/27/2018 0826   APPEARANCEUR CLEAR 05/27/2018 0826   LABSPEC 1.008 05/27/2018 0826   PHURINE 6.0 05/27/2018 0826   GLUCOSEU NEGATIVE 05/27/2018 0826   HGBUR NEGATIVE 05/27/2018 0826   BILIRUBINUR NEGATIVE 05/27/2018 0826   KETONESUR NEGATIVE 05/27/2018 0826   PROTEINUR NEGATIVE 05/27/2018 0826   UROBILINOGEN 1.0 05/26/2015 1103   NITRITE NEGATIVE 05/27/2018 0826   LEUKOCYTESUR NEGATIVE 05/27/2018 0826   Sepsis Labs Invalid input(s): PROCALCITONIN,  WBC,  LACTICIDVEN Microbiology No results found for this or any previous visit (from the past 240 hour(s)).   Time coordinating discharge: 50 minutes       SIGNED:   Edwin Dada, MD  [facility] 07/13/2018, 4:56 PM

## 2018-07-13 NOTE — Progress Notes (Signed)
VASCULAR LAB PRELIMINARY  PRELIMINARY  PRELIMINARY  PRELIMINARY  Carotid duplex completed.    Preliminary report:  1-39% ICA plaquing. Vertebral artery flow  Is antegrade.   Bridget Good, RVT 07/13/2018, 11:40 AM

## 2018-07-13 NOTE — Progress Notes (Addendum)
Stroke swallow screen done by Rapid Response RN, which she passed, no problems with swallowing, will continue to monitor, Thanks Arvella Nigh RN.

## 2018-07-13 NOTE — Progress Notes (Signed)
Pt has orders to be discharged. Discharge instructions given and pt has no additional questions at this time. Medication regimen reviewed and pt educated. Pt verbalized understanding and has no additional questions. Telemetry box removed. IV removed and site in good condition. Pt stable and waiting for transportation and DME.

## 2018-07-13 NOTE — Care Management (Signed)
[place] notified to deliver RW and 3n1 to room for d/c.

## 2018-07-13 NOTE — Progress Notes (Signed)
STROKE TEAM PROGRESS NOTE   HISTORY OF PRESENT ILLNESS (per record) Bridget Good is an 67 y.o. female PMH significant for HTN, HLD, DM  who presented to [facility] for chest pain initially. Neurology consulted for right eye ptosis and double vision. A tiny acute right paramedian pontine lacunar infarction was seen on MRI.  On Thursday evening patient began noticing sudden onset of  diplopia. She stated that if she closed her left eye her vision would clear up. She thought nothing of it and went to bed. Then Friday morning she woke up and the diplopia was worse. She woke up at about 0700 to use the bathroom. At that time the diplopia was so bad that she was holding onto things to walk. She also saw two toilets and had to feel around for where the toilet was located. Patient stated that she did have a HA Friday morning during this event. Denies any problems swallowing, chewing, numbness, weakness, tingling, blurred vision, SOB. She did notice that her right arm felt really heavy and she had chest pain. This chest pain is what eventually brought her in to the hospital. Endorses missing doses of ASA 81 mg and BP medication. Denies ETOH, drug use, or any blood thinning medications.  ED course: BP:175/84 BG:174, A1c: 7.6 CT head 07-12-18: normal head CT MRI brain: acute infarct in right paramedian pons.  MRA head:  No large or medium vessel occlusion or correctable proximal stenosis.  No previous stroke history noted.    SUBJECTIVE (INTERVAL HISTORY) Her symptoms are improved today.    OBJECTIVE Temp:  [98.4 F (36.9 C)-99.1 F (37.3 C)] 98.7 F (37.1 C) (08/04 0502) Pulse Rate:  [51-66] 61 (08/04 0936) Cardiac Rhythm: Sinus bradycardia (08/04 0700) Resp:  [18-20] 18 (08/04 0502) BP: (131-167)/(78-89) 131/84 (08/04 0936) SpO2:  [97 %-100 %] 100 % (08/04 0502) Weight:  [161 lb 12.8 oz (73.4 kg)] 161 lb 12.8 oz (73.4 kg) (08/04 0502)  CBC:  Recent Labs  Lab 07/12/18 0302 07/13/18 0516   WBC 4.4 5.4  HGB 13.2 13.2  HCT 41.5 41.2  MCV 90.2 90.5  PLT 258 626    Basic Metabolic Panel:  Recent Labs  Lab 07/11/18 1117  NA 142  K 3.5  CL 106  CO2 27  GLUCOSE 145*  BUN 15  CREATININE 1.35*  CALCIUM 9.0    Lipid Panel:     Component Value Date/Time   CHOL 208 (H) 07/13/2018 0516   TRIG 98 07/13/2018 0516   HDL 60 07/13/2018 0516   CHOLHDL 3.5 07/13/2018 0516   VLDL 20 07/13/2018 0516   LDLCALC 128 (H) 07/13/2018 0516   HgbA1c:  Lab Results  Component Value Date   HGBA1C 7.6 (H) 07/11/2018   Urine Drug Screen: No results found for: LABOPIA, COCAINSCRNUR, LABBENZ, AMPHETMU, THCU, LABBARB  Alcohol Level No results found for: ETH  IMAGING   Ct Head Wo Contrast 07/11/2018 IMPRESSION:  Normal head CT.   Mr Virgel Paling Wo Contrast 07/12/2018   ADDENDUM: Dr. Loleta Books called to discuss this case. The patient has a right third nerve palsy. With this information, re-evaluating the pons, there is a punctate acute infarction in the right para median pons, 2 mm in size.  07/12/2018 IMPRESSION:  No acute brain finding. Chronic small-vessel ischemic changes throughout the brain. Intracranial MR angiography does not show any large or medium vessel occlusion or correctable proximal stenosis. Signal loss in the carotid siphon regions is probably artifactual, but I cannot rule out  a siphon stenosis. Abnormal marrow signal of the clivus, new since 2016 and without CT correlate. This could be due to metastatic disease or myeloma.    Nm Myocar Multi W/spect W/wall Motion / Ef 07/12/2018 There was no ST segment deviation noted during stress.  Nuclear stress EF: 66%. No wall motion abnormalities  The study is normal.   This is a low risk study. There were no perfusion defect suggestive of infarct or ischemia.     Dg Chest Port 1 View 07/11/2018 IMPRESSION:  No active disease.    Transthoracic Echocardiogram  07/12/2018 Study Conclusions  - Left ventricle: The cavity size  was normal. Wall thickness was   increased in a pattern of mild LVH. Systolic function was normal.   The estimated ejection fraction was in the range of 60% to 65%.   Wall motion was normal; there were no regional wall motion   abnormalities. Doppler parameters are consistent with abnormal   left ventricular relaxation (grade 1 diastolic dysfunction). - Aortic valve: Mildly calcified annulus. Trileaflet. - Tricuspid valve: There was mild-moderate regurgitation.    Bilateral Carotid Dopplers - 1-39% plaquing 00/00/00     PHYSICAL EXAM Vitals:   07/12/18 2004 07/13/18 0007 07/13/18 0502 07/13/18 0936  BP: (!) 167/89 136/78 (!) 144/79 131/84  Pulse: (!) 54 (!) 51  61  Resp: 18 18 18    Temp: 98.9 F (37.2 C) 98.4 F (36.9 C) 98.7 F (37.1 C)   TempSrc: Oral Oral Oral   SpO2: 97% 97% 100%   Weight:   161 lb 12.8 oz (73.4 kg)   Height:         Physical exam: Exam: Gen: NAD, conversant                 CV: RRR, no MRG. No Carotid Bruits. No peripheral edema, warm, nontender Eyes: Conjunctivae clear without exudates or hemorrhage  Neuro: Detailed Neurologic Exam  Speech:    Speech is normal; fluent and spontaneous with normal comprehension.  Cognition:    The patient is oriented to person, place, and time;     recent and remote memory intact;     language fluent;     normal attention, concentration,     fund of knowledge Cranial Nerves:    The pupils are equal, round, and reactive to light. The fundi are normal and spontaneous venous pulsations are present. Visual fields are full to finger confrontation. Ophthalmoplegia  Trigeminal sensation is intact and the muscles of mastication are normal. Right eye ptosis.  The palate elevates in the midline. Hearing intact. Voice is normal. Shoulder shrug is normal. The tongue has normal motion without fasciculations.   Coordination:    Normal finger to nose and heel to shin. Normal rapid alternating movements.   Motor  Observation:    No asymmetry, no atrophy, and no involuntary movements noted. Tone:    Normal muscle tone.    Posture:    Posture is normal. normal erect    Strength:    Strength is V/V in the upper and lower limbs.      Sensation: intact to LT     Reflex Exam:  DTR's:    Deep tendon reflexes in the upper and lower extremities are symmetrical bilaterally.   Toes:    The toes are downgoing bilaterally.   Clonus:    Clonus is absent.           ASSESSMENT/PLAN Ms. ZAHRAH SUTHERLIN is a 67 y.o. female with history  of HTN, Lupus, HLD, DM presenting with Rt ptosis and diplopia. She did not receive IV t-PA due to late presentation.  Stroke: punctate acute infarction in the right para median pons - small vessel disease.  Resultant  Diplopia and ophthalmoplegis  CT head - Normal head CT.  MRI head - punctate acute infarction in the right para median pons, 2 mm in size.  MRA head - cannot rule out a siphon stenosis  Carotid Doppler - 1-39% bilat   2D Echo  - EF 60 - 65%. No cardiac source of emboli identified.   LDL - 128  HgbA1c - 7.6  VTE prophylaxis - Lovenox Diet Order           Diet heart healthy/carb modified Room service appropriate? Yes; Fluid consistency: Thin  Diet effective now          aspirin 81 mg daily prior to admission, now on aspirin 81 mg daily (missed doses PTA). Discharge on ASA 81mg  and Plavix 75mg  or 3 weeks then Plaxix alone  Patient counseled to be compliant with her antithrombotic medications  Ongoing aggressive stroke risk factor management  Therapy recommendations:  pending  Disposition:  Pending  Hypertension  Stable . Permissive hypertension (OK if < 220/120) but gradually normalize in 5-7 days . Long-term BP goal normotensive  Hyperlipidemia  Lipid lowering medication PTA:  Crestor 10 mg daily  LDL 128, goal < 70  Current lipid lowering medication: Crestor 20 mg daily  Continue statin at  discharge  Diabetes  HgbA1c 7.6, goal < 7.0  Uncontrolled  Other Stroke Risk Factors  Advanced age  Overweight, Body mass index is 27.77 kg/m., recommend weight loss, diet and exercise as appropriate   Medical non compliance   Other Active Problems  Creatinine 1.35  Plan:   Discharge on ASA 81mg  and Plavix 75mg  for 3 weeks then Plaxix alone  Stroke team will sign off at this time.  Personally examined patient and images, and have participated in and made any corrections needed to history, physical, neuro exam,assessment and plan as stated above.  I have personally obtained the history, evaluated lab date, reviewed imaging studies and agree with radiology interpretations.    Sarina Ill, MD Stroke Neurology     To contact Stroke Continuity provider, please refer to [email or web address] After hours, contact General Neurology

## 2018-07-13 NOTE — Progress Notes (Signed)
Case management notified regarding need for rollator and 3 in 1.

## 2018-07-14 LAB — PROTEIN ELECTROPHORESIS, SERUM
A/G Ratio: 1.1 (ref 0.7–1.7)
Albumin ELP: 3.3 g/dL (ref 2.9–4.4)
Alpha-1-Globulin: 0.2 g/dL (ref 0.0–0.4)
Alpha-2-Globulin: 0.6 g/dL (ref 0.4–1.0)
Beta Globulin: 1.2 g/dL (ref 0.7–1.3)
Gamma Globulin: 0.9 g/dL (ref 0.4–1.8)
Globulin, Total: 3 g/dL (ref 2.2–3.9)
Total Protein ELP: 6.3 g/dL (ref 6.0–8.5)

## 2018-07-14 LAB — KAPPA/LAMBDA LIGHT CHAINS
Kappa free light chain: 28.3 mg/L — ABNORMAL HIGH (ref 3.3–19.4)
Kappa, lambda light chain ratio: 1.86 — ABNORMAL HIGH (ref 0.26–1.65)
Lambda free light chains: 15.2 mg/L (ref 5.7–26.3)

## 2018-07-16 ENCOUNTER — Ambulatory Visit: Payer: PPO | Admitting: Cardiology

## 2018-07-16 ENCOUNTER — Encounter: Payer: Self-pay | Admitting: Cardiology

## 2018-07-16 VITALS — BP 124/64 | HR 73 | Ht 64.0 in | Wt 152.0 lb

## 2018-07-16 DIAGNOSIS — E559 Vitamin D deficiency, unspecified: Secondary | ICD-10-CM | POA: Diagnosis not present

## 2018-07-16 DIAGNOSIS — I10 Essential (primary) hypertension: Secondary | ICD-10-CM | POA: Diagnosis not present

## 2018-07-16 DIAGNOSIS — E785 Hyperlipidemia, unspecified: Secondary | ICD-10-CM | POA: Diagnosis not present

## 2018-07-16 DIAGNOSIS — I6330 Cerebral infarction due to thrombosis of unspecified cerebral artery: Secondary | ICD-10-CM | POA: Diagnosis not present

## 2018-07-16 DIAGNOSIS — H532 Diplopia: Secondary | ICD-10-CM | POA: Diagnosis not present

## 2018-07-16 DIAGNOSIS — E119 Type 2 diabetes mellitus without complications: Secondary | ICD-10-CM | POA: Diagnosis not present

## 2018-07-16 DIAGNOSIS — R002 Palpitations: Secondary | ICD-10-CM

## 2018-07-16 DIAGNOSIS — I2510 Atherosclerotic heart disease of native coronary artery without angina pectoris: Secondary | ICD-10-CM | POA: Diagnosis not present

## 2018-07-16 DIAGNOSIS — Z09 Encounter for follow-up examination after completed treatment for conditions other than malignant neoplasm: Secondary | ICD-10-CM | POA: Diagnosis not present

## 2018-07-16 NOTE — Assessment & Plan Note (Signed)
Coronary CTA 01/01/12 showed cardiac calcification and moderate LAD and RCA disease Myoview low risk Aug 2019

## 2018-07-16 NOTE — Assessment & Plan Note (Addendum)
Treated Echo showed normal LVF with grade 1 DD

## 2018-07-16 NOTE — Patient Instructions (Signed)
Medication Instructions: Your physician recommends that you continue on your current medications as directed. Please refer to the Current Medication list given to you today.  If you need a refill on your cardiac medications before your next appointment, please call your pharmacy.   Follow-Up: Your physician wants you to follow-up in 3 months with Dr. Acie Fredrickson.  Special Instructions: A referral has been placed to the Electrophysiology department at the [facility] office. They should be calling soon to set up an appointment.   Thank you for choosing Heartcare at [facility]!!

## 2018-07-16 NOTE — Progress Notes (Signed)
07/16/2018 Bridget Good   July 04, 1951  [id number]  Primary Physician [name] Primary Cardiologist: Dr Acie Fredrickson (new)  HPI:  Pleasant 67 y/o AA female, seen by Dr Wynonia Lawman in 2013 for chest pain with an abnormal EKG. Coronary CTA then showed moderate LAD and RCA disease. Other medical problems include HTN and HLD. She presented to [facility] 07/11/18 with double vision and right 3d nerve palsy.  She also had chest pain and cardiology was consulted. Troponin were negative, Myoview low risk, echo normal LVF. Cardiology signed off 8/3.  The pt's MRI done 8/3 showed a punctate acute infarction in the right para median pons. She was seen by Neurology and placed on ASA and Plavix with plans to stop ASA in the future. There was no arrhythmia documented in the hospital.   The pt is seen today for post hospital follow up. She has occasional "cramp" in her chest, it does not sound like angina. She does admit to occasional palpitations, sometimes lasting several minutes.    Current Outpatient Medications  Medication Sig Dispense Refill  . acetaminophen (TYLENOL) 500 MG tablet Take 500 mg by mouth as needed for mild pain.    Marland Kitchen aspirin EC 81 MG tablet Take 81 mg by mouth daily as needed for mild pain.     Marland Kitchen clopidogrel (PLAVIX) 75 MG tablet Take 1 tablet (75 mg total) by mouth daily. 30 tablet 11  . diltiazem (CARDIZEM CD) 240 MG 24 hr capsule Take 1 capsule (240 mg total) by mouth daily. 30 capsule 0  . KLOR-CON M20 20 MEQ tablet Take 20 mEq by mouth daily.   0  . nitroGLYCERIN (NITROSTAT) 0.4 MG SL tablet Place 1 tablet (0.4 mg total) under the tongue every 5 (five) minutes as needed for chest pain. 25 tablet 12  . pantoprazole (PROTONIX) 40 MG tablet Take 1 tablet (40 mg total) by mouth daily at 12 noon. 30 tablet 12  . rosuvastatin (CRESTOR) 20 MG tablet Take 1 tablet (20 mg total) by mouth daily. 30 tablet 3  . valsartan-hydrochlorothiazide (DIOVAN-HCT) 320-25 MG tablet Take 1 tablet by mouth daily. 30  tablet 0   No current facility-administered medications for this visit.     Allergies  Allergen Reactions  . Sulfa Antibiotics Hives, Itching and Nausea And Vomiting    Past Medical History:  Diagnosis Date  . Angina   . Bronchitis   . Diabetes mellitus   . Fibromyalgia   . GERD (gastroesophageal reflux disease)   . Headache(784.0)   . Hyperlipidemia   . Hypertension   . Hypertensive heart disease without CHF   . Lupus ([place])    "treated for it from 1992 til 2012; dr said I don't have it anymore"  . Obesity (BMI 30-39.9)   . Osteoarthritis   . Pneumonia   . Shortness of breath    lying down  . Shortness of breath on exertion     Social History   Socioeconomic History  . Marital status: Married    Spouse name: Not on file  . Number of children: Not on file  . Years of education: Not on file  . Highest education level: Not on file  Occupational History  . Not on file  Social Needs  . Financial resource strain: Not on file  . Food insecurity:    Worry: Not on file    Inability: Not on file  . Transportation needs:    Medical: Not on file    Non-medical: Not  on file  Tobacco Use  . Smoking status: Never Smoker  . Smokeless tobacco: Never Used  Substance and Sexual Activity  . Alcohol use: No    Alcohol/week: 0.0 oz  . Drug use: No  . Sexual activity: Yes    Partners: Male    Birth control/protection: Surgical  Lifestyle  . Physical activity:    Days per week: Not on file    Minutes per session: Not on file  . Stress: Not on file  Relationships  . Social connections:    Talks on phone: Not on file    Gets together: Not on file    Attends religious service: Not on file    Active member of club or organization: Not on file    Attends meetings of clubs or organizations: Not on file    Relationship status: Not on file  . Intimate partner violence:    Fear of current or ex partner: Not on file    Emotionally abused: Not on file    Physically abused: Not  on file    Forced sexual activity: Not on file  Other Topics Concern  . Not on file  Social History Narrative   Married.  Husband is psychotherapist.  Several children.  Husband is Theme park manager of [employer] in [place].     Family History  Problem Relation Age of Onset  . Heart attack Father   . Diabetes Father   . Hypertension Mother   . Heart attack Brother   . Kidney failure Brother   . Diabetes Sister   . Diabetes Sister      Review of Systems: General: negative for chills, fever, night sweats or weight changes.  Cardiovascular: negative for chest pain, dyspnea on exertion, edema, orthopnea, palpitations, paroxysmal nocturnal dyspnea or shortness of breath Dermatological: negative for rash Respiratory: negative for cough or wheezing Urologic: negative for hematuria Abdominal: negative for nausea, vomiting, diarrhea, bright red blood per rectum, melena, or hematemesis Neurologic: negative for visual changes, syncope, or dizziness All other systems reviewed and are otherwise negative except as noted above.    Blood pressure 124/64, pulse 73, height 5\' 4"  (1.626 m), weight 152 lb (68.9 kg).  General appearance: alert, cooperative and no distress Lungs: clear to auscultation bilaterally Heart: regular rate and rhythm Extremities: extremities normal, atraumatic, no cyanosis or edema Skin: Skin color, texture, turgor normal. No rashes or lesions Neurologic: Grossly normal  EKG NSR, NSST changes  ASSESSMENT AND PLAN:   Cerebral thrombosis with cerebral infarction R brain stroke by MRI 07/12/18-no residual  Essential hypertension Treated Echo showed normal LVF with grade 1 DD  Palpitations Pt admitted to episodic palpitations to me today in the office  CAD (coronary artery disease) Coronary CTA 01/01/12 showed cardiac calcification and moderate LAD and RCA disease Myoview low risk Aug 2019   PLAN  I think she should be considered for a Loop recorder to r/o PAF as a  cause of her stroke and will refer her to EP.   Kerin Ransom PA-C 07/16/2018 10:35 AM

## 2018-07-16 NOTE — Assessment & Plan Note (Signed)
R brain stroke by MRI 07/12/18-no residual

## 2018-07-16 NOTE — Assessment & Plan Note (Signed)
Pt admitted to episodic palpitations to me today in the office

## 2018-07-21 ENCOUNTER — Other Ambulatory Visit: Payer: Self-pay | Admitting: Internal Medicine

## 2018-07-21 DIAGNOSIS — Z1231 Encounter for screening mammogram for malignant neoplasm of breast: Secondary | ICD-10-CM

## 2018-07-23 ENCOUNTER — Other Ambulatory Visit: Payer: Self-pay | Admitting: Nurse Practitioner

## 2018-07-29 ENCOUNTER — Ambulatory Visit (INDEPENDENT_AMBULATORY_CARE_PROVIDER_SITE_OTHER): Payer: PPO | Admitting: Neurology

## 2018-07-29 DIAGNOSIS — R0683 Snoring: Secondary | ICD-10-CM | POA: Diagnosis not present

## 2018-07-29 DIAGNOSIS — M2619 Other specified anomalies of jaw-cranial base relationship: Secondary | ICD-10-CM

## 2018-07-29 DIAGNOSIS — F5104 Psychophysiologic insomnia: Secondary | ICD-10-CM

## 2018-08-01 DIAGNOSIS — R51 Headache: Secondary | ICD-10-CM | POA: Diagnosis not present

## 2018-08-01 DIAGNOSIS — R079 Chest pain, unspecified: Secondary | ICD-10-CM | POA: Diagnosis not present

## 2018-08-04 ENCOUNTER — Telehealth: Payer: Self-pay | Admitting: Neurology

## 2018-08-04 DIAGNOSIS — E113391 Type 2 diabetes mellitus with moderate nonproliferative diabetic retinopathy without macular edema, right eye: Secondary | ICD-10-CM | POA: Diagnosis not present

## 2018-08-04 DIAGNOSIS — E113412 Type 2 diabetes mellitus with severe nonproliferative diabetic retinopathy with macular edema, left eye: Secondary | ICD-10-CM | POA: Diagnosis not present

## 2018-08-04 DIAGNOSIS — H35032 Hypertensive retinopathy, left eye: Secondary | ICD-10-CM | POA: Diagnosis not present

## 2018-08-04 DIAGNOSIS — H2511 Age-related nuclear cataract, right eye: Secondary | ICD-10-CM | POA: Diagnosis not present

## 2018-08-04 NOTE — Procedures (Signed)
PATIENT'S NAME:  Bridget Good, Bridget Good DOB:      [birth year]      MR#:    [id number]     DATE OF RECORDING: 07/29/2018 REFERRING M.D.:  [name], FNP/ Glendale Chard, MD Study Performed:   Baseline Polysomnogram HISTORY:  Bridget Good is a 67 year old female presenting with excessive daytime sleepiness and Insomnia, and is at high risk for OSA. History included diagnoses of: Angina pectoris, Bronchitis, Diabetes mellitus, GERD, Headaches, Hyperlipidemia, and Hypertension with Hypertensive heart disease, Obesity, Osteoporosis, Pneumonia, and Shortness of breath and Snoring. She tested positive for rheumatoid factor.  The patient endorsed the Epworth Sleepiness Scale at 12/24 points, FSS at 29/63 points.   The patient's weight 165 pounds with a height of 64 (inches), resulting in a BMI of 28.2 kg/m2. The patient's neck circumference measured 14 inches.  CURRENT MEDICATIONS: Aspirin, Cardizem, Protonix, Crestor, Diovan, Nitrostat.   PROCEDURE:  This is a multichannel digital polysomnogram utilizing the Somnostar 11.2 system.  Electrodes and sensors were applied and monitored per AASM Specifications.   EEG, EOG, Chin and Limb EMG, were sampled at 200 Hz.  ECG, Snore and Nasal Pressure, Thermal Airflow, Respiratory Effort, CPAP Flow and Pressure, Oximetry was sampled at 50 Hz. Digital video and audio were recorded.      BASELINE STUDY: Lights Out was at 22:28 and Lights On at 04:59.  Total recording time (TRT) was 391 minutes, with a total sleep time (TST) of 369.5 minutes. The patient's sleep latency was 29.5 minutes.  REM latency was 28.5 minutes.  The sleep efficiency was 94.5 %.     SLEEP ARCHITECTURE: WASO (Wake after sleep onset) was 11.5 minutes.  There were 4.5 minutes in Stage N1, 251.5 minutes Stage N2, 37.5 minutes Stage N3 and 76 minutes in Stage REM.  The percentage of Stage N1 was 1.2%, Stage N2 was 68.1%, Stage N3 was 10.1% and Stage R (REM sleep) was 20.6%.   RESPIRATORY ANALYSIS:   There were a total of 27 respiratory events:  12 obstructive apneas, 1 central apneas and 14 hypopneas. The patient also had 0 respiratory event related arousals (RERAs).The total APNEA/HYPOPNEA INDEX (AHI) was 4.4/hour and the total RESPIRATORY DISTURBANCE INDEX was 4.4 /hour.  16 events occurred in REM sleep and 16 events in NREM. The REM AHI was 12.6 /hour, versus a non-REM AHI of 2.2. The patient spent 137 minutes of total sleep time in the supine position and 233 minutes in non-supine. The supine AHI was 4.8 versus a non-supine AHI of 4.1.  OXYGEN SATURATION & C02:  The Wake baseline 02 saturation was 97%, with the lowest being 91%. Time spent below 89% saturation equaled 0 minutes.   PERIODIC LIMB MOVEMENTS:  The patient had a total of 0 Periodic Limb Movements.  The arousals were noted as: 29 were spontaneous, 0 were associated with PLMs, and only 1 was associated with respiratory events.  Audio and video analysis did not show any abnormal or unusual movements, behaviors, phonations or vocalizations.  The patient took bathroom breaks. Snoring was noted-EKG was in keeping with normal sinus rhythm (NSR).  Post-study, the patient indicated that sleep was better and more refreshing than usual.    IMPRESSION:  1. No clinically significant degree of Obstructive Sleep Apnea (OSA), Periodic Limb Movement Disorder (PLMD) and only mild- moderate Snoring. No hypoxemia.  2. Normal EKG rhythm with some sinus bradycardia ( not on beta blocker).  3. Over 6 hours of sleep time without fragmentation.  4.  Early REM onset at less than 30 minutes was unusual. (There are no other symptoms of narcolepsy endorsed, and Epworth score is less than 14 / 24, fatigue is not high).   RECOMMENDATIONS:  1. I advise to avoid supine sleep as snoring was louder in that position. 2. Your sleep is not fragmented - at least not in the sleep lab. Are there outside stimuli at home that may cause difficulties to go to sleep  that weren't present here?   You fell asleep before 22.45.   I certify that I have reviewed the entire raw data recording prior to the issuance of this report in accordance with the Standards of Accreditation of the American Academy of Sleep Medicine (AASM)   Larey Seat, MD     08-04-2018  Diplomat, American Board of Psychiatry and Neurology  Diplomat, American Board of [place] Director, [employer] [facility] [employer]

## 2018-08-04 NOTE — Telephone Encounter (Signed)
Called patient to discuss sleep study results. No answer at this time. LVM for the patient to call back.   

## 2018-08-04 NOTE — Telephone Encounter (Signed)
-----   Message from Larey Seat, MD sent at 08/04/2018 10:17 AM EDT ----- IMPRESSION:  1. No clinically significant degree of Obstructive Sleep Apnea (OSA), Periodic Limb Movement Disorder (PLMD) and only mild- moderate Snoring. No hypoxemia.  2. Normal EKG rhythm with some sinus bradycardia ( not on beta blocker).  3. Over 6 hours of sleep time without fragmentation.  4. Early REM onset at less than 30 minutes was unusual. (There are no other symptoms of narcolepsy endorsed, and Epworth score is less than 14 / 24, fatigue is not high).   RECOMMENDATIONS:  1. I advise to avoid supine sleep as snoring was louder in that position. 2. Your sleep is not fragmented - at least not in the sleep lab. Are there outside stimuli at home that may cause difficulties to go to sleep that weren't present here?  You fell asleep before 22.45 hours, you entered REM( dream) sleep early.

## 2018-08-05 NOTE — Telephone Encounter (Signed)
Pt returned call and I was able to review the sleep study with her. Informed her that based on this study this showed that the patient had 6 hours of sleep that was uninterrupted sleep. Informed her no evidence of sleep apnea, restlessness or low oxygenation. Pt verbalized understanding. Advised the patient to avoid sleeping on her back and encouraged her to use a body pillow or something to help with keeping her propped up on her side. Pt had no questions at this time but was encouraged to call back if questions arise. Pt verbalized understanding.

## 2018-08-18 ENCOUNTER — Encounter: Payer: Self-pay | Admitting: Registered"

## 2018-08-18 ENCOUNTER — Encounter: Payer: PPO | Attending: Internal Medicine | Admitting: Registered"

## 2018-08-18 DIAGNOSIS — Z713 Dietary counseling and surveillance: Secondary | ICD-10-CM | POA: Insufficient documentation

## 2018-08-18 DIAGNOSIS — E119 Type 2 diabetes mellitus without complications: Secondary | ICD-10-CM

## 2018-08-18 NOTE — Progress Notes (Addendum)
Diabetes Self-Management Education  Visit Type: First/Initial  Appt. Start Time: 1115 Appt. End Time: 1215  08/18/2018  [name], identified by name and date of birth, is a 67 y.o. female with a diagnosis of Diabetes: Type 2.   This patient is accompanied in the office by her spouse.  ASSESSMENT Pt states she had a stroke 4 weeks ago and has been having trouble with her memory. Pt states when she went into the hospital she was told she was dehydrated. RD educated on the dehydration potential side effect of her DM medication. Pt states she has a hard time remembering to take her BG. Pt states she tries to check am FBG and will also check at night, but not sure why because her MD is not the one who suggested that timing. Pt states sometimes she will check her BG if she doesn't feel well, but could not state what BG number means to her or if she would do anything with the information.  Pt reports osteoarthritis in her left knee, uses cane. Pt report history of falls, but has been better in the last year or 2.  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 Pt states she helps out with her grandchild, sometimes picking him up from daycare when daughter has to work. Patient states driving on the interstate to [place] is very stressful for her.  Pt states she used to take metformin but was taken off awhile ago.   Nutrition related active problems/labs: HTN, CAD, GERD, ,  Vit D 8.5. (Pt states she forgot to pick up her prescription, RD stressed the importance of getting started on this supplement).  Diabetes Self-Management Education - 08/18/18 1123      Visit Information   Visit Type  First/Initial      Initial Visit   Diabetes Type  Type 2    Are you currently following a meal plan?  No    Are you taking your medications as prescribed?  Yes    Date Diagnosed  20+ years ago      Health Coping   How would you rate your overall health?  Fair       Psychosocial Assessment   Patient Belief/Attitude about Diabetes  Afraid    How often do you need to have someone help you when you read instructions, pamphlets, or other written materials from your doctor or pharmacy?  2 - Rarely    What is the last grade level you completed in school?  4 yrs college degree      Complications   Last HgB A1C per patient/outside source  7.5 %   per referral labs   How often do you check your blood sugar?  1-2 times/day    Fasting Blood glucose range (mg/dL)  --   pt couldn't remember, didn't bring log   Have you had a dilated eye exam in the past 12 months?  Yes    Have you had a dental exam in the past 12 months?  No    Are you checking your feet?  Yes    How many days per week are you checking your feet?  5      Dietary Intake   Breakfast  none OR [place] fried chicken biscuit, no sides    Snack (morning)  crackers    Lunch  [employer] 2x week spaghetti & meatballs OR chicken, broccoli, cheese, tea may be sweet or unsweet    Snack (afternoon)  crackers OR peanuts    Dinner  fish, broccoli, potatoes, salad    Snack (evening)  none OR 2-3x week ice cream (butter pecan)    Beverage(s)  water, ginger ale 4 oz, tea      Exercise   Exercise Type  ADL's   a few min of PT knee stretching   How many days per week to you exercise?  0    How many minutes per day do you exercise?  0    Total minutes per week of exercise  0      Patient Education   Previous Diabetes Education  Yes (please comment)   1998   Disease state   Definition of diabetes, type 1 and 2, and the diagnosis of diabetes    Nutrition management   Role of diet in the treatment of diabetes and the relationship between the three main macronutrients and blood glucose level;Food label reading, portion sizes and measuring food.    Physical activity and exercise   Role of exercise on diabetes management, blood pressure control and cardiac health.    Medications  Reviewed patients medication for  diabetes, action, purpose, timing of dose and side effects.    Monitoring  Purpose and frequency of SMBG.    Psychosocial adjustment  Role of stress on diabetes      Individualized Goals (developed by patient)   Nutrition  General guidelines for healthy choices and portions discussed    Monitoring   test my blood glucose as discussed    Reducing Risk  Other (comment)   stay hydrated     Outcomes   Expected Outcomes  Demonstrated interest in learning. Expect positive outcomes    Future DMSE  4-6 wks    Program Status  Not Completed      Individualized Plan for Diabetes Self-Management Training:   Learning Objective:  Patient will have a greater understanding of diabetes self-management. Patient education plan is to attend individual and/or group sessions per assessed needs and concerns.  Patient Instructions  Be sure to pick up your Vitamin D prescription. Because you are not able to take metformin, look into inositol because it is an insulin sensitizer. (Ovasitol is a good brand) Because your diabetes medication has a potential dehydration side effect, be sure to drink plenty of water. Consider trying some of the arm chair exercises and including daily movement. Stress affects blood sugar. Look into alternatives to having to deal with the interstate traffic driving to [place]. Continue to get 8 hrs sleep at night. Aim to eat balanced meals and snacks Consider having less sweet tea, ginger ale, dessert Consider having the English muffin instead of the biscuit at [place]. When snacking on crackers, also include protein.   Expected Outcomes:  Demonstrated interest in learning. Expect positive outcomes  Education material provided: ADA Diabetes: Your Take Control Guide and Snack sheet, medication list  If problems or questions, patient to contact team via:  Phone and MyChart  Future DSME appointment: 4-6 wks

## 2018-08-18 NOTE — Patient Instructions (Addendum)
Be sure to pick up your Vitamin D prescription. Because you are not able to take metformin, look into inositol because it is an insulin sensitizer. (Ovasitol is a good brand) Because your diabetes medication has a potential dehydration side effect, be sure to drink plenty of water. Consider trying some of the arm chair exercises and including daily movement. Stress affects blood sugar. Look into alternatives to having to deal with the interstate traffic driving to [place]. Continue to get 8 hrs sleep at night. Aim to eat balanced meals and snacks Consider having less sweet tea, ginger ale, dessert Consider having the English muffin instead of the biscuit at [place]. When snacking on crackers, also include protein.

## 2018-08-19 ENCOUNTER — Encounter: Payer: Self-pay | Admitting: Registered"

## 2018-09-10 DIAGNOSIS — I208 Other forms of angina pectoris: Secondary | ICD-10-CM | POA: Diagnosis not present

## 2018-09-10 DIAGNOSIS — R0609 Other forms of dyspnea: Secondary | ICD-10-CM | POA: Diagnosis not present

## 2018-09-10 DIAGNOSIS — E119 Type 2 diabetes mellitus without complications: Secondary | ICD-10-CM | POA: Diagnosis not present

## 2018-09-10 DIAGNOSIS — E785 Hyperlipidemia, unspecified: Secondary | ICD-10-CM | POA: Diagnosis not present

## 2018-09-10 DIAGNOSIS — K219 Gastro-esophageal reflux disease without esophagitis: Secondary | ICD-10-CM | POA: Diagnosis not present

## 2018-09-10 DIAGNOSIS — I10 Essential (primary) hypertension: Secondary | ICD-10-CM | POA: Diagnosis not present

## 2018-09-19 ENCOUNTER — Encounter: Payer: Self-pay | Admitting: Nurse Practitioner

## 2018-09-19 ENCOUNTER — Ambulatory Visit (INDEPENDENT_AMBULATORY_CARE_PROVIDER_SITE_OTHER): Payer: PPO | Admitting: Nurse Practitioner

## 2018-09-19 VITALS — BP 182/106 | HR 78 | Temp 98.3°F | Ht 63.0 in | Wt 163.4 lb

## 2018-09-19 DIAGNOSIS — Z23 Encounter for immunization: Secondary | ICD-10-CM | POA: Diagnosis not present

## 2018-09-19 DIAGNOSIS — I119 Hypertensive heart disease without heart failure: Secondary | ICD-10-CM

## 2018-09-19 DIAGNOSIS — E119 Type 2 diabetes mellitus without complications: Secondary | ICD-10-CM

## 2018-09-19 DIAGNOSIS — G44229 Chronic tension-type headache, not intractable: Secondary | ICD-10-CM | POA: Diagnosis not present

## 2018-09-19 NOTE — Patient Instructions (Signed)
Low-Sodium Eating Plan Sodium, which is an element that makes up salt, helps you maintain a healthy balance of fluids in your body. Too much sodium can increase your blood pressure and cause fluid and waste to be held in your body. Your health care provider or dietitian may recommend following this plan if you have high blood pressure (hypertension), kidney disease, liver disease, or heart failure. Eating less sodium can help lower your blood pressure, reduce swelling, and protect your heart, liver, and kidneys. What are tips for following this plan? General guidelines  Most people on this plan should limit their sodium intake to 1,500-2,000 mg (milligrams) of sodium each day. Reading food labels  The Nutrition Facts label lists the amount of sodium in one serving of the food. If you eat more than one serving, you must multiply the listed amount of sodium by the number of servings.  Choose foods with less than 140 mg of sodium per serving.  Avoid foods with 300 mg of sodium or more per serving. Shopping  Look for lower-sodium products, often labeled as "low-sodium" or "no salt added."  Always check the sodium content even if foods are labeled as "unsalted" or "no salt added".  Buy fresh foods. ? Avoid canned foods and premade or frozen meals. ? Avoid canned, cured, or processed meats  Buy breads that have less than 80 mg of sodium per slice. Cooking  Eat more home-cooked food and less restaurant, buffet, and fast food.  Avoid adding salt when cooking. Use salt-free seasonings or herbs instead of table salt or sea salt. Check with your health care provider or pharmacist before using salt substitutes.  Cook with plant-based oils, such as canola, sunflower, or olive oil. Meal planning  When eating at a restaurant, ask that your food be prepared with less salt or no salt, if possible.  Avoid foods that contain MSG (monosodium glutamate). MSG is sometimes added to Chinese food,  bouillon, and some canned foods. What foods are recommended? The items listed may not be a complete list. Talk with your dietitian about what dietary choices are best for you. Grains Low-sodium cereals, including oats, puffed wheat and rice, and shredded wheat. Low-sodium crackers. Unsalted rice. Unsalted pasta. Low-sodium bread. Whole-grain breads and whole-grain pasta. Vegetables Fresh or frozen vegetables. "No salt added" canned vegetables. "No salt added" tomato sauce and paste. Low-sodium or reduced-sodium tomato and vegetable juice. Fruits Fresh, frozen, or canned fruit. Fruit juice. Meats and other protein foods Fresh or frozen (no salt added) meat, poultry, seafood, and fish. Low-sodium canned tuna and salmon. Unsalted nuts. Dried peas, beans, and lentils without added salt. Unsalted canned beans. Eggs. Unsalted nut butters. Dairy Milk. Soy milk. Cheese that is naturally low in sodium, such as ricotta cheese, fresh mozzarella, or Swiss cheese Low-sodium or reduced-sodium cheese. Cream cheese. Yogurt. Fats and oils Unsalted butter. Unsalted margarine with no trans fat. Vegetable oils such as canola or olive oils. Seasonings and other foods Fresh and dried herbs and spices. Salt-free seasonings. Low-sodium mustard and ketchup. Sodium-free salad dressing. Sodium-free light mayonnaise. Fresh or refrigerated horseradish. Lemon juice. Vinegar. Homemade, reduced-sodium, or low-sodium soups. Unsalted popcorn and pretzels. Low-salt or salt-free chips. What foods are not recommended? The items listed may not be a complete list. Talk with your dietitian about what dietary choices are best for you. Grains Instant hot cereals. Bread stuffing, pancake, and biscuit mixes. Croutons. Seasoned rice or pasta mixes. Noodle soup cups. Boxed or frozen macaroni and cheese. Regular salted crackers.   Self-rising flour. Vegetables Sauerkraut, pickled vegetables, and relishes. Olives. [place] fries. Onion rings.  Regular canned vegetables (not low-sodium or reduced-sodium). Regular canned tomato sauce and paste (not low-sodium or reduced-sodium). Regular tomato and vegetable juice (not low-sodium or reduced-sodium). Frozen vegetables in sauces. Meats and other protein foods Meat or fish that is salted, canned, smoked, spiced, or pickled. Bacon, ham, sausage, hotdogs, corned beef, chipped beef, packaged lunch meats, salt pork, jerky, pickled herring, anchovies, regular canned tuna, sardines, salted nuts. Dairy Processed cheese and cheese spreads. Cheese curds. Blue cheese. Feta cheese. String cheese. Regular cottage cheese. Buttermilk. Canned milk. Fats and oils Salted butter. Regular margarine. Ghee. Bacon fat. Seasonings and other foods Onion salt, garlic salt, seasoned salt, table salt, and sea salt. Canned and packaged gravies. Worcestershire sauce. Tartar sauce. Barbecue sauce. Teriyaki sauce. Soy sauce, including reduced-sodium. Steak sauce. Fish sauce. Oyster sauce. Cocktail sauce. Horseradish that you find on the shelf. Regular ketchup and mustard. Meat flavorings and tenderizers. Bouillon cubes. Hot sauce and Tabasco sauce. Premade or packaged marinades. Premade or packaged taco seasonings. Relishes. Regular salad dressings. Salsa. Potato and tortilla chips. Corn chips and puffs. Salted popcorn and pretzels. Canned or dried soups. Pizza. Frozen entrees and pot pies. Summary  Eating less sodium can help lower your blood pressure, reduce swelling, and protect your heart, liver, and kidneys.  Most people on this plan should limit their sodium intake to 1,500-2,000 mg (milligrams) of sodium each day.  Canned, boxed, and frozen foods are high in sodium. Restaurant foods, fast foods, and pizza are also very high in sodium. You also get sodium by adding salt to food.  Try to cook at home, eat more fresh fruits and vegetables, and eat less fast food, canned, processed, or prepared foods. This information is  not intended to replace advice given to you by your health care provider. Make sure you discuss any questions you have with your health care provider. Document Released: 05/18/2002 Document Revised: 11/19/2016 Document Reviewed: 11/19/2016 Elsevier Interactive Patient Education  2018 [employer].  Tension Headache A tension headache is pain, pressure, or aching that is felt over the front and sides of your head. These headaches can last from 30 minutes to several days. Follow these instructions at home: Managing pain  Take over-the-counter and prescription medicines only as told by your doctor.  Lie down in a dark, quiet room when you have a headache.  If directed, apply ice to your head and neck area: ? Put ice in a plastic bag. ? Place a towel between your skin and the bag. ? Leave the ice on for 20 minutes, 2-3 times per day.  Use a heating pad or a hot shower to apply heat to your head and neck area as told by your doctor. Eating and drinking  Eat meals on a regular schedule.  Do not drink a lot of alcohol.  Do not use a lot of caffeine, or stop using caffeine. General instructions  Keep all follow-up visits as told by your doctor. This is important.  Keep a journal to find out if certain things bring on headaches. For example, write down: ? What you eat and drink. ? How much sleep you get. ? Any change to your diet or medicines.  Try getting a massage, or doing other things that help you to relax.  Lessen stress.  Sit up straight. Do not tighten (tense) your muscles.  Do not use tobacco products. This includes cigarettes, chewing tobacco, or e-cigarettes. If you  need help quitting, ask your doctor.  Exercise regularly as told by your doctor.  Get enough sleep. This may mean 7-9 hours of sleep. Contact a doctor if:  Your symptoms are not helped by medicine.  You have a headache that feels different from your usual headache.  You feel sick to your stomach  (nauseous) or you throw up (vomit).  You have a fever. Get help right away if:  Your headache becomes very bad.  You keep throwing up.  You have a stiff neck.  You have trouble seeing.  You have trouble speaking.  You have pain in your eye or ear.  Your muscles are weak or you lose muscle control.  You lose your balance or you have trouble walking.  You feel like you will pass out (faint) or you pass out.  You have confusion. This information is not intended to replace advice given to you by your health care provider. Make sure you discuss any questions you have with your health care provider. Document Released: 02/20/2010 Document Revised: 07/26/2016 Document Reviewed: 03/21/2015 Elsevier Interactive Patient Education  [employer].

## 2018-09-19 NOTE — Progress Notes (Signed)
Subjective:     Patient ID: Bridget Good , female    DOB: Apr 30, 1951 , 67 y.o.   MRN: [id number]   Headache   This is a recurrent problem. The current episode started more than 1 month ago. The problem occurs intermittently. The problem has been gradually worsening. The pain quality is similar to prior headaches. The quality of the pain is described as aching. Pain scale: Not lasting as long. Pertinent negatives include no abnormal behavior or insomnia. She has tried nothing for the symptoms. Her past medical history is significant for hypertension. There is no history of cancer.  Hypertension  This is a chronic problem. The current episode started more than 1 year ago. The problem has been gradually worsening since onset. The problem is uncontrolled (Being followed by Dr. Terrence Dupont, he has started her on toprol and losartan/hctz which she has ). Associated symptoms include headaches.     Past Medical History:  Diagnosis Date  . Angina   . Bronchitis   . Diabetes mellitus   . Fibromyalgia   . GERD (gastroesophageal reflux disease)   . Headache(784.0)   . Hyperlipidemia   . Hypertension   . Hypertensive heart disease without CHF   . Lupus ([place])    "treated for it from 1992 til 2012; dr said I don't have it anymore"  . Obesity (BMI 30-39.9)   . Osteoarthritis   . Pneumonia   . Shortness of breath    lying down  . Shortness of breath on exertion       Current Outpatient Medications:  .  acetaminophen (TYLENOL) 500 MG tablet, Take 500 mg by mouth as needed for mild pain., Disp: , Rfl:  .  amLODipine (NORVASC) 10 MG tablet, Take 10 mg by mouth daily., Disp: , Rfl:  .  aspirin EC 81 MG tablet, Take 81 mg by mouth daily as needed for mild pain. , Disp: , Rfl:  .  clopidogrel (PLAVIX) 75 MG tablet, Take 1 tablet (75 mg total) by mouth daily., Disp: 30 tablet, Rfl: 11 .  empagliflozin (JARDIANCE) 10 MG TABS tablet, Take 10 mg by mouth daily., Disp: , Rfl:  .  KLOR-CON M20 20 MEQ  tablet, Take 20 mEq by mouth daily. , Disp: , Rfl: 0 .  nitroGLYCERIN (NITROSTAT) 0.4 MG SL tablet, Place 1 tablet (0.4 mg total) under the tongue every 5 (five) minutes as needed for chest pain., Disp: 25 tablet, Rfl: 12 .  pantoprazole (PROTONIX) 40 MG tablet, Take 1 tablet (40 mg total) by mouth daily at 12 noon., Disp: 30 tablet, Rfl: 12 .  rosuvastatin (CRESTOR) 20 MG tablet, Take 1 tablet (20 mg total) by mouth daily., Disp: 30 tablet, Rfl: 3 .  valsartan-hydrochlorothiazide (DIOVAN-HCT) 320-25 MG tablet, Take 1 tablet by mouth daily., Disp: 30 tablet, Rfl: 0 .  Vitamin D, Ergocalciferol, (DRISDOL) 50000 units CAPS capsule, Take 50,000 Units by mouth every 7 (seven) days., Disp: , Rfl:    Allergies  Allergen Reactions  . Sulfa Antibiotics Hives, Itching and Nausea And Vomiting     Review of Systems  Constitutional: Negative.   Respiratory: Negative.   Cardiovascular: Negative.   Gastrointestinal: Negative.   Musculoskeletal: Negative.   Neurological: Positive for headaches.  Psychiatric/Behavioral: The patient does not have insomnia.        Depressed     Today's Vitals   09/19/18 1225  BP: (!) 182/106  Pulse: 78  Temp: 98.3 F (36.8 C)  SpO2: 98%  Weight: 163  lb 6.4 oz (74.1 kg)  Height: 5\' 3"  (1.6 m)  PainSc: 4   PainLoc: Knee   Body mass index is 28.95 kg/m.   Objective:  Physical Exam  Constitutional: She is oriented to person, place, and time. She appears well-developed and well-nourished.  Cardiovascular: Normal rate, regular rhythm and normal heart sounds.  Pulmonary/Chest: Effort normal and breath sounds normal.  Neurological: She is alert and oriented to person, place, and time. She has normal strength. She is not disoriented. No sensory deficit.  Skin: Skin is warm and dry.        Assessment And Plan:     1. Hypertensive heart disease without CHF  Chronic, poorly controlled.  I have explained to her she needs to take her blood pressure medications  regularly.    Continue with current medications  She is to check her blood pressure at home and call with her readings.  - CMP14 + Anion Gap  2. Diabetes mellitus type 2, noninsulin dependent (HCC)  Chronic, controlled  Continue with current medications  Will check HgbA1c.  - CMP14 + Anion Gap - Lipid Profile - Hemoglobin A1c  3. Chronic tension-type headache, not intractable  Slightly improved since her last visit.  She is also being seen by Neurology.   She is also advised to follow up with Hematology due to her abnormal CT scan  Likely related to her elevated blood pressure.  4. Need for influenza vaccination  Influenza vaccine administered  Encouraged to take Tylenol as needed for fever or muscle aches. - Flu vaccine HIGH DOSE PF (Fluzone High dose)       [name], FNP

## 2018-09-20 LAB — CMP14 + ANION GAP
ALT: 10 IU/L (ref 0–32)
AST: 15 IU/L (ref 0–40)
Albumin/Globulin Ratio: 1.7 (ref 1.2–2.2)
Albumin: 4.2 g/dL (ref 3.6–4.8)
Alkaline Phosphatase: 92 IU/L (ref 39–117)
Anion Gap: 15 mmol/L (ref 10.0–18.0)
BUN/Creatinine Ratio: 9 — ABNORMAL LOW (ref 12–28)
BUN: 11 mg/dL (ref 8–27)
Bilirubin Total: 0.9 mg/dL (ref 0.0–1.2)
CO2: 26 mmol/L (ref 20–29)
Calcium: 9.3 mg/dL (ref 8.7–10.3)
Chloride: 100 mmol/L (ref 96–106)
Creatinine, Ser: 1.19 mg/dL — ABNORMAL HIGH (ref 0.57–1.00)
GFR calc Af Amer: 55 mL/min/{1.73_m2} — ABNORMAL LOW (ref >59)
GFR calc non Af Amer: 48 mL/min/{1.73_m2} — ABNORMAL LOW (ref >59)
Globulin, Total: 2.5 g/dL (ref 1.5–4.5)
Glucose: 111 mg/dL — ABNORMAL HIGH (ref 65–99)
Potassium: 3.3 mmol/L — ABNORMAL LOW (ref 3.5–5.2)
Sodium: 141 mmol/L (ref 134–144)
Total Protein: 6.7 g/dL (ref 6.0–8.5)

## 2018-09-20 LAB — LIPID PANEL
Chol/HDL Ratio: 2.7 ratio (ref 0.0–4.4)
Cholesterol, Total: 183 mg/dL (ref 100–199)
HDL: 67 mg/dL (ref >39)
LDL Calculated: 97 mg/dL (ref 0–99)
Triglycerides: 93 mg/dL (ref 0–149)
VLDL Cholesterol Cal: 19 mg/dL (ref 5–40)

## 2018-09-20 LAB — HEMOGLOBIN A1C
Est. average glucose Bld gHb Est-mCnc: 148 mg/dL
Hgb A1c MFr Bld: 6.8 % — ABNORMAL HIGH (ref 4.8–5.6)

## 2018-09-22 ENCOUNTER — Ambulatory Visit: Payer: PPO | Admitting: Registered"

## 2018-10-16 ENCOUNTER — Ambulatory Visit: Payer: PPO | Admitting: Cardiovascular Disease

## 2018-10-27 ENCOUNTER — Encounter: Payer: Self-pay | Admitting: Cardiovascular Disease

## 2018-10-28 ENCOUNTER — Encounter: Payer: Self-pay | Admitting: Cardiovascular Disease

## 2018-10-30 ENCOUNTER — Ambulatory Visit: Payer: PPO | Admitting: Nurse Practitioner

## 2018-10-30 ENCOUNTER — Ambulatory Visit: Payer: PPO | Admitting: Internal Medicine

## 2018-11-03 ENCOUNTER — Ambulatory Visit: Payer: PPO | Admitting: Nurse Practitioner

## 2018-11-14 ENCOUNTER — Ambulatory Visit (INDEPENDENT_AMBULATORY_CARE_PROVIDER_SITE_OTHER): Payer: PPO | Admitting: Nurse Practitioner

## 2018-11-14 VITALS — BP 180/110 | HR 74 | Temp 98.9°F | Ht 62.25 in | Wt 166.8 lb

## 2018-11-14 DIAGNOSIS — E119 Type 2 diabetes mellitus without complications: Secondary | ICD-10-CM

## 2018-11-14 DIAGNOSIS — F32A Depression, unspecified: Secondary | ICD-10-CM

## 2018-11-14 DIAGNOSIS — F329 Major depressive disorder, single episode, unspecified: Secondary | ICD-10-CM

## 2018-11-14 DIAGNOSIS — Z794 Long term (current) use of insulin: Secondary | ICD-10-CM | POA: Diagnosis not present

## 2018-11-14 DIAGNOSIS — F419 Anxiety disorder, unspecified: Secondary | ICD-10-CM

## 2018-11-14 DIAGNOSIS — I10 Essential (primary) hypertension: Secondary | ICD-10-CM

## 2018-11-14 NOTE — Progress Notes (Signed)
Subjective:     Patient ID: Bridget Good , female    DOB: 10-01-1951 , 67 y.o.   MRN: [id number]   Chief Complaint  Patient presents with  . Medicare Wellness    HPI   Here today for her Medicare Annual Wellness Hypertension  This is a chronic problem. The current episode started more than 1 year ago. The problem has been gradually worsening since onset. The problem is uncontrolled. Associated symptoms include headaches and malaise/fatigue. Pertinent negatives include no palpitations. There are no associated agents to hypertension. Risk factors for coronary artery disease include diabetes mellitus and sedentary lifestyle. Past treatments include beta blockers (she is confused about what medications she is taking regularly). The current treatment provides no improvement. Identifiable causes of hypertension include chronic renal disease.     Past Medical History:  Diagnosis Date  . Angina   . Bronchitis   . Diabetes mellitus   . Fibromyalgia   . GERD (gastroesophageal reflux disease)   . Headache(784.0)   . Hyperlipidemia   . Hypertension   . Hypertensive heart disease without CHF   . Lupus ([place])    "treated for it from 1992 til 2012; dr said I don't have it anymore"  . Obesity (BMI 30-39.9)   . Osteoarthritis   . Pneumonia   . Shortness of breath    lying down  . Shortness of breath on exertion      Family History  Problem Relation Age of Onset  . Heart attack Father   . Diabetes Father   . Hypertension Mother   . Heart attack Brother   . Kidney failure Brother   . Diabetes Sister   . Diabetes Sister      Current Outpatient Medications:  .  amLODipine (NORVASC) 10 MG tablet, Take 10 mg by mouth daily., Disp: , Rfl:  .  aspirin EC 81 MG tablet, Take 81 mg by mouth daily as needed for mild pain. , Disp: , Rfl:  .  clopidogrel (PLAVIX) 75 MG tablet, Take 1 tablet (75 mg total) by mouth daily., Disp: 30 tablet, Rfl: 11 .  empagliflozin (JARDIANCE) 10 MG TABS  tablet, Take 10 mg by mouth daily., Disp: , Rfl:  .  KLOR-CON M20 20 MEQ tablet, Take 20 mEq by mouth daily. , Disp: , Rfl: 0 .  nitroGLYCERIN (NITROSTAT) 0.4 MG SL tablet, Place 1 tablet (0.4 mg total) under the tongue every 5 (five) minutes as needed for chest pain., Disp: 25 tablet, Rfl: 12 .  pantoprazole (PROTONIX) 40 MG tablet, Take 1 tablet (40 mg total) by mouth daily at 12 noon., Disp: 30 tablet, Rfl: 12 .  rosuvastatin (CRESTOR) 20 MG tablet, Take 1 tablet (20 mg total) by mouth daily., Disp: 30 tablet, Rfl: 3 .  valsartan-hydrochlorothiazide (DIOVAN-HCT) 320-25 MG tablet, Take 1 tablet by mouth daily., Disp: 30 tablet, Rfl: 0 .  Vitamin D, Ergocalciferol, (DRISDOL) 50000 units CAPS capsule, Take 50,000 Units by mouth every 7 (seven) days., Disp: , Rfl:  .  acetaminophen (TYLENOL) 500 MG tablet, Take 500 mg by mouth as needed for mild pain., Disp: , Rfl:    Allergies  Allergen Reactions  . Sulfa Antibiotics Hives, Itching and Nausea And Vomiting     Review of Systems  Constitutional: Positive for malaise/fatigue. Negative for fatigue.  HENT: Negative.   Eyes: Negative.   Respiratory: Negative.   Cardiovascular: Negative.  Negative for palpitations.  Gastrointestinal: Negative.   Endocrine: Negative.  Negative for polydipsia, polyphagia and  polyuria.  Genitourinary: Negative.   Musculoskeletal: Negative.   Skin: Negative.   Allergic/Immunologic: Negative.   Neurological: Positive for headaches. Negative for dizziness and weakness.  Hematological: Negative.   Psychiatric/Behavioral: Negative.      There were no vitals filed for this visit. There is no height or weight on file to calculate BMI.   Objective:  Physical Exam Constitutional:      General: Vital signs are normal.     Appearance: She is well-developed and well-nourished.  HENT:     Head: Normocephalic and atraumatic.     Right Ear: Hearing, tympanic membrane, ear canal and external ear normal.     Left Ear:  Hearing, tympanic membrane, ear canal and external ear normal.     Nose: Nose normal.     Mouth/Throat:     Mouth: Oropharynx is clear and moist.  Eyes:     General: Lids are normal.     Extraocular Movements: EOM normal.     Conjunctiva/sclera: Conjunctivae normal.     Pupils: Pupils are equal, round, and reactive to light.     Funduscopic exam:    Right eye: No papilledema.        Left eye: No papilledema.  Neck:     Musculoskeletal: Full passive range of motion without pain, normal range of motion and neck supple.     Thyroid: No thyroid mass.     Vascular: No carotid bruit.  Cardiovascular:     Rate and Rhythm: Normal rate and regular rhythm.     Pulses: Normal pulses and intact distal pulses.     Heart sounds: Normal heart sounds. No murmur.  Pulmonary:     Effort: Pulmonary effort is normal.     Breath sounds: Normal breath sounds.  Abdominal:     General: Bowel sounds are normal.     Palpations: Abdomen is soft.  Musculoskeletal: Normal range of motion.  Skin:    General: Skin is warm and dry.     Capillary Refill: Capillary refill takes less than 2 seconds.  Neurological:     Mental Status: She is alert and oriented to person, place, and time.     Cranial Nerves: No cranial nerve deficit.     Sensory: No sensory deficit.     Deep Tendon Reflexes: Strength normal.  Psychiatric:        Mood and Affect: Mood and affect normal.        Speech: Speech normal.        Behavior: Behavior is slowed.        Thought Content: Thought content normal.        Cognition and Memory: Memory is impaired.     Comments: Difficult for her to make clear decisions         Assessment And Plan:     1. Essential hypertension . B/P is poorly controlled.  . CMP ordered to check renal function.   Encouraged to make sure she is taking her medications regularly   I did not do a full medicare annual wellness due to the multiple questions the daughter has in reference to her mother's  care - POCT Urinalysis Dipstick (81002) - POCT UA - Microalbumin - EKG 12-Lead - CMP14 + Anion Gap  2. Type 2 diabetes mellitus without complication, with long-term current use of insulin (HCC)  Chronic, controlled  Continue with current medications  Encouraged to limit intake of sugary foods and drinks  Encouraged to increase physical activity to 150 minutes  per week - CBC no Diff - CMP14 + Anion Gap - Hemoglobin A1c  3. Anxiety Chronic, controlled Continue with current medications Daughter is here with her today and verbalizes she is anxious and has difficulty with remembering.   She is currently on medications for depression and anxiety, I am not going to make any changes at this time attempting to focus on her blood pressure She had an abnormal MRI of the brain and did not follow up with Oncology as she was advised she thinks I will follow up on this  4. Depression, unspecified depression type  Continues she is smiling more, continue with current medications.  I will refer to psychiatry again since she did not go previously either because she forgot or due to the multiple problems she has been having.          [name], FNP

## 2018-11-15 LAB — CMP14 + ANION GAP
ALT: 13 IU/L (ref 0–32)
AST: 21 IU/L (ref 0–40)
Albumin/Globulin Ratio: 1.6 (ref 1.2–2.2)
Albumin: 4.3 g/dL (ref 3.6–4.8)
Alkaline Phosphatase: 111 IU/L (ref 39–117)
Anion Gap: 15 mmol/L (ref 10.0–18.0)
BUN/Creatinine Ratio: 15 (ref 12–28)
BUN: 13 mg/dL (ref 8–27)
Bilirubin Total: 0.8 mg/dL (ref 0.0–1.2)
CO2: 25 mmol/L (ref 20–29)
Calcium: 9.5 mg/dL (ref 8.7–10.3)
Chloride: 102 mmol/L (ref 96–106)
Creatinine, Ser: 0.88 mg/dL (ref 0.57–1.00)
GFR calc Af Amer: 80 mL/min/{1.73_m2} (ref >59)
GFR calc non Af Amer: 69 mL/min/{1.73_m2} (ref >59)
Globulin, Total: 2.7 g/dL (ref 1.5–4.5)
Glucose: 107 mg/dL — ABNORMAL HIGH (ref 65–99)
Potassium: 3.6 mmol/L (ref 3.5–5.2)
Sodium: 142 mmol/L (ref 134–144)
Total Protein: 7 g/dL (ref 6.0–8.5)

## 2018-11-15 LAB — CBC
Hematocrit: 44.3 % (ref 34.0–46.6)
Hemoglobin: 14.7 g/dL (ref 11.1–15.9)
MCH: 29.2 pg (ref 26.6–33.0)
MCHC: 33.2 g/dL (ref 31.5–35.7)
MCV: 88 fL (ref 79–97)
Platelets: 325 10*3/uL (ref 150–450)
RBC: 5.03 x10E6/uL (ref 3.77–5.28)
RDW: 13.5 % (ref 12.3–15.4)
WBC: 4.4 10*3/uL (ref 3.4–10.8)

## 2018-11-15 LAB — HEMOGLOBIN A1C
Est. average glucose Bld gHb Est-mCnc: 166 mg/dL
Hgb A1c MFr Bld: 7.4 % — ABNORMAL HIGH (ref 4.8–5.6)

## 2018-11-26 ENCOUNTER — Ambulatory Visit (INDEPENDENT_AMBULATORY_CARE_PROVIDER_SITE_OTHER): Payer: PPO | Admitting: Nurse Practitioner

## 2018-11-26 ENCOUNTER — Encounter: Payer: Self-pay | Admitting: Nurse Practitioner

## 2018-11-26 VITALS — BP 172/100 | HR 85 | Temp 98.5°F | Ht 62.25 in | Wt 167.4 lb

## 2018-11-26 DIAGNOSIS — R799 Abnormal finding of blood chemistry, unspecified: Secondary | ICD-10-CM | POA: Diagnosis not present

## 2018-11-26 DIAGNOSIS — Z741 Need for assistance with personal care: Secondary | ICD-10-CM | POA: Diagnosis not present

## 2018-11-26 DIAGNOSIS — E2839 Other primary ovarian failure: Secondary | ICD-10-CM

## 2018-11-26 DIAGNOSIS — F32A Depression, unspecified: Secondary | ICD-10-CM

## 2018-11-26 DIAGNOSIS — Z0000 Encounter for general adult medical examination without abnormal findings: Secondary | ICD-10-CM

## 2018-11-26 DIAGNOSIS — Z23 Encounter for immunization: Secondary | ICD-10-CM | POA: Diagnosis not present

## 2018-11-26 DIAGNOSIS — F329 Major depressive disorder, single episode, unspecified: Secondary | ICD-10-CM

## 2018-11-26 DIAGNOSIS — I119 Hypertensive heart disease without heart failure: Secondary | ICD-10-CM

## 2018-11-26 DIAGNOSIS — E119 Type 2 diabetes mellitus without complications: Secondary | ICD-10-CM

## 2018-11-26 MED ORDER — VALSARTAN-HYDROCHLOROTHIAZIDE 160-12.5 MG PO TABS
1.0000 | ORAL_TABLET | Freq: Every day | ORAL | 2 refills | Status: DC
Start: 2018-11-26 — End: 2019-03-02

## 2018-11-26 NOTE — Patient Instructions (Addendum)
TAKE VALSARTAN/HCTZ TODAY WHEN YOU PICK IT UP THEN TOMORROW TAKE MID MORNING, THEN ON Friday TAKE IN THE MORNING.    CHANGE THE AMLODIPINE TO THE EVENING TOMORROW  CHECK BLOOD PRESSURE ONCE DAILY   BONE DENSITY ORDERED WILL CALL AND HOME HEALTH WILL CALL, IN ADDITION TO PODIATRY.  MAKE SURE TO GO TO APPT TOMORROW 11/27/18 AT 9:30AM WITH DR HIGGS (HEMATOLOGY) AT [facility].  DO NOT MISS THIS APPT  COME BACK ON Friday FOR BLOOD PRESSURE CHECK NURSE VISIT IF NURSE DOES NOT COME  Advance Directive  Advance directives are legal documents that let you make choices ahead of time about your health care and medical treatment in case you become unable to communicate for yourself. Advance directives are a way for you to communicate your wishes to family, friends, and health care providers. This can help convey your decisions about end-of-life care if you become unable to communicate. Discussing and writing advance directives should happen over time rather than all at once. Advance directives can be changed depending on your situation and what you want, even after you have signed the advance directives. If you do not have an advance directive, some states assign family decision makers to act on your behalf based on how closely you are related to them. Each state has its own laws regarding advance directives. You may want to check with your health care provider, attorney, or state representative about the laws in your state. There are different types of advance directives, such as:  Medical power of attorney.  Living will.  Do not resuscitate (DNR) or do not attempt resuscitation (DNAR) order. Health care proxy and medical power of attorney A health care proxy, also called a health care agent, is a person who is appointed to make medical decisions for you in cases in which you are unable to make the decisions yourself. Generally, people choose someone they know well and trust to  represent their preferences. Make sure to ask this person for an agreement to act as your proxy. A proxy may have to exercise judgment in the event of a medical decision for which your wishes are not known. A medical power of attorney is a legal document that names your health care proxy. Depending on the laws in your state, after the document is written, it may also need to be:  Signed.  Notarized.  Dated.  Copied.  Witnessed.  Incorporated into your medical record. You may also want to appoint someone to manage your financial affairs in a situation in which you are unable to do so. This is called a durable power of attorney for finances. It is a separate legal document from the durable power of attorney for health care. You may choose the same person or someone different from your health care proxy to act as your agent in financial matters. If you do not appoint a proxy, or if there is a concern that the proxy is not acting in your best interests, a court-appointed guardian may be designated to act on your behalf. Living will A living will is a set of instructions documenting your wishes about medical care when you cannot express them yourself. Health care providers should keep a copy of your living will in your medical record. You may want to give a copy to family members or friends. To alert caregivers in case of an emergency, you can place a card in your wallet to let them know that you have a  living will and where they can find it. A living will is used if you become:  Terminally ill.  Incapacitated.  Unable to communicate or make decisions. Items to consider in your living will include:  The use or non-use of life-sustaining equipment, such as dialysis machines and breathing machines (ventilators).  A DNR or DNAR order, which is the instruction not to use cardiopulmonary resuscitation (CPR) if breathing or heartbeat stops.  The use or non-use of tube feeding.  Withholding of  food and fluids.  Comfort (palliative) care when the goal becomes comfort rather than a cure.  Organ and tissue donation. A living will does not give instructions for distributing your money and property if you should pass away. It is recommended that you seek the advice of a lawyer when writing a will. Decisions about taxes, beneficiaries, and asset distribution will be legally binding. This process can relieve your family and friends of any concerns surrounding disputes or questions that may come up about the distribution of your assets. DNR or DNAR A DNR or DNAR order is a request not to have CPR in the event that your heart stops beating or you stop breathing. If a DNR or DNAR order has not been made and shared, a health care provider will try to help any patient whose heart has stopped or who has stopped breathing. If you plan to have surgery, talk with your health care provider about how your DNR or DNAR order will be followed if problems occur. Summary  Advance directives are the legal documents that allow you to make choices ahead of time about your health care and medical treatment in case you become unable to communicate for yourself.  The process of discussing and writing advance directives should happen over time. You can change the advance directives, even after you have signed them.  Advance directives include DNR or DNAR orders, living wills, and designating an agent as your medical power of attorney. This information is not intended to replace advice given to you by your health care provider. Make sure you discuss any questions you have with your health care provider. Document Released: 03/04/2008 Document Revised: 10/15/2016 Document Reviewed: 10/15/2016 Elsevier Interactive Patient Education  2019 [employer].    Bridget Good , Thank you for taking time to come for your Medicare Wellness Visit. I appreciate your ongoing commitment to your health goals. Please review the  following plan we discussed and let me know if I can assist you in the future.   These are the goals we discussed: Goals    . HEMOGLOBIN A1C < 7.0     I would like to get my HgbA1c below 7       This is a list of the screening recommended for you and due dates:  Health Maintenance  Topic Date Due  .  Hepatitis C: One time screening is recommended by Center for Disease Control  (CDC) for  adults born from [birth year] through 1963.   1951/11/28  . Complete foot exam   01/24/1963  . Eye exam for diabetics  01/24/1963  . Tetanus Vaccine  01/25/1972  . Pap Smear  01/24/1974  . Mammogram  01/24/2003  . DEXA scan (bone density measurement)  01/24/2018  . Pneumonia vaccines (1 of 2 - PCV13) 01/24/2018  . Hemoglobin A1C  05/16/2019  . Colon Cancer Screening  03/29/2025  . Flu Shot  Completed  . HIV Screening  Completed     Pneumococcal Conjugate Vaccine suspension for injection  What is this medicine? PNEUMOCOCCAL VACCINE (NEU mo KOK al vak SEEN) is a vaccine used to prevent pneumococcus bacterial infections. These bacteria can cause serious infections like pneumonia, meningitis, and blood infections. This vaccine will lower your chance of getting pneumonia. If you do get pneumonia, it can make your symptoms milder and your illness shorter. This vaccine will not treat an infection and will not cause infection. This vaccine is recommended for infants and young children, adults with certain medical conditions, and adults 73 years or older. This medicine may be used for other purposes; ask your health care provider or pharmacist if you have questions. COMMON BRAND NAME(S): Prevnar, Prevnar 13 What should I tell my health care provider before I take this medicine? They need to know if you have any of these conditions: -bleeding problems -fever -immune system problems -an unusual or allergic reaction to pneumococcal vaccine, diphtheria toxoid, other vaccines, latex, other medicines, foods, dyes, or  preservatives -pregnant or trying to get pregnant -breast-feeding How should I use this medicine? This vaccine is for injection into a muscle. It is given by a health care professional. A copy of Vaccine Information Statements will be given before each vaccination. Read this sheet carefully each time. The sheet may change frequently. Talk to your pediatrician regarding the use of this medicine in children. While this drug may be prescribed for children as young as 38 weeks old for selected conditions, precautions do apply. Overdosage: If you think you have taken too much of this medicine contact a poison control center or emergency room at once. NOTE: This medicine is only for you. Do not share this medicine with others. What if I miss a dose? It is important not to miss your dose. Call your doctor or health care professional if you are unable to keep an appointment. What may interact with this medicine? -medicines for cancer chemotherapy -medicines that suppress your immune function -steroid medicines like prednisone or cortisone This list may not describe all possible interactions. Give your health care provider a list of all the medicines, herbs, non-prescription drugs, or dietary supplements you use. Also tell them if you smoke, drink alcohol, or use illegal drugs. Some items may interact with your medicine. What should I watch for while using this medicine? Mild fever and pain should go away in 3 days or less. Report any unusual symptoms to your doctor or health care professional. What side effects may I notice from receiving this medicine? Side effects that you should report to your doctor or health care professional as soon as possible: -allergic reactions like skin rash, itching or hives, swelling of the face, lips, or tongue -breathing problems -confused -fast or irregular heartbeat -fever over 102 degrees F -seizures -unusual bleeding or bruising -unusual muscle weakness Side  effects that usually do not require medical attention (report to your doctor or health care professional if they continue or are bothersome): -aches and pains -diarrhea -fever of 102 degrees F or less -headache -irritable -loss of appetite -pain, tender at site where injected -trouble sleeping This list may not describe all possible side effects. Call your doctor for medical advice about side effects. You may report side effects to FDA at 1-800-FDA-1088. Where should I keep my medicine? This does not apply. This vaccine is given in a clinic, pharmacy, doctor's office, or other health care setting and will not be stored at home. NOTE: This sheet is a summary. It may not cover all possible information. If you have questions about  this medicine, talk to your doctor, pharmacist, or health care provider.  2019 Elsevier/Gold Standard (2014-09-02 10:27:27)

## 2018-11-26 NOTE — Progress Notes (Addendum)
Subjective:     Patient ID: Bridget Good , female    DOB: 1951-12-04 , 67 y.o.   MRN: [id number]   Chief Complaint  Patient presents with  . Medicare Wellness  . Hypertension    HPI  Hypertension  This is a chronic problem. The current episode started more than 1 year ago. The problem has been gradually worsening since onset. The problem is uncontrolled. Associated symptoms include headaches. Pertinent negatives include no anxiety, chest pain, malaise/fatigue, palpitations or peripheral edema. There are no associated agents to hypertension. Risk factors for coronary artery disease include sedentary lifestyle and obesity. Past treatments include calcium channel blockers and angiotensin blockers. The current treatment provides no improvement. There are no compliance problems.  There is no history of kidney disease. There is no history of chronic renal disease.     Past Medical History:  Diagnosis Date  . Angina   . Bronchitis   . Diabetes mellitus   . Fibromyalgia   . GERD (gastroesophageal reflux disease)   . Headache(784.0)   . Hyperlipidemia   . Hypertension   . Hypertensive heart disease without CHF   . Lupus ([place])    "treated for it from 1992 til 2012; dr said I don't have it anymore"  . Obesity (BMI 30-39.9)   . Osteoarthritis   . Pneumonia   . Shortness of breath    lying down  . Shortness of breath on exertion      Family History  Problem Relation Age of Onset  . Heart attack Father   . Diabetes Father   . Hypertension Mother   . Heart attack Brother   . Kidney failure Brother   . Diabetes Sister   . Diabetes Sister      Current Outpatient Medications:  .  acetaminophen (TYLENOL) 500 MG tablet, Take 500 mg by mouth as needed for mild pain., Disp: , Rfl:  .  amLODipine (NORVASC) 10 MG tablet, Take 10 mg by mouth daily., Disp: , Rfl:  .  aspirin EC 81 MG tablet, Take 81 mg by mouth daily as needed for mild pain. , Disp: , Rfl:  .  clopidogrel (PLAVIX) 75  MG tablet, Take 1 tablet (75 mg total) by mouth daily., Disp: 30 tablet, Rfl: 11 .  empagliflozin (JARDIANCE) 10 MG TABS tablet, Take 10 mg by mouth daily., Disp: , Rfl:  .  KLOR-CON M20 20 MEQ tablet, Take 20 mEq by mouth daily. , Disp: , Rfl: 0 .  nitroGLYCERIN (NITROSTAT) 0.4 MG SL tablet, Place 1 tablet (0.4 mg total) under the tongue every 5 (five) minutes as needed for chest pain., Disp: 25 tablet, Rfl: 12 .  pantoprazole (PROTONIX) 40 MG tablet, Take 1 tablet (40 mg total) by mouth daily at 12 noon., Disp: 30 tablet, Rfl: 12 .  rosuvastatin (CRESTOR) 20 MG tablet, Take 1 tablet (20 mg total) by mouth daily., Disp: 30 tablet, Rfl: 3 .  valsartan-hydrochlorothiazide (DIOVAN-HCT) 320-25 MG tablet, Take 1 tablet by mouth daily., Disp: 30 tablet, Rfl: 0 .  Vitamin D, Ergocalciferol, (DRISDOL) 50000 units CAPS capsule, Take 50,000 Units by mouth every 7 (seven) days., Disp: , Rfl:    Allergies  Allergen Reactions  . Sulfa Antibiotics Hives, Itching and Nausea And Vomiting     Review of Systems  Constitutional: Negative.  Negative for malaise/fatigue.  HENT: Negative.   Eyes: Negative.   Respiratory: Negative.   Cardiovascular: Negative.  Negative for chest pain and palpitations.  Gastrointestinal: Negative.  Endocrine: Negative.   Genitourinary: Negative.   Musculoskeletal: Negative.   Skin: Negative.   Allergic/Immunologic: Negative.   Neurological: Positive for headaches. Negative for dizziness.  Hematological: Negative.   Psychiatric/Behavioral: Positive for confusion (memory problems).     Today's Vitals   11/26/18 1430  BP: (!) 172/100  Pulse: 85  Temp: 98.5 F (36.9 C)  TempSrc: Oral  SpO2: 95%  Weight: 167 lb 6.4 oz (75.9 kg)  Height: 5' 2.25" (1.581 m)  PainSc: 0-No pain   Body mass index is 30.37 kg/m.   Objective:  Physical Exam Constitutional:      Appearance: Normal appearance. She is well-developed.  HENT:     Head: Normocephalic and atraumatic.      Right Ear: Hearing, tympanic membrane, ear canal and external ear normal.     Left Ear: Hearing, tympanic membrane, ear canal and external ear normal.     Nose: Nose normal.     Mouth/Throat:     Mouth: Mucous membranes are moist.  Eyes:     General: Lids are normal.     Conjunctiva/sclera: Conjunctivae normal.     Pupils: Pupils are equal, round, and reactive to light.     Funduscopic exam:    Right eye: No papilledema.        Left eye: No papilledema.  Neck:     Musculoskeletal: Full passive range of motion without pain, normal range of motion and neck supple.     Thyroid: No thyroid mass.     Vascular: No carotid bruit.  Cardiovascular:     Rate and Rhythm: Normal rate and regular rhythm.     Pulses: Normal pulses.     Heart sounds: Normal heart sounds. No murmur.  Pulmonary:     Effort: Pulmonary effort is normal.     Breath sounds: Normal breath sounds.  Abdominal:     General: Abdomen is flat. Bowel sounds are normal.     Palpations: Abdomen is soft.  Musculoskeletal: Normal range of motion.  Skin:    General: Skin is warm and dry.     Capillary Refill: Capillary refill takes less than 2 seconds.  Neurological:     General: No focal deficit present.     Mental Status: She is alert and oriented to person, place, and time.     Cranial Nerves: No cranial nerve deficit.     Sensory: No sensory deficit.  Psychiatric:        Mood and Affect: Mood normal.        Behavior: Behavior normal.        Thought Content: Thought content normal.        Judgment: Judgment normal.          Assessment And Plan:      1. Encounter for Medicare annual wellness exam . Behavior modifications discussed and diet history reviewed.   . Pt will continue to exercise regularly and modify diet with low GI, plant based foods and decrease intake of processed foods.  . Recommend intake of daily multivitamin, Vitamin D, and calcium.  . Recommend mammogram and colonoscopy for preventive  screenings, as well as recommend immunizations that include influenza, TDAP (will send to pharmacy) - Pneumococcal 23  2. Hypertensive heart disease without CHF . B/P is poorly controlled . The importance of regular exercise and dietary modification was stressed to the patient.  . Stressed importance of losing ten percent of her body weight to help with B/P control. The weight loss would help  with decreasing cardiac and cancer risk as well.  . She has not been taking her valsartan/hctz she is to restart at 160/12.5 tonight then take again in am to get her on schedule.  She is to take the amlodipine in the evening.  3. Need for assistance with personal care  Will refer to home health for medication and ADL's management  4. Decreased estrogen level -Bone density ordered  5. Abnormal blood chemistry  She had abnormal Kappa and Lamda, scheduled her an appt with Hematology Dr. Burnett Kanaris tomorrow at 950 am I have stressed the importance of making this appt  Also will follow up on her rheumatology.    Her daughter was present during most of the visit and her husband as well earlier in the visit.     [name], FNP    Subjective:    Bridget Good is a 67 y.o. female who presents for a Welcome to Medicare exam.   Review of Systems See above        Objective:    Today's Vitals   11/26/18 1430  BP: (!) 172/100  Pulse: 85  Temp: 98.5 F (36.9 C)  TempSrc: Oral  SpO2: 95%  Weight: 167 lb 6.4 oz (75.9 kg)  Height: 5' 2.25" (1.581 m)  PainSc: 0-No pain  Body mass index is 30.37 kg/m.  Medications Outpatient Encounter Medications as of 11/26/2018  Medication Sig  . acetaminophen (TYLENOL) 500 MG tablet Take 500 mg by mouth as needed for mild pain.  Marland Kitchen amLODipine (NORVASC) 10 MG tablet Take 10 mg by mouth daily.  Marland Kitchen aspirin EC 81 MG tablet Take 81 mg by mouth daily as needed for mild pain.   Marland Kitchen clopidogrel (PLAVIX) 75 MG tablet Take 1 tablet (75 mg total) by mouth daily.  .  empagliflozin (JARDIANCE) 10 MG TABS tablet Take 10 mg by mouth daily.  Marland Kitchen KLOR-CON M20 20 MEQ tablet Take 20 mEq by mouth daily.   . nitroGLYCERIN (NITROSTAT) 0.4 MG SL tablet Place 1 tablet (0.4 mg total) under the tongue every 5 (five) minutes as needed for chest pain.  . pantoprazole (PROTONIX) 40 MG tablet Take 1 tablet (40 mg total) by mouth daily at 12 noon.  . rosuvastatin (CRESTOR) 20 MG tablet Take 1 tablet (20 mg total) by mouth daily.  . Vitamin D, Ergocalciferol, (DRISDOL) 50000 units CAPS capsule Take 50,000 Units by mouth every 7 (seven) days.  . [DISCONTINUED] valsartan-hydrochlorothiazide (DIOVAN-HCT) 320-25 MG tablet Take 1 tablet by mouth daily.   No facility-administered encounter medications on file as of 11/26/2018.      History: Past Medical History:  Diagnosis Date  . Angina   . Bronchitis   . Diabetes mellitus   . Fibromyalgia   . GERD (gastroesophageal reflux disease)   . Headache(784.0)   . Hyperlipidemia   . Hypertension   . Hypertensive heart disease without CHF   . Lupus ([place])    "treated for it from 1992 til 2012; dr said I don't have it anymore"  . Obesity (BMI 30-39.9)   . Osteoarthritis   . Pneumonia   . Shortness of breath    lying down  . Shortness of breath on exertion    Past Surgical History:  Procedure Laterality Date  . ABDOMINAL HYSTERECTOMY     partial  . CARDIAC CATHETERIZATION  ~ 2007  . CESAREAN SECTION  1978; 1981  . COLONOSCOPY    . FOOT SURGERY     "had to cut it to  let the fluids out; it had swollen very badly; left foot"    Family History  Problem Relation Age of Onset  . Heart attack Father   . Diabetes Father   . Hypertension Mother   . Heart attack Brother   . Kidney failure Brother   . Diabetes Sister   . Diabetes Sister    Social History   Occupational History  . Not on file  Tobacco Use  . Smoking status: Never Smoker  . Smokeless tobacco: Never Used  Substance and Sexual Activity  . Alcohol use: No     Alcohol/week: 0.0 standard drinks  . Drug use: No  . Sexual activity: Yes    Partners: Male    Birth control/protection: Surgical    Tobacco Counseling Counseling given: Not Answered   Immunizations and Health Maintenance Immunization History  Administered Date(s) Administered  . Influenza, High Dose Seasonal PF 09/19/2018   Health Maintenance Due  Topic Date Due  . Hepatitis C Screening  [birth year]  . FOOT EXAM  01/24/1963  . OPHTHALMOLOGY EXAM  01/24/1963  . TETANUS/TDAP  01/25/1972  . PAP SMEAR-Modifier  01/24/1974  . MAMMOGRAM  01/24/2003  . DEXA SCAN  01/24/2018  . PNA vac Low Risk Adult (1 of 2 - PCV13) 01/24/2018    Activities of Daily Living In your present state of health, do you have any difficulty performing the following activities: 11/26/2018 11/14/2018  Hearing? N N  Vision? Y Y  Difficulty concentrating or making decisions? Tempie Donning  Walking or climbing stairs? Y Y  Dressing or bathing? Y Y  Doing errands, shopping? N N  Some recent data might be hidden    Physical Exam  Done above, or other factors deemed appropriate based on the beneficiary's medical and social history and current clinical standards.  Advanced Directives: Does Patient Have a Medical Advance Directive?: No Would patient like information on creating a medical advance directive?: Yes (MAU/Ambulatory/Procedural Areas - Information given)    Assessment:    This is a routine wellness examination for this patient .   Vision/Hearing screen No exam data present  Dietary issues and exercise activities discussed:     Goals    . HEMOGLOBIN A1C < 7.0     I would like to get my HgbA1c below 7      Depression Screen PHQ 2/9 Scores 11/26/2018 11/14/2018 08/19/2018  PHQ - 2 Score 3 3 2   PHQ- 9 Score 10 10 -     Fall Risk Fall Risk  11/26/2018  Falls in the past year? 0  Number falls in past yr: -  Injury with Fall? -  Risk Factor Category  -    Cognitive Function:         Patient Care Team: [name], FNP as PCP - General ([place]) Nahser, Wonda Cheng, MD as PCP - Cardiology (Cardiology) Jacolyn Reedy, MD as Consulting Physician (Cardiology)     Plan:     I have personally reviewed and noted the following in the patient's chart:   . Medical and social history . Use of alcohol, tobacco or illicit drugs  . Current medications and supplements . Functional ability and status . Nutritional status . Physical activity . Advanced directives . List of other physicians . Hospitalizations, surgeries, and ER visits in previous 12 months . Vitals . Screenings to include cognitive, depression, and falls . Referrals and appointments  In addition, I have reviewed and discussed with patient certain preventive protocols, quality metrics,  and best practice recommendations. A written personalized care plan for preventive services as well as general preventive health recommendations were provided to patient.     [name], FNP 11/26/2018

## 2018-11-27 ENCOUNTER — Other Ambulatory Visit: Payer: Self-pay

## 2018-11-27 ENCOUNTER — Telehealth: Payer: Self-pay

## 2018-11-27 ENCOUNTER — Ambulatory Visit (HOSPITAL_COMMUNITY)
Admission: RE | Admit: 2018-11-27 | Discharge: 2018-11-27 | Disposition: A | Discharge status: Home / Self Care | Payer: PPO | Source: Ambulatory Visit | Attending: Internal Medicine | Admitting: Internal Medicine

## 2018-11-27 ENCOUNTER — Inpatient Hospital Stay: Payer: PPO | Attending: Internal Medicine | Admitting: Internal Medicine

## 2018-11-27 ENCOUNTER — Inpatient Hospital Stay: Payer: PPO

## 2018-11-27 ENCOUNTER — Encounter: Payer: Self-pay | Admitting: Internal Medicine

## 2018-11-27 ENCOUNTER — Ambulatory Visit (HOSPITAL_COMMUNITY): Payer: PPO

## 2018-11-27 VITALS — BP 169/92 | HR 80 | Temp 98.2°F | Resp 17 | Ht 62.5 in | Wt 165.0 lb

## 2018-11-27 DIAGNOSIS — M2550 Pain in unspecified joint: Secondary | ICD-10-CM | POA: Diagnosis not present

## 2018-11-27 DIAGNOSIS — I10 Essential (primary) hypertension: Secondary | ICD-10-CM

## 2018-11-27 DIAGNOSIS — M5032 Other cervical disc degeneration, mid-cervical region, unspecified level: Secondary | ICD-10-CM | POA: Diagnosis not present

## 2018-11-27 DIAGNOSIS — Z8673 Personal history of transient ischemic attack (TIA), and cerebral infarction without residual deficits: Secondary | ICD-10-CM | POA: Diagnosis not present

## 2018-11-27 DIAGNOSIS — M899 Disorder of bone, unspecified: Secondary | ICD-10-CM

## 2018-11-27 DIAGNOSIS — R899 Unspecified abnormal finding in specimens from other organs, systems and tissues: Secondary | ICD-10-CM | POA: Diagnosis not present

## 2018-11-27 DIAGNOSIS — M5136 Other intervertebral disc degeneration, lumbar region: Secondary | ICD-10-CM | POA: Diagnosis not present

## 2018-11-27 DIAGNOSIS — M1990 Unspecified osteoarthritis, unspecified site: Secondary | ICD-10-CM | POA: Diagnosis not present

## 2018-11-27 DIAGNOSIS — M170 Bilateral primary osteoarthritis of knee: Secondary | ICD-10-CM | POA: Diagnosis not present

## 2018-11-27 DIAGNOSIS — R768 Other specified abnormal immunological findings in serum: Secondary | ICD-10-CM | POA: Diagnosis not present

## 2018-11-27 DIAGNOSIS — I6330 Cerebral infarction due to thrombosis of unspecified cerebral artery: Secondary | ICD-10-CM

## 2018-11-27 LAB — COMPREHENSIVE METABOLIC PANEL
ALT: 11 U/L (ref 0–44)
AST: 17 U/L (ref 15–41)
Albumin: 3.8 g/dL (ref 3.5–5.0)
Alkaline Phosphatase: 110 U/L (ref 38–126)
Anion gap: 11 (ref 5–15)
BUN: 13 mg/dL (ref 8–23)
CO2: 29 mmol/L (ref 22–32)
Calcium: 9.5 mg/dL (ref 8.9–10.3)
Chloride: 103 mmol/L (ref 98–111)
Creatinine, Ser: 1 mg/dL (ref 0.44–1.00)
GFR calc Af Amer: >60 mL/min (ref >60)
GFR calc non Af Amer: 59 mL/min — ABNORMAL LOW (ref >60)
Glucose, Bld: 118 mg/dL — ABNORMAL HIGH (ref 70–99)
Potassium: 3.5 mmol/L (ref 3.5–5.1)
Sodium: 143 mmol/L (ref 135–145)
Total Bilirubin: 1.3 mg/dL — ABNORMAL HIGH (ref 0.3–1.2)
Total Protein: 7.6 g/dL (ref 6.5–8.1)

## 2018-11-27 LAB — CBC WITH DIFFERENTIAL/PLATELET
Abs Immature Granulocytes: 0.01 10*3/uL (ref 0.00–0.07)
Basophils Absolute: 0 10*3/uL (ref 0.0–0.1)
Basophils Relative: 0 %
Eosinophils Absolute: 0.2 10*3/uL (ref 0.0–0.5)
Eosinophils Relative: 4 %
HCT: 44.6 % (ref 36.0–46.0)
Hemoglobin: 14.3 g/dL (ref 12.0–15.0)
Immature Granulocytes: 0 %
Lymphocytes Relative: 20 %
Lymphs Abs: 0.9 10*3/uL (ref 0.7–4.0)
MCH: 28.9 pg (ref 26.0–34.0)
MCHC: 32.1 g/dL (ref 30.0–36.0)
MCV: 90.3 fL (ref 80.0–100.0)
Monocytes Absolute: 0.4 10*3/uL (ref 0.1–1.0)
Monocytes Relative: 9 %
Neutro Abs: 3.1 10*3/uL (ref 1.7–7.7)
Neutrophils Relative %: 67 %
Platelets: 269 10*3/uL (ref 150–400)
RBC: 4.94 MIL/uL (ref 3.87–5.11)
RDW: 13.7 % (ref 11.5–15.5)
WBC: 4.7 10*3/uL (ref 4.0–10.5)
nRBC: 0 % (ref 0.0–0.2)

## 2018-11-27 LAB — LACTATE DEHYDROGENASE: LDH: 216 U/L — ABNORMAL HIGH (ref 98–192)

## 2018-11-27 NOTE — Progress Notes (Signed)
Referring Physician:  [name], FNP  Diagnosis Abnormal laboratory test - Plan: CBC with Differential/Platelet, Comprehensive metabolic panel, Lactate dehydrogenase, Protein electrophoresis, serum, IgG, IgA, IgM, Kappa/lambda light chains, DG Bone Survey Met  Staging Cancer Staging No matching staging information was found for the patient.  Assessment and Plan: 1.  Abnormal Xray and lab findings.   67 yr old female referred for evaluation due to abnormal labs and bone lesion noted on MRI done in 07/2018. Pt has stroke history and is on Plavix.  She had labs done 07/14/2018 that showed K/L ratio of 1.86.  SPEP was negative.  MRI of brain done 07/12/2018 reviewed with report showing:     Infarct and abnormal marrow signal of the clivus, new since 2016 and without CT correlate. This could be due to metastatic disease or myeloma.    Unclear if pt has continued to follow-up with neurology.  Pt reports arthritis history.  She has not had mammogram.  Last colonoscopy was in 2016 and showed diverticulitis.  Pt seen today for consultation due to reported elevated light chains and bone lesion on MRI.    Labs done 11/14/2018 reviewed and showed WBC 4.4 HB 14.7 plts 325,000.  Chemistries WNL with K+ 3.6 Cr 0.88 Ca 9.5 normal LFTs.  PT had minimal K/L light chain ratio of 1.86.  SPEP was negative.  Will repeat labs today and have skeletal survey for further evaluation for any bone lesions.  Based on lab review findings are not consistent with myeloma findings based on lab results. Pt will RTC to go over lab results and xrays.  All questions answered and she expressed understanding of the information presented.   2.  Bone lesion reported on MRI.  Pt is set up for skeletal survey and will RTC to go over results.    3.  Stroke.  Pt is referred to Neurology for evaluation and follow-up.    4.  HTN.  BP is 169/92. Pt should follow-up with PCP.   5.  Arthritis.  Pt should follow-up with PCP.  Recent SPEP was  negative.    6.  Health maintenance.  Pt has not had mammogram and last colonoscopy was in 2016.  Pt should follow-up with PCP to discuss health screenings.    40 minutes spent with more than 50% spent in review of records, counseling and coordination of care.    HPI:  67 yr old female referred for evaluation due to abnormal labs and bone lesion noted on MRI done in 07/2018. Pt has stroke history and is on Plavix.  She had labs done 07/14/2018 that showed K/L ratio of 1.86.  SPEP was negative.  MRI of brain done 07/12/2018 showed infarct and Abnormal marrow signal of the clivus, new since 2016 and without CT correlate. This could be due to metastatic disease or myeloma.  Unclear if pt has continued to follow-up with neurology.  Pt reports arthritis history.  She has not had mammogram.  Last colonoscopy was in 2016 and showed diverticulitis.  Pt seen today for consultation due to reported elevated light chains and bone lesion on MRI.     Problem List Patient Active Problem List   Diagnosis Date Noted  . Palpitations [R00.2] 07/16/2018  . Cerebral thrombosis with cerebral infarction [I63.30] 07/12/2018  . Chest pain [R07.9] 07/11/2018  . Abnormal EKG [R94.31] 07/11/2018  . AKI (acute kidney injury) ([place]) [N17.9] 07/11/2018  . Essential hypertension [I10]   . Retrognathia [M26.19] 07/10/2018  . Psychophysiological insomnia [F51.04]  07/10/2018  . Snoring [R06.83] 07/10/2018  . History of lupus ([place]) [M32.9] 01/02/2012  . CAD (coronary artery disease) [I25.10]   . Diabetes mellitus type 2, noninsulin dependent ([place]) [E11.9]   . Hypertensive heart disease without CHF [I11.9]   . Hyperlipidemia [E78.5]   . Obesity (BMI 30-39.9) [E66.9]   . GERD (gastroesophageal reflux disease) [K21.9]   . Osteoarthritis [M19.90]     Past Medical History Past Medical History:  Diagnosis Date  . Angina   . Bronchitis   . Diabetes mellitus   . Fibromyalgia   . GERD (gastroesophageal reflux disease)   .  Headache(784.0)   . Hyperlipidemia   . Hypertension   . Hypertensive heart disease without CHF   . Lupus ([place])    "treated for it from 1992 til 2012; dr said I don't have it anymore"  . Obesity (BMI 30-39.9)   . Osteoarthritis   . Pneumonia   . Shortness of breath    lying down  . Shortness of breath on exertion     Past Surgical History Past Surgical History:  Procedure Laterality Date  . ABDOMINAL HYSTERECTOMY     partial  . CARDIAC CATHETERIZATION  ~ 2007  . CESAREAN SECTION  1978; 1981  . COLONOSCOPY    . FOOT SURGERY     "had to cut it to let the fluids out; it had swollen very badly; left foot"    Family History Family History  Problem Relation Age of Onset  . Heart attack Father   . Diabetes Father   . Hypertension Mother   . Heart attack Brother   . Kidney failure Brother   . Diabetes Sister   . Diabetes Sister      Social History  reports that she has never smoked. She has never used smokeless tobacco. She reports that she does not drink alcohol or use drugs.  Medications  Current Outpatient Medications:  .  acetaminophen (TYLENOL) 500 MG tablet, Take 500 mg by mouth as needed for mild pain., Disp: , Rfl:  .  amLODipine (NORVASC) 10 MG tablet, Take 10 mg by mouth daily., Disp: , Rfl:  .  aspirin EC 81 MG tablet, Take 81 mg by mouth daily as needed for mild pain. , Disp: , Rfl:  .  clopidogrel (PLAVIX) 75 MG tablet, Take 1 tablet (75 mg total) by mouth daily., Disp: 30 tablet, Rfl: 11 .  empagliflozin (JARDIANCE) 10 MG TABS tablet, Take 10 mg by mouth daily., Disp: , Rfl:  .  KLOR-CON M20 20 MEQ tablet, Take 20 mEq by mouth daily. , Disp: , Rfl: 0 .  nitroGLYCERIN (NITROSTAT) 0.4 MG SL tablet, Place 1 tablet (0.4 mg total) under the tongue every 5 (five) minutes as needed for chest pain., Disp: 25 tablet, Rfl: 12 .  pantoprazole (PROTONIX) 40 MG tablet, Take 1 tablet (40 mg total) by mouth daily at 12 noon., Disp: 30 tablet, Rfl: 12 .  rosuvastatin  (CRESTOR) 20 MG tablet, Take 1 tablet (20 mg total) by mouth daily., Disp: 30 tablet, Rfl: 3 .  valsartan-hydrochlorothiazide (DIOVAN HCT) 160-12.5 MG tablet, Take 1 tablet by mouth daily., Disp: 30 tablet, Rfl: 2 .  Vitamin D, Ergocalciferol, (DRISDOL) 50000 units CAPS capsule, Take 50,000 Units by mouth every 7 (seven) days., Disp: , Rfl:   Allergies Sulfa antibiotics  Review of Systems Review of Systems - Oncology ROS negative other than arthritis symptoms   Physical Exam  Vitals Wt Readings from Last 3 Encounters:  11/27/18  165 lb (74.8 kg)  11/26/18 167 lb 6.4 oz (75.9 kg)  11/14/18 166 lb 12.8 oz (75.7 kg)   Temp Readings from Last 3 Encounters:  11/27/18 98.2 F (36.8 C) (Oral)  11/26/18 98.5 F (36.9 C) (Oral)  11/14/18 98.9 F (37.2 C) (Oral)   BP Readings from Last 3 Encounters:  11/27/18 (!) 169/92  11/26/18 (!) 172/100  11/14/18 (!) 180/110   Pulse Readings from Last 3 Encounters:  11/27/18 80  11/26/18 85  11/14/18 74   Constitutional: Well-developed, well-nourished, and in no distress.   HENT: Head: Normocephalic and atraumatic.  Mouth/Throat: No oropharyngeal exudate. Mucosa moist. Eyes: Pupils are equal, round, and reactive to light. Conjunctivae are normal. No scleral icterus.  Neck: Normal range of motion. Neck supple. No JVD present.  Cardiovascular: Normal rate, regular rhythm and normal heart sounds.  Exam reveals no gallop and no friction rub.   No murmur heard. Pulmonary/Chest: Effort normal and breath sounds normal. No respiratory distress. No wheezes.No rales.  Abdominal: Soft. Bowel sounds are normal. No distension. There is no tenderness. There is no guarding.  Musculoskeletal: No edema or tenderness.  Lymphadenopathy: No cervical,axillary or supraclavicular adenopathy.  Neurological: Alert and oriented to person, place, and time. No cranial nerve deficit.  Skin: Skin is warm and dry. No rash noted. No erythema. No pallor.  Psychiatric:  Affect and judgment normal.   Labs No visits with results within 3 Day(s) from this visit.  Latest known visit with results is:  Clinical Support on 11/14/2018  Component Date Value Ref Range Status  . WBC 11/14/2018 4.4  3.4 - 10.8 x10E3/uL Final  . RBC 11/14/2018 5.03  3.77 - 5.28 x10E6/uL Final  . Hemoglobin 11/14/2018 14.7  11.1 - 15.9 g/dL Final  . Hematocrit 11/14/2018 44.3  34.0 - 46.6 % Final  . MCV 11/14/2018 88  79 - 97 fL Final  . MCH 11/14/2018 29.2  26.6 - 33.0 pg Final  . MCHC 11/14/2018 33.2  31.5 - 35.7 g/dL Final  . RDW 11/14/2018 13.5  12.3 - 15.4 % Final  . Platelets 11/14/2018 325  150 - 450 x10E3/uL Final  . Glucose 11/14/2018 107* 65 - 99 mg/dL Final  . BUN 11/14/2018 13  8 - 27 mg/dL Final  . Creatinine, Ser 11/14/2018 0.88  0.57 - 1.00 mg/dL Final  . GFR calc non Af Amer 11/14/2018 69  >59 mL/min/1.73 Final  . GFR calc Af Amer 11/14/2018 80  >59 mL/min/1.73 Final  . BUN/Creatinine Ratio 11/14/2018 15  12 - 28 Final  . Sodium 11/14/2018 142  134 - 144 mmol/L Final  . Potassium 11/14/2018 3.6  3.5 - 5.2 mmol/L Final  . Chloride 11/14/2018 102  96 - 106 mmol/L Final  . CO2 11/14/2018 25  20 - 29 mmol/L Final  . Anion Gap 11/14/2018 15.0  10.0 - 18.0 mmol/L Final  . Calcium 11/14/2018 9.5  8.7 - 10.3 mg/dL Final  . Total Protein 11/14/2018 7.0  6.0 - 8.5 g/dL Final  . Albumin 11/14/2018 4.3  3.6 - 4.8 g/dL Final  . Globulin, Total 11/14/2018 2.7  1.5 - 4.5 g/dL Final  . Albumin/Globulin Ratio 11/14/2018 1.6  1.2 - 2.2 Final  . Bilirubin Total 11/14/2018 0.8  0.0 - 1.2 mg/dL Final  . Alkaline Phosphatase 11/14/2018 111  39 - 117 IU/L Final  . AST 11/14/2018 21  0 - 40 IU/L Final  . ALT 11/14/2018 13  0 - 32 IU/L Final  . Hgb A1c  MFr Bld 11/14/2018 7.4* 4.8 - 5.6 % Final   Comment:          Prediabetes: 5.7 - 6.4          Diabetes: >6.4          Glycemic control for adults with diabetes: <7.0   . Est. average glucose Bld gHb Est-m* 11/14/2018 166  mg/dL  Final     Pathology Orders Placed This Encounter  Procedures  . DG Bone Survey Met    Standing Status:   Future    Standing Expiration Date:   01/29/2020    Order Specific Question:   Reason for Exam (SYMPTOM  OR DIAGNOSIS REQUIRED)    Answer:   reported skeletal lesion on MRI    Order Specific Question:   Preferred imaging location?    Answer:   [facility]    Order Specific Question:   Radiology Contrast Protocol - do NOT remove file path    Answer:   [email or web address]  . CBC with Differential/Platelet    Standing Status:   Future    Standing Expiration Date:   11/28/2019  . Comprehensive metabolic panel    Standing Status:   Future    Standing Expiration Date:   11/28/2019  . Lactate dehydrogenase    Standing Status:   Future    Standing Expiration Date:   11/28/2019  . Protein electrophoresis, serum    Standing Status:   Future    Standing Expiration Date:   11/28/2019  . IgG, IgA, IgM    Standing Status:   Future    Standing Expiration Date:   11/28/2019  . Kappa/lambda light chains    Standing Status:   Future    Standing Expiration Date:   11/28/2019       Zoila Shutter MD

## 2018-11-27 NOTE — Telephone Encounter (Signed)
Printed avs and calender of upcoming appointment. Per 12/19 los 

## 2018-11-28 ENCOUNTER — Other Ambulatory Visit: Payer: Self-pay | Admitting: Radiation Therapy

## 2018-11-28 ENCOUNTER — Telehealth: Payer: Self-pay | Admitting: *Deleted

## 2018-11-28 LAB — KAPPA/LAMBDA LIGHT CHAINS
Kappa free light chain: 27.4 mg/L — ABNORMAL HIGH (ref 3.3–19.4)
Kappa, lambda light chain ratio: 1.7 — ABNORMAL HIGH (ref 0.26–1.65)
Lambda free light chains: 16.1 mg/L (ref 5.7–26.3)

## 2018-11-28 LAB — IGG, IGA, IGM
IgA: 455 mg/dL — ABNORMAL HIGH (ref 87–352)
IgG (Immunoglobin G), Serum: 1201 mg/dL (ref 700–1600)
IgM (Immunoglobulin M), Srm: 27 mg/dL (ref 26–217)

## 2018-11-28 NOTE — Progress Notes (Signed)
Attempted to call pt regarding report.  Finding in Clivus similar to prior MRI done in 07/2018.  Have discussed case with Dr. Mickeal Skinner of neuro-oncology who will see pt for evaluation.  Pt will follow-up as directed to go over labs.

## 2018-11-28 NOTE — Telephone Encounter (Signed)
Attempted to call patient as Dr. Walden Field wanted to talk to patient about her bone survey from 11/27/18. No answer and voice mailbox was full. Attempted call to temporary #. No answer, no vm. Message left on her husband's vm to have pt call back today if possible and have call transferred to Dr. Walden Field.

## 2018-12-01 LAB — PROTEIN ELECTROPHORESIS, SERUM
A/G Ratio: 1.2 (ref 0.7–1.7)
Albumin ELP: 3.8 g/dL (ref 2.9–4.4)
Alpha-1-Globulin: 0.3 g/dL (ref 0.0–0.4)
Alpha-2-Globulin: 0.6 g/dL (ref 0.4–1.0)
Beta Globulin: 1.3 g/dL (ref 0.7–1.3)
Gamma Globulin: 1.1 g/dL (ref 0.4–1.8)
Globulin, Total: 3.3 g/dL (ref 2.2–3.9)
Total Protein ELP: 7.1 g/dL (ref 6.0–8.5)

## 2018-12-05 ENCOUNTER — Ambulatory Visit: Payer: PPO | Admitting: Nurse Practitioner

## 2018-12-08 ENCOUNTER — Encounter: Payer: Self-pay | Admitting: Nurse Practitioner

## 2018-12-08 DIAGNOSIS — H35039 Hypertensive retinopathy, unspecified eye: Secondary | ICD-10-CM | POA: Insufficient documentation

## 2018-12-15 ENCOUNTER — Inpatient Hospital Stay: Payer: PPO | Attending: Internal Medicine | Admitting: Internal Medicine

## 2018-12-15 ENCOUNTER — Encounter: Payer: Self-pay | Admitting: Internal Medicine

## 2018-12-15 ENCOUNTER — Other Ambulatory Visit: Payer: PPO

## 2018-12-15 ENCOUNTER — Other Ambulatory Visit: Payer: Self-pay

## 2018-12-15 DIAGNOSIS — Z7982 Long term (current) use of aspirin: Secondary | ICD-10-CM | POA: Diagnosis not present

## 2018-12-15 DIAGNOSIS — M899 Disorder of bone, unspecified: Secondary | ICD-10-CM | POA: Diagnosis present

## 2018-12-15 DIAGNOSIS — Z8673 Personal history of transient ischemic attack (TIA), and cerebral infarction without residual deficits: Secondary | ICD-10-CM

## 2018-12-15 DIAGNOSIS — D4989 Neoplasm of unspecified behavior of other specified sites: Secondary | ICD-10-CM

## 2018-12-15 DIAGNOSIS — E119 Type 2 diabetes mellitus without complications: Secondary | ICD-10-CM | POA: Diagnosis not present

## 2018-12-15 DIAGNOSIS — R634 Abnormal weight loss: Secondary | ICD-10-CM

## 2018-12-15 DIAGNOSIS — G9389 Other specified disorders of brain: Secondary | ICD-10-CM | POA: Diagnosis not present

## 2018-12-15 DIAGNOSIS — I10 Essential (primary) hypertension: Secondary | ICD-10-CM | POA: Diagnosis not present

## 2018-12-15 DIAGNOSIS — G9689 Other specified disorders of central nervous system: Secondary | ICD-10-CM | POA: Insufficient documentation

## 2018-12-15 DIAGNOSIS — I639 Cerebral infarction, unspecified: Secondary | ICD-10-CM | POA: Insufficient documentation

## 2018-12-15 DIAGNOSIS — G968 Other specified disorders of central nervous system: Secondary | ICD-10-CM

## 2018-12-15 NOTE — Progress Notes (Signed)
[facility] [place], [place] [address] [phone]   New Patient Evaluation  Date of Service: 12/15/18 Patient Name: Bridget Good Patient MRN: [id number] Patient DOB: [birth year] Provider: Ventura Sellers, MD  Identifying Statement:  Bridget Good is a 68 y.o. 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    Referring Provider: [name], [address], [place] [address]  Primary Cancer: unknown, pending workup  History of Present Illness: The patient's records from the referring physician were obtained and reviewed and the patient interviewed to confirm this HPI.  Bridget Good presents after referral for abnormal brain MRI.  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  There were and are no symptoms clearly associated with the bony mass.  Referral was initially placed for Dr. Walden Field given abnormal light chains found on serology.  Per patient and Dr. Walden Field, further workup has not uncovered myeloma or other related malignancy to this point.  Skeletal survey was also performed and was normal.  Still, patient has not had CT scans of the body, and no CNS imaging in more than 5 months.  She maintains good functional status, walks with a cane because of orthopedic limitations.  Double vision has resolved.  Of related concern is that she describes greater than 100lbs unintentional weight loss over the past year.   Medications: Current Outpatient Medications on File Prior to Visit  Medication Sig Dispense Refill  . acetaminophen (TYLENOL) 500 MG tablet Take 500 mg by mouth as needed for mild pain.    Marland Kitchen amLODipine (NORVASC) 10 MG tablet  Take 10 mg by mouth daily.    Marland Kitchen aspirin EC 81 MG tablet Take 81 mg by mouth daily as needed for mild pain.     Marland Kitchen clopidogrel (PLAVIX) 75 MG tablet Take 1 tablet (75 mg total) by mouth daily. 30 tablet 11  . empagliflozin (JARDIANCE) 10 MG TABS tablet Take 10 mg by mouth daily.    Marland Kitchen KLOR-CON M20 20 MEQ tablet Take 20 mEq by mouth daily.   0  . pantoprazole (PROTONIX) 40 MG tablet Take 1 tablet (40 mg total) by mouth daily at 12 noon. 30 tablet 12  . rosuvastatin (CRESTOR) 20 MG tablet Take 1 tablet (20 mg total) by mouth daily. 30 tablet 3  . valsartan-hydrochlorothiazide (DIOVAN HCT) 160-12.5 MG tablet Take 1 tablet by mouth daily. 30 tablet 2  . Vitamin D, Ergocalciferol, (DRISDOL) 50000 units CAPS capsule Take 50,000 Units by mouth every 7 (seven) days.    . nitroGLYCERIN (NITROSTAT) 0.4 MG SL tablet Place 1 tablet (0.4 mg total) under the tongue every 5 (five) minutes as needed for chest pain. (Patient not taking: Reported on 12/15/2018) 25 tablet 12   No current facility-administered medications on file prior to visit.     Allergies:  Allergies  Allergen Reactions  . Sulfa Antibiotics Hives, Itching and Nausea And Vomiting   Past Medical History:  Past Medical History:  Diagnosis Date  . Angina   . Bronchitis   . Diabetes mellitus   . Fibromyalgia   . GERD (gastroesophageal reflux disease)   . Headache(784.0)   . Hyperlipidemia   . Hypertension   .  Hypertensive heart disease without CHF   . Lupus ([place])    "treated for it from 1992 til 2012; dr said I don't have it anymore"  . Obesity (BMI 30-39.9)   . Osteoarthritis   . Pneumonia   . Shortness of breath    lying down  . Shortness of breath on exertion    Past Surgical History:  Past Surgical History:  Procedure Laterality Date  . ABDOMINAL HYSTERECTOMY     partial  . CARDIAC CATHETERIZATION  ~ 2007  . CESAREAN SECTION  1978; 1981  . COLONOSCOPY    . FOOT SURGERY     "had to cut it to let the fluids out; it had  swollen very badly; left foot"   Social History:  Social History   Socioeconomic History  . Marital status: Married    Spouse name: Not on file  . Number of children: Not on file  . Years of education: Not on file  . Highest education level: Not on file  Occupational History  . Not on file  Social Needs  . Financial resource strain: Not on file  . Food insecurity:    Worry: Not on file    Inability: Not on file  . Transportation needs:    Medical: Not on file    Non-medical: Not on file  Tobacco Use  . Smoking status: Never Smoker  . Smokeless tobacco: Never Used  Substance and Sexual Activity  . Alcohol use: No    Alcohol/week: 0.0 standard drinks  . Drug use: No  . Sexual activity: Yes    Partners: Male    Birth control/protection: Surgical  Lifestyle  . Physical activity:    Days per week: Not on file    Minutes per session: Not on file  . Stress: Not on file  Relationships  . Social connections:    Talks on phone: Not on file    Gets together: Not on file    Attends religious service: Not on file    Active member of club or organization: Not on file    Attends meetings of clubs or organizations: Not on file    Relationship status: Not on file  . Intimate partner violence:    Fear of current or ex partner: Not on file    Emotionally abused: Not on file    Physically abused: Not on file    Forced sexual activity: Not on file  Other Topics Concern  . Not on file  Social History Narrative   Married.  Husband is psychotherapist.  Several children.  Husband is [employer] manager [employer] in [place].   Family History:  Family History  Problem Relation Age of Onset  . Heart attack Father   . Diabetes Father   . Hypertension Mother   . Heart attack Brother   . Kidney failure Brother   . Diabetes Sister   . Diabetes Sister     Review of Systems: Constitutional: Denies fevers, chills or abnormal weight loss Eyes: Denies blurriness of vision Ears, nose,  mouth, throat, and face: Denies mucositis or sore throat Respiratory: Denies cough, dyspnea or wheezes Cardiovascular: Denies palpitation, chest discomfort or lower extremity swelling Gastrointestinal:  Denies nausea, constipation, diarrhea GU: Denies dysuria or incontinence Skin: Denies abnormal skin rashes Neurological: Per HPI Musculoskeletal: joint pain Behavioral/Psych: +anxiety  Physical Exam: Vitals:   12/15/18 1035  BP: (!) 165/93  Pulse: 85  Resp: 18  Temp: 98.4 F (36.9 C)  SpO2: 99%  KPS: 80. General: Alert, cooperative, pleasant, in no acute distress Head: Normal EENT: No conjunctival injection or scleral icterus. Oral mucosa moist Lungs: Resp effort normal Cardiac: Regular rate and rhythm Abdomen: Soft, non-distended abdomen Skin: No rashes cyanosis or petechiae. Extremities: No clubbing or edema  Neurologic Exam: Mental Status: Awake, alert, attentive to examiner. Oriented to self and environment. Language is fluent with intact comprehension.  Cranial Nerves: Visual acuity is grossly normal. Visual fields are full. Extra-ocular movements intact. No ptosis. Face is symmetric, tongue midline. Motor: Tone and bulk are normal. Power is full in both arms and legs. Reflexes are symmetric, no pathologic reflexes present. Intact finger to nose bilaterally Sensory: Intact to light touch and temperature Gait: Normal and tandem gait is normal.   Labs: I have reviewed the data as listed    Component Value Date/Time   NA 143 11/27/2018 1055   NA 142 11/14/2018 1432   K 3.5 11/27/2018 1055   CL 103 11/27/2018 1055   CO2 29 11/27/2018 1055   GLUCOSE 118 (H) 11/27/2018 1055   BUN 13 11/27/2018 1055   BUN 13 11/14/2018 1432   CREATININE 1.00 11/27/2018 1055   CALCIUM 9.5 11/27/2018 1055   PROT 7.6 11/27/2018 1055   PROT 7.0 11/14/2018 1432   ALBUMIN 3.8 11/27/2018 1055   ALBUMIN 4.3 11/14/2018 1432   AST 17 11/27/2018 1055   ALT 11 11/27/2018 1055   ALKPHOS 110  11/27/2018 1055   BILITOT 1.3 (H) 11/27/2018 1055   BILITOT 0.8 11/14/2018 1432   GFRNONAA 59 (L) 11/27/2018 1055   GFRAA >60 11/27/2018 1055   Lab Results  Component Value Date   WBC 4.7 11/27/2018   NEUTROABS 3.1 11/27/2018   HGB 14.3 11/27/2018   HCT 44.6 11/27/2018   MCV 90.3 11/27/2018   PLT 269 11/27/2018    Imaging:  CLINICAL DATA:  Acute visual disturbance.  Negative head CT.  EXAM: MRI HEAD WITHOUT AND WITH CONTRAST  MRA HEAD WITHOUT CONTRAST  TECHNIQUE: Multiplanar, multiecho pulse sequences of the brain and surrounding structures were obtained without and with intravenous contrast. Angiographic images of the head were obtained using MRA technique without contrast.  CONTRAST:  55mL MULTIHANCE GADOBENATE DIMEGLUMINE 529 MG/ML IV SOLN  COMPARISON:  Head CT 07/11/2018.  MRI 05/26/2015.  FINDINGS: MRI HEAD FINDINGS  Brain: Diffusion imaging does not show any acute or subacute infarction. There chronic small-vessel ischemic changes affecting the pons. No focal cerebellar insult. Cerebral hemispheres show chronic small-vessel ischemic changes of the thalami, basal ganglia and hemispheric deep and subcortical white matter. No cortical or large vessel territory infarction. No mass lesion, hemorrhage, hydrocephalus or extra-axial collection. After contrast administration, no abnormal enhancement occurs.  Vascular: Major vessels at the base of the brain show flow.  Skull and upper cervical spine: No calvarial lesions seen.Abnormal appearance of the clivus, new since the study of 2016. No visible CT correlate. This is worrisome for the possibility of metastatic disease or myeloma.  Sinuses/Orbits: Clear/normal  Other: None  MRA HEAD FINDINGS  Both internal carotid arteries are patent through the skull base and siphon regions. There is signal loss in the carotid siphon regions which may be artifactual. Cannot rule out siphon stenosis.  The anterior and middle cerebral vessels are patent. No correctable proximal stenosis. Both vertebral arteries are patent to the basilar. No basilar stenosis. Posterior circulation branch vessels show flow.  IMPRESSION: No acute brain finding. Chronic small-vessel ischemic changes throughout the brain.  Intracranial MR angiography does not  show any large or medium vessel occlusion or correctable proximal stenosis. Signal loss in the carotid siphon regions is probably artifactual, but I cannot rule out a siphon stenosis.  Abnormal marrow signal of the clivus, new since 2016 and without CT correlate. This could be due to metastatic disease or myeloma.  Electronically Signed: By: Nelson Chimes M.D. On: 07/12/2018 16:51   Assessment/Plan  1. CNS mass 2. Cerebrovascular accident (CVA), unspecified mechanism ([place])  Bridget Good is clinically stable today.  Her stroke symptoms have resolved.  For stroke we recommend continuation of daily Plavix, high dose statin, blood pressure and DM control.    Of greater concern at this time is the unresolved etiology of her skull based tumor/mass.  This could represent a metastatic focus (unknown primary) or alternately primary neoplasm such as chordoma.  Although she feels generally well, there is concern regarding her significant unintentional weight loss.  We recommended initiating systemic malignancy screening with contrast enhanced CT chest/abdomen/pelvis.  In addition, MRI brain should be repeated to assess growth rate of the bony lesion.    We asked her to follow up with [place] ASAP (contact info provided) once these tests were completed.  She should also continue to follow with Dr. Walden Field.  We spent twenty additional minutes teaching regarding the natural history, biology, and historical experience in the treatment of neurologic complications of cancer. We also provided teaching sheets for the patient to take home as an additional  resource.  We appreciate the opportunity to participate in the care of Bridget Good.   All questions were answered. The patient knows to call the clinic with any problems, questions or concerns. No barriers to learning were detected.  The total time spent in the encounter was 40 minutes and more than 50% was on counseling and review of test results   Ventura Sellers, MD Medical Director of Neuro-Oncology [facility] [place] 12/15/18 3:09 PM

## 2018-12-16 ENCOUNTER — Telehealth: Payer: Self-pay

## 2018-12-16 NOTE — Telephone Encounter (Signed)
Per 1/7 no los 

## 2018-12-16 NOTE — Progress Notes (Signed)
Brain and Spine Tumor Board Documentation  Bridget Good was presented by Cecil Cobbs, MD at Brain and Spine Tumor Board on 12/16/2018, which included representatives from neuro oncology, radiation oncology, surgical oncology, radiology, pathology, navigation.  Bridget Good was presented as a current patient with history of the following treatments:  .  Additionally, we reviewed previous medical and familial history, history of present illness, and recent lab results along with all available histopathologic and imaging studies. The tumor board considered available treatment options and made the following recommendations:  Additional screening systemic malingnacy screening (CTs) and repeat MRI brain  Tumor board is a meeting of clinicians from various specialty areas who evaluate and discuss patients for whom a multidisciplinary approach is being considered. Final determinations in the plan of care are those of the provider(s). The responsibility for follow up of recommendations given during tumor board is that of the provider.   Today's extended care, comprehensive team conference, Bridget Good was not present for the discussion and was not examined.

## 2018-12-17 ENCOUNTER — Ambulatory Visit
Admission: RE | Admit: 2018-12-17 | Discharge: 2018-12-17 | Disposition: A | Discharge status: Home / Self Care | Payer: PPO | Source: Ambulatory Visit | Attending: Internal Medicine | Admitting: Internal Medicine

## 2018-12-17 DIAGNOSIS — D4989 Neoplasm of unspecified behavior of other specified sites: Secondary | ICD-10-CM

## 2018-12-17 DIAGNOSIS — K5730 Diverticulosis of large intestine without perforation or abscess without bleeding: Secondary | ICD-10-CM | POA: Diagnosis not present

## 2018-12-17 DIAGNOSIS — R918 Other nonspecific abnormal finding of lung field: Secondary | ICD-10-CM | POA: Diagnosis not present

## 2018-12-17 MED ORDER — IOPAMIDOL (ISOVUE-300) INJECTION 61%
100.0000 mL | Freq: Once | INTRAVENOUS | Status: AC | PRN
  Administered 2018-12-17: 100 mL via INTRAVENOUS

## 2018-12-18 ENCOUNTER — Other Ambulatory Visit: Payer: Self-pay | Admitting: Radiation Therapy

## 2018-12-18 ENCOUNTER — Other Ambulatory Visit: Payer: Self-pay

## 2018-12-18 ENCOUNTER — Telehealth: Payer: Self-pay | Admitting: Internal Medicine

## 2018-12-18 ENCOUNTER — Inpatient Hospital Stay (HOSPITAL_BASED_OUTPATIENT_CLINIC_OR_DEPARTMENT_OTHER): Payer: PPO | Admitting: Internal Medicine

## 2018-12-18 ENCOUNTER — Encounter: Payer: Self-pay | Admitting: Internal Medicine

## 2018-12-18 VITALS — BP 168/88 | HR 73 | Temp 98.4°F | Resp 18 | Wt 170.4 lb

## 2018-12-18 DIAGNOSIS — I10 Essential (primary) hypertension: Secondary | ICD-10-CM | POA: Diagnosis not present

## 2018-12-18 DIAGNOSIS — R899 Unspecified abnormal finding in specimens from other organs, systems and tissues: Secondary | ICD-10-CM

## 2018-12-18 DIAGNOSIS — G9389 Other specified disorders of brain: Secondary | ICD-10-CM

## 2018-12-18 DIAGNOSIS — M899 Disorder of bone, unspecified: Secondary | ICD-10-CM

## 2018-12-18 DIAGNOSIS — Z8673 Personal history of transient ischemic attack (TIA), and cerebral infarction without residual deficits: Secondary | ICD-10-CM

## 2018-12-18 NOTE — Progress Notes (Signed)
Diagnosis Abnormal laboratory test - Plan: CBC with Differential/Platelet, Comprehensive metabolic panel, Lactate dehydrogenase, Protein electrophoresis, serum, Kappa/lambda light chains, IgG, IgA, IgM, MM 3D SCREEN BREAST BILATERAL  Staging Cancer Staging No matching staging information was found for the patient.  Assessment and Plan:  1.  Abnormal Xray and lab findings.   68 yr old female referred for evaluation due to abnormal labs and bone lesion noted on MRI done in 07/2018. Pt has stroke history and is on Plavix.  She had labs done 07/14/2018 that showed K/L ratio of 1.86.  SPEP was negative.  MRI of brain done 07/12/2018 reviewed with report showing:     Infarct and abnormal marrow signal of the clivus, new since 2016 and without CT correlate. This could be due to metastatic disease or myeloma.    Unclear if pt has continued to follow-up with neurology.  Pt reports arthritis history.  She has not had mammogram.  Last colonoscopy was in 2016 and showed diverticulitis.    Labs done 11/14/2018 reviewed and showed WBC 4.4 HB 14.7 plts 325,000.  Chemistries WNL with K+ 3.6 Cr 0.88 Ca 9.5 normal LFTs.  PT had minimal K/L light chain ratio of 1.86.  SPEP was negative.    Labs done 11/27/2018 reviewed and showed WBC 4.7 HB 14.3 plts 269,000.  Chemistries WNL with K+ 3.5 Cr 1 and normal LFTs.  SPEP negative.    Skeletal survey done 11/27/2018 reviewed and showed  IMPRESSION: Abnormal appearance of the clivus as noted on the previous MRI worrisome for a destructive process including malignancy. No significant bony lesions are observed elsewhere. There is a subcentimeter lucency in the distal right humerus which is nonspecific.  There is degenerative disc disease of the cervical and lumbar spines. Degenerative changes of both knees.  Based on lab review findings are not currently consistent with myeloma.  Pt will have repeat labs done in 04/2019 and will follow-up at that time to go over results.    I discussed the clivus findings with Dr. Mickeal Skinner.  Pt has been seen by his office for further evaluation.  She should follow-up with him as recommended.    2.  Bone lesion reported on MRI.  Skeletal survey that was done 11/27/2018 showed abnormal appearance of the clivus as noted on the previous MRI worrisome for a destructive process including malignancy. No significant bony lesions are observed elsewhere. I discussed the case with Dr. Mickeal Skinner of neuro-oncology. He recommended pt for repeat MRI and CT CAP.  Pt will follow-up with him to go over results.  Further recommendations per Dr. Mickeal Skinner.    3.  Stroke.  Pt should follow-up with Dr. Mickeal Skinner and neurology as directed.    4.  HTN.  BP is 168/88.  Pt should follow-up with PCP as directed.   5.  Health maintenance.  Pt has not had mammogram and last colonoscopy was in 2016.  Pt is set up for screening mammogram.    25 minutes spent with more than 50% spent in review of records, counseling and coordination of care.    Interval history:  Historical data obtained from note dated 11/27/2018.  68 yr old female referred for evaluation due to abnormal labs and bone lesion noted on MRI done in 07/2018. Pt has stroke history and is on Plavix.  She had labs done 07/14/2018 that showed K/L ratio of 1.86.  SPEP was negative.  MRI of brain done 07/12/2018 showed infarct and Abnormal marrow signal of the clivus, new since  2016 and without CT correlate. This could be due to metastatic disease or myeloma.  Unclear if pt has continued to follow-up with neurology.  Pt reports arthritis history.  She has not had mammogram.  Last colonoscopy was in 2016 and showed diverticulitis.  Pt seen initially for consultation due to reported elevated light chains and bone lesion on MRI.    Current Status:  Pt is seen today for follow-up.  She is here to go over labs and X-rays.  She has been seen by Dr. Mickeal Skinner.    Problem List Patient Active Problem List   Diagnosis Date Noted  .  CNS mass [G96.8] 12/15/2018  . Stroke ([place]) [I63.9] 12/15/2018  . Hypertensive retinopathy [H35.039] 12/08/2018  . Palpitations [R00.2] 07/16/2018  . Cerebral thrombosis with cerebral infarction [I63.30] 07/12/2018  . Chest pain [R07.9] 07/11/2018  . Abnormal EKG [R94.31] 07/11/2018  . AKI (acute kidney injury) ([place]) [N17.9] 07/11/2018  . Essential hypertension [I10]   . Retrognathia [M26.19] 07/10/2018  . Psychophysiological insomnia [F51.04] 07/10/2018  . Snoring [R06.83] 07/10/2018  . History of lupus ([place]) [M32.9] 01/02/2012  . CAD (coronary artery disease) [I25.10]   . Diabetes mellitus type 2, noninsulin dependent ([place]) [E11.9]   . Hypertensive heart disease without CHF [I11.9]   . Hyperlipidemia [E78.5]   . Obesity (BMI 30-39.9) [E66.9]   . GERD (gastroesophageal reflux disease) [K21.9]   . Osteoarthritis [M19.90]     Past Medical History Past Medical History:  Diagnosis Date  . Angina   . Bronchitis   . Diabetes mellitus   . Fibromyalgia   . GERD (gastroesophageal reflux disease)   . Headache(784.0)   . Hyperlipidemia   . Hypertension   . Hypertensive heart disease without CHF   . Lupus ([place])    "treated for it from 1992 til 2012; dr said I don't have it anymore"  . Obesity (BMI 30-39.9)   . Osteoarthritis   . Pneumonia   . Shortness of breath    lying down  . Shortness of breath on exertion     Past Surgical History Past Surgical History:  Procedure Laterality Date  . ABDOMINAL HYSTERECTOMY     partial  . CARDIAC CATHETERIZATION  ~ 2007  . CESAREAN SECTION  1978; 1981  . COLONOSCOPY    . FOOT SURGERY     "had to cut it to let the fluids out; it had swollen very badly; left foot"    Family History Family History  Problem Relation Age of Onset  . Heart attack Father   . Diabetes Father   . Hypertension Mother   . Heart attack Brother   . Kidney failure Brother   . Diabetes Sister   . Diabetes Sister      Social History  reports that she has  never smoked. She has never used smokeless tobacco. She reports that she does not drink alcohol or use drugs.  Medications  Current Outpatient Medications:  .  acetaminophen (TYLENOL) 500 MG tablet, Take 500 mg by mouth as needed for mild pain., Disp: , Rfl:  .  aspirin EC 81 MG tablet, Take 81 mg by mouth daily as needed for mild pain. , Disp: , Rfl:  .  clopidogrel (PLAVIX) 75 MG tablet, Take 1 tablet (75 mg total) by mouth daily., Disp: 30 tablet, Rfl: 11 .  empagliflozin (JARDIANCE) 10 MG TABS tablet, Take 10 mg by mouth daily., Disp: , Rfl:  .  KLOR-CON M20 20 MEQ tablet, Take 20 mEq by mouth  daily. , Disp: , Rfl: 0 .  nitroGLYCERIN (NITROSTAT) 0.4 MG SL tablet, Place 1 tablet (0.4 mg total) under the tongue every 5 (five) minutes as needed for chest pain., Disp: 25 tablet, Rfl: 12 .  pantoprazole (PROTONIX) 40 MG tablet, Take 1 tablet (40 mg total) by mouth daily at 12 noon., Disp: 30 tablet, Rfl: 12 .  rosuvastatin (CRESTOR) 20 MG tablet, Take 1 tablet (20 mg total) by mouth daily., Disp: 30 tablet, Rfl: 3 .  valsartan-hydrochlorothiazide (DIOVAN HCT) 160-12.5 MG tablet, Take 1 tablet by mouth daily., Disp: 30 tablet, Rfl: 2 .  amLODipine (NORVASC) 10 MG tablet, Take 10 mg by mouth daily., Disp: , Rfl:  .  Vitamin D, Ergocalciferol, (DRISDOL) 50000 units CAPS capsule, Take 50,000 Units by mouth every 7 (seven) days., Disp: , Rfl:   Allergies Sulfa antibiotics  Review of Systems Review of Systems - Oncology ROS negative   Physical Exam  Vitals Wt Readings from Last 3 Encounters:  12/18/18 170 lb 7 oz (77.3 kg)  12/15/18 169 lb 12.8 oz (77 kg)  11/27/18 165 lb (74.8 kg)   Temp Readings from Last 3 Encounters:  12/18/18 98.4 F (36.9 C) (Oral)  12/15/18 98.4 F (36.9 C) (Oral)  11/27/18 98.2 F (36.8 C) (Oral)   BP Readings from Last 3 Encounters:  12/18/18 (!) 168/88  12/15/18 (!) 165/93  11/27/18 (!) 169/92   Pulse Readings from Last 3 Encounters:  12/18/18 73   12/15/18 85  11/27/18 80   Constitutional: Well-developed, well-nourished, and in no distress.   HENT: Head: Normocephalic and atraumatic.  Mouth/Throat: No oropharyngeal exudate. Mucosa moist. Eyes: Pupils are equal, round, and reactive to light. Conjunctivae are normal. No scleral icterus.  Neck: Normal range of motion. Neck supple. No JVD present.  Cardiovascular: Normal rate, regular rhythm and normal heart sounds.  Exam reveals no gallop and no friction rub.   No murmur heard. Pulmonary/Chest: Effort normal and breath sounds normal. No respiratory distress. No wheezes.No rales.  Abdominal: Soft. Bowel sounds are normal. No distension. There is no tenderness. There is no guarding.  Musculoskeletal: No edema or tenderness.  Lymphadenopathy: No cervical, axillary or supraclavicular adenopathy.  Neurological: Alert and oriented to person, place, and time. No cranial nerve deficit.  Skin: Skin is warm and dry. No rash noted. No erythema. No pallor.  Psychiatric: Affect and judgment normal.   Labs No visits with results within 3 Day(s) from this visit.  Latest known visit with results is:  Appointment on 11/27/2018  Component Date Value Ref Range Status  . Kappa free light chain 11/27/2018 27.4* 3.3 - 19.4 mg/L Final  . Lamda free light chains 11/27/2018 16.1  5.7 - 26.3 mg/L Final  . Kappa, lamda light chain ratio 11/27/2018 1.70* 0.26 - 1.65 Final   Comment: (NOTE) Performed At: [facility] [address] [place], [place] [id number] Rush Farmer MD [id number]   . IgG (Immunoglobin G), Serum 11/27/2018 1,201  700 - 1,600 mg/dL Final  . IgA 11/27/2018 455* 87 - 352 mg/dL Final  . IgM (Immunoglobulin M), Srm 11/27/2018 27  26 - 217 mg/dL Final   Comment: (NOTE) Performed At: [facility] [place], [place] [id number] Rush Farmer MD [id number]   . Total Protein ELP 11/27/2018 7.1  6.0 - 8.5 g/dL Final  . Albumin ELP 11/27/2018 3.8  2.9 -  4.4 g/dL Final  . Alpha-1-Globulin 11/27/2018 0.3  0.0 - 0.4 g/dL Final  . Alpha-2-Globulin 11/27/2018 0.6  0.4 - 1.0 g/dL Final  . Beta Globulin 11/27/2018 1.3  0.7 - 1.3 g/dL Final  . Gamma Globulin 11/27/2018 1.1  0.4 - 1.8 g/dL Final  . M-Spike, % 11/27/2018 Not Observed  Not Observed g/dL Final  . SPE Interp. 11/27/2018 Comment   Final   Comment: (NOTE) The SPE pattern appears essentially unremarkable. Evidence of monoclonal protein is not apparent. Performed At: [facility] [place], [place] [id number] Rush Farmer MD [id number]   . Comment 11/27/2018 Comment   Final   Comment: (NOTE) Protein electrophoresis scan will follow via computer, mail, or courier delivery.   Marland Kitchen GLOBULIN, TOTAL 11/27/2018 3.3  2.2 - 3.9 g/dL Corrected  . A/G Ratio 11/27/2018 1.2  0.7 - 1.7 Corrected  . LDH 11/27/2018 216* 98 - 192 U/L Final   Performed at [facility], [place] [address], [place], [place] [address]  . Sodium 11/27/2018 143  135 - 145 mmol/L Final  . Potassium 11/27/2018 3.5  3.5 - 5.1 mmol/L Final  . Chloride 11/27/2018 103  98 - 111 mmol/L Final  . CO2 11/27/2018 29  22 - 32 mmol/L Final  . Glucose, Bld 11/27/2018 118* 70 - 99 mg/dL Final  . BUN 11/27/2018 13  8 - 23 mg/dL Final  . Creatinine, Ser 11/27/2018 1.00  0.44 - 1.00 mg/dL Final  . Calcium 11/27/2018 9.5  8.9 - 10.3 mg/dL Final  . Total Protein 11/27/2018 7.6  6.5 - 8.1 g/dL Final  . Albumin 11/27/2018 3.8  3.5 - 5.0 g/dL Final  . AST 11/27/2018 17  15 - 41 U/L Final  . ALT 11/27/2018 11  0 - 44 U/L Final  . Alkaline Phosphatase 11/27/2018 110  38 - 126 U/L Final  . Total Bilirubin 11/27/2018 1.3* 0.3 - 1.2 mg/dL Final  . GFR calc non Af Amer 11/27/2018 59* >60 mL/min Final  . GFR calc Af Amer 11/27/2018 >60  >60 mL/min Final  . Anion gap 11/27/2018 11  5 - 15 Final   Performed at [facility], [place] [address], [place], [place] [address]  . WBC  11/27/2018 4.7  4.0 - 10.5 K/uL Final  . RBC 11/27/2018 4.94  3.87 - 5.11 MIL/uL Final  . Hemoglobin 11/27/2018 14.3  12.0 - 15.0 g/dL Final  . HCT 11/27/2018 44.6  36.0 - 46.0 % Final  . MCV 11/27/2018 90.3  80.0 - 100.0 fL Final  . MCH 11/27/2018 28.9  26.0 - 34.0 pg Final  . MCHC 11/27/2018 32.1  30.0 - 36.0 g/dL Final  . RDW 11/27/2018 13.7  11.5 - 15.5 % Final  . Platelets 11/27/2018 269  150 - 400 K/uL Final  . nRBC 11/27/2018 0.0  0.0 - 0.2 % Final  . Neutrophils Relative % 11/27/2018 67  % Final  . Neutro Abs 11/27/2018 3.1  1.7 - 7.7 K/uL Final  . Lymphocytes Relative 11/27/2018 20  % Final  . Lymphs Abs 11/27/2018 0.9  0.7 - 4.0 K/uL Final  . Monocytes Relative 11/27/2018 9  % Final  . Monocytes Absolute 11/27/2018 0.4  0.1 - 1.0 K/uL Final  . Eosinophils Relative 11/27/2018 4  % Final  . Eosinophils Absolute 11/27/2018 0.2  0.0 - 0.5 K/uL Final  . Basophils Relative 11/27/2018 0  % Final  . Basophils Absolute 11/27/2018 0.0  0.0 - 0.1 K/uL Final  . Immature Granulocytes 11/27/2018 0  % Final  . Abs Immature Granulocytes 11/27/2018 0.01  0.00 -  0.07 K/uL Final   Performed at [facility], [place] [address] [place], [place] [address]     Pathology Orders Placed This Encounter  Procedures  . MM 3D SCREEN BREAST BILATERAL    Standing Status:   Future    Standing Expiration Date:   02/16/2020    Order Specific Question:   Reason for Exam (SYMPTOM  OR DIAGNOSIS REQUIRED)    Answer:   screening    Order Specific Question:   Preferred imaging location?    Answer:   [facility]  . CBC with Differential/Platelet    Standing Status:   Future    Standing Expiration Date:   12/19/2019  . Comprehensive metabolic panel    Standing Status:   Future    Standing Expiration Date:   12/19/2019  . Lactate dehydrogenase    Standing Status:   Future    Standing Expiration Date:   12/19/2019  . Protein electrophoresis, serum    Standing Status:   Future     Standing Expiration Date:   12/19/2019  . Kappa/lambda light chains    Standing Status:   Future    Standing Expiration Date:   12/19/2019  . IgG, IgA, IgM    Standing Status:   Future    Standing Expiration Date:   12/19/2019       Zoila Shutter MD

## 2018-12-18 NOTE — Telephone Encounter (Signed)
Printed calendar and avs.  Have to schedule appointment with provider when the template opens up.

## 2018-12-23 ENCOUNTER — Inpatient Hospital Stay: Admission: RE | Admit: 2018-12-23 | Payer: PPO | Source: Ambulatory Visit

## 2018-12-23 ENCOUNTER — Ambulatory Visit: Payer: PPO | Admitting: Podiatry

## 2018-12-23 DIAGNOSIS — E2839 Other primary ovarian failure: Secondary | ICD-10-CM | POA: Insufficient documentation

## 2018-12-23 DIAGNOSIS — F32A Depression, unspecified: Secondary | ICD-10-CM | POA: Insufficient documentation

## 2018-12-23 DIAGNOSIS — R799 Abnormal finding of blood chemistry, unspecified: Secondary | ICD-10-CM | POA: Insufficient documentation

## 2018-12-23 DIAGNOSIS — F329 Major depressive disorder, single episode, unspecified: Secondary | ICD-10-CM | POA: Insufficient documentation

## 2018-12-23 NOTE — Addendum Note (Signed)
Addended by: [name] F on: 12/23/2018 11:30 PM   Modules accepted: Level of Service

## 2018-12-25 ENCOUNTER — Inpatient Hospital Stay: Payer: PPO | Admitting: Internal Medicine

## 2018-12-30 ENCOUNTER — Other Ambulatory Visit: Payer: Self-pay | Admitting: Radiation Therapy

## 2018-12-30 DIAGNOSIS — F419 Anxiety disorder, unspecified: Secondary | ICD-10-CM | POA: Insufficient documentation

## 2018-12-31 ENCOUNTER — Other Ambulatory Visit: Payer: PPO

## 2019-01-02 ENCOUNTER — Telehealth: Payer: Self-pay | Admitting: Internal Medicine

## 2019-01-02 NOTE — Telephone Encounter (Signed)
Scheduled appt per 01/09 los.  Called patient and patient is aware of her appt.

## 2019-01-06 DIAGNOSIS — I200 Unstable angina: Secondary | ICD-10-CM | POA: Diagnosis not present

## 2019-01-06 DIAGNOSIS — E119 Type 2 diabetes mellitus without complications: Secondary | ICD-10-CM | POA: Diagnosis not present

## 2019-01-06 DIAGNOSIS — E669 Obesity, unspecified: Secondary | ICD-10-CM | POA: Diagnosis not present

## 2019-01-06 DIAGNOSIS — K219 Gastro-esophageal reflux disease without esophagitis: Secondary | ICD-10-CM | POA: Diagnosis not present

## 2019-01-06 DIAGNOSIS — R0609 Other forms of dyspnea: Secondary | ICD-10-CM | POA: Diagnosis not present

## 2019-01-06 DIAGNOSIS — I10 Essential (primary) hypertension: Secondary | ICD-10-CM | POA: Diagnosis not present

## 2019-01-07 ENCOUNTER — Other Ambulatory Visit (HOSPITAL_COMMUNITY): Payer: Self-pay | Admitting: Cardiology

## 2019-01-07 ENCOUNTER — Ambulatory Visit (INDEPENDENT_AMBULATORY_CARE_PROVIDER_SITE_OTHER): Payer: PPO | Admitting: Nurse Practitioner

## 2019-01-07 ENCOUNTER — Encounter: Payer: Self-pay | Admitting: Nurse Practitioner

## 2019-01-07 ENCOUNTER — Ambulatory Visit
Admission: RE | Admit: 2019-01-07 | Discharge: 2019-01-07 | Disposition: A | Discharge status: Home / Self Care | Payer: PPO | Source: Ambulatory Visit | Attending: Internal Medicine | Admitting: Internal Medicine

## 2019-01-07 ENCOUNTER — Other Ambulatory Visit: Payer: Self-pay | Admitting: Cardiology

## 2019-01-07 VITALS — BP 162/100 | HR 83 | Temp 98.4°F | Ht 65.2 in | Wt 175.6 lb

## 2019-01-07 DIAGNOSIS — G939 Disorder of brain, unspecified: Secondary | ICD-10-CM | POA: Diagnosis not present

## 2019-01-07 DIAGNOSIS — I119 Hypertensive heart disease without heart failure: Secondary | ICD-10-CM | POA: Diagnosis not present

## 2019-01-07 DIAGNOSIS — Z1231 Encounter for screening mammogram for malignant neoplasm of breast: Secondary | ICD-10-CM | POA: Diagnosis not present

## 2019-01-07 DIAGNOSIS — R079 Chest pain, unspecified: Secondary | ICD-10-CM

## 2019-01-07 DIAGNOSIS — S40022A Contusion of left upper arm, initial encounter: Secondary | ICD-10-CM | POA: Insufficient documentation

## 2019-01-07 DIAGNOSIS — D4989 Neoplasm of unspecified behavior of other specified sites: Secondary | ICD-10-CM

## 2019-01-07 DIAGNOSIS — T148XXA Other injury of unspecified body region, initial encounter: Secondary | ICD-10-CM

## 2019-01-07 DIAGNOSIS — Z9114 Patient's other noncompliance with medication regimen: Secondary | ICD-10-CM

## 2019-01-07 DIAGNOSIS — S40029A Contusion of unspecified upper arm, initial encounter: Secondary | ICD-10-CM | POA: Diagnosis not present

## 2019-01-07 DIAGNOSIS — Z1211 Encounter for screening for malignant neoplasm of colon: Secondary | ICD-10-CM | POA: Diagnosis not present

## 2019-01-07 DIAGNOSIS — I10 Essential (primary) hypertension: Secondary | ICD-10-CM

## 2019-01-07 MED ORDER — GADOBENATE DIMEGLUMINE 529 MG/ML IV SOLN
15.0000 mL | Freq: Once | INTRAVENOUS | Status: AC | PRN
  Administered 2019-01-07: 15 mL via INTRAVENOUS

## 2019-01-07 NOTE — Progress Notes (Addendum)
Subjective:     Patient ID: Bridget Good , female    DOB: [birth year] , 68 y.o.   MRN: [id number]   Chief Complaint  Patient presents with  . Hypertension    HPI  She has been without her valsartan/HCTZ for 2 weeks.  Seen by Dr. Terrence Dupont yesterday as well.   Hypertension  This is a chronic problem. The current episode started more than 1 year ago. The problem has been gradually worsening since onset. The problem is uncontrolled. Pertinent negatives include no anxiety, chest pain, headaches, palpitations or shortness of breath. There are no associated agents to hypertension. Risk factors for coronary artery disease include sedentary lifestyle and stress. Past treatments include beta blockers and diuretics. There are no compliance problems.  There is no history of kidney disease.     Past Medical History:  Diagnosis Date  . Angina   . Bronchitis   . Diabetes mellitus   . Fibromyalgia   . GERD (gastroesophageal reflux disease)   . Headache(784.0)   . Hyperlipidemia   . Hypertension   . Hypertensive heart disease without CHF   . Lupus ([place])    "treated for it from 1992 til 2012; dr said I don't have it anymore"  . Obesity (BMI 30-39.9)   . Osteoarthritis   . Pneumonia   . Shortness of breath    lying down  . Shortness of breath on exertion      Family History  Problem Relation Age of Onset  . Heart attack Father   . Diabetes Father   . Hypertension Mother   . Heart attack Brother   . Kidney failure Brother   . Diabetes Sister   . Diabetes Sister      Current Outpatient Medications:  .  acetaminophen (TYLENOL) 500 MG tablet, Take 500 mg by mouth as needed for mild pain., Disp: , Rfl:  .  amLODipine (NORVASC) 10 MG tablet, Take 10 mg by mouth daily., Disp: , Rfl:  .  aspirin EC 81 MG tablet, Take 81 mg by mouth daily as needed for mild pain. , Disp: , Rfl:  .  clopidogrel (PLAVIX) 75 MG tablet, Take 1 tablet (75 mg total) by mouth daily., Disp: 30 tablet, Rfl:  11 .  empagliflozin (JARDIANCE) 10 MG TABS tablet, Take 10 mg by mouth daily., Disp: , Rfl:  .  KLOR-CON M20 20 MEQ tablet, Take 20 mEq by mouth daily. , Disp: , Rfl: 0 .  nitroGLYCERIN (NITROSTAT) 0.4 MG SL tablet, Place 1 tablet (0.4 mg total) under the tongue every 5 (five) minutes as needed for chest pain., Disp: 25 tablet, Rfl: 12 .  pantoprazole (PROTONIX) 40 MG tablet, Take 1 tablet (40 mg total) by mouth daily at 12 noon., Disp: 30 tablet, Rfl: 12 .  rosuvastatin (CRESTOR) 20 MG tablet, Take 1 tablet (20 mg total) by mouth daily., Disp: 30 tablet, Rfl: 3 .  valsartan-hydrochlorothiazide (DIOVAN HCT) 160-12.5 MG tablet, Take 1 tablet by mouth daily., Disp: 30 tablet, Rfl: 2 .  Vitamin D, Ergocalciferol, (DRISDOL) 50000 units CAPS capsule, Take 50,000 Units by mouth every 7 (seven) days., Disp: , Rfl:    Allergies  Allergen Reactions  . Sulfa Antibiotics Hives, Itching and Nausea And Vomiting     Review of Systems  Constitutional: Negative.  Negative for fatigue.  Eyes: Negative for visual disturbance.  Respiratory: Negative.  Negative for shortness of breath.   Cardiovascular: Negative.  Negative for chest pain, palpitations and leg swelling.  Gastrointestinal:  Negative.   Endocrine: Negative.   Musculoskeletal: Negative.   Skin: Negative.   Neurological: Negative for dizziness, weakness and headaches.  Psychiatric/Behavioral: Negative for confusion. The patient is not nervous/anxious.      Today's Vitals   01/07/19 0854  BP: (!) 162/100  Pulse: 83  Temp: 98.4 F (36.9 C)  TempSrc: Oral  SpO2: 97%  Weight: 175 lb 9.6 oz (79.7 kg)  Height: 5' 5.2" (1.656 m)  PainSc: 0-No pain   Body mass index is 29.04 kg/m.   Objective:  Physical Exam Vitals signs reviewed.  Constitutional:      Appearance: She is well-developed.  HENT:     Head: Normocephalic and atraumatic.  Eyes:     Pupils: Pupils are equal, round, and reactive to light.  Cardiovascular:     Rate and  Rhythm: Normal rate and regular rhythm.     Pulses: Normal pulses.     Heart sounds: Normal heart sounds. No murmur.  Pulmonary:     Effort: Pulmonary effort is normal.     Breath sounds: Normal breath sounds.  Musculoskeletal: Normal range of motion.  Skin:    General: Skin is warm and dry.     Capillary Refill: Capillary refill takes less than 2 seconds.     Findings: Bruising (left arm with quarter sized bruise) present.  Neurological:     General: No focal deficit present.     Mental Status: She is alert and oriented to person, place, and time.     Cranial Nerves: No cranial nerve deficit.  Psychiatric:        Mood and Affect: Mood normal.         Assessment And Plan:     1. Hypertensive heart disease without CHF  Blood pressure continues to be elevated she has not been taking her medication regularly.    I have encouraged her to contact her insurance for mail order RX I feel this would be beneficial for her.    Called to [facility] and the valsartan is ready  I have also called her daughter to let her know of her upcoming appts. Given them a sticky note with the upcoming appts as well  2. Essential hypertension  See # 1  3. Bruising  Unknown how bruise to left arm  Left arm with quarter sized bruise  Will check CBC with Diff  - CBC with Diff  4. Screening mammogram, encounter for  Pt instructed on Self Breast Exam.According to ACOG guidelines Women aged 22 and older are recommended to get an annual mammogram. Form completed and given to patient contact the [facility] for appointment scheduing.   Pt encouraged to get annual mammogram  - MM Digital Screening; Future  5. Encounter for screening colonoscopy  According to USPTF Colorectal cancer Screening guidelines. Colonoscopy is recommended every 10 years, starting at age 55years.  Will refer to GI for colon cancer screening.  - Ambulatory referral to Gastroenterology  6. Arm bruise, left, initial  encounter  Unknown how bruise to left arm  Left arm with quarter sized bruise  Will check CBC with Diff  - CBC with Diff       [name], FNP

## 2019-01-08 ENCOUNTER — Encounter: Payer: Self-pay | Admitting: Internal Medicine

## 2019-01-08 ENCOUNTER — Inpatient Hospital Stay (HOSPITAL_BASED_OUTPATIENT_CLINIC_OR_DEPARTMENT_OTHER): Payer: PPO | Admitting: Internal Medicine

## 2019-01-08 VITALS — BP 198/115 | HR 90 | Temp 98.3°F | Resp 18 | Ht 65.2 in | Wt 176.3 lb

## 2019-01-08 DIAGNOSIS — R634 Abnormal weight loss: Secondary | ICD-10-CM | POA: Diagnosis not present

## 2019-01-08 DIAGNOSIS — G9389 Other specified disorders of brain: Secondary | ICD-10-CM | POA: Diagnosis not present

## 2019-01-08 DIAGNOSIS — R51 Headache: Secondary | ICD-10-CM | POA: Diagnosis not present

## 2019-01-08 DIAGNOSIS — G9689 Other specified disorders of central nervous system: Secondary | ICD-10-CM

## 2019-01-08 DIAGNOSIS — G968 Other specified disorders of central nervous system: Principal | ICD-10-CM

## 2019-01-08 NOTE — Progress Notes (Signed)
[facility] [place], [place] [address] [phone]   Interval Evaluation  Date of Service: 01/08/19 Patient Name: Bridget Good Patient MRN: [id number] Patient DOB: 02-16-1951 Provider: Ventura Sellers, MD  Identifying Statement:  Bridget Good is a 68 y.o. 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  She does describe persistent moderate headaches, near daily, frontal, without migrainous symptomatology.  She occasionally uses Tylenol for analgesia.  Otherwise no new or progressive neurologic deficits, no seizures.  Walking is normal and independent.  No further weight loss.    H+P (12/15/18) Patient presents after referral for abnormal brain MRI.  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  There were and are no symptoms clearly associated with the bony mass.  Referral was initially placed for Dr. Walden Field given abnormal light chains found on serology.  Per patient and Dr. Walden Field, further workup has not uncovered myeloma or other related malignancy to this point.  Skeletal survey was also performed and was normal.  Still, patient has not had CT scans of the body, and no CNS imaging in more than 5 months.  She maintains good functional status, walks with a cane because of orthopedic limitations.  Double vision has resolved.  Of related concern is that she describes greater than 100lbs unintentional weight loss over the past year.   Medications: Current Outpatient Medications on File Prior to Visit  Medication Sig  Dispense Refill  . acetaminophen (TYLENOL) 500 MG tablet Take 500 mg by mouth as needed for mild pain.    Marland Kitchen amLODipine (NORVASC) 10 MG tablet Take 10 mg by mouth daily.    Marland Kitchen aspirin EC 81 MG tablet Take 81 mg by mouth daily as needed for mild pain.     Marland Kitchen clopidogrel (PLAVIX) 75 MG tablet Take 1 tablet (75 mg total) by mouth daily. 30 tablet 11  . empagliflozin (JARDIANCE) 10 MG TABS tablet Take 10 mg by mouth daily.    Marland Kitchen KLOR-CON M20 20 MEQ tablet Take 20 mEq by mouth daily.   0  . nitroGLYCERIN (NITROSTAT) 0.4 MG SL tablet Place 1 tablet (0.4 mg total) under the tongue every 5 (five) minutes as needed for chest pain. 25 tablet 12  . pantoprazole (PROTONIX) 40 MG tablet Take 1 tablet (40 mg total) by mouth daily at 12 noon. 30 tablet 12  . rosuvastatin (CRESTOR) 20 MG tablet Take 1 tablet (20 mg total) by mouth daily. 30 tablet 3  . valsartan-hydrochlorothiazide (DIOVAN HCT) 160-12.5 MG tablet Take 1 tablet by mouth daily. 30 tablet 2  . Vitamin D, Ergocalciferol, (DRISDOL) 50000 units CAPS capsule Take 50,000 Units by mouth every 7 (seven) days.     No current facility-administered medications on file prior to visit.     Allergies:  Allergies  Allergen Reactions  . Sulfa Antibiotics Hives, Itching and Nausea And Vomiting   Past Medical History:  Past Medical History:  Diagnosis Date  . Angina   . Bronchitis   . Diabetes mellitus   . Fibromyalgia   . GERD (  gastroesophageal reflux disease)   . Headache(784.0)   . Hyperlipidemia   . Hypertension   . Hypertensive heart disease without CHF   . Lupus ([place])    "treated for it from 1992 til 2012; dr said I don't have it anymore"  . Obesity (BMI 30-39.9)   . Osteoarthritis   . Pneumonia   . Shortness of breath    lying down  . Shortness of breath on exertion    Past Surgical History:  Past Surgical History:  Procedure Laterality Date  . ABDOMINAL HYSTERECTOMY     partial  . CARDIAC CATHETERIZATION  ~ 2007  . CESAREAN SECTION   1978; 1981  . COLONOSCOPY    . FOOT SURGERY     "had to cut it to let the fluids out; it had swollen very badly; left foot"   Social History:  Social History   Socioeconomic History  . Marital status: Married    Spouse name: Not on file  . Number of children: Not on file  . Years of education: Not on file  . Highest education level: Not on file  Occupational History  . Not on file  Social Needs  . Financial resource strain: Not on file  . Food insecurity:    Worry: Not on file    Inability: Not on file  . Transportation needs:    Medical: Not on file    Non-medical: Not on file  Tobacco Use  . Smoking status: Never Smoker  . Smokeless tobacco: Never Used  Substance and Sexual Activity  . Alcohol use: No    Alcohol/week: 0.0 standard drinks  . Drug use: No  . Sexual activity: Yes    Partners: Male    Birth control/protection: Surgical  Lifestyle  . Physical activity:    Days per week: Not on file    Minutes per session: Not on file  . Stress: Not on file  Relationships  . Social connections:    Talks on phone: Not on file    Gets together: Not on file    Attends religious service: Not on file    Active member of club or organization: Not on file    Attends meetings of clubs or organizations: Not on file    Relationship status: Not on file  . Intimate partner violence:    Fear of current or ex partner: Not on file    Emotionally abused: Not on file    Physically abused: Not on file    Forced sexual activity: Not on file  Other Topics Concern  . Not on file  Social History Narrative   Married.  Husband is psychotherapist.  Several children.  Husband is Theme park manager of [employer] in [place].   Family History:  Family History  Problem Relation Age of Onset  . Heart attack Father   . Diabetes Father   . Hypertension Mother   . Heart attack Brother   . Kidney failure Brother   . Diabetes Sister   . Diabetes Sister     Review of Systems: Constitutional:  Denies fevers, chills or abnormal weight loss Eyes: Denies blurriness of vision Ears, nose, mouth, throat, and face: Denies mucositis or sore throat Respiratory: Denies cough, dyspnea or wheezes Cardiovascular: Denies palpitation, chest discomfort or lower extremity swelling Gastrointestinal:  Denies nausea, constipation, diarrhea GU: Denies dysuria or incontinence Skin: Denies abnormal skin rashes Neurological: Per HPI Musculoskeletal: joint pain Behavioral/Psych: +anxiety  Physical Exam: Vitals:   01/08/19 1159  BP: Marland Kitchen)  198/115  Pulse: 90  Resp: 18  Temp: 98.3 F (36.8 C)  SpO2: 100%   KPS: 80. General: Alert, cooperative, pleasant, in no acute distress Head: Normal EENT: No conjunctival injection or scleral icterus. Oral mucosa moist Lungs: Resp effort normal Cardiac: Regular rate and rhythm Abdomen: Soft, non-distended abdomen Skin: No rashes cyanosis or petechiae. Extremities: No clubbing or edema  Neurologic Exam: Mental Status: Awake, alert, attentive to examiner. Oriented to self and environment. Language is fluent with intact comprehension.  Cranial Nerves: Visual acuity is grossly normal. Visual fields are full. Extra-ocular movements intact. No ptosis. Face is symmetric, tongue midline. Motor: Tone and bulk are normal. Power is full in both arms and legs. Reflexes are symmetric, no pathologic reflexes present. Intact finger to nose bilaterally Sensory: Intact to light touch and temperature Gait: Normal and tandem gait is normal.   Labs: I have reviewed the data as listed    Component Value Date/Time   NA 143 11/27/2018 1055   NA 142 11/14/2018 1432   K 3.5 11/27/2018 1055   CL 103 11/27/2018 1055   CO2 29 11/27/2018 1055   GLUCOSE 118 (H) 11/27/2018 1055   BUN 13 11/27/2018 1055   BUN 13 11/14/2018 1432   CREATININE 1.00 11/27/2018 1055   CALCIUM 9.5 11/27/2018 1055   PROT 7.6 11/27/2018 1055   PROT 7.0 11/14/2018 1432   ALBUMIN 3.8 11/27/2018 1055     ALBUMIN 4.3 11/14/2018 1432   AST 17 11/27/2018 1055   ALT 11 11/27/2018 1055   ALKPHOS 110 11/27/2018 1055   BILITOT 1.3 (H) 11/27/2018 1055   BILITOT 0.8 11/14/2018 1432   GFRNONAA 59 (L) 11/27/2018 1055   GFRAA >60 11/27/2018 1055   Lab Results  Component Value Date   WBC 4.7 11/27/2018   NEUTROABS 3.1 11/27/2018   HGB 14.3 11/27/2018   HCT 44.6 11/27/2018   MCV 90.3 11/27/2018   PLT 269 11/27/2018   Imaging:  [facility] Clinician Interpretation: I have personally reviewed the CNS images as listed.  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 EXAM: CT CHEST, ABDOMEN, AND PELVIS WITH CONTRAST TECHNIQUE: Multidetector CT imaging of the chest, abdomen and pelvis was performed following the standard protocol during bolus administration of intravenous contrast. CONTRAST:  115mL ISOVUE-300 IOPAMIDOL (ISOVUE-300) INJECTION 61% COMPARISON:  CT abdomen and pelvis 12/30/2014 FINDINGS: CT CHEST FINDINGS Cardiovascular: Heart is normal size. Aorta is normal caliber. Scattered coronary artery and aortic calcifications. Mediastinum/Nodes: No mediastinal, hilar, or axillary adenopathy. Lungs/Pleura: Lungs are clear. No focal airspace opacities or suspicious nodules. No effusions. Musculoskeletal: No acute bony abnormality. CT ABDOMEN PELVIS FINDINGS Hepatobiliary: Scattered hypodensities in the liver are stable since 2016, most compatible with small cysts or hamartomas. No biliary ductal dilatation. Gallbladder unremarkable. Pancreas: No focal abnormality or ductal dilatation. Spleen: No focal abnormality.  Normal size. Adrenals/Urinary Tract: Numerous low-density lesions throughout the kidneys are stable since prior study most compatible with cysts. No evidence of enhancement within these cystic lesions. No hydronephrosis. Adrenal glands and urinary bladder unremarkable.  Stomach/Bowel: Diffuse colonic diverticulosis. No active diverticulitis. Vascular/Lymphatic: Aortic atherosclerosis. No enlarged abdominal or pelvic lymph nodes. Reproductive: Prior hysterectomy.  No adnexal masses. Other: No free fluid or free air. Musculoskeletal: No acute bony abnormality. IMPRESSION: No acute cardiopulmonary disease. Scattered coronary artery calcifications and aortic atherosclerosis. Scattered hypodensities in the liver are stable since 2016 and most  compatible with small cysts or hamartomas. Bilateral renal cysts are stable. Colonic diverticulosis.  No active diverticulitis. No acute findings in the abdomen or pelvis. Electronically Signed   By: Rolm Baptise M.D.   On: 12/18/2018 08:23   Mr Jeri Cos RX Contrast  Result Date: 01/07/2019 CLINICAL DATA:  68 y/o  F; increase headaches and frontal pain. EXAM: MRI HEAD WITHOUT AND WITH CONTRAST TECHNIQUE: Multiplanar, multiecho pulse sequences of the brain and surrounding structures were obtained without and with intravenous contrast. CONTRAST:  69mL MULTIHANCE GADOBENATE DIMEGLUMINE 529 MG/ML IV SOLN COMPARISON:  07/12/2018 and 05/26/2015 MRI of the head. FINDINGS: Brain: No acute infarction, hemorrhage, hydrocephalus, extra-axial collection or mass lesion. Small chronic lacunar infarcts are present within the pons, left thalamus, and right caudate head. Stable early confluent nonspecific T2 FLAIR hyperintensities in subcortical and periventricular white matter as well as the pons are compatible with moderate chronic microvascular ischemic changes. Stable mild volume loss of the brain. Few punctate foci of susceptibility hypointensity are present in right cerebellum as well as supratentorial brain compatible hemosiderin deposition of chronic microhemorrhage. Vascular: Normal flow voids. Skull and upper cervical spine: Enhancing lesion within the clivus is mildly increased in size from the prior MRI of the head with rightward and posterior  extension. The axial plane it measures approximately 18 x 24 mm, previously 18 x 19 mm measured in a similar fashion (series 12, image 29). No additional calvarial lesion is identified. Sinuses/Orbits: Negative. Other: None. IMPRESSION: 1. 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 Findings may represent metastasis, myeloma, or possibly invasive skull base tumor. Inflammatory process such as Paget's is also possible. 2. No acute intracranial abnormality. Stable chronic microvascular ischemic changes, volume loss, and small chronic infarcts of the brain. Electronically Signed   By: Kristine Garbe M.D.   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 EXAM: CT CHEST, ABDOMEN, AND PELVIS WITH CONTRAST TECHNIQUE: Multidetector CT imaging of the chest, abdomen and pelvis was performed following the standard protocol during bolus administration of intravenous contrast. CONTRAST:  111mL ISOVUE-300 IOPAMIDOL (ISOVUE-300) INJECTION 61% COMPARISON:  CT abdomen and pelvis 12/30/2014 FINDINGS: CT CHEST FINDINGS Cardiovascular: Heart is normal size. Aorta is normal caliber. Scattered coronary artery and aortic calcifications. Mediastinum/Nodes: No mediastinal, hilar, or axillary adenopathy. Lungs/Pleura: Lungs are clear. No focal airspace opacities or suspicious nodules. No effusions. Musculoskeletal: No acute bony abnormality. CT ABDOMEN PELVIS FINDINGS Hepatobiliary: Scattered hypodensities in the liver are stable since 2016, most compatible with small cysts or hamartomas. No biliary ductal dilatation. Gallbladder unremarkable. Pancreas: No focal abnormality or ductal dilatation. Spleen: No focal abnormality.  Normal size. Adrenals/Urinary Tract: Numerous low-density lesions throughout the kidneys are stable since prior study most compatible with cysts. No evidence of enhancement within these cystic lesions. No  hydronephrosis. Adrenal glands and urinary bladder unremarkable. Stomach/Bowel: Diffuse colonic diverticulosis. No active diverticulitis. Vascular/Lymphatic: Aortic atherosclerosis. No enlarged abdominal or pelvic lymph nodes. Reproductive: Prior hysterectomy.  No adnexal masses. Other: No free fluid or free air. Musculoskeletal: No acute bony abnormality. IMPRESSION: No acute cardiopulmonary disease. Scattered coronary artery calcifications and aortic atherosclerosis. Scattered hypodensities in the liver are stable since 2016 and most compatible with small cysts or hamartomas. Bilateral renal cysts are stable. Colonic diverticulosis.  No active diverticulitis. No acute findings in the abdomen or pelvis. Electronically Signed   By: Rolm Baptise M.D.   On: 12/18/2018 08:23  Assessment/Plan  1. CNS mass  Bridget Good is clinically stable today.  Her body CT's did not demonstrate a primary malignancy.  She has a mammogram still pending.  The clivus mass has grown subtly, and it's important to note that it was not present as of 2016 MRI scan.  This could represent a slow growing primary malignancy such as chordoma, or alternately an inflammatory process such as Paget's disease.      We will again review her case in Brain and Spine Tumor Board on 01/12/19.  Likely outcome will be referral to Dr. Zada Finders for diagnostic biopsy.  Acute headaches can be treated with Ibuprofen 800mg  (less than daily).  We appreciate the opportunity to participate in the Good of Bridget Good.   All questions were answered. The patient knows to call the clinic with any problems, questions or concerns. No barriers to learning were detected.  The total time spent in the encounter was 25 minutes and more than 50% was on counseling and review of test results   Ventura Sellers, MD Medical Director of Neuro-Oncology [facility] at [place] 01/08/19 11:50 AM

## 2019-01-12 ENCOUNTER — Telehealth: Payer: Self-pay

## 2019-01-12 NOTE — Telephone Encounter (Signed)
Per 1/30 no los 

## 2019-01-14 ENCOUNTER — Telehealth: Payer: Self-pay

## 2019-01-14 NOTE — Telephone Encounter (Signed)
Patient came in to see if she could be schedule for a f/u with [facility]. Per 2/5 walk in Left a voice msg for his RN

## 2019-01-14 NOTE — Telephone Encounter (Signed)
Error opening  

## 2019-01-15 ENCOUNTER — Telehealth: Payer: Self-pay | Admitting: Neurology

## 2019-01-15 ENCOUNTER — Institutional Professional Consult (permissible substitution): Payer: PPO | Admitting: Neurology

## 2019-01-15 NOTE — Telephone Encounter (Signed)
Called and spoke with the patient. Due to bad weather we are rescheduling her apt today until 3/2 at 2 pm. I will place pt on wait list if we have a cancellation and work her in sooner if needed. Pt verbalized understanding.

## 2019-01-21 ENCOUNTER — Ambulatory Visit (HOSPITAL_COMMUNITY)
Admission: RE | Admit: 2019-01-21 | Discharge: 2019-01-21 | Disposition: A | Discharge status: Home / Self Care | Payer: PPO | Source: Ambulatory Visit | Attending: Cardiology | Admitting: Cardiology

## 2019-01-21 DIAGNOSIS — R079 Chest pain, unspecified: Secondary | ICD-10-CM

## 2019-01-21 DIAGNOSIS — I200 Unstable angina: Secondary | ICD-10-CM | POA: Diagnosis not present

## 2019-01-21 DIAGNOSIS — R9431 Abnormal electrocardiogram [ECG] [EKG]: Secondary | ICD-10-CM | POA: Diagnosis not present

## 2019-01-21 DIAGNOSIS — I10 Essential (primary) hypertension: Secondary | ICD-10-CM | POA: Diagnosis not present

## 2019-01-21 DIAGNOSIS — E119 Type 2 diabetes mellitus without complications: Secondary | ICD-10-CM | POA: Diagnosis not present

## 2019-01-21 DIAGNOSIS — R0609 Other forms of dyspnea: Secondary | ICD-10-CM | POA: Diagnosis not present

## 2019-01-21 MED ORDER — TECHNETIUM TC 99M TETROFOSMIN IV KIT
30.0000 | PACK | Freq: Once | INTRAVENOUS | Status: AC | PRN
  Administered 2019-01-21: 30 via INTRAVENOUS

## 2019-01-21 MED ORDER — REGADENOSON 0.4 MG/5ML IV SOLN
INTRAVENOUS | Status: AC
  Filled 2019-01-21: qty 5

## 2019-01-21 MED ORDER — REGADENOSON 0.4 MG/5ML IV SOLN
0.4000 mg | Freq: Once | INTRAVENOUS | Status: AC
  Administered 2019-01-21: 0.4 mg via INTRAVENOUS

## 2019-01-21 MED ORDER — TECHNETIUM TC 99M TETROFOSMIN IV KIT
10.0000 | PACK | Freq: Once | INTRAVENOUS | Status: AC | PRN
  Administered 2019-01-21: 10 via INTRAVENOUS

## 2019-01-21 NOTE — Progress Notes (Signed)
Lexiscan complete. No complications noted. Patient given snack and beverage. Taken back to [facility] to complete test.

## 2019-01-22 ENCOUNTER — Telehealth: Payer: Self-pay | Admitting: Radiation Therapy

## 2019-01-22 NOTE — Telephone Encounter (Signed)
Called Dr. Colleen Can office requesting notes from the 2/6 visit to discuss surgical resection of the clival mass. Ms.Ketcham was a no show for this visit. I then left a message for Dr. Colleen Can scheduler requesting this appointment be rescheduled.    Mont Dutton R.T.(R)(T) Special Procedures Navigator

## 2019-01-28 DIAGNOSIS — G9389 Other specified disorders of brain: Secondary | ICD-10-CM | POA: Diagnosis not present

## 2019-01-30 ENCOUNTER — Ambulatory Visit
Admission: RE | Admit: 2019-01-30 | Discharge: 2019-01-30 | Disposition: A | Discharge status: Home / Self Care | Payer: PPO | Source: Ambulatory Visit | Attending: Internal Medicine | Admitting: Internal Medicine

## 2019-01-30 DIAGNOSIS — R899 Unspecified abnormal finding in specimens from other organs, systems and tissues: Secondary | ICD-10-CM

## 2019-01-30 DIAGNOSIS — Z1231 Encounter for screening mammogram for malignant neoplasm of breast: Secondary | ICD-10-CM | POA: Diagnosis not present

## 2019-02-01 ENCOUNTER — Encounter: Payer: Self-pay | Admitting: Nurse Practitioner

## 2019-02-02 ENCOUNTER — Other Ambulatory Visit: Payer: Self-pay | Admitting: Neurological Surgery

## 2019-02-02 DIAGNOSIS — I208 Other forms of angina pectoris: Secondary | ICD-10-CM | POA: Diagnosis not present

## 2019-02-02 DIAGNOSIS — E119 Type 2 diabetes mellitus without complications: Secondary | ICD-10-CM | POA: Diagnosis not present

## 2019-02-02 DIAGNOSIS — I10 Essential (primary) hypertension: Secondary | ICD-10-CM | POA: Diagnosis not present

## 2019-02-02 DIAGNOSIS — E785 Hyperlipidemia, unspecified: Secondary | ICD-10-CM | POA: Diagnosis not present

## 2019-02-04 ENCOUNTER — Ambulatory Visit (INDEPENDENT_AMBULATORY_CARE_PROVIDER_SITE_OTHER): Payer: PPO | Admitting: Nurse Practitioner

## 2019-02-04 ENCOUNTER — Ambulatory Visit: Payer: PPO | Admitting: Podiatry

## 2019-02-04 ENCOUNTER — Encounter: Payer: Self-pay | Admitting: Nurse Practitioner

## 2019-02-04 ENCOUNTER — Other Ambulatory Visit: Payer: Self-pay

## 2019-02-04 VITALS — BP 130/72 | HR 55 | Temp 98.7°F | Ht 62.0 in | Wt 176.6 lb

## 2019-02-04 DIAGNOSIS — E119 Type 2 diabetes mellitus without complications: Secondary | ICD-10-CM

## 2019-02-04 DIAGNOSIS — G968 Other specified disorders of central nervous system: Secondary | ICD-10-CM | POA: Diagnosis not present

## 2019-02-04 DIAGNOSIS — D496 Neoplasm of unspecified behavior of brain: Secondary | ICD-10-CM | POA: Insufficient documentation

## 2019-02-04 DIAGNOSIS — G9689 Other specified disorders of central nervous system: Secondary | ICD-10-CM

## 2019-02-04 DIAGNOSIS — I10 Essential (primary) hypertension: Secondary | ICD-10-CM | POA: Diagnosis not present

## 2019-02-04 NOTE — Patient Instructions (Signed)
   Great job on your blood pressure improvement, this is more normal

## 2019-02-04 NOTE — Progress Notes (Signed)
Subjective:     Patient ID: Bridget Good , female    DOB: 01-Feb-1951 , 68 y.o.   MRN: [id number]   Chief Complaint  Patient presents with  . Diabetes  . Hypertension  . Brain Tumor    going for ct scan tmrw    HPI  Diabetes  She presents for her follow-up diabetic visit. She has type 2 diabetes mellitus. Pertinent negatives for hypoglycemia include no confusion, dizziness or nervousness/anxiousness. Pertinent negatives for diabetes include no blurred vision, no chest pain, no fatigue, no polydipsia, no polyphagia and no polyuria. There are no hypoglycemic complications. Symptoms are stable. Diabetic current diet: she is trying to limit her sweet intake. She has not had a previous visit with a dietitian. She rarely participates in exercise. An ACE inhibitor/angiotensin II receptor blocker is being taken. Eye exam is not current.  Hypertension  This is a chronic problem. The current episode started more than 1 year ago. The problem has been gradually improving since onset. The problem is controlled. Pertinent negatives include no blurred vision, chest pain or palpitations. Past treatments include diuretics and angiotensin blockers. The current treatment provides no improvement. There are no compliance problems.  There is no history of chronic renal disease.     Past Medical History:  Diagnosis Date  . Angina   . Bronchitis   . Diabetes mellitus   . Fibromyalgia   . GERD (gastroesophageal reflux disease)   . Headache(784.0)   . Hyperlipidemia   . Hypertension   . Hypertensive heart disease without CHF   . Lupus ([place])    "treated for it from 1992 til 2012; dr said I don't have it anymore"  . Obesity (BMI 30-39.9)   . Osteoarthritis   . Pneumonia   . Shortness of breath    lying down  . Shortness of breath on exertion      Family History  Problem Relation Age of Onset  . Heart attack Father   . Diabetes Father   . Hypertension Mother   . Heart attack Brother   . Kidney  failure Brother   . Diabetes Sister   . Diabetes Sister      Current Outpatient Medications:  .  acetaminophen (TYLENOL) 500 MG tablet, Take 500 mg by mouth as needed for mild pain., Disp: , Rfl:  .  aspirin EC 81 MG tablet, Take 81 mg by mouth daily as needed for mild pain. , Disp: , Rfl:  .  clopidogrel (PLAVIX) 75 MG tablet, Take 1 tablet (75 mg total) by mouth daily., Disp: 30 tablet, Rfl: 11 .  empagliflozin (JARDIANCE) 10 MG TABS tablet, Take 10 mg by mouth daily., Disp: , Rfl:  .  Ginsengs-Royal Jelly-Vit B12 (GINSENG ROYAL JELLY PLUS PO), Take 1 capsule by mouth daily., Disp: , Rfl:  .  KLOR-CON M20 20 MEQ tablet, Take 20 mEq by mouth daily. , Disp: , Rfl: 0 .  nitroGLYCERIN (NITROSTAT) 0.4 MG SL tablet, Place 1 tablet (0.4 mg total) under the tongue every 5 (five) minutes as needed for chest pain. (Patient not taking: Reported on 01/08/2019), Disp: 25 tablet, Rfl: 12 .  pantoprazole (PROTONIX) 40 MG tablet, Take 1 tablet (40 mg total) by mouth daily at 12 noon., Disp: 30 tablet, Rfl: 12 .  rosuvastatin (CRESTOR) 20 MG tablet, Take 1 tablet (20 mg total) by mouth daily., Disp: 30 tablet, Rfl: 3 .  valsartan-hydrochlorothiazide (DIOVAN HCT) 160-12.5 MG tablet, Take 1 tablet by mouth daily. (Patient not taking:  Reported on 01/08/2019), Disp: 30 tablet, Rfl: 2 .  Vitamin D, Ergocalciferol, (DRISDOL) 50000 units CAPS capsule, Take 50,000 Units by mouth every 7 (seven) days., Disp: , Rfl:    Allergies  Allergen Reactions  . Sulfa Antibiotics Hives, Itching and Nausea And Vomiting     Review of Systems  Constitutional: Negative for fatigue.  Eyes: Negative for blurred vision.  Respiratory: Negative for cough.   Cardiovascular: Negative.  Negative for chest pain, palpitations and leg swelling.  Endocrine: Negative for polydipsia, polyphagia and polyuria.  Neurological: Negative for dizziness.  Psychiatric/Behavioral: Negative for confusion. The patient is not nervous/anxious.       Today's Vitals   02/04/19 0957  BP: 130/72  Pulse: (!) 55  Temp: 98.7 F (37.1 C)  TempSrc: Oral  SpO2: 98%  Weight: 176 lb 9.6 oz (80.1 kg)  Height: 5\' 2"  (1.575 m)   Body mass index is 32.3 kg/m.   Objective:  Physical Exam Constitutional:      Appearance: Normal appearance.  Cardiovascular:     Rate and Rhythm: Normal rate and regular rhythm.     Pulses: Normal pulses.     Heart sounds: Normal heart sounds. No murmur.  Pulmonary:     Effort: Pulmonary effort is normal. No respiratory distress.     Breath sounds: Normal breath sounds. No wheezing or rales.  Neurological:     Mental Status: She is alert.         Assessment And Plan:     1. Type 2 diabetes mellitus without complication, without long-term current use of insulin (HCC)  Chronic, fairly controlled  Continue with current medications  Continue checking blood sugar at least once per day  Encouraged to limit intake of sugary foods and drinks  Encouraged to increase physical activity to 150 minutes per week as tolerated  Referred to ophthalmology for her diabetic eye exam.   - Hemoglobin A1c - Ambulatory referral to Ophthalmology  2. Essential hypertension . B/P is better controlled, this is the best it has been in at least the last 6 months. . Congratulated her on doing better with her blood pressure control  . CMP ordered to check renal function.  - Hemoglobin A1c - CMP14 + Anion Gap  3. CNS mass  Concerning for clival tumor vs piaget's per Dr. Mickeal Skinner, she is scheduled for a "CT scan" per patient tomorrow.  I have encouraged her to be sure she goes to her follow up appts regularly so there can be effective interventions rather than waiting for long periods.     [name], FNP

## 2019-02-05 ENCOUNTER — Encounter: Payer: Self-pay | Admitting: Podiatry

## 2019-02-05 ENCOUNTER — Ambulatory Visit: Payer: PPO | Admitting: Podiatry

## 2019-02-05 VITALS — BP 137/87 | HR 50

## 2019-02-05 DIAGNOSIS — M79674 Pain in right toe(s): Secondary | ICD-10-CM | POA: Diagnosis not present

## 2019-02-05 DIAGNOSIS — M79675 Pain in left toe(s): Secondary | ICD-10-CM | POA: Diagnosis not present

## 2019-02-05 DIAGNOSIS — B353 Tinea pedis: Secondary | ICD-10-CM | POA: Diagnosis not present

## 2019-02-05 DIAGNOSIS — E119 Type 2 diabetes mellitus without complications: Secondary | ICD-10-CM

## 2019-02-05 DIAGNOSIS — Z9229 Personal history of other drug therapy: Secondary | ICD-10-CM | POA: Diagnosis not present

## 2019-02-05 DIAGNOSIS — B351 Tinea unguium: Secondary | ICD-10-CM

## 2019-02-05 MED ORDER — KETOCONAZOLE 2 % EX CREA: TOPICAL_CREAM | CUTANEOUS | 1 refills | Status: DC

## 2019-02-05 MED ORDER — CICLOPIROX 8 % EX SOLN: Freq: Every day | CUTANEOUS | 11 refills | Status: DC

## 2019-02-05 NOTE — Patient Instructions (Addendum)
Athlete's Foot  Athlete's foot (tinea pedis) is a fungal infection of the skin on your feet. It often occurs on the skin that is between or underneath the toes. It can also occur on the soles of your feet. The infection can spread from person to person (is contagious). It can also spread when a person's bare feet come in contact with the fungus on shower floors or on items such as shoes. What are the causes? This condition is caused by a fungus that grows in warm, moist places. You can get athlete's foot by sharing shoes, shower stalls, towels, and wet floors with someone who is infected. Not washing your feet or changing your socks often enough can also lead to athlete's foot. What increases the risk? This condition is more likely to develop in:  Men.  People who have a weak body defense system (immune system).  People who have diabetes.  People who use public showers, such as at a gym.  People who wear heavy-duty shoes, such as industrial or military shoes.  Seasons with warm, humid weather. What are the signs or symptoms? Symptoms of this condition include:  Itchy areas between your toes or on the soles of your feet.  White, flaky, or scaly areas between your toes or on the soles of your feet.  Very itchy small blisters between your toes or on the soles of your feet.  Small cuts in your skin. These cuts can become infected.  Thick or discolored toenails. How is this diagnosed? This condition may be diagnosed with a physical exam and a review of your medical history. Your health care provider may also take a skin or toenail sample to examine under a microscope. How is this treated? This condition is treated with antifungal medicines. These may be applied as powders, ointments, or creams. In severe cases, an oral antifungal medicine may be given. Follow these instructions at home: Medicines  Apply or take over-the-counter and prescription medicines only as told by your health  care provider.  Apply your antifungal medicine as told by your health care provider. Do not stop using the antifungal even if your condition improves. Foot care  Do not scratch your feet.  Keep your feet dry: ? Wear cotton or wool socks. Change your socks every day or if they become wet. ? Wear shoes that allow air to flow, such as sandals or canvas tennis shoes.  Wash and dry your feet, including the area between your toes. Also, wash and dry your feet: ? Every day or as told by your health care provider. ? After exercising. General instructions  Do not let others use towels, shoes, nail clippers, or other personal items that touch your feet.  Protect your feet by wearing sandals in wet areas, such as locker rooms and shared showers.  Keep all follow-up visits as told by your health care provider. This is important.  If you have diabetes, keep your blood sugar under control. Contact a health care provider if:  You have a fever.  You have swelling, soreness, warmth, or redness in your foot.  Your feet are not getting better with treatment.  Your symptoms get worse.  You have new symptoms. Summary  Athlete's foot (tinea pedis) is a fungal infection of the skin on your feet. It often occurs on skin that is between or underneath the toes.  This condition is caused by a fungus that grows in warm, moist places.  Symptoms include white, flaky, or scaly areas between   your toes or on the soles of your feet.  This condition is treated with antifungal medicines.  Keep your feet clean. Always dry them thoroughly. This information is not intended to replace advice given to you by your health care provider. Make sure you discuss any questions you have with your health care provider. Document Released: 11/23/2000 Document Revised: 09/16/2017 Document Reviewed: 09/16/2017 Elsevier Interactive Patient Education  2019 [employer] Inc.  Onychomycosis/Fungal Toenails  WHAT IS IT? An  infection that lies within the keratin of your nail plate that is caused by a fungus.  WHY ME? Fungal infections affect all ages, sexes, races, and creeds.  There may be many factors that predispose you to a fungal infection such as age, coexisting medical conditions such as diabetes, or an autoimmune disease; stress, medications, fatigue, genetics, etc.  Bottom line: fungus thrives in a warm, moist environment and your shoes offer such a location.  IS IT CONTAGIOUS? Theoretically, yes.  You do not want to share shoes, nail clippers or files with someone who has fungal toenails.  Walking around barefoot in the same room or sleeping in the same bed is unlikely to transfer the organism.  It is important to realize, however, that fungus can spread easily from one nail to the next on the same foot.  HOW DO WE TREAT THIS?  There are several ways to treat this condition.  Treatment may depend on many factors such as age, medications, pregnancy, liver and kidney conditions, etc.  It is best to ask your doctor which options are available to you.  1. No treatment.   Unlike many other medical concerns, you can live with this condition.  However for many people this can be a painful condition and may lead to ingrown toenails or a bacterial infection.  It is recommended that you keep the nails cut short to help reduce the amount of fungal nail. 2. Topical treatment.  These range from herbal remedies to prescription strength nail lacquers.  About 40-50% effective, topicals require twice daily application for approximately 9 to 12 months or until an entirely new nail has grown out.  The most effective topicals are medical grade medications available through physicians offices. 3. Oral antifungal medications.  With an 80-90% cure rate, the most common oral medication requires 3 to 4 months of therapy and stays in your system for a year as the new nail grows out.  Oral antifungal medications do require blood work to make  sure it is a safe drug for you.  A liver function panel will be performed prior to starting the medication and after the first month of treatment.  It is important to have the blood work performed to avoid any harmful side effects.  In general, this medication safe but blood work is required. 4. Laser Therapy.  This treatment is performed by applying a specialized laser to the affected nail plate.  This therapy is noninvasive, fast, and non-painful.  It is not covered by insurance and is therefore, out of pocket.  The results have been very good with a 80-95% cure rate.  [facility] is the only practice in the area to offer this therapy. 5. Permanent Nail Avulsion.  Removing the entire nail so that a new nail will not grow back. Diabetes Mellitus and Foot Care Foot care is an important part of your health, especially when you have diabetes. Diabetes may cause you to have problems because of poor blood flow (circulation) to your feet and  legs, which can cause your skin to:  Become thinner and drier.  Break more easily.  Heal more slowly.  Peel and crack. You may also have nerve damage (neuropathy) in your legs and feet, causing decreased feeling in them. This means that you may not notice minor injuries to your feet that could lead to more serious problems. Noticing and addressing any potential problems early is the best way to prevent future foot problems. How to care for your feet Foot hygiene  Wash your feet daily with warm water and mild soap. Do not use hot water. Then, pat your feet and the areas between your toes until they are completely dry. Do not soak your feet as this can dry your skin.  Trim your toenails straight across. Do not dig under them or around the cuticle. File the edges of your nails with an emery board or nail file.  Apply a moisturizing lotion or petroleum jelly to the skin on your feet and to dry, brittle toenails. Use lotion that does not contain alcohol and  is unscented. Do not apply lotion between your toes. Shoes and socks  Wear clean socks or stockings every day. Make sure they are not too tight. Do not wear knee-high stockings since they may decrease blood flow to your legs.  Wear shoes that fit properly and have enough cushioning. Always look in your shoes before you put them on to be sure there are no objects inside.  To break in new shoes, wear them for just a few hours a day. This prevents injuries on your feet. Wounds, scrapes, corns, and calluses  Check your feet daily for blisters, cuts, bruises, sores, and redness. If you cannot see the bottom of your feet, use a mirror or ask someone for help.  Do not cut corns or calluses or try to remove them with medicine.  If you find a minor scrape, cut, or break in the skin on your feet, keep it and the skin around it clean and dry. You may clean these areas with mild soap and water. Do not clean the area with peroxide, alcohol, or iodine.  If you have a wound, scrape, corn, or callus on your foot, look at it several times a day to make sure it is healing and not infected. Check for: ? Redness, swelling, or pain. ? Fluid or blood. ? Warmth. ? Pus or a bad smell. General instructions  Do not cross your legs. This may decrease blood flow to your feet.  Do not use heating pads or hot water bottles on your feet. They may burn your skin. If you have lost feeling in your feet or legs, you may not know this is happening until it is too late.  Protect your feet from hot and cold by wearing shoes, such as at the beach or on hot pavement.  Schedule a complete foot exam at least once a year (annually) or more often if you have foot problems. If you have foot problems, report any cuts, sores, or bruises to your health care provider immediately. Contact a health care provider if:  You have a medical condition that increases your risk of infection and you have any cuts, sores, or bruises on your  feet.  You have an injury that is not healing.  You have redness on your legs or feet.  You feel burning or tingling in your legs or feet.  You have pain or cramps in your legs and feet.  Your legs  or feet are numb.  Your feet always feel cold.  You have pain around a toenail. Get help right away if:  You have a wound, scrape, corn, or callus on your foot and: ? You have pain, swelling, or redness that gets worse. ? You have fluid or blood coming from the wound, scrape, corn, or callus. ? Your wound, scrape, corn, or callus feels warm to the touch. ? You have pus or a bad smell coming from the wound, scrape, corn, or callus. ? You have a fever. ? You have a red line going up your leg. Summary  Check your feet every day for cuts, sores, red spots, swelling, and blisters.  Moisturize feet and legs daily.  Wear shoes that fit properly and have enough cushioning.  If you have foot problems, report any cuts, sores, or bruises to your health care provider immediately.  Schedule a complete foot exam at least once a year (annually) or more often if you have foot problems. This information is not intended to replace advice given to you by your health care provider. Make sure you discuss any questions you have with your health care provider. Document Released: 11/23/2000 Document Revised: 01/08/2018 Document Reviewed: 12/28/2016 Elsevier Interactive Patient Education  2019 [employer].

## 2019-02-09 ENCOUNTER — Encounter: Payer: Self-pay | Admitting: Neurology

## 2019-02-09 ENCOUNTER — Ambulatory Visit (INDEPENDENT_AMBULATORY_CARE_PROVIDER_SITE_OTHER): Payer: PPO | Admitting: Neurology

## 2019-02-09 ENCOUNTER — Ambulatory Visit: Payer: Self-pay | Admitting: Neurology

## 2019-02-09 VITALS — BP 176/99 | HR 65 | Ht 63.0 in | Wt 182.0 lb

## 2019-02-09 DIAGNOSIS — F418 Other specified anxiety disorders: Secondary | ICD-10-CM | POA: Diagnosis not present

## 2019-02-09 DIAGNOSIS — F5104 Psychophysiologic insomnia: Secondary | ICD-10-CM

## 2019-02-09 DIAGNOSIS — R4589 Other symptoms and signs involving emotional state: Secondary | ICD-10-CM | POA: Insufficient documentation

## 2019-02-09 MED ORDER — SERTRALINE HCL 25 MG PO TABS: 25.0000 mg | ORAL_TABLET | Freq: Every day | ORAL | 5 refills | Status: DC

## 2019-02-09 MED ORDER — TRAZODONE HCL 50 MG PO TABS: 50.0000 mg | ORAL_TABLET | Freq: Every evening | ORAL | 2 refills | Status: DC | PRN

## 2019-02-09 NOTE — Progress Notes (Signed)
SLEEP MEDICINE CLINIC   Provider:  Larey Seat, MD   Primary Care Physician:  [name], FNP Referring Provider: [name], FNP   Chief Complaint  Patient presents with  . New Patient (Initial Visit)    pt with husband,rm 10.  can't sleep well at night, noted more memory problems and feels irritable from not sleeping. She snores and wakes up gasping for air.     HPI:  Bridget Good is a 68 y.o. female patient, seen today on follow up.  She was last seen in 2019 for a sleep evaluation. She has had a a turbulent year. Patient has been seen by oncology/ hematology. She has a stroke in 07-12-2018. She was at home in bed, got up to the bathroom and had diplopia. She went to hospital and was diagnosed with a stroke.  As I had explained last year there were a lot of social and psychological pressures on the Bridget Good, and I had felt that her sleep may have partially been impaired by this.  However she had seen the oncologist Dr. Zoila Shutter, who stated on 11/27/2018 that the patient had a bone lesion noted on an MRI in August 2019 with a stroke history about being on Plavix.  An SPEP was negative, the infarct of the abnormal marrow signal of the clivus was compared to a study from 2016 and not found.  The oncologist worked her up for possible  myeloma but did not find evidence for this. Oncology also set the patient up for skeletal survey which has not been yet done.  She also wanted to refer her again to neurology for evaluation and follow-up, but there is no need for stroke follow-up at this time.  The patient already had an evaluation in the sleep clinic. She was then seen by oncologist Cecil Cobbs at the time presented with very high blood pressures 198/115 in summary Dr. Mickeal Skinner: referral to Dr. Venetia Constable for Biopsy.   IMPRESSION: 1. 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 Findings may represent metastasis, myeloma, or  possibly invasive skull base tumor. Inflammatory process such as Paget's is also possible. 2. No acute intracranial abnormality. Stable chronic microvascular ischemic changes, volume loss, and small chronic infarcts of the brain.   Electronically Signed   By: Kristine Garbe M.D.   On: 01/07/2019 22:55   She was later referred to me by PCP and Dr. Jaynee Eagles, MD for a sleep study after stroke.:  IMPRESSION of her sleep study :  1. No clinically significant degree of Obstructive Sleep Apnea (OSA), Periodic Limb Movement Disorder (PLMD) and only mild- moderate Snoring. No hypoxemia.  2. Normal EKG rhythm with some sinus bradycardia ( not on beta blocker).  3. Over 6 hours of sleep time without fragmentation.  4. Early REM onset at less than 30 minutes was unusual. (There are no other symptoms of narcolepsy endorsed, and Epworth score is less than 14 / 24, fatigue is not high).  RECOMMENDATIONS: 1. I advise to avoid supine sleep as snoring was louder in that position. 2. Your sleep is not fragmented - at least not in the sleep lab. Are there outside stimuli at home that may cause difficulties to go to sleep that weren't present here?   You fell asleep before 22.45.   Larey Seat, MD     08-04-2018     Chief Complaint  Patient presents with  . New Patient (Initial Visit)    pt with  husband,rm 10.  can't sleep well at night, noted more memory problems and feels irritable from not sleeping. She snores and wakes up gasping for air.     Bridget Good is a 68 year old right-handed African-American female patient seen here in the presence of her husband.  She used to see Dr. Romana Juniper for many years until he retired and has now established primary care with tried another medicine.  Her past medical history is positive for hypertension, hyperlipidemia and diabetes mellitus.  She also has complaints about anxiety headaches and recently developed less restorative sleep and  problems with insomnia. She feels under a lot of stress. Her husband is retired- the Good now runs a Animal nutritionist business. Her husband had resigned after 25 years from a church, after a conflict with the leadership.    Sleep habits are as follows:  Dinner time is around 6 -7 Pm, and working through books and fianances of there company, often watching TV. Sometimes she sleeps on the sofa.   Bed time is around 11.30 after the late news. She takes night time medication- Tylenol or ibuprofen.  The bedroom is relatively quiet, dark and cool. She sleeps on her back and on 2 pillows, she snores, intermittently. Nocturia once at the most.  Cannot recall dreaming at all. She wakes spontaneously between 7 and 8 and rises. Her knee is stiff.  Some morning she wakes with headaches, not from headaches.  Rare naps. Less than 7 hours of nocturnal sleep.    Sleep medical history and family sleep history: NP moore has ordered a rheumatological panel, RA factor was positive at 15.6 u/ml. Vit D deficiency. Chol 211, creatinine above 1.    Social history:  Non smoker, non drinker, caffeine use - iced tea and sodas- up to 2 glasses a day.   Review of Systems: Out of a complete 14 system review, the patient complains of only the following symptoms, and all other reviewed systems are negative.  Insomnia, snoring.   Epworth score  12/ 24  , Fatigue severity score 29/ 63   , depression score N/a    Social History   Socioeconomic History  . Marital status: Married    Spouse name: Not on file  . Number of children: Not on file  . Years of education: Not on file  . Highest education level: Not on file  Occupational History  . Not on file  Social Needs  . Financial resource strain: Not on file  . Food insecurity:    Worry: Not on file    Inability: Not on file  . Transportation needs:    Medical: Not on file    Non-medical: Not on file  Tobacco Use  . Smoking status: Never Smoker  . Smokeless tobacco:  Never Used  Substance and Sexual Activity  . Alcohol use: No    Alcohol/week: 0.0 standard drinks  . Drug use: No  . Sexual activity: Yes    Partners: Male    Birth control/protection: Surgical  Lifestyle  . Physical activity:    Days per week: Not on file    Minutes per session: Not on file  . Stress: Not on file  Relationships  . Social connections:    Talks on phone: Not on file    Gets together: Not on file    Attends religious service: Not on file    Active member of club or organization: Not on file    Attends meetings of clubs or organizations: Not  on file    Relationship status: Not on file  . Intimate partner violence:    Fear of current or ex partner: Not on file    Emotionally abused: Not on file    Physically abused: Not on file    Forced sexual activity: Not on file  Other Topics Concern  . Not on file  Social History Narrative   Married.  Husband is psychotherapist.  Several children.  Husband is Theme park manager of [employer] in [place].    Family History  Problem Relation Age of Onset  . Heart attack Father   . Diabetes Father   . Hypertension Mother   . Heart attack Brother   . Kidney failure Brother   . Diabetes Sister   . Diabetes Sister     Past Medical History:  Diagnosis Date  . Angina   . Bronchitis   . Diabetes mellitus   . Fibromyalgia   . GERD (gastroesophageal reflux disease)   . Headache(784.0)   . Hyperlipidemia   . Hypertension   . Hypertensive heart disease without CHF   . Lupus ([place])    "treated for it from 1992 til 2012; dr said I don't have it anymore"  . Obesity (BMI 30-39.9)   . Osteoarthritis   . Pneumonia   . Shortness of breath    lying down  . Shortness of breath on exertion     Past Surgical History:  Procedure Laterality Date  . ABDOMINAL HYSTERECTOMY     partial  . CARDIAC CATHETERIZATION  ~ 2007  . CESAREAN SECTION  1978; 1981  . COLONOSCOPY    . FOOT SURGERY     "had to cut it to let the fluids out; it had  swollen very badly; left foot"    Current Outpatient Medications  Medication Sig Dispense Refill  . acetaminophen (TYLENOL) 500 MG tablet Take 500 mg by mouth as needed for mild pain.    Marland Kitchen aspirin EC 81 MG tablet Take 81 mg by mouth daily as needed for mild pain.     . ciclopirox (PENLAC) 8 % solution Apply topically at bedtime. Apply over nail and surrounding skin. Apply daily over previous coat. Remove weekly with polish remover. 6.6 mL 11  . clopidogrel (PLAVIX) 75 MG tablet Take 1 tablet (75 mg total) by mouth daily. 30 tablet 11  . empagliflozin (JARDIANCE) 10 MG TABS tablet Take 10 mg by mouth daily.    [name] Jelly-Vit B12 (GINSENG ROYAL JELLY PLUS PO) Take 1 capsule by mouth daily.    Marland Kitchen ketoconazole (NIZORAL) 2 % cream Apply to both feet and between toes once daily for 6 weeks 30 g 1  . KLOR-CON M20 20 MEQ tablet Take 20 mEq by mouth daily.   0  . nitroGLYCERIN (NITROSTAT) 0.4 MG SL tablet Place 1 tablet (0.4 mg total) under the tongue every 5 (five) minutes as needed for chest pain. 25 tablet 12  . pantoprazole (PROTONIX) 40 MG tablet Take 1 tablet (40 mg total) by mouth daily at 12 noon. 30 tablet 12  . rosuvastatin (CRESTOR) 20 MG tablet Take 1 tablet (20 mg total) by mouth daily. 30 tablet 3  . valsartan-hydrochlorothiazide (DIOVAN HCT) 160-12.5 MG tablet Take 1 tablet by mouth daily. 30 tablet 2  . Vitamin D, Ergocalciferol, (DRISDOL) 50000 units CAPS capsule Take 50,000 Units by mouth every 7 (seven) days.     No current facility-administered medications for this visit.     Allergies as of 02/09/2019 -  Review Complete 02/09/2019  Allergen Reaction Noted  . Sulfa antibiotics Hives, Itching, and Nausea And Vomiting 12/30/2011    Vitals: BP (!) 176/99   Pulse 65   Ht 5\' 3"  (1.6 m)   Wt 182 lb (82.6 kg)   BMI 32.24 kg/m  Last Weight:  Wt Readings from Last 1 Encounters:  02/09/19 182 lb (82.6 kg)   HAL:PFXT mass index is 32.24 kg/m.     Last Height:   Ht  Readings from Last 1 Encounters:  02/09/19 5\' 3"  (1.6 m)    Physical exam:  General: The patient is awake, alert and appears not in acute distress. The patient is well groomed. Head: Normocephalic, atraumatic. Neck is supple. Mallampati 4,  neck circumference:14. Nasal airflow patent ,Retrognathia is strongly seen.  Cardiovascular:  Regular rate and rhythm, without  murmurs or carotid bruit, and without distended neck veins. Respiratory: Lungs are clear to auscultation. Skin:  Without evidence of edema, or rash Trunk: BMI is 28.3. The patient's posture is erect   Neurologic exam : The patient is awake and alert, oriented to place and time.   Memory subjective described as intact.  Attention span & concentration ability appears normal.  Speech is fluent, without dysarthria, dysphonia or aphasia.  Mood and affect are worried. .  Cranial nerves: Pupils are equal and briskly reactive to light.  Funduscopic exam without evidence of pallor or edema.  Extraocular movements  in vertical and horizontal planes intact and without nystagmus. Visual fields by finger perimetry are intact. Hearing to finger rub intact. Facial sensation intact to fine touch.  Facial motor strength is symmetric , the tongue and uvula move midline. Shoulder shrug was symmetrical.   Motor exam:  Normal tone, muscle bulk and symmetric strength in all extremities. Weak grip.  Sensory:  Fine touch, pinprick and vibration were tested in all extremities. Proprioception tested in the upper extremities was normal. Coordination: handwriting changes. Finger-to-nose maneuver  normal without evidence of ataxia, dysmetria or tremor. Gait and station: Patient walks without assistive device and is able unassisted to climb up to the exam table. Strength within normal limits.  Stance is stable and normal. Turns with 3 Steps.   Deep tendon reflexes: in the upper and lower extremities are symmetric and intact.  Assessment:  After  physical and neurologic examination, review of laboratory studies,  Personal review of imaging studies, reports of other /same  Imaging studies, results of polysomnography and / or neurophysiology testing and pre-existing records as far as provided in visit., my assessment is   1) insomnia can be stress related - medical issues, worried about a possible cancer, etc. anxiety-  About the findings on MRI brain.  2) snoring- and hypersomnia- consider a dental device.  3) no follow up for stroke needed.   The patient was advised of the nature of the diagnosed anxiety disorder , the treatment options for insomnia and headaches.  I spent more than 35 minutes of face to face time with the patient.  Greater than 50% of time was spent in counseling and coordination of care. We have discussed the diagnosis and differential and I answered the patient's questions.    Plan:  Treatment plan and additional workup :  I would strongly recommend anxiety/ depression treatment. Trial of SSRI. Zoloft.   I would, like to receive results of clivus/ brain biopsy, Dr. Venetia Constable. Chordoma?   Recommend melatonin 5 mg or less.  Prn trazodone.     Larey Seat, MD 02/09/2019, 2:17  PM  Certified in Neurology by ABPN Certified in [place] by [facility] [address], [place], [place] [address]

## 2019-02-09 NOTE — Patient Instructions (Signed)

## 2019-02-13 ENCOUNTER — Ambulatory Visit: Payer: PPO | Admitting: Nurse Practitioner

## 2019-02-14 ENCOUNTER — Encounter: Payer: Self-pay | Admitting: Podiatry

## 2019-02-14 ENCOUNTER — Encounter: Payer: Self-pay | Admitting: Nurse Practitioner

## 2019-02-14 NOTE — Progress Notes (Signed)
Subjective: Bridget Good presents today referred by [name], FNP with 20 year h/o diabetes.  She has cc of painful, discolored, thick toenails which interfere with daily activities.  Pain is aggravated when wearing enclosed shoe gear. Pain is relieved with periodic professional debridement.  Ms. Palazzi has numbness in both feet which comes and goes. She also relates cramping in toes. She denies any h/o foot wounds.  She denies cramping in calves, thighs or buttocks.   [name], FNP, is her PCP and last visit was 02/04/2019.  Past Medical History:  Diagnosis Date  . Angina   . Bronchitis   . Diabetes mellitus   . Fibromyalgia   . GERD (gastroesophageal reflux disease)   . Headache(784.0)   . Hyperlipidemia   . Hypertension   . Hypertensive heart disease without CHF   . Lupus ([place])    "treated for it from 1992 til 2012; dr said I don't have it anymore"  . Obesity (BMI 30-39.9)   . Osteoarthritis   . Pneumonia   . Shortness of breath    lying down  . Shortness of breath on exertion     Patient Active Problem List   Diagnosis Date Noted  . Anxiety about health 02/09/2019  . Brain tumor ([place]) 02/04/2019  . Bruising 01/07/2019  . Anxiety 12/30/2018  . Decreased estrogen level 12/23/2018  . Abnormal blood chemistry 12/23/2018  . Depression 12/23/2018  . CNS mass 12/15/2018  . Stroke ([place]) 12/15/2018  . Hypertensive retinopathy 12/08/2018  . Palpitations 07/16/2018  . Cerebral thrombosis with cerebral infarction 07/12/2018  . Chest pain 07/11/2018  . Abnormal EKG 07/11/2018  . AKI (acute kidney injury) ([place]) 07/11/2018  . Essential hypertension   . Retrognathia 07/10/2018  . Psychophysiological insomnia 07/10/2018  . Snoring 07/10/2018  . History of lupus ([place]) 01/02/2012  . CAD (coronary artery disease)   . Type 2 diabetes mellitus without complication, without long-term current use of insulin ([place])   . Hypertensive heart disease without CHF   .  Hyperlipidemia   . Obesity (BMI 30-39.9)   . GERD (gastroesophageal reflux disease)   . Osteoarthritis     Past Surgical History:  Procedure Laterality Date  . ABDOMINAL HYSTERECTOMY     partial  . CARDIAC CATHETERIZATION  ~ 2007  . CESAREAN SECTION  1978; 1981  . COLONOSCOPY    . FOOT SURGERY     "had to cut it to let the fluids out; it had swollen very badly; left foot"     Current Outpatient Medications:  .  acetaminophen (TYLENOL) 500 MG tablet, Take 500 mg by mouth as needed for mild pain., Disp: , Rfl:  .  aspirin EC 81 MG tablet, Take 81 mg by mouth daily as needed for mild pain. , Disp: , Rfl:  .  ciclopirox (PENLAC) 8 % solution, Apply topically at bedtime. Apply over nail and surrounding skin. Apply daily over previous coat. Remove weekly with polish remover., Disp: 6.6 mL, Rfl: 11 .  clopidogrel (PLAVIX) 75 MG tablet, Take 1 tablet (75 mg total) by mouth daily., Disp: 30 tablet, Rfl: 11 .  empagliflozin (JARDIANCE) 10 MG TABS tablet, Take 10 mg by mouth daily., Disp: , Rfl:  .  Ginsengs-Royal Jelly-Vit B12 (GINSENG ROYAL JELLY PLUS PO), Take 1 capsule by mouth daily., Disp: , Rfl:  .  ketoconazole (NIZORAL) 2 % cream, Apply to both feet and between toes once daily for 6 weeks, Disp: 30 g, Rfl: 1 .  KLOR-CON M20  20 MEQ tablet, Take 20 mEq by mouth daily. , Disp: , Rfl: 0 .  nitroGLYCERIN (NITROSTAT) 0.4 MG SL tablet, Place 1 tablet (0.4 mg total) under the tongue every 5 (five) minutes as needed for chest pain., Disp: 25 tablet, Rfl: 12 .  pantoprazole (PROTONIX) 40 MG tablet, Take 1 tablet (40 mg total) by mouth daily at 12 noon., Disp: 30 tablet, Rfl: 12 .  rosuvastatin (CRESTOR) 20 MG tablet, Take 1 tablet (20 mg total) by mouth daily., Disp: 30 tablet, Rfl: 3 .  sertraline (ZOLOFT) 25 MG tablet, Take 1 tablet (25 mg total) by mouth at bedtime., Disp: 30 tablet, Rfl: 5 .  traZODone (DESYREL) 50 MG tablet, Take 1 tablet (50 mg total) by mouth at bedtime as needed for  sleep., Disp: 30 tablet, Rfl: 2 .  valsartan-hydrochlorothiazide (DIOVAN HCT) 160-12.5 MG tablet, Take 1 tablet by mouth daily., Disp: 30 tablet, Rfl: 2 .  Vitamin D, Ergocalciferol, (DRISDOL) 50000 units CAPS capsule, Take 50,000 Units by mouth every 7 (seven) days., Disp: , Rfl:   Allergies  Allergen Reactions  . Sulfa Antibiotics Hives, Itching and Nausea And Vomiting    Social History   Occupational History  . Not on file  Tobacco Use  . Smoking status: Never Smoker  . Smokeless tobacco: Never Used  Substance and Sexual Activity  . Alcohol use: No    Alcohol/week: 0.0 standard drinks  . Drug use: No  . Sexual activity: Yes    Partners: Male    Birth control/protection: Surgical    Family History  Problem Relation Age of Onset  . Heart attack Father   . Diabetes Father   . Hypertension Mother   . Heart attack Brother   . Kidney failure Brother   . Diabetes Sister   . Diabetes Sister     Immunization History  Administered Date(s) Administered  . Influenza Inj Mdck Quad Pf 01/02/2018  . Influenza, High Dose Seasonal PF 09/19/2018  . Influenza,inj,Quad PF,6+ Mos 12/06/2016  . Pneumococcal Polysaccharide-23 11/26/2018     Review of systems: Positive Findings in bold print.  Constitutional:  chills, fatigue, fever, sweats, weight change Communication: Optometrist, sign Ecologist, hand writing, iPad/Android device Head: headaches, head injury Eyes: changes in vision, eye pain, glaucoma, cataracts, macular degeneration, diplopia, glare,  light sensitivity, eyeglasses or contacts, blindness Ears nose mouth throat: Hard of hearing, ringing in ears, deaf, sign language,  vertigo,   nosebleeds,  rhinitis,  cold sores, snoring, swollen glands Cardiovascular: HTN, edema, arrhythmia, pacemaker in place, defibrillator in place, chest pain/tightness, chronic anticoagulation, blood clot, heart failure Peripheral Vascular: leg cramps, varicose veins, blood clots,  lymphedema Respiratory:  difficulty breathing, denies congestion, SOB, wheezing, cough, emphysema Gastrointestinal: change in appetite or weight, abdominal pain, constipation, diarrhea, nausea, vomiting, vomiting blood, change in bowel habits, abdominal pain, jaundice, rectal bleeding, hemorrhoids, Genitourinary:  nocturia,  pain on urination,  blood in urine, Foley catheter, urinary urgency Musculoskeletal: uses mobility aid,  cramping, stiff joints, painful joints, decreased joint motion, fractures, OA, gout Skin: +changes in toenails, color change, dryness, itching, mole changes,  rash  Neurological: headaches, numbness in feet, paresthesias in feet, burning in feet, fainting,  seizures, change in speech.  headaches, memory problems/poor historian, cerebral palsy, weakness, paralysis, CVA Endocrine: diabetes, hypothyroidism, hyperthyroidism,  goiter, dry mouth, flushing, heat intolerance,  cold intolerance,  excessive thirst, denies polyuria,  nocturia Hematological:  easy bleeding, excessive bleeding, easy bruising, enlarged lymph nodes, on long term blood thinner, history of past  transusions Allergy/immunological:  hives, eczema, frequent infections, multiple drug allergies, seasonal allergies, transplant recipient Psychiatric:  anxiety, depression, mood disorder, suicidal ideations, hallucinations   Objective: Vascular Examination: Capillary refill time immediate x 10 digits.  Dorsalis pedis and posterior tibial pulses present b/l.  No digital hair x 10 digits.  Skin temperature gradient WNL b/l.  Dermatological Examination: Skin with normal turgor, texture and tone b/l.  Toenails 1-5 right foot discolored, thick, dystrophic with subungual debris and pain with palpation to nailbeds due to thickness of nails. Remaining nails 1-5 left nondystrophic.   Musculoskeletal: Muscle strength 5/5 to all LE muscle groups  Neurological: Sensation intact with 10 gram monofilament.  Vibratory  sensation intact.  Assessment: 1. Painful onychomycosis toenails 1-5 right foot 2. Nondystrophic nails left foot 3. NIDDM  Plan: 1. Discussed diabetic foot care principles. Literature dispensed on today. 2. Toenails 1-5 right were debrided in length and girth and nails 1-5 left foot were trimmed without iatrogenic bleeding. Rx written for Penlac Nail Lacquer 8% to be applied to affected toenails once daily for 48 weeks and removed once weekly with nail polish remover. For tinea pedis, prescription sent to pharmacy for Ketoconazole Cream 2% to be applied to both feet and between toes qd x 6 weeks. 3. Patient to continue soft, supportive shoe gear. 4. Patient to report any pedal injuries to medical professional immediately. 5. Follow up 3 months.  6. Patient/POA to call should there be a concern in the interim.

## 2019-02-17 ENCOUNTER — Ambulatory Visit: Payer: Self-pay

## 2019-02-17 NOTE — Chronic Care Management (AMB) (Signed)
  Chronic Care Management   Telephone Outreach Note  02/17/2019 Name: Bridget Good MRN: [id number] DOB: 03/31/1951  Referred by: patient's health plan. Reason for referral : Care Coordination    I reached out to [name] today by phone in response to a referral sent by [name]'s health plan. Unfortunately, today's call was unsuccessful. SW left a HIPAA compliant voice message requesting a return call.  The CM team will reach out to the patient again over the next 7 days.    [name], FNP has been notified of this outreach and the CCM team follow up plan to introduce services.   Daneen Schick, BSW, CDP [facility] Care Management Social Worker [phone]

## 2019-02-19 ENCOUNTER — Ambulatory Visit: Payer: Self-pay

## 2019-02-19 DIAGNOSIS — I10 Essential (primary) hypertension: Secondary | ICD-10-CM

## 2019-02-19 DIAGNOSIS — G9689 Other specified disorders of central nervous system: Secondary | ICD-10-CM

## 2019-02-19 DIAGNOSIS — G968 Other specified disorders of central nervous system: Secondary | ICD-10-CM

## 2019-02-19 DIAGNOSIS — E119 Type 2 diabetes mellitus without complications: Secondary | ICD-10-CM

## 2019-02-19 NOTE — Patient Instructions (Signed)
Social Worker Visit Information    Materials provided: Verbal education about CCM program provided by phone  [name] was given information about Chronic Care Management services today including:  1. CCM service includes personalized support from designated clinical staff supervised by her physician, including individualized plan of care and coordination with other care providers 2. 24/7 contact phone numbers for assistance for urgent and routine care needs. 3. Service will only be billed when office clinical staff spend 20 minutes or more in a month to coordinate care. 4. Only one practitioner may furnish and bill the service in a calendar month. 5. The patient may stop CCM services at any time (effective at the end of the month) by phone call to the office staff. 6. The patient will be responsible for cost sharing (co-pay) of up to 20% of the service fee (after annual deductible is met).  Patient agreed to services and verbal consent obtained.   The patient verbalized understanding of instructions provided today and declined a print copy of patient instruction materials.   Follow up plan: Appointment scheduled for SW to meet with client in provider office on: Monday March 30   Bridget Good, [place], [place] Bridget Good / Bridget Good Management Social Worker [phone]

## 2019-02-19 NOTE — Chronic Care Management (AMB) (Signed)
  Chronic Care Management   Telephone Outreach Note  02/19/2019 Name: [name] MRN: [id number] DOB: 05/09/1951  Referred by: patient's health plan. Reason for referral : Care Coordination    I reached out to [name] today by phone in response to a referral sent by [name]'s health plan.  [name] was given information about Chronic Care Management services today including:  1. CCM service includes personalized support from designated clinical staff supervised by her physician, including individualized plan of care and coordination with other care providers 2. 24/7 contact phone numbers for assistance for urgent and routine care needs. 3. Service will only be billed when office clinical staff spend 20 minutes or more in a month to coordinate care. 4. Only one practitioner may furnish and bill the service in a calendar month. 5. The patient may stop CCM services at any time (effective at the end of the month) by phone call to the office staff. 6. The patient will be responsible for cost sharing (co-pay) of up to 20% of the service fee (after annual deductible is met).  Patient agreed to services and verbal consent obtained.   Face to Face appointment with CCM team member scheduled for: March 30th   [name], FNP has been notified of this outreach and Ms. Francetta Found Bolanos's decision and plan.   Daneen Schick, BSW, CDP [facility] Care Management Social Worker [phone]  Total time spent performing care coordination and/or care management activities with the patient by phone or face to face = 10 minutes.

## 2019-02-20 ENCOUNTER — Encounter (HOSPITAL_COMMUNITY): Payer: Self-pay

## 2019-02-20 ENCOUNTER — Other Ambulatory Visit: Payer: Self-pay

## 2019-02-20 ENCOUNTER — Encounter (HOSPITAL_COMMUNITY)
Admission: RE | Admit: 2019-02-20 | Discharge: 2019-02-20 | Disposition: A | Discharge status: Home / Self Care | Payer: PPO | Source: Ambulatory Visit | Attending: Neurological Surgery | Admitting: Neurological Surgery

## 2019-02-20 DIAGNOSIS — Z01812 Encounter for preprocedural laboratory examination: Secondary | ICD-10-CM | POA: Insufficient documentation

## 2019-02-20 DIAGNOSIS — G9389 Other specified disorders of brain: Secondary | ICD-10-CM | POA: Diagnosis not present

## 2019-02-20 HISTORY — DX: Major depressive disorder, single episode, unspecified: F32.9

## 2019-02-20 HISTORY — DX: Anxiety disorder, unspecified: F41.9

## 2019-02-20 HISTORY — DX: Cerebral infarction, unspecified: I63.9

## 2019-02-20 HISTORY — DX: Depression, unspecified: F32.A

## 2019-02-20 LAB — BASIC METABOLIC PANEL
Anion gap: 9 (ref 5–15)
BUN: 20 mg/dL (ref 8–23)
CO2: 26 mmol/L (ref 22–32)
Calcium: 9.2 mg/dL (ref 8.9–10.3)
Chloride: 105 mmol/L (ref 98–111)
Creatinine, Ser: 1.2 mg/dL — ABNORMAL HIGH (ref 0.44–1.00)
GFR calc Af Amer: 55 mL/min — ABNORMAL LOW (ref >60)
GFR calc non Af Amer: 47 mL/min — ABNORMAL LOW (ref >60)
Glucose, Bld: 197 mg/dL — ABNORMAL HIGH (ref 70–99)
Potassium: 3.9 mmol/L (ref 3.5–5.1)
Sodium: 140 mmol/L (ref 135–145)

## 2019-02-20 LAB — HEMOGLOBIN A1C
Hgb A1c MFr Bld: 7 % — ABNORMAL HIGH (ref 4.8–5.6)
Mean Plasma Glucose: 154.2 mg/dL

## 2019-02-20 LAB — TYPE AND SCREEN
ABO/RH(D): O POS
Antibody Screen: NEGATIVE

## 2019-02-20 LAB — CBC
HCT: 45.5 % (ref 36.0–46.0)
Hemoglobin: 14.4 g/dL (ref 12.0–15.0)
MCH: 29.4 pg (ref 26.0–34.0)
MCHC: 31.6 g/dL (ref 30.0–36.0)
MCV: 93 fL (ref 80.0–100.0)
Platelets: 307 10*3/uL (ref 150–400)
RBC: 4.89 MIL/uL (ref 3.87–5.11)
RDW: 14.2 % (ref 11.5–15.5)
WBC: 5.1 10*3/uL (ref 4.0–10.5)
nRBC: 0 % (ref 0.0–0.2)

## 2019-02-20 LAB — ABO/RH: ABO/RH(D): O POS

## 2019-02-20 LAB — GLUCOSE, CAPILLARY: Glucose-Capillary: 188 mg/dL — ABNORMAL HIGH (ref 70–99)

## 2019-02-20 NOTE — Progress Notes (Signed)
PCP - [name] Cardiologist - Dr. Terrence Dupont  Chest x-ray - N/A EKG - 11-14-18 Stress Test - 12-19 ECHO - 8-19 Cardiac Cath - 2007  DM - Type 2 Fasting Blood Sugar - pt stated she doesn't know Checks blood sugar once a day   Blood Thinner Instructions: follow your surgeon's instructions Aspirin Instructions: follow your surgeon's instructions  Anesthesia review: yes, EKG   Patient denies shortness of breath, fever, cough and chest pain at PAT appointment   Patient verbalized understanding of instructions that were given to them at the PAT appointment. Patient was also instructed that they will need to review over the PAT instructions again at home before surgery.

## 2019-02-20 NOTE — Pre-Procedure Instructions (Signed)
Bridget Good  02/20/2019      [id number] - [place], [place] [address] [place] [address] Phone: [phone] Fax: [phone]    Your procedure is scheduled on Mar. 20  Report to [facility] Entrance A at 5:30 A.M.  Call this number if you have problems the morning of surgery:  [phone]   Remember:  Do not eat or drink after midnight.      Take these medicines the morning of surgery with A SIP OF WATER :              Tylenol if needed             Metoprolol (toprol-xl)             Nitroglycerine if needed             Pantoprazole (protonix)              7 days prior to surgery STOP taking any Aspirin (unless otherwise instructed by your surgeon), Aleve, Naproxen, Ibuprofen, Motrin, Advil, Goody's, BC's, all herbal medications, fish oil, and all vitamins.                Follow your surgeon's instructions on when to stop Asprin and plavix.  If no instructions were given by your surgeon then you will need to call the office to get those instructions.                     How to Manage Your Diabetes Before and After Surgery  Why is it important to control my blood sugar before and after surgery? . Improving blood sugar levels before and after surgery helps healing and can limit problems. . A way of improving blood sugar control is eating a healthy diet by: o  Eating less sugar and carbohydrates o  Increasing activity/exercise o  Talking with your doctor about reaching your blood sugar goals . High blood sugars (greater than 180 mg/dL) can raise your risk of infections and slow your recovery, so you will need to focus on controlling your diabetes during the weeks before surgery. . Make sure that the doctor who takes care of your diabetes knows about your planned surgery including the date and location.  How do I manage my blood sugar before surgery? . Check your blood  sugar at least 4 times a day, starting 2 days before surgery, to make sure that the level is not too high or low. o Check your blood sugar the morning of your surgery when you wake up and every 2 hours until you get to the Short Stay unit. . If your blood sugar is less than 70 mg/dL, you will need to treat for low blood sugar: o Do not take insulin. o Treat a low blood sugar (less than 70 mg/dL) with  cup of clear juice (cranberry or apple), 4 glucose tablets, OR glucose gel. Recheck blood sugar in 15 minutes after treatment (to make sure it is greater than 70 mg/dL). If your blood sugar is not greater than 70 mg/dL on recheck, call [phone] o  for further instructions. . Report your blood sugar to the short stay nurse when you get to Short Stay.  . If you are admitted to the hospital after surgery: o Your blood sugar will be checked by the staff and you will probably be given insulin after surgery (instead of  oral diabetes medicines) to make sure you have good blood sugar levels. o The goal for blood sugar control after surgery is 80-180 mg/dL.       WHAT DO I DO ABOUT MY DIABETES MEDICATION?   Marland Kitchen Do not take oral diabetes medicines (pills) the morning of surgery.    . THE  DAY BEFORE SURGERY DO NOT TAKE EMPAGLIFLOZIN (JARDIANCE)     Do not wear jewelry, make-up or nail polish.  Do not wear lotions, powders, or perfumes, or deodorant.  Do not shave 48 hours prior to surgery.  Men may shave face and neck.  Do not bring valuables to the hospital.  [facility] is not responsible for any belongings or valuables.  Contacts, dentures or bridgework may not be worn into surgery.  Leave your suitcase in the car.  After surgery it may be brought to your room.  For patients admitted to the hospital, discharge time will be determined by your treatment team.  Patients discharged the day of surgery will not be allowed to drive home.    Special instructions:  [facility]- Preparing For  Surgery  Before surgery, you can play an important role. Because skin is not sterile, your skin needs to be as free of germs as possible. You can reduce the number of germs on your skin by washing with CHG (chlorahexidine gluconate) Soap before surgery.  CHG is an antiseptic cleaner which kills germs and bonds with the skin to continue killing germs even after washing.    Oral Hygiene is also important to reduce your risk of infection.  Remember - BRUSH YOUR TEETH THE MORNING OF SURGERY WITH YOUR REGULAR TOOTHPASTE  Please do not use if you have an allergy to CHG or antibacterial soaps. If your skin becomes reddened/irritated stop using the CHG.  Do not shave (including legs and underarms) for at least 48 hours prior to first CHG shower. It is OK to shave your face.  Please follow these instructions carefully.   1. Shower the NIGHT BEFORE SURGERY and the MORNING OF SURGERY with CHG.   2. If you chose to wash your hair, wash your hair first as usual with your normal shampoo.  3. After you shampoo, rinse your hair and body thoroughly to remove the shampoo.  4. Use CHG as you would any other liquid soap. You can apply CHG directly to the skin and wash gently with a scrungie or a clean washcloth.   5. Apply the CHG Soap to your body ONLY FROM THE NECK DOWN.  Do not use on open wounds or open sores. Avoid contact with your eyes, ears, mouth and genitals (private parts). Wash Face and genitals (private parts)  with your normal soap.  6. Wash thoroughly, paying special attention to the area where your surgery will be performed.  7. Thoroughly rinse your body with warm water from the neck down.  8. DO NOT shower/wash with your normal soap after using and rinsing off the CHG Soap.  9. Pat yourself dry with a CLEAN TOWEL.  10. Wear CLEAN PAJAMAS to bed the night before surgery, wear comfortable clothes the morning of surgery  11. Place CLEAN SHEETS on your bed the night of your first shower and  DO NOT SLEEP WITH PETS.    Day of Surgery:  Do not apply any deodorants/lotions.  Please wear clean clothes to the hospital/surgery center.   Remember to brush your teeth WITH YOUR REGULAR TOOTHPASTE.    Please read over the  following fact sheets that you were given. Coughing and Deep Breathing and Surgical Site Infection Prevention

## 2019-02-23 ENCOUNTER — Ambulatory Visit: Payer: Self-pay | Admitting: Pharmacist

## 2019-02-23 DIAGNOSIS — M898X8 Other specified disorders of bone, other site: Secondary | ICD-10-CM | POA: Diagnosis not present

## 2019-02-23 DIAGNOSIS — C410 Malignant neoplasm of bones of skull and face: Secondary | ICD-10-CM | POA: Diagnosis not present

## 2019-02-23 DIAGNOSIS — D164 Benign neoplasm of bones of skull and face: Secondary | ICD-10-CM | POA: Diagnosis not present

## 2019-02-23 DIAGNOSIS — G9389 Other specified disorders of brain: Secondary | ICD-10-CM | POA: Diagnosis not present

## 2019-02-23 NOTE — Anesthesia Preprocedure Evaluation (Addendum)
Anesthesia Evaluation  Patient identified by MRN, date of birth, ID band Patient awake    Reviewed: Allergy & Precautions, NPO status , Patient's Chart, lab work & pertinent test results  History of Anesthesia Complications Negative for: history of anesthetic complications  Airway Mallampati: I  TM Distance: >3 FB Neck ROM: Full    Dental  (+) Teeth Intact, Dental Advisory Given,    Pulmonary neg pulmonary ROS,    breath sounds clear to auscultation       Cardiovascular hypertension, Pt. on medications + angina + CAD and + Peripheral Vascular Disease  (-) Past MI + dysrhythmias  Rhythm:Regular     Neuro/Psych PSYCHIATRIC DISORDERS Anxiety Depression  Neuromuscular disease CVA, Residual Symptoms    GI/Hepatic Neg liver ROS, GERD  Medicated and Controlled,  Endo/Other  diabetes  Renal/GU Renal disease     Musculoskeletal  (+) Arthritis , Fibromyalgia -  Abdominal   Peds  Hematology   Anesthesia Other Findings  - Left ventricle: The cavity size was normal. Wall thickness was   increased in a pattern of mild LVH. Systolic function was normal.   The estimated ejection fraction was in the range of 60% to 65%.   Wall motion was normal; there were no regional wall motion   abnormalities. Doppler parameters are consistent with abnormal   left ventricular relaxation (grade 1 diastolic dysfunction). - Aortic valve: Mildly calcified annulus. Trileaflet. - Tricuspid valve: There was mild-moderate regurgitation.  Reproductive/Obstetrics                           Anesthesia Physical Anesthesia Plan  ASA: III  Anesthesia Plan: General   Post-op Pain Management:    Induction: Intravenous  PONV Risk Score and Plan: 3 and Ondansetron and Dexamethasone  Airway Management Planned: Oral ETT  Additional Equipment: Arterial line  Intra-op Plan:   Post-operative Plan: Extubation in OR  Informed  Consent: I have reviewed the patients History and Physical, chart, labs and discussed the procedure including the risks, benefits and alternatives for the proposed anesthesia with the patient or authorized representative who has indicated his/her understanding and acceptance.     Dental advisory given  Plan Discussed with: CRNA and Surgeon  Anesthesia Plan Comments: (Admitted to [facility] 07/11/18 with double vision and right 3d nerve palsy.  She also had chest pain and cardiology was consulted. Troponin were negative, Myoview low risk, echo normal LVF. The pt's MRI done 8/3 showed a punctate acute infarction in the right para median pons. She was seen by Neurology and placed on ASA and Plavix, continues to follow with neurology and cardiology outpatient.   Cardiology clearance from Dr. Terrence Dupont  02/24/19 states pt low risk can hold ASA and Plavix.  -Echo 07/12/18 showed EF 60% to 65%, normal wall motion, grade 1 dd, mild-mod TR. -Nuclear stress 01/21/19 showed small reversible perfusion defect at the inferior septal wall at the base. EF 63%. Non invasive risk stratification*: Low)   Anesthesia Quick Evaluation

## 2019-02-26 IMAGING — MR MR HEAD WO/W CM
11 series · 48 of 48 positions shown · IV contrast (15 ML MULTIHANCE)
Comparison: 07/12/2018 and 05/26/2015 MRI of the head.

CLINICAL DATA: 67 y/o  F; increase headaches and frontal pain.

EXAM:
MRI HEAD WITHOUT AND WITH CONTRAST
TECHNIQUE: Multiplanar, multiecho pulse sequences of the brain and surrounding
structures were obtained without and with intravenous contrast.
CONTRAST:  15mL MULTIHANCE GADOBENATE DIMEGLUMINE 529 MG/ML IV SOLN

[Series 3: T1 · sagittal · 5.0mm · 0.45mm/px · 1 of 19 slices shown]
[im 1/19]
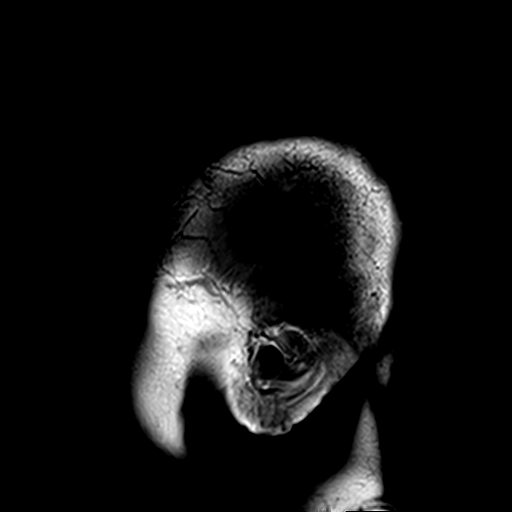

[Series 4: DWI · axial · 3.0mm · 1.80mm/px · z∈[-85,+62]mm · 6 of 99 slices shown (1 of 2)]
[im 1/99]
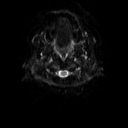
[im 20/99]
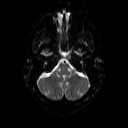
[im 40/99]
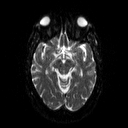
[im 59/99]
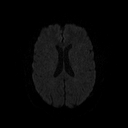
[im 79/99]
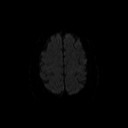
[im 99/99]
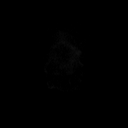

[Series 5: DWI · axial · 3.0mm · 1.80mm/px · z∈[-85,+62]mm · 4 of 48 slices shown (2 of 2)]
[im 1/48]
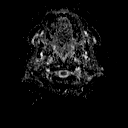
[im 16/48]
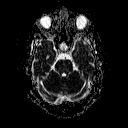
[im 32/48]
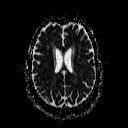
[im 48/48]
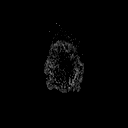

[Series 6: T2 · axial · 5.0mm · 0.60mm/px · z∈[-78,+58]mm · 2 of 22 slices shown (1 of 2)]
[im 1/22]
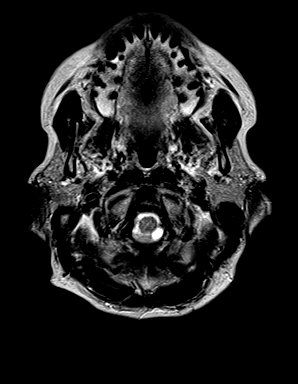
[im 22/22]
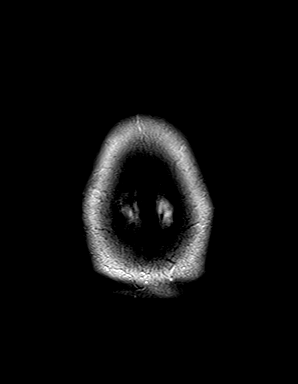

[Series 7: FLAIR · axial · 3.0mm · 0.45mm/px · z∈[-79,+56]mm · 2 of 30 slices shown]
[im 1/30]
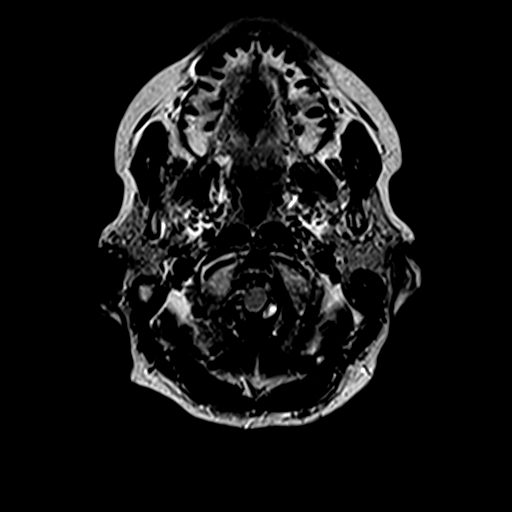
[im 30/30]
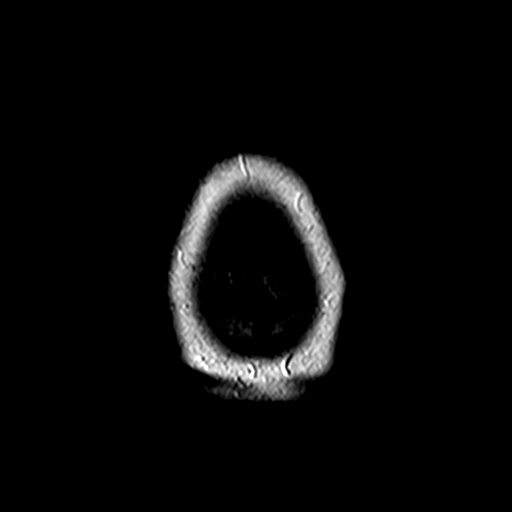

[Series 9: swi_images · axial · 2.0mm · 0.90mm/px · z∈[-90,+68]mm · 6 of 80 slices shown]
[im 1/80]
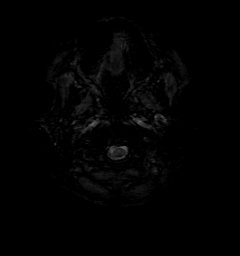
[im 16/80]
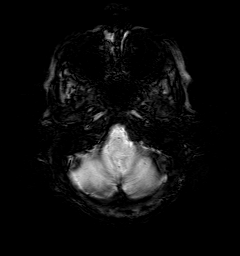
[im 32/80]
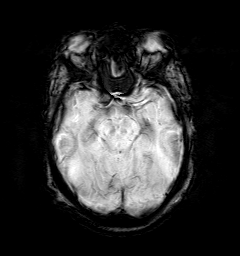
[im 48/80]
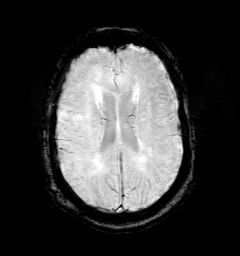
[im 64/80]
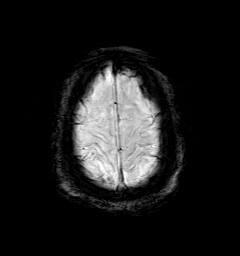
[im 80/80]
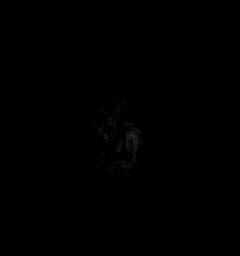

[Series 10: t1_mpr_tra · axial · 1.0mm · 0.72mm/px · z∈[-81,+62]mm · 11 of 144 slices shown (1 of 2)]
[im 1/144]
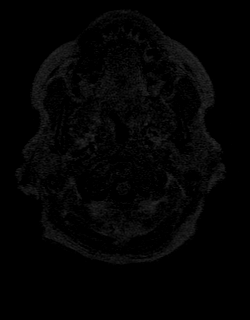
[im 15/144]
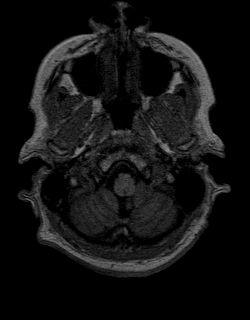
[im 29/144]
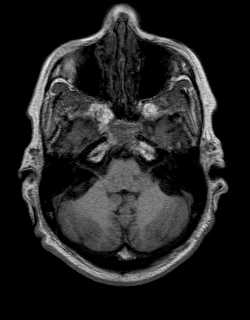
[im 43/144]
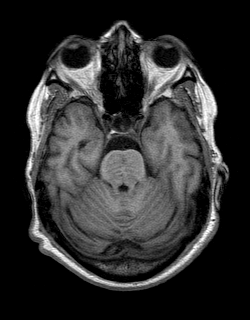
[im 58/144]
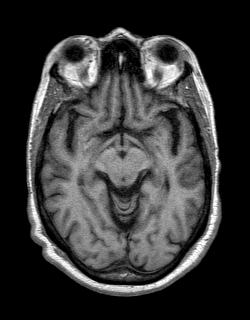
[im 72/144]
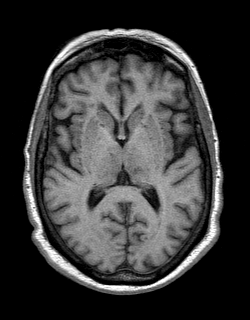
[im 86/144]
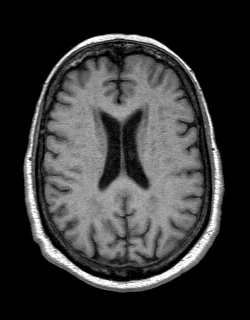
[im 101/144]
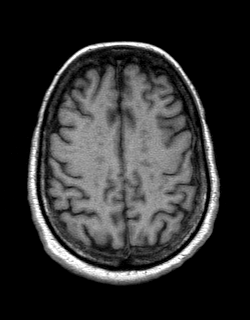
[im 115/144]
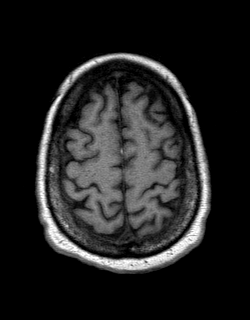
[im 129/144]
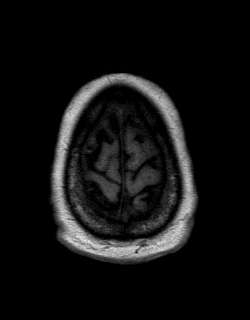
[im 144/144]
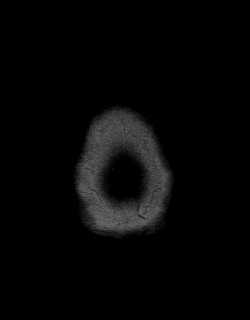

[Series 11: T2 · coronal · 5.0mm · 0.45mm/px · 2 of 25 slices shown (2 of 2)]
[im 1/25]
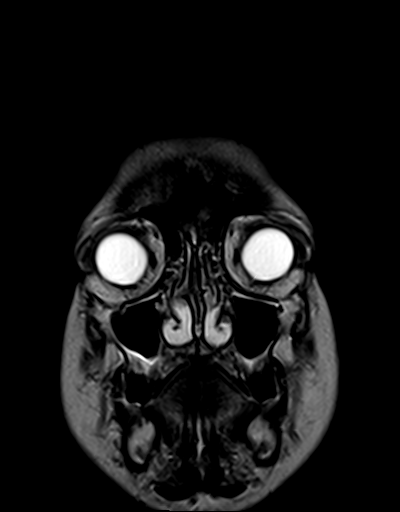
[im 25/25]
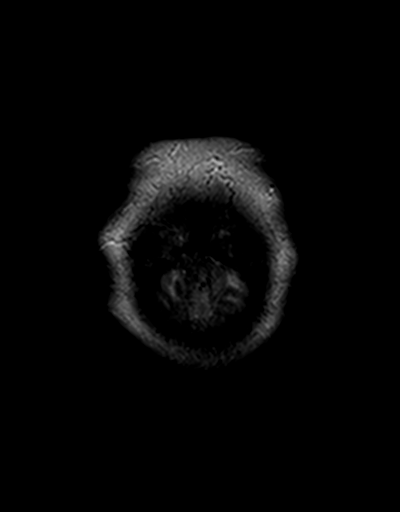

[Series 12: t1_mpr_tra · axial · 1.0mm · 0.72mm/px · z∈[-81,+62]mm · 11 of 144 slices shown (2 of 2)]
[im 1/144]
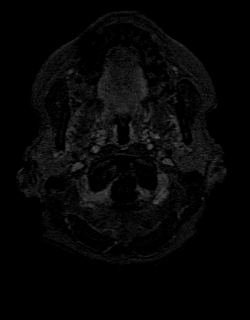
[im 15/144]
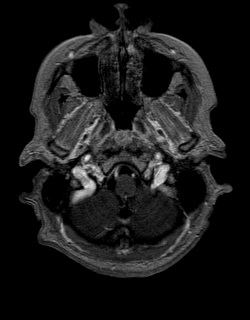
[im 29/144]
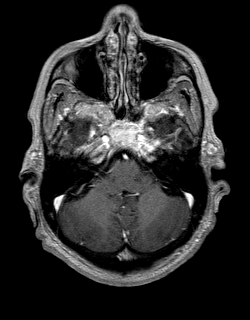
[im 43/144]
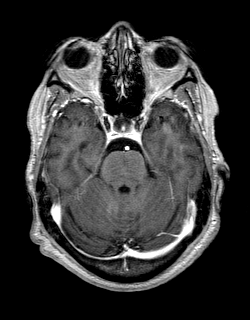
[im 58/144]
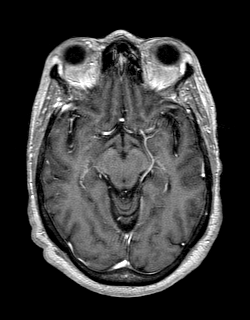
[im 72/144]
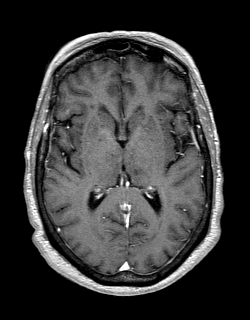
[im 86/144]
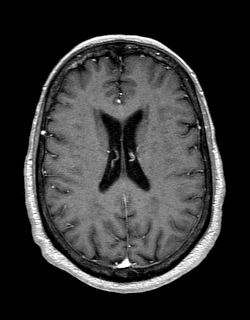
[im 101/144]
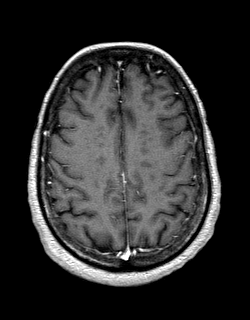
[im 115/144]
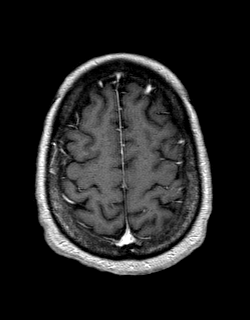
[im 129/144]
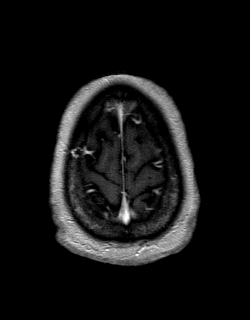
[im 144/144]
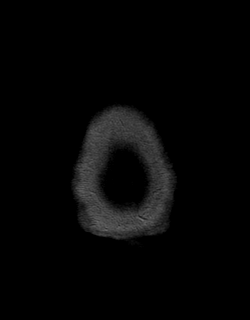

[Series 13: post cor · coronal · 5.0mm · 0.45mm/px · 2 of 25 slices shown]
[im 1/25]
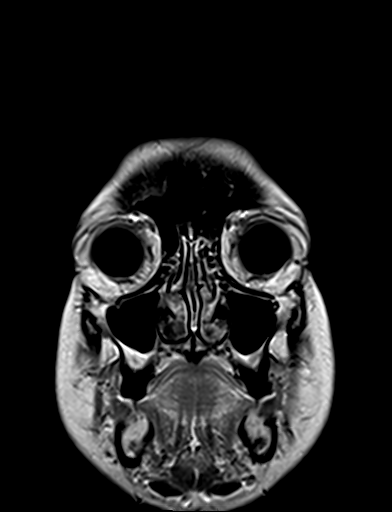
[im 25/25]
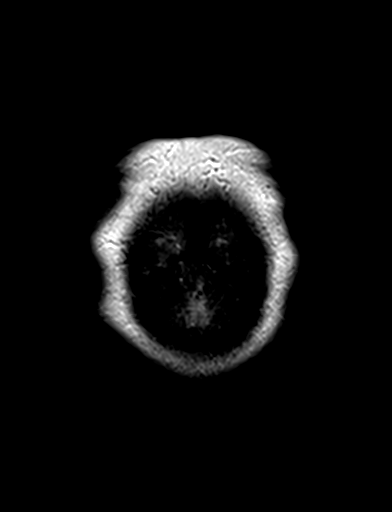

[Series 14: post sag · sagittal · 5.0mm · 0.45mm/px · 1 of 19 slices shown]
[im 1/19]
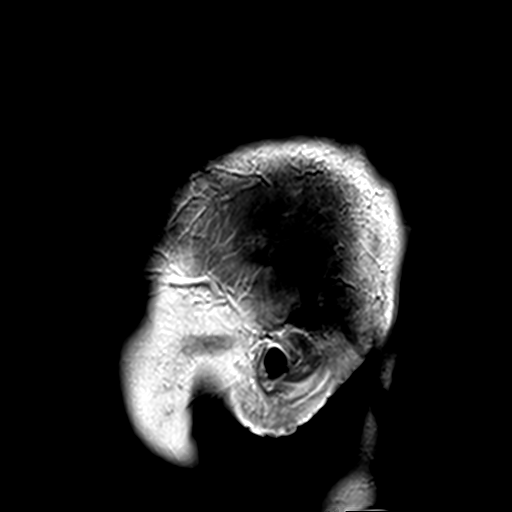

[48 of 48 positions shown; findings below may reference images not displayed]

FINDINGS: Brain: No acute infarction, hemorrhage, hydrocephalus, extra-axial
collection or mass lesion. Small chronic lacunar infarcts are
present within the pons, left thalamus, and right caudate head.
Stable early confluent nonspecific T2 FLAIR hyperintensities in
subcortical and periventricular white matter as well as the pons are
compatible with moderate chronic microvascular ischemic changes.
Stable mild volume loss of the brain. Few punctate foci of
susceptibility hypointensity are present in right cerebellum as well
as supratentorial brain compatible hemosiderin deposition of chronic
microhemorrhage.

Vascular: Normal flow voids.

Skull and upper cervical spine: Enhancing lesion within the clivus
is mildly increased in size from the prior MRI of the head with
rightward and posterior extension. The axial plane it measures
approximately 18 x 24 mm, previously 18 x 19 mm measured in a
similar fashion (series 12, image 29). No additional calvarial
lesion is identified.

Sinuses/Orbits: Negative.

Other: None.
IMPRESSION: 1. Enhancing lesion within the clivus is mildly increased in size
prior study. Findings may represent metastasis, myeloma, or possibly
invasive skull base tumor. Inflammatory process such as Paget's is
also possible.
2. No acute intracranial abnormality. Stable chronic microvascular
ischemic changes, volume loss, and small chronic infarcts of the
brain.

## 2019-02-27 ENCOUNTER — Inpatient Hospital Stay (HOSPITAL_COMMUNITY): Payer: PPO | Admitting: Vascular Surgery

## 2019-02-27 ENCOUNTER — Other Ambulatory Visit: Payer: Self-pay

## 2019-02-27 ENCOUNTER — Inpatient Hospital Stay (HOSPITAL_COMMUNITY)
Admission: RE | Admit: 2019-02-27 | Discharge: 2019-02-28 | DRG: 027 | Disposition: A | Discharge status: Home / Self Care | Payer: PPO | Attending: Neurological Surgery | Admitting: Neurological Surgery

## 2019-02-27 ENCOUNTER — Encounter (HOSPITAL_COMMUNITY)
Admission: RE | Disposition: A | Discharge status: Home / Self Care | Payer: Self-pay | Source: Home / Self Care | Attending: Neurological Surgery

## 2019-02-27 ENCOUNTER — Encounter (HOSPITAL_COMMUNITY): Payer: Self-pay | Admitting: *Deleted

## 2019-02-27 DIAGNOSIS — G0490 Encephalitis and encephalomyelitis, unspecified: Secondary | ICD-10-CM | POA: Diagnosis not present

## 2019-02-27 DIAGNOSIS — Z8249 Family history of ischemic heart disease and other diseases of the circulatory system: Secondary | ICD-10-CM

## 2019-02-27 DIAGNOSIS — M898X8 Other specified disorders of bone, other site: Secondary | ICD-10-CM | POA: Diagnosis present

## 2019-02-27 DIAGNOSIS — G939 Disorder of brain, unspecified: Secondary | ICD-10-CM | POA: Diagnosis not present

## 2019-02-27 DIAGNOSIS — K219 Gastro-esophageal reflux disease without esophagitis: Secondary | ICD-10-CM | POA: Diagnosis present

## 2019-02-27 DIAGNOSIS — J323 Chronic sphenoidal sinusitis: Secondary | ICD-10-CM | POA: Diagnosis not present

## 2019-02-27 DIAGNOSIS — Z841 Family history of disorders of kidney and ureter: Secondary | ICD-10-CM

## 2019-02-27 DIAGNOSIS — M797 Fibromyalgia: Secondary | ICD-10-CM | POA: Diagnosis present

## 2019-02-27 DIAGNOSIS — Z833 Family history of diabetes mellitus: Secondary | ICD-10-CM | POA: Diagnosis not present

## 2019-02-27 DIAGNOSIS — Z8673 Personal history of transient ischemic attack (TIA), and cerebral infarction without residual deficits: Secondary | ICD-10-CM | POA: Diagnosis not present

## 2019-02-27 DIAGNOSIS — Z90710 Acquired absence of both cervix and uterus: Secondary | ICD-10-CM

## 2019-02-27 DIAGNOSIS — G9389 Other specified disorders of brain: Secondary | ICD-10-CM | POA: Diagnosis not present

## 2019-02-27 DIAGNOSIS — I119 Hypertensive heart disease without heart failure: Secondary | ICD-10-CM | POA: Diagnosis present

## 2019-02-27 DIAGNOSIS — I25119 Atherosclerotic heart disease of native coronary artery with unspecified angina pectoris: Secondary | ICD-10-CM | POA: Diagnosis not present

## 2019-02-27 DIAGNOSIS — I739 Peripheral vascular disease, unspecified: Secondary | ICD-10-CM | POA: Diagnosis not present

## 2019-02-27 DIAGNOSIS — Z882 Allergy status to sulfonamides status: Secondary | ICD-10-CM | POA: Diagnosis not present

## 2019-02-27 HISTORY — PX: CRANIOTOMY: SHX93

## 2019-02-27 HISTORY — PX: ENDOSCOPIC TRANS NASAL APPROACH WITH FUSION: SHX6750

## 2019-02-27 LAB — GLUCOSE, CAPILLARY
Glucose-Capillary: 99 mg/dL (ref 70–99)
Glucose-Capillary: 149 mg/dL — ABNORMAL HIGH (ref 70–99)

## 2019-02-27 SURGERY — CRANIOTOMY HYPOPHYSECTOMY TRANSNASAL APPROACH
Anesthesia: General | Site: Head

## 2019-02-27 MED ORDER — ONDANSETRON HCL 4 MG/2ML IJ SOLN
INTRAMUSCULAR | Status: AC
  Filled 2019-02-27: qty 2

## 2019-02-27 MED ORDER — HYDRALAZINE HCL 20 MG/ML IJ SOLN
INTRAMUSCULAR | Status: AC
  Filled 2019-02-27: qty 1

## 2019-02-27 MED ORDER — ACETAMINOPHEN 325 MG PO TABS
650.0000 mg | ORAL_TABLET | ORAL | Status: DC | PRN
  Administered 2019-02-27: 650 mg via ORAL
  Filled 2019-02-27: qty 2

## 2019-02-27 MED ORDER — CICLOPIROX 8 % EX SOLN: 1.0000 "application " | Freq: Every day | CUTANEOUS | Status: DC

## 2019-02-27 MED ORDER — HYDRALAZINE HCL 20 MG/ML IJ SOLN
10.0000 mg | Freq: Once | INTRAMUSCULAR | Status: AC
  Administered 2019-02-27: 10 mg via INTRAVENOUS

## 2019-02-27 MED ORDER — ONDANSETRON HCL 4 MG/2ML IJ SOLN: 4.0000 mg | INTRAMUSCULAR | Status: DC | PRN

## 2019-02-27 MED ORDER — PHENYLEPHRINE 40 MCG/ML (10ML) SYRINGE FOR IV PUSH (FOR BLOOD PRESSURE SUPPORT)
PREFILLED_SYRINGE | INTRAVENOUS | Status: AC
  Filled 2019-02-27: qty 10

## 2019-02-27 MED ORDER — LIDOCAINE-EPINEPHRINE 1 %-1:100000 IJ SOLN
INTRAMUSCULAR | Status: AC
  Filled 2019-02-27: qty 1

## 2019-02-27 MED ORDER — ACETAMINOPHEN 500 MG PO TABS: 1000.0000 mg | ORAL_TABLET | Freq: Once | ORAL | Status: DC | PRN

## 2019-02-27 MED ORDER — LABETALOL HCL 5 MG/ML IV SOLN
INTRAVENOUS | Status: AC
  Filled 2019-02-27: qty 4

## 2019-02-27 MED ORDER — HYDROMORPHONE HCL 1 MG/ML IJ SOLN
0.5000 mg | INTRAMUSCULAR | Status: DC | PRN
  Administered 2019-02-27: 0.5 mg via INTRAVENOUS
  Filled 2019-02-27: qty 1

## 2019-02-27 MED ORDER — SUGAMMADEX SODIUM 200 MG/2ML IV SOLN
INTRAVENOUS | Status: DC | PRN
  Administered 2019-02-27: 200 mg via INTRAVENOUS

## 2019-02-27 MED ORDER — PROMETHAZINE HCL 25 MG/ML IJ SOLN
6.2500 mg | INTRAMUSCULAR | Status: DC | PRN
  Administered 2019-02-27: 6.25 mg via INTRAVENOUS

## 2019-02-27 MED ORDER — CLEVIDIPINE BUTYRATE 0.5 MG/ML IV EMUL
INTRAVENOUS | Status: AC
  Filled 2019-02-27: qty 50

## 2019-02-27 MED ORDER — VALSARTAN-HYDROCHLOROTHIAZIDE 320-12.5 MG PO TABS: 1.0000 | ORAL_TABLET | Freq: Every day | ORAL | Status: DC

## 2019-02-27 MED ORDER — AMLODIPINE BESYLATE 10 MG PO TABS
10.0000 mg | ORAL_TABLET | Freq: Every day | ORAL | Status: DC
  Administered 2019-02-28: 10 mg via ORAL
  Filled 2019-02-27: qty 1

## 2019-02-27 MED ORDER — PROMETHAZINE HCL 25 MG/ML IJ SOLN
INTRAMUSCULAR | Status: AC
  Filled 2019-02-27: qty 1

## 2019-02-27 MED ORDER — CANAGLIFLOZIN 100 MG PO TABS: 100.0000 mg | ORAL_TABLET | Freq: Every day | ORAL | Status: DC

## 2019-02-27 MED ORDER — CEFAZOLIN SODIUM-DEXTROSE 1-4 GM/50ML-% IV SOLN
1.0000 g | Freq: Three times a day (TID) | INTRAVENOUS | Status: AC
  Administered 2019-02-27 – 2019-02-28 (×3): 1 g via INTRAVENOUS
  Filled 2019-02-27 (×3): qty 50

## 2019-02-27 MED ORDER — PROPOFOL 10 MG/ML IV BOLUS
INTRAVENOUS | Status: DC | PRN
  Administered 2019-02-27: 130 mg via INTRAVENOUS

## 2019-02-27 MED ORDER — ROCURONIUM BROMIDE 50 MG/5ML IV SOSY
PREFILLED_SYRINGE | INTRAVENOUS | Status: DC | PRN
  Administered 2019-02-27: 10 mg via INTRAVENOUS
  Administered 2019-02-27: 50 mg via INTRAVENOUS

## 2019-02-27 MED ORDER — OXYCODONE HCL 5 MG PO TABS: 5.0000 mg | ORAL_TABLET | Freq: Once | ORAL | Status: DC | PRN

## 2019-02-27 MED ORDER — PANTOPRAZOLE SODIUM 40 MG PO TBEC
40.0000 mg | DELAYED_RELEASE_TABLET | Freq: Every day | ORAL | Status: DC
  Administered 2019-02-28: 40 mg via ORAL
  Filled 2019-02-27 (×2): qty 1

## 2019-02-27 MED ORDER — CLEVIDIPINE BUTYRATE 0.5 MG/ML IV EMUL
0.0000 mg/h | INTRAVENOUS | Status: DC
  Administered 2019-02-27: 1 mg/h via INTRAVENOUS

## 2019-02-27 MED ORDER — POTASSIUM CHLORIDE CRYS ER 20 MEQ PO TBCR: 20.0000 meq | EXTENDED_RELEASE_TABLET | Freq: Every day | ORAL | Status: DC

## 2019-02-27 MED ORDER — LIDOCAINE 2% (20 MG/ML) 5 ML SYRINGE
INTRAMUSCULAR | Status: DC | PRN
  Administered 2019-02-27: 60 mg via INTRAVENOUS

## 2019-02-27 MED ORDER — THROMBIN 5000 UNITS EX SOLR
OROMUCOSAL | Status: DC | PRN
  Administered 2019-02-27: 08:00:00

## 2019-02-27 MED ORDER — SODIUM CHLORIDE 0.9 % IV SOLN
0.0500 ug/kg/min | INTRAVENOUS | Status: AC
  Administered 2019-02-27: .3 ug/kg/min via INTRAVENOUS
  Filled 2019-02-27: qty 4000

## 2019-02-27 MED ORDER — 0.9 % SODIUM CHLORIDE (POUR BTL) OPTIME
TOPICAL | Status: DC | PRN
  Administered 2019-02-27: 2000 mL

## 2019-02-27 MED ORDER — CEFAZOLIN SODIUM-DEXTROSE 2-4 GM/100ML-% IV SOLN
2.0000 g | INTRAVENOUS | Status: AC
  Administered 2019-02-27: 2 g via INTRAVENOUS

## 2019-02-27 MED ORDER — NITROGLYCERIN 0.4 MG SL SUBL: 0.4000 mg | SUBLINGUAL_TABLET | SUBLINGUAL | Status: DC | PRN

## 2019-02-27 MED ORDER — HYDROCODONE-ACETAMINOPHEN 5-325 MG PO TABS
1.0000 | ORAL_TABLET | ORAL | Status: DC | PRN
  Administered 2019-02-27 – 2019-02-28 (×3): 1 via ORAL
  Filled 2019-02-27 (×3): qty 1

## 2019-02-27 MED ORDER — CLEVIDIPINE BUTYRATE 0.5 MG/ML IV EMUL
0.0000 mg/h | INTRAVENOUS | Status: DC
  Administered 2019-02-27: 2 mg/h via INTRAVENOUS
  Administered 2019-02-27: 3 mg/h via INTRAVENOUS
  Filled 2019-02-27: qty 50

## 2019-02-27 MED ORDER — SODIUM CHLORIDE 0.9 % IV SOLN
INTRAVENOUS | Status: DC | PRN
  Administered 2019-02-27: 07:00:00 via INTRAVENOUS

## 2019-02-27 MED ORDER — METOPROLOL SUCCINATE ER 25 MG PO TB24
ORAL_TABLET | ORAL | Status: AC
  Filled 2019-02-27: qty 2

## 2019-02-27 MED ORDER — HEMOSTATIC AGENTS (NO CHARGE) OPTIME
TOPICAL | Status: DC | PRN
  Administered 2019-02-27: 1

## 2019-02-27 MED ORDER — FENTANYL CITRATE (PF) 100 MCG/2ML IJ SOLN: 25.0000 ug | INTRAMUSCULAR | Status: DC | PRN

## 2019-02-27 MED ORDER — PROMETHAZINE HCL 25 MG PO TABS: 12.5000 mg | ORAL_TABLET | ORAL | Status: DC | PRN

## 2019-02-27 MED ORDER — LIDOCAINE 2% (20 MG/ML) 5 ML SYRINGE
INTRAMUSCULAR | Status: AC
  Filled 2019-02-27: qty 5

## 2019-02-27 MED ORDER — MUPIROCIN CALCIUM 2 % EX CREA
TOPICAL_CREAM | CUTANEOUS | Status: AC
  Filled 2019-02-27: qty 15

## 2019-02-27 MED ORDER — DOCUSATE SODIUM 100 MG PO CAPS
100.0000 mg | ORAL_CAPSULE | Freq: Two times a day (BID) | ORAL | Status: DC
  Administered 2019-02-27: 100 mg via ORAL
  Filled 2019-02-27: qty 1

## 2019-02-27 MED ORDER — CHLORHEXIDINE GLUCONATE CLOTH 2 % EX PADS: 6.0000 | MEDICATED_PAD | Freq: Once | CUTANEOUS | Status: DC

## 2019-02-27 MED ORDER — METOPROLOL SUCCINATE ER 50 MG PO TB24
50.0000 mg | ORAL_TABLET | Freq: Every day | ORAL | Status: DC
  Administered 2019-02-27: 50 mg via ORAL
  Filled 2019-02-27: qty 1

## 2019-02-27 MED ORDER — OXYMETAZOLINE HCL 0.05 % NA SOLN
NASAL | Status: AC
  Filled 2019-02-27: qty 30

## 2019-02-27 MED ORDER — MIDAZOLAM HCL 2 MG/2ML IJ SOLN
INTRAMUSCULAR | Status: AC
  Filled 2019-02-27: qty 2

## 2019-02-27 MED ORDER — ONDANSETRON HCL 4 MG PO TABS: 4.0000 mg | ORAL_TABLET | ORAL | Status: DC | PRN

## 2019-02-27 MED ORDER — METOPROLOL SUCCINATE ER 50 MG PO TB24
50.0000 mg | ORAL_TABLET | Freq: Every day | ORAL | Status: DC
  Administered 2019-02-28: 50 mg via ORAL
  Filled 2019-02-27 (×2): qty 1

## 2019-02-27 MED ORDER — THROMBIN 5000 UNITS EX SOLR
CUTANEOUS | Status: AC
  Filled 2019-02-27: qty 5000

## 2019-02-27 MED ORDER — SUCCINYLCHOLINE CHLORIDE 200 MG/10ML IV SOSY
PREFILLED_SYRINGE | INTRAVENOUS | Status: AC
  Filled 2019-02-27: qty 10

## 2019-02-27 MED ORDER — ROSUVASTATIN CALCIUM 20 MG PO TABS
20.0000 mg | ORAL_TABLET | Freq: Every day | ORAL | Status: DC
  Administered 2019-02-28: 20 mg via ORAL
  Filled 2019-02-27 (×2): qty 1

## 2019-02-27 MED ORDER — SODIUM CHLORIDE 0.9 % IR SOLN
Status: DC | PRN
  Administered 2019-02-27: 2000 mL

## 2019-02-27 MED ORDER — ROCURONIUM BROMIDE 50 MG/5ML IV SOSY
PREFILLED_SYRINGE | INTRAVENOUS | Status: AC
  Filled 2019-02-27: qty 5

## 2019-02-27 MED ORDER — AMLODIPINE BESY-BENAZEPRIL HCL 10-40 MG PO CAPS: 1.0000 | ORAL_CAPSULE | Freq: Every day | ORAL | Status: DC

## 2019-02-27 MED ORDER — HYDRALAZINE HCL 20 MG/ML IJ SOLN: 10.0000 mg | INTRAMUSCULAR | Status: DC | PRN

## 2019-02-27 MED ORDER — ONDANSETRON HCL 4 MG/2ML IJ SOLN
INTRAMUSCULAR | Status: DC | PRN
  Administered 2019-02-27: 4 mg via INTRAVENOUS

## 2019-02-27 MED ORDER — SODIUM CHLORIDE 0.9 % IV SOLN
INTRAVENOUS | Status: DC | PRN
  Administered 2019-02-27 – 2019-02-28 (×2): 1000 mL via INTRAVENOUS

## 2019-02-27 MED ORDER — MUPIROCIN 2 % EX OINT
TOPICAL_OINTMENT | CUTANEOUS | Status: AC
  Filled 2019-02-27: qty 22

## 2019-02-27 MED ORDER — SALINE SPRAY 0.65 % NA SOLN
4.0000 | NASAL | Status: DC | PRN
  Filled 2019-02-27: qty 44

## 2019-02-27 MED ORDER — ACETAMINOPHEN 10 MG/ML IV SOLN
INTRAVENOUS | Status: AC
  Filled 2019-02-27: qty 100

## 2019-02-27 MED ORDER — BENAZEPRIL HCL 20 MG PO TABS
40.0000 mg | ORAL_TABLET | Freq: Every day | ORAL | Status: DC
  Administered 2019-02-28: 40 mg via ORAL
  Filled 2019-02-27: qty 2

## 2019-02-27 MED ORDER — SODIUM CHLORIDE 0.9 % IV SOLN
INTRAVENOUS | Status: DC | PRN
  Administered 2019-02-27: 500 mL

## 2019-02-27 MED ORDER — ARTIFICIAL TEARS OPHTHALMIC OINT
TOPICAL_OINTMENT | OPHTHALMIC | Status: DC | PRN
  Administered 2019-02-27: 1 via OPHTHALMIC

## 2019-02-27 MED ORDER — DEXAMETHASONE SODIUM PHOSPHATE 10 MG/ML IJ SOLN
INTRAMUSCULAR | Status: DC | PRN
  Administered 2019-02-27: 5 mg via INTRAVENOUS

## 2019-02-27 MED ORDER — FENTANYL CITRATE (PF) 250 MCG/5ML IJ SOLN
INTRAMUSCULAR | Status: DC | PRN
  Administered 2019-02-27: 50 ug via INTRAVENOUS

## 2019-02-27 MED ORDER — CEFAZOLIN SODIUM-DEXTROSE 2-4 GM/100ML-% IV SOLN
INTRAVENOUS | Status: AC
  Filled 2019-02-27: qty 100

## 2019-02-27 MED ORDER — ACETAMINOPHEN 650 MG RE SUPP: 650.0000 mg | RECTAL | Status: DC | PRN

## 2019-02-27 MED ORDER — TRIAMCINOLONE ACETONIDE 40 MG/ML IJ SUSP
INTRAMUSCULAR | Status: AC
  Filled 2019-02-27: qty 5

## 2019-02-27 MED ORDER — FENTANYL CITRATE (PF) 250 MCG/5ML IJ SOLN
INTRAMUSCULAR | Status: AC
  Filled 2019-02-27: qty 5

## 2019-02-27 MED ORDER — PROPOFOL 10 MG/ML IV BOLUS
INTRAVENOUS | Status: AC
  Filled 2019-02-27: qty 40

## 2019-02-27 MED ORDER — SUCCINYLCHOLINE CHLORIDE 200 MG/10ML IV SOSY
PREFILLED_SYRINGE | INTRAVENOUS | Status: DC | PRN
  Administered 2019-02-27: 100 mg via INTRAVENOUS

## 2019-02-27 MED ORDER — BENAZEPRIL HCL 20 MG PO TABS
40.0000 mg | ORAL_TABLET | Freq: Once | ORAL | Status: AC
  Administered 2019-02-27: 40 mg via ORAL
  Filled 2019-02-27: qty 2

## 2019-02-27 MED ORDER — ALBUMIN HUMAN 5 % IV SOLN
INTRAVENOUS | Status: DC | PRN
  Administered 2019-02-27 (×2): via INTRAVENOUS

## 2019-02-27 MED ORDER — ACETAMINOPHEN 160 MG/5ML PO SOLN: 1000.0000 mg | Freq: Once | ORAL | Status: DC | PRN

## 2019-02-27 MED ORDER — SODIUM CHLORIDE 0.9 % IV SOLN
INTRAVENOUS | Status: DC | PRN
  Administered 2019-02-27: 20 ug/min via INTRAVENOUS

## 2019-02-27 MED ORDER — ACETAMINOPHEN 10 MG/ML IV SOLN
1000.0000 mg | Freq: Once | INTRAVENOUS | Status: DC | PRN
  Administered 2019-02-27: 1000 mg via INTRAVENOUS

## 2019-02-27 MED ORDER — CEPHALEXIN 500 MG PO CAPS: 500.0000 mg | ORAL_CAPSULE | Freq: Three times a day (TID) | ORAL | 1 refills | Status: AC

## 2019-02-27 MED ORDER — ARTIFICIAL TEARS OPHTHALMIC OINT
TOPICAL_OINTMENT | OPHTHALMIC | Status: AC
  Filled 2019-02-27: qty 3.5

## 2019-02-27 MED ORDER — OXYCODONE HCL 5 MG/5ML PO SOLN: 5.0000 mg | Freq: Once | ORAL | Status: DC | PRN

## 2019-02-27 MED ORDER — OXYMETAZOLINE HCL 0.05 % NA SOLN
NASAL | Status: DC | PRN
  Administered 2019-02-27 (×2): 1 via TOPICAL

## 2019-02-27 MED ORDER — LABETALOL HCL 5 MG/ML IV SOLN
INTRAVENOUS | Status: DC | PRN
  Administered 2019-02-27 (×2): 2.5 mg via INTRAVENOUS
  Administered 2019-02-27: 5 mg via INTRAVENOUS

## 2019-02-27 MED ORDER — LIDOCAINE-EPINEPHRINE 1 %-1:100000 IJ SOLN
INTRAMUSCULAR | Status: DC | PRN
  Administered 2019-02-27: 5 mL

## 2019-02-27 SURGICAL SUPPLY — 113 items
APL SKNCLS STERI-STRIP NONHPOA (GAUZE/BANDAGES/DRESSINGS) ×1
ATTRACTOMAT 16X20 MAGNETIC DRP (DRAPES) ×2 IMPLANT
BAG DECANTER FOR FLEXI CONT (MISCELLANEOUS) ×2 IMPLANT
BENZOIN TINCTURE PRP APPL 2/3 (GAUZE/BANDAGES/DRESSINGS) ×2 IMPLANT
BLADE RAD 40 CVD SINUS 4MM (BLADE) IMPLANT
BLADE ROTATE TRICUT 4X13 M4 (BLADE) ×2 IMPLANT
BLADE SURG 10 STRL SS (BLADE) ×2 IMPLANT
BLADE SURG 11 STRL SS (BLADE) ×4 IMPLANT
BLADE SURG 15 STRL LF DISP TIS (BLADE) ×2 IMPLANT
BLADE SURG 15 STRL SS (BLADE) ×4
BUR DIAMOND 13X5 70D (BURR) IMPLANT
BUR DIAMOND CURV 15X5 15D (BURR) ×1 IMPLANT
CABLE BIPOLOR RESECTION CORD (MISCELLANEOUS) ×2 IMPLANT
CANISTER SUCT 3000ML PPV (MISCELLANEOUS) ×6 IMPLANT
COAGULATOR SUCT 8FR VV (MISCELLANEOUS) ×2 IMPLANT
COVER BACK TABLE 60X90IN (DRAPES) IMPLANT
COVER MAYO STAND STRL (DRAPES) ×2 IMPLANT
COVER WAND RF STERILE (DRAPES) ×4 IMPLANT
DRAPE HALF SHEET 40X57 (DRAPES) ×4 IMPLANT
DRAPE INCISE IOBAN 66X45 STRL (DRAPES) ×2 IMPLANT
DRAPE MICROSCOPE LEICA (MISCELLANEOUS) IMPLANT
DRAPE POUCH INSTRU U-SHP 10X18 (DRAPES) ×2 IMPLANT
DRAPE SURG 17X23 STRL (DRAPES) ×6 IMPLANT
DRESSING NASAL POPE 10X1.5X2.5 (GAUZE/BANDAGES/DRESSINGS) IMPLANT
DRSG NASAL POPE 10X1.5X2.5 (GAUZE/BANDAGES/DRESSINGS)
DRSG NASOPORE 8CM (GAUZE/BANDAGES/DRESSINGS) ×1 IMPLANT
DRSG TELFA 3X8 NADH (GAUZE/BANDAGES/DRESSINGS) ×2 IMPLANT
DURAPREP 26ML APPLICATOR (WOUND CARE) ×2 IMPLANT
ELECT COATED BLADE 2.86 ST (ELECTRODE) IMPLANT
ELECT NDL TIP 2.8 STRL (NEEDLE) ×1 IMPLANT
ELECT NEEDLE TIP 2.8 STRL (NEEDLE) ×2 IMPLANT
ELECT REM PT RETURN 9FT ADLT (ELECTROSURGICAL) ×4
ELECTRODE REM PT RTRN 9FT ADLT (ELECTROSURGICAL) ×2 IMPLANT
GAUZE PACKING FOLDED 2  STR (GAUZE/BANDAGES/DRESSINGS) ×1
GAUZE PACKING FOLDED 2 STR (GAUZE/BANDAGES/DRESSINGS) ×1 IMPLANT
GAUZE SPONGE 2X2 8PLY STRL LF (GAUZE/BANDAGES/DRESSINGS) ×1 IMPLANT
GAUZE SPONGE 4X4 12PLY STRL (GAUZE/BANDAGES/DRESSINGS) ×2 IMPLANT
GLOVE BIO SURGEON STRL SZ7.5 (GLOVE) ×2 IMPLANT
GLOVE BIOGEL M 7.0 STRL (GLOVE) ×4 IMPLANT
GLOVE BIOGEL PI IND STRL 7.0 (GLOVE) IMPLANT
GLOVE BIOGEL PI IND STRL 7.5 (GLOVE) ×1 IMPLANT
GLOVE BIOGEL PI INDICATOR 7.0 (GLOVE) ×3
GLOVE BIOGEL PI INDICATOR 7.5 (GLOVE) ×2
GLOVE EXAM NITRILE LRG STRL (GLOVE) IMPLANT
GLOVE EXAM NITRILE XL STR (GLOVE) IMPLANT
GLOVE EXAM NITRILE XS STR PU (GLOVE) IMPLANT
GLOVE SURG SS PI 7.0 STRL IVOR (GLOVE) ×5 IMPLANT
GOWN STRL REUS W/ TWL LRG LVL3 (GOWN DISPOSABLE) ×2 IMPLANT
GOWN STRL REUS W/ TWL XL LVL3 (GOWN DISPOSABLE) IMPLANT
GOWN STRL REUS W/TWL 2XL LVL3 (GOWN DISPOSABLE) ×2 IMPLANT
GOWN STRL REUS W/TWL LRG LVL3 (GOWN DISPOSABLE) ×4
GOWN STRL REUS W/TWL XL LVL3 (GOWN DISPOSABLE)
HEMOSTAT POWDER KIT SURGIFOAM (HEMOSTASIS) ×2 IMPLANT
HEMOSTAT POWDER SURGIFOAM 1G (HEMOSTASIS) ×1 IMPLANT
HEMOSTAT SURGICEL .5X2 ABSORB (HEMOSTASIS) ×1 IMPLANT
HEMOSTAT SURGICEL 2X14 (HEMOSTASIS) IMPLANT
KIT BASIN OR (CUSTOM PROCEDURE TRAY) ×4 IMPLANT
KIT DRAIN CSF ACCUDRAIN (MISCELLANEOUS) IMPLANT
KIT TURNOVER KIT B (KITS) ×4 IMPLANT
KNIFE ARACHNOID DISP AM-21-S (BLADE) IMPLANT
NDL HYPO 25GX1X1/2 BEV (NEEDLE) ×1 IMPLANT
NDL HYPO 25X1 1.5 SAFETY (NEEDLE) ×1 IMPLANT
NDL SPNL 22GX3.5 QUINCKE BK (NEEDLE) ×1 IMPLANT
NDL SPNL 25GX3.5 QUINCKE BL (NEEDLE) ×1 IMPLANT
NEEDLE HYPO 25GX1X1/2 BEV (NEEDLE) ×2 IMPLANT
NEEDLE HYPO 25X1 1.5 SAFETY (NEEDLE) ×2 IMPLANT
NEEDLE SPNL 22GX3.5 QUINCKE BK (NEEDLE) ×2 IMPLANT
NEEDLE SPNL 25GX3.5 QUINCKE BL (NEEDLE) ×2 IMPLANT
NS IRRIG 1000ML POUR BTL (IV SOLUTION) ×4 IMPLANT
PAD ARMBOARD 7.5X6 YLW CONV (MISCELLANEOUS) ×6 IMPLANT
PAD DRESSING TELFA 3X8 NADH (GAUZE/BANDAGES/DRESSINGS) IMPLANT
PATTIES SURGICAL .25X.25 (GAUZE/BANDAGES/DRESSINGS) IMPLANT
PATTIES SURGICAL .5 X.5 (GAUZE/BANDAGES/DRESSINGS) IMPLANT
PATTIES SURGICAL .5 X3 (DISPOSABLE) ×2 IMPLANT
PENCIL BUTTON HOLSTER BLD 10FT (ELECTRODE) ×2 IMPLANT
PROBE FOR NEUROSURGERY (MISCELLANEOUS) IMPLANT
RUBBERBAND STERILE (MISCELLANEOUS) ×4 IMPLANT
SHEATH ENDOSCRUB 0 DEG (SHEATH) ×2 IMPLANT
SHEATH ENDOSCRUB 30 DEG (SHEATH) ×1 IMPLANT
SHEATH ENDOSCRUB 45 DEG (SHEATH) ×1 IMPLANT
SOLUTION BUTLER CLEAR DIP (MISCELLANEOUS) ×1 IMPLANT
SPECIMEN JAR SMALL (MISCELLANEOUS) IMPLANT
SPLINT NASAL DOYLE BI-VL (GAUZE/BANDAGES/DRESSINGS) IMPLANT
SPONGE GAUZE 2X2 STER 10/PKG (GAUZE/BANDAGES/DRESSINGS) ×1
SPONGE LAP 4X18 RFD (DISPOSABLE) ×2 IMPLANT
SPONGE NEURO XRAY DETECT 1X3 (DISPOSABLE) ×4 IMPLANT
SPONGE SURGIFOAM ABS GEL SZ50 (HEMOSTASIS) IMPLANT
STAPLER SKIN PROX WIDE 3.9 (STAPLE) ×2 IMPLANT
STRIP CLOSURE SKIN 1/2X4 (GAUZE/BANDAGES/DRESSINGS) ×2 IMPLANT
SUT 5.0 PDS RB-1 (SUTURE)
SUT BONE WAX W31G (SUTURE) ×2 IMPLANT
SUT ETHILON 3 0 FSL (SUTURE) IMPLANT
SUT ETHILON 3 0 PS 1 (SUTURE) IMPLANT
SUT ETHILON 6 0 P 1 (SUTURE) IMPLANT
SUT PDS AB 4-0 RB1 27 (SUTURE) IMPLANT
SUT PDS PLUS AB 5-0 RB-1 (SUTURE) IMPLANT
SUT PLAIN 4 0 ~~LOC~~ 1 (SUTURE) IMPLANT
SUT VIC AB 4-0 P-3 18X BRD (SUTURE) IMPLANT
SUT VIC AB 4-0 P3 18 (SUTURE)
SYR CONTROL 10ML LL (SYRINGE) ×2 IMPLANT
TOWEL GREEN STERILE (TOWEL DISPOSABLE) ×2 IMPLANT
TOWEL GREEN STERILE FF (TOWEL DISPOSABLE) ×2 IMPLANT
TOWEL NATURAL 6PK STERILE (DISPOSABLE) ×2 IMPLANT
TRACKER ENT INSTRUMENT (MISCELLANEOUS) ×2 IMPLANT
TRACKER ENT PATIENT (MISCELLANEOUS) ×2 IMPLANT
TRAP SPECIMEN MUCOUS 40CC (MISCELLANEOUS) IMPLANT
TRAY ENT MC OR (CUSTOM PROCEDURE TRAY) ×4 IMPLANT
TRAY FOLEY MTR SLVR 16FR STAT (SET/KITS/TRAYS/PACK) ×2 IMPLANT
TUBE CONNECTING 12X1/4 (SUCTIONS) ×2 IMPLANT
TUBING EXTENTION W/L.L. (IV SETS) ×2 IMPLANT
TUBING STRAIGHTSHOT EPS 5PK (TUBING) ×2 IMPLANT
WATER STERILE IRR 1000ML POUR (IV SOLUTION) ×2 IMPLANT
YANKAUER SUCT BULB TIP NO VENT (SUCTIONS) ×1 IMPLANT

## 2019-02-27 NOTE — Anesthesia Postprocedure Evaluation (Signed)
Anesthesia Post Note  Patient: Bridget Good  Procedure(s) Performed: Endonasal Endoscopic biopsy of clival mass (N/A Head) ENDOSCOPIC TRANS NASAL APPROACH WITH FUSION (N/A Head)     Patient location during evaluation: PACU Anesthesia Type: General Level of consciousness: awake and alert Pain management: pain level controlled Vital Signs Assessment: post-procedure vital signs reviewed and stable Respiratory status: spontaneous breathing, nonlabored ventilation, respiratory function stable and patient connected to nasal cannula oxygen Cardiovascular status: blood pressure returned to baseline and stable Postop Assessment: no apparent nausea or vomiting Anesthetic complications: no    Last Vitals:  Vitals:   02/27/19 1530 02/27/19 1600  BP: (!) 155/85 (!) 156/79  Pulse: 83 70  Resp: (!) 22 (!) 21  Temp:    SpO2: 98% 98%    Last Pain:  Vitals:   02/27/19 1500  TempSrc: Oral  PainSc:                  [name]

## 2019-02-27 NOTE — Anesthesia Procedure Notes (Signed)
Procedure Name: Intubation Date/Time: 02/27/2019 8:00 AM Performed by: Harden Mo, CRNA Pre-anesthesia Checklist: Patient identified, Emergency Drugs available, Suction available and Patient being monitored Patient Re-evaluated:Patient Re-evaluated prior to induction Oxygen Delivery Method: Circle System Utilized Preoxygenation: Pre-oxygenation with 100% oxygen Induction Type: IV induction and Rapid sequence Laryngoscope Size: Miller and 2 Grade View: Grade II Tube type: Oral Tube size: 7.5 mm Number of attempts: 1 Airway Equipment and Method: Stylet and Oral airway Placement Confirmation: ETT inserted through vocal cords under direct vision,  positive ETCO2 and breath sounds checked- equal and bilateral Secured at: 22 cm Tube secured with: Tape Dental Injury: Teeth and Oropharynx as per pre-operative assessment

## 2019-02-27 NOTE — Transfer of Care (Signed)
Immediate Anesthesia Transfer of Care Note  Patient: Bridget Good  Procedure(s) Performed: Endonasal Endoscopic biopsy of clival mass (N/A Head) ENDOSCOPIC TRANS NASAL APPROACH WITH FUSION (N/A Head)  Patient Location: PACU  Anesthesia Type:General  Level of Consciousness: awake, alert  and oriented  Airway & Oxygen Therapy: Patient Spontanous Breathing and Patient connected to face mask oxygen  Post-op Assessment: Report given to RN, Post -op Vital signs reviewed and stable and Patient moving all extremities X 4  Post vital signs: Reviewed and stable  Last Vitals:  Vitals Value Taken Time  BP 210/145 02/27/2019 10:25 AM  Temp    Pulse 78 02/27/2019 10:31 AM  Resp 13 02/27/2019 10:31 AM  SpO2 100 % 02/27/2019 10:31 AM  Vitals shown include unvalidated device data.  Last Pain:  Vitals:   02/27/19 0600  TempSrc:   PainSc: 3       Patients Stated Pain Goal: 3 (60/04/59 9774)  Complications: No apparent anesthesia complications

## 2019-02-27 NOTE — Brief Op Note (Signed)
02/27/2019  9:56 AM  PATIENT:  Bridget Good  68 y.o. female  PRE-OPERATIVE DIAGNOSIS:  Brain Mass  POST-OPERATIVE DIAGNOSIS:  Brain Mass  PROCEDURE:  Procedure(s) with comments: Endonasal Endoscopic biopsy of clival mass (N/A) - Endonasal Endoscopic biopsy of clival mass ENDOSCOPIC TRANS NASAL APPROACH WITH FUSION (N/A) - ENDOSCOPIC TRANS NASAL APPROACH WITH FUSION  SURGEON:  Surgeon(s) and Role:    * Swade Shonka, Joyice Faster, MD - Co-surgeon    * Jerrell Belfast, MD - Co-Surgeon   ANESTHESIA:   general  EBL:  100cc  BLOOD ADMINISTERED:none  DRAINS: none   LOCAL MEDICATIONS USED:  LIDOCAINE   SPECIMEN:  Clival biopsy  DISPOSITION OF SPECIMEN:  PATHOLOGY  COUNTS:  YES  TOURNIQUET:  * No tourniquets in log *  DICTATION: .Note written in EPIC  PLAN OF CARE: Admit to inpatient   PATIENT DISPOSITION:  PACU - hemodynamically stable.   Delay start of Pharmacological VTE agent (>24hrs) due to surgical blood loss or risk of bleeding: yes

## 2019-02-27 NOTE — Op Note (Signed)
Operative Note:  ENDOSCOPIC TRANSSPHENOIDAL BIOPSY OF CLIVAL MASS WITH NAVIGATION      Patient: Bridget Good record number: [id number]  Date:02/27/2019  Pre-operative Indications: 1.   Clival Mass       Postoperative Indications: Same  Surgical Procedure: 1. Endoscopic transsphenoidal approach to Clival mass with intraoperative navigation          Anesthesia: GET  Surgeon: Delsa Bern, M.D.  Neurosurgeon: Dr. Zada Finders  Complications: None  EBL: 300 cc  Findings: Normal sinus anatomy.  Bony changes of the clivus with significant venous bleeding. Naso-pore sphenoid packing placed.  Note: The neurosurgical component of the operative procedure is dictated as a separate operative note.   Brief History: The patient is a 68 y.o. female with a history of pituitary mass. The patient has a history of memory changes who underwent work-up through her neurologist.  MRI scan and additional testing showed enhancing abnormality involving the midline skull base, specifically the clivus. The patient was referred to Dr. Zada Finders for neurosurgical evaluation.  Patient seen by me at [facility] preoperatively with review of nasal anatomy and sinus CT scan for navigation.  Given the patient's history and findings, the above surgical procedures were recommended, risks and benefits were discussed in detail with the patient.  They understand and agree with our plan for surgery which is scheduled at [facility] under general anesthesia.  Surgical Procedure: The patient is brought to the neurosurgical operating room on 02/27/2019 and placed in supine position on the operating table. General endotracheal anesthesia was established without difficulty. When the patient was adequately anesthetized, surgical timeout was performed with correct identification of the patient and the surgical procedure. The patient's nose was then injected with 5 cc of 1% lidocaine 1:100,000 dilution  epinephrine which was injected in a submucosal fashion. The patient's nose was then packed with Afrin-soaked cottonoid pledgets were left in place for approximately 10 minutes to allow for vasoconstriction and hemostasis.  The Xomed Fusion navigation headgear was applied and anatomic and surgical landmarks were identified and confirmed, navigation was used throughout the sinus component of the surgical procedure.  With the patient prepped draped and prepared for surgery, nasal endoscopy was performed on the patient's right.  The middle turbinate was carefully lateralized to allow access to the posterior aspect of the nasal passageway.  The right sphenoid sinus ostium was identified.  The inferior aspect of the superior turbinate was then resected with through-cutting forceps and a microdebrider.  The right sphenoid sinus ostium was enlarged in a superior and lateral direction using the microdebrider and through-cutting forceps to create a widely patent ostium.  Nasal endoscopy on the patient's left-hand side was then undertaken.  The left middle turbinate was lateralized and the posterior nasal cavity was visualized with identification of the left sphenoid sinus ostium using navigation.  The inferior aspect of the superior turbinate was resected and the sinus ostium was enlarged in the lateral and superior direction to create a wide sphenoid sinus ostium.  A posterior septectomy was then performed with a Surveyor, quantity.  Bone, cartilage and soft tissue was then resected to create a wide posterior septotomy.  The anterior face of the sphenoid sinus and sphenoid sinus septum were then resected with a combination of through-cutting forceps, osteotome and microdebrider to allow direct access to the entire posterior aspect of the sphenoid sinus.  Using the [facility] drill and working inferiorly to the pituitary fossa the anterior space of the bone of the  clivus/posterior sphenoid sinus was removed.  With adequate  access to the clivus bone the neurosurgical component of the procedure was begun by Dr. Zada Finders.  This is dictated as a separate operative report.  Biopsy of the clivus tumor was undertaken using direct visualization of the 0 degree endoscope, navigation and blunt and sharp dissection.  With biopsy completed, stasis was obtained using FloSeal.  Surgicel was placed over the posterior sphenoid defect and the sphenoid sinus was loosely packed with Naso-pore absorbable nasal packing.  There was no active bleeding and no evidence of spinal fluid leak.  The sphenoid sinus was carefully inspected, no further bleeding along the mucosal margins, sphenoidotomy sites or posterior septectomy.  The patient's nasal cavity was irrigated and suctioned.  Surgical sponge count was correct. An oral gastric tube was passed and the stomach contents were aspirated. Patient was awakened from anesthetic and transferred from the operating room to the recovery room in stable condition. There were no complications and blood loss was 300 cc.   Delsa Bern, M.D. [facility] 02/27/2019

## 2019-02-27 NOTE — H&P (Signed)
Surgical H&P Update  HPI: 68 y.o. woman with clival mass, here for endonasal biopsy. Extensive workup has unfortunately not been able to diagnose the nature of this lesion. No changes in health since she was last seen. She is having some headaches but otherwise has remained asymptomatic. The lesion has increased in size on subsequent imaging and therefore presents a risk to neurovascular structures if it continues to grows without treatment.   PMHx:  Past Medical History:  Diagnosis Date  . Angina   . Anxiety   . Bronchitis   . Depression   . Diabetes mellitus   . Fibromyalgia   . GERD (gastroesophageal reflux disease)   . Headache(784.0)   . Hyperlipidemia   . Hypertension   . Hypertensive heart disease without CHF   . Lupus ([place])    "treated for it from 1992 til 2012; dr said I don't have it anymore"  . Obesity (BMI 30-39.9)   . Osteoarthritis   . Pneumonia   . Shortness of breath    lying down, upon exertion  . Shortness of breath on exertion   . Stroke [facility])    2019   FamHx:  Family History  Problem Relation Age of Onset  . Heart attack Father   . Diabetes Father   . Hypertension Mother   . Heart attack Brother   . Kidney failure Brother   . Diabetes Sister   . Diabetes Sister    SocHx:  reports that she has never smoked. She has never used smokeless tobacco. She reports that she does not drink alcohol or use drugs.  Physical Exam: AOx3, PERRL, FS, TM  Strength 5/5 x4, SILTx4  Assesment/Plan: 68 y.o. woman with clival mass, here for endonasal biopsy. Risks, benefits, and alternatives discussed and the patient would like to continue with surgery.  -OR today -4NP post-op  Judith Part, MD 02/27/19 7:02 AM

## 2019-02-27 NOTE — Anesthesia Procedure Notes (Signed)
Arterial Line Insertion Start/End3/20/2020 7:15 AM, 02/27/2019 7:23 AM Performed by: Wilburn Cornelia, CRNA, CRNA  Patient location: Pre-op. Preanesthetic checklist: patient identified, IV checked, site marked, risks and benefits discussed, surgical consent, monitors and equipment checked, pre-op evaluation and anesthesia consent Lidocaine 1% used for infiltration and patient sedated Right, radial was placed Catheter size: 20 G Hand hygiene performed  and maximum sterile barriers used   Attempts: 2 Procedure performed without using ultrasound guided technique. Ultrasound Notes:anatomy identified, needle tip was noted to be adjacent to the nerve/plexus identified and no ultrasound evidence of intravascular and/or intraneural injection Following insertion, dressing applied and Biopatch. Post procedure assessment: normal  Patient tolerated the procedure well with no immediate complications.

## 2019-02-27 NOTE — Consult Note (Signed)
ENT CONSULT:  Reason for Consult: Clivus Lesion Referring Physician:  Dr. Ermelinda Das Bridget Good is an 68 y.o. female.  HPI: mass in clivus  Past Medical History:  Diagnosis Date  . Angina   . Anxiety   . Bronchitis   . Depression   . Diabetes mellitus   . Fibromyalgia   . GERD (gastroesophageal reflux disease)   . Headache(784.0)   . Hyperlipidemia   . Hypertension   . Hypertensive heart disease without CHF   . Lupus ([place])    "treated for it from 1992 til 2012; dr said I don't have it anymore"  . Obesity (BMI 30-39.9)   . Osteoarthritis   . Pneumonia   . Shortness of breath    lying down, upon exertion  . Shortness of breath on exertion   . Stroke [facility])    2019    Past Surgical History:  Procedure Laterality Date  . ABDOMINAL HYSTERECTOMY     partial  . CARDIAC CATHETERIZATION  ~ 2007  . CESAREAN SECTION  1978; 1981  . COLONOSCOPY    . FOOT SURGERY     "had to cut it to let the fluids out; it had swollen very badly; left foot"    Family History  Problem Relation Age of Onset  . Heart attack Father   . Diabetes Father   . Hypertension Mother   . Heart attack Brother   . Kidney failure Brother   . Diabetes Sister   . Diabetes Sister     Social History:  reports that she has never smoked. She has never used smokeless tobacco. She reports that she does not drink alcohol or use drugs.  Allergies:  Allergies  Allergen Reactions  . Sulfa Antibiotics Hives, Itching and Nausea And Vomiting    Medications: I have reviewed the patient's current medications.  Results for orders placed or performed during the hospital encounter of 02/27/19 (from the past 48 hour(s))  Glucose, capillary     Status: None   Collection Time: 02/27/19  6:02 AM  Result Value Ref Range   Glucose-Capillary 99 70 - 99 mg/dL    No results found.  ROS:ROS  Blood pressure (!) 195/92, pulse 70, temperature 98.5 F (36.9 C), temperature source Oral, resp. rate 20, height 5\' 3"   (1.6 m), weight 80.2 kg, SpO2 98 %.  PHYSICAL EXAM: General appearance - alert, well appearing, and in no distress Nose - normal and patent, no erythema, discharge or polyps Mouth - mucous membranes moist, pharynx normal without lesions Neck - supple, no significant adenopathy  Studies Reviewed: CT Sinus and MRI   Assessment/Plan: Adm for Trans-sphenoidal bx of clivus  Jerrell Belfast 02/27/2019, 7:34 AM

## 2019-02-27 NOTE — Op Note (Signed)
PATIENT: Bridget Good  DAY OF SURGERY: 02/27/19   PRE-OPERATIVE DIAGNOSIS:  Clival mass   POST-OPERATIVE DIAGNOSIS:  Clival mass   PROCEDURE:  Endoscopic extended endonasal approach for clival biopsy   SURGEON:  Surgeon(s) and Role:    Judith Part, MD - Co-surgeon    Jerrell Belfast MD - Co-surgeon   ANESTHESIA: ETGA   BRIEF HISTORY: This is a 68 year old woman with an enlarging clival mass. After an extensive workup with multiple imaging modalities, unfortunately it was not possible to obtain a diagnosis.  Given that the mass was expanding towards neurovascular elements, it needed treatment and therefore a tissue diagnosis. This was discussed with the patient as well as risks, benefits, and alternatives and wished to proceed with surgery.   OPERATIVE DETAIL:   The surgical approach was performed by Dr. Jerrell Belfast.  Please see his operative report for details regarding the approach and closure.   For my portion of the procedure, [name] had already created an excellent dissection and working field with good visualization of the rostrum of the sphenoid, sellar portion of the posterior sphenoid, and the junction of the sphenoid and clival bones. The superior aspect of the clivus was visualized and the bone was clearly abnormal. A trough was drilled in the superior aspect of the clivus and a curette and pituitary rongeur were used to remove multiple samples of the bone in that area. The bone was grossly abnormal appearing, softer than usual bone in the clivus, with increased vascularity. We confirmed that this area corresponded to the imaging abnormality with the use of the stereotaxy. With a large enough sample taken, hemostasis was obtained and [name] then performed the reconstruction and closure portions of the procedure.   EBL:  133mL   DRAINS: none   SPECIMENS: Clival mass, sent to pathology   Judith Part, MD 02/27/19 9:58 AM

## 2019-02-28 ENCOUNTER — Encounter (HOSPITAL_COMMUNITY): Payer: Self-pay | Admitting: Neurological Surgery

## 2019-02-28 MED ORDER — HYDROCODONE-ACETAMINOPHEN 5-325 MG PO TABS: 1.0000 | ORAL_TABLET | ORAL | 0 refills | Status: DC | PRN

## 2019-02-28 NOTE — Progress Notes (Signed)
Patient ID: Bridget Good, female   DOB: 1951-02-11, 68 y.o.   MRN: [id number] Vital signs are stable Patient is feeling well We will discharge home today

## 2019-02-28 NOTE — Discharge Summary (Signed)
Physician Discharge Summary  Patient ID: Bridget Good MRN: [id number] DOB/AGE: 66-26-1952 66 y.o.  Admit date: 02/27/2019 Discharge date: 02/28/2019  Admission Diagnoses: Skull base mass  Discharge Diagnoses: Skull base mass Active Problems:   Skull mass   Discharged Condition: good  Hospital Course: Patient was admitted to undergo transnasal skull base biopsy of the clivus.  She tolerated surgery well  Consults: Otorhinolaryngology  Significant Diagnostic Studies: None  Treatments: Trans-nasal skull base biopsy with cranial navigation  Discharge Exam: Blood pressure (!) 141/79, pulse (!) 59, temperature 100 F (37.8 C), temperature source Oral, resp. rate 16, height 5\' 3"  (1.6 m), weight 80.2 kg, SpO2 99 %. No drainage is noted from the nose patient's tolerating postop course without incident  Disposition: Discharge disposition: 01-Home or Self Care       Discharge Instructions    Call MD for:  persistant nausea and vomiting   Complete by:  As directed    Call MD for:  severe uncontrolled pain   Complete by:  As directed    Call MD for:  temperature >100.4   Complete by:  As directed    Diet - low sodium heart healthy   Complete by:  As directed    Diet - low sodium heart healthy   Complete by:  As directed    Discharge instructions   Complete by:  As directed    Sinus/Nasal Instructions:  1. Limited activity 2. Liquid and soft diet 3. May bathe and shower 4. Saline nasal spray - 4 puffs/nostril every hour while awake, begin the morning after surgery 5. Elevate Head of Bed 6. No nose blowing/Open mouth sneeze 7. Alternate Tylenol and ibuprofen every 6 hours as needed for pain.  Call [facility] for any questions or concerns: [phone]   Increase activity slowly   Complete by:  As directed    Increase activity slowly   Complete by:  As directed      Allergies as of 02/28/2019      Reactions   Sulfa Antibiotics Hives, Itching, Nausea And  Vomiting      Medication List    TAKE these medications   acetaminophen 500 MG tablet Commonly known as:  TYLENOL Take 500 mg by mouth as needed for mild pain.   amLODipine-benazepril 10-40 MG capsule Commonly known as:  LOTREL Take 1 capsule by mouth daily.   aspirin EC 81 MG tablet Take 81 mg by mouth daily.   cephALEXin 500 MG capsule Commonly known as:  Keflex Take 1 capsule (500 mg total) by mouth 3 (three) times daily for 10 days.   ciclopirox 8 % solution Commonly known as:  PENLAC Apply topically at bedtime. Apply over nail and surrounding skin. Apply daily over previous coat. Remove weekly with polish remover. What changed:  how much to take   clopidogrel 75 MG tablet Commonly known as:  Plavix Take 1 tablet (75 mg total) by mouth daily.   GINSENG ROYAL JELLY PLUS PO Take 1 capsule by mouth daily.   HYDROcodone-acetaminophen 5-325 MG tablet Commonly known as:  NORCO/VICODIN Take 1 tablet by mouth every 4 (four) hours as needed for moderate pain or severe pain.   Jardiance 10 MG Tabs tablet Generic drug:  empagliflozin Take 10 mg by mouth daily.   ketoconazole 2 % cream Commonly known as:  NIZORAL Apply to both feet and between toes once daily for 6 weeks   metoprolol succinate 50 MG 24 hr tablet Commonly known as:  TOPROL-XL Take  50 mg by mouth daily. Take with or immediately following a meal.   nitroGLYCERIN 0.4 MG SL tablet Commonly known as:  NITROSTAT Place 1 tablet (0.4 mg total) under the tongue every 5 (five) minutes as needed for chest pain.   pantoprazole 40 MG tablet Commonly known as:  PROTONIX Take 1 tablet (40 mg total) by mouth daily at 12 noon.   potassium chloride 10 MEQ tablet Commonly known as:  K-DUR,KLOR-CON Take 20 mEq by mouth daily.   rosuvastatin 20 MG tablet Commonly known as:  CRESTOR Take 1 tablet (20 mg total) by mouth daily.   sertraline 25 MG tablet Commonly known as:  Zoloft Take 1 tablet (25 mg total) by mouth  at bedtime.   traZODone 50 MG tablet Commonly known as:  DESYREL Take 1 tablet (50 mg total) by mouth at bedtime as needed for sleep.   valsartan-hydrochlorothiazide 320-12.5 MG tablet Commonly known as:  DIOVAN-HCT Take 1 tablet by mouth daily.   valsartan-hydrochlorothiazide 160-12.5 MG tablet Commonly known as:  Diovan HCT Take 1 tablet by mouth daily.   Vitamin D (Ergocalciferol) 1.25 MG (50000 UT) Caps capsule Commonly known as:  DRISDOL Take 50,000 Units by mouth every other day.      Follow-up Information    Jerrell Belfast, MD In 2 weeks.   Specialty:  Otolaryngology Contact information: [address] [place] [address] [phone]           Signed: Earleen Newport 02/28/2019, 11:04 AM

## 2019-03-02 ENCOUNTER — Ambulatory Visit: Payer: Self-pay | Admitting: Pharmacist

## 2019-03-02 ENCOUNTER — Telehealth: Payer: Self-pay | Admitting: Nurse Practitioner

## 2019-03-02 ENCOUNTER — Other Ambulatory Visit (INDEPENDENT_AMBULATORY_CARE_PROVIDER_SITE_OTHER): Payer: PPO | Admitting: Nurse Practitioner

## 2019-03-02 DIAGNOSIS — E119 Type 2 diabetes mellitus without complications: Secondary | ICD-10-CM | POA: Diagnosis not present

## 2019-03-02 DIAGNOSIS — I10 Essential (primary) hypertension: Secondary | ICD-10-CM | POA: Diagnosis not present

## 2019-03-02 MED ORDER — EMPAGLIFLOZIN 10 MG PO TABS: 10.0000 mg | ORAL_TABLET | Freq: Every day | ORAL | 3 refills | Status: DC

## 2019-03-02 NOTE — Patient Instructions (Signed)
Subjective:   Visit Information   Goals Addressed            This Visit's Progress   . Patient Stated       I would like to continue to control my BGs and keep my A1c<7%    . Patient Stated       I would like to organize my medications in order to have better adherence.    . Patient Stated       I would like to reduce my copay for Jardiance       Plan: Recommendations discussed with provider - Mail patient pill box for increased adherence - Apply for patient assistance (Jardiance) - Encouraged patient to take sertraline as prescribed & tolerated - Obtain RX for glucometer (One touch covered by insurance)   The patient verbalized understanding of instructions provided today and declined a print copy of patient instruction materials.   The CM team will reach out to the patient again over the next 14 days.   Regina Eck, PharmD, [place] Clinical Pharmacist, [facility] [place]  [phone]

## 2019-03-02 NOTE — Progress Notes (Signed)
  Chronic Care Management   Initial Visit Note  03/02/2019 Name: Bridget Good MRN: [id number] DOB: November 08, 1951  Referred by: [name], FNP Reason for referral : Chronic Care Management and Diabetes   Bridget Good is a 68 y.o. year old female who is a primary care patient of [name], [place]. The CCM team was consulted for assistance with chronic disease management and care coordination needs.   Review of patient status, including review of consultants reports, relevant laboratory and other test results, and collaboration with appropriate care team members and the patient's provider was performed as part of comprehensive patient evaluation and provision of chronic care management services.    Objective:   Goals Addressed            This Visit's Progress   . Patient Stated       I would like to continue to control my BGs and keep my A1c<7%    . Patient Stated       I would like to organize my medications in order to have better adherence.    . Patient Stated       I would like to reduce my copay for Jardiance         Current Barriers:  . Non Adherence to prescribed medication regimen-patient loses track of which medications she has taken throughout the day . Stressors-including new mass pending biopsy . Financial stress-copay for Jardiance  Pharmacist Clinical Goal(s):   Over the next 14 days, patient will obtain free BG meter through insurance: FreeStyle, Precision or One Touch meters are preferred through [facility] . Over the next 14 days, patient will demonstrate Improved medication adherence as evidenced by introduction of pill box to daily regimen  . Over the next 14 days, we will apply for patient assistance with [employer] [place]).  Base on review, patient will qualify.  Interventions: . Comprehensive medication review performed. . Advised patient to use pillbox to help with increased adherence . Discussed plans with patient for  ongoing care management follow up and provided patient with direct contact information for care management team . Collaboration with provider re: medication management  Initial goal documentation  The CM team will reach out to the patient again over the next 14 days.    Regina Eck, PharmD, [place] Clinical Pharmacist, [facility] [place]  [phone]

## 2019-03-02 NOTE — Telephone Encounter (Signed)
Received request for medication assistance program for her Jardiance.  Form completed.

## 2019-03-03 ENCOUNTER — Other Ambulatory Visit: Payer: Self-pay | Admitting: Pharmacy Technician

## 2019-03-03 ENCOUNTER — Ambulatory Visit: Payer: Self-pay

## 2019-03-03 NOTE — Chronic Care Management (AMB) (Signed)
  Chronic Care Management   Social Work Note  03/03/2019 Name: [name] MRN: [id number] DOB: 06-12-1951  AVELEEN NEVERS is a 68 y.o. year old female who sees [name], [place] for primary care. The CCM team was consulted for assistance with chronic disease management and care coordination.  I attempted to outreach Mrs. Parslow to reschedule appointment for March 30th from in person to telephonic. SW left a detailed message to remain at home on this date and expect a phone call rather than in person appointment. SW requested a return call to confirm the patient received the voice message.  Follow Up Plan: Appointment scheduled for SW follow up with client by phone on: March 30th.  Daneen Schick, BSW, CDP [facility] / [facility] Care Management Social Worker [phone]  Total time spent performing care coordination and/or care management activities with the patient by phone or face to face = 2 minutes.

## 2019-03-03 NOTE — Patient Outreach (Signed)
[facility]) Care Management  03/03/2019  Bridget Good [birth year] [id number]                          Medication Assistance Referral  Referral From: [facility] RPh Jenne Pane [facility] RPh)  Medication/Company: Vania Rea / [employer] Patient application portion:  Mailed Provider application portion: Faxed  to J. [facility]   Follow up:  Will follow up with patient in 7-10 business days to confirm application(s) have been received.  Maud Deed Chana Bode [place] Certified Pharmacy Technician [place] Management Direct Dial:[phone]

## 2019-03-09 ENCOUNTER — Other Ambulatory Visit: Payer: Self-pay

## 2019-03-09 ENCOUNTER — Telehealth: Payer: Self-pay

## 2019-03-09 ENCOUNTER — Ambulatory Visit: Payer: Self-pay

## 2019-03-09 DIAGNOSIS — E119 Type 2 diabetes mellitus without complications: Secondary | ICD-10-CM

## 2019-03-09 DIAGNOSIS — G968 Other specified disorders of central nervous system: Secondary | ICD-10-CM

## 2019-03-09 DIAGNOSIS — I10 Essential (primary) hypertension: Secondary | ICD-10-CM

## 2019-03-09 DIAGNOSIS — G9689 Other specified disorders of central nervous system: Secondary | ICD-10-CM

## 2019-03-09 NOTE — Chronic Care Management (AMB) (Signed)
  Chronic Care Management    Clinical Social Work General Note  03/09/2019 Name: [name] MRN: [id number] DOB: [birth year]  Bridget Good is a 68 y.o. year old female who is a primary care patient of [name], [place]. The CCM was consulted to assist the patient with chronic disease management and care coordination.   Review of patient status, including review of consultants reports, relevant laboratory and other test results, and collaboration with appropriate care team members and the patient's provider was performed as part of comprehensive patient evaluation and provision of chronic care management services.    I completed a joint call to the patient today with [place]. The patient requested her husband also participate in the call. HIPAA identifiers confirmed by the patient. I completed an SDOH screen (Social Determinants of Health) with the patient and her spouse. Challenges related to finances, stress, and depression were identified. The patient reports her spouse has recently retired as a Theme park manager and has stopped being as involved in a counseling practice due to patient health concerns. "Our income has changed tremendously". The patient reports her children assist as needed and declines SW assistance with resource needs.  The patient identifes increased stress and anxiety surrounding health condition and changes in her memory. The patient has a good support system and continues to engage in prayer and rely on her faith when she feels anxious. SW discussed referral to psychiatry or medication intervention for today's PH-Q score of 9. The patient reports she does have an anti-depressant prescribed which she is not actively taking. Patients spouse reports he was no aware of medication noncompliance and will "monitor" this better. The patient declines referral to psychiatry stating her spouse is a "psychotherapist" and she does not want to speak to anyone at this time. SW  communicated this information with the patients provider via secure chat.  The patient also reports having an advance directive to include both a healthcare power of attorney and living will. SW encouraged the patient to provide a copy during her next office visit for her chart. The patient stated understanding.   Follow Up Plan: No SW needs at this time. SW provided contact information in the event patient would like assistance at a later date.      Daneen Schick, BSW, CDP [facility] / [facility] Care Management Social Worker [phone]  Total time spent performing care coordination and/or care management activities with the patient by phone or face to face = 40 minutes.

## 2019-03-09 NOTE — Patient Instructions (Signed)
Visit Information  Goals Addressed      Patient Stated   . 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  . Cognitive Deficits, specifically related to short term memory loss  Nurse Case Manager Clinical Goal(s):  Marland Kitchen Over the next 30 days, patient will verbalize understanding of plan for treatment and or recommendations for brain mass.  .  Interventions:   Telephone CCM follow up with patient and spouse for initial CCM outreach for goal setting  Assessed for patient's possible cause for memory loss . Evaluation of current treatment plan related to cognitive/memory changes and patient's adherence to plan as established by provider. . Discussed plans with patient for ongoing care management follow up and provided patient with direct contact information for care management team . Provided patient with written materials related to tips for Self Health management of memory loss ([place] office to mail to patient) . Scheduled a CCM follow telephone call with patient in about 1 week  Patient Self Care Activities:  . Self administers medications as prescribed (patient has misplaced her Sertraline, her spouse will assist her with locating this medication and ensuring the patient has started the drug as directed) . Attends all scheduled provider appointments . Calls pharmacy for medication refills . Performs ADL's independently . Performs IADL's independently . Calls provider office for new concerns or questions  Initial goal documentation    . "I don't know how to use my glucometer" (pt-stated)       Current Barriers:  Marland Kitchen Knowledge Deficits related to how to correctly use freestyle glucometer  . Knowledge Deficits related to Self Health management of Diabetes  Nurse Case Manager Clinical Goal(s):  Marland Kitchen Over the next 30 days, patient will verbalize  understanding of plan for Self monitoring CBGs at home.   . Over the next 60 days, patient will verbalize checking her CBG's at least once daily and will verbalize knowing how to self manage and or call her CCM team/PCP for hypo/hyperglycemic events.   Interventions:   Telephone CCM follow up with patient and spouse for initial encounter/goal setting . Evaluation of current treatment plan related to Diabetes management and patient's adherence to plan as established by provider. . Provided education to patient re: target AIC, self monitoring of CBGs and knowing what to do in case of hypo/hyperglycemic events . Discussed plans with patient for ongoing care management follow up and provided patient with direct contact information for care management team . Provided patient with written educational materials related to disease process and nutritional recommendations for Diabetes ([facility] office to mail printed materials) . Scheduled CCM telephone follow up with patient in about 1 week   Patient Self Care Activities:  . Self administers medications as prescribed (patient has misplaced her Sertraline, her spouse will assist her with locating this medication and ensuring the patient has started the drug as directed) . Attends all scheduled provider appointments . Calls pharmacy for medication refills . Performs ADL's independently . Performs IADL's independently . Calls provider office for new concerns or questions  Initial goal documentation    . "I have some anxiety and depression" "I have misplaced my Sertraline" (pt-stated)       Current Barriers:  Marland Kitchen Knowledge Deficits related to Self Health Management for Anxiety and Depression  . Cognitive Deficits, specifically related to recent change in  memory . Financial Constraints  Nurse Case Manager Clinical Goal(s):  Marland Kitchen Over the next 30 days, patient will demonstrate improved adherence to prescribed treatment plan for symptoms of depression and  anxiety as evidenced bypatient will report taking her antidepressant exactly as prescribed and without missed doses.  Interventions:   Telephone outreach to patient and spouse today for initial CCM outreach for goal planning . Evaluation of current treatment plan related to depression and anxiety and patient's adherence to plan as established by provider. . Provided education to patient re: importance of starting Sertraline asap to help try to manage her symptoms of depression and anxiety . Discussed plans with patient for ongoing care management follow up and provided patient with direct contact information for care management team  . Scheduled a telephone CCM follow up call with patient in about a week  Patient Self Care Activities:  . Self administers medications as prescribed (patient has misplaced her Sertraline, her spouse will assist her with locating this medication and ensuring the patient has started the drug as directed) . Attends all scheduled provider appointments . Calls pharmacy for medication refills . Performs ADL's independently . Performs IADL's independently . Calls provider office for new concerns or questions  Initial goal documentation    . "I will get mychart set up asap" (pt-stated)       Current Barriers:  Marland Kitchen Knowledge Deficits related to procedure for setting up mychart . Cognitive Deficits, specifically with short-term memory loss   Nurse Case Manager Clinical Goal(s):  Marland Kitchen In the next 14 days, patient will have completed setting up her "mychart" account.   Interventions:  Telephone CCM telephone follow up with patient and her spouse for initial RN/CM encounter   . Provided patient and her spouse rationale for usage of mychart patient portal.  . Provided patient and spouse directions on how to set my patient's mychart account. . Scheduled a follow up call with patient for about 1 week.   Patient Self Care Activities:  . Self administers medications as  prescribed (patient has misplaced her Sertraline, her spouse will assist her with locating this medication and ensuring the patient has started the drug as directed) . Attends all scheduled provider appointments . Calls pharmacy for medication refills . Performs ADL's independently . Performs IADL's independently . Calls provider office for new concerns or questions  Initial goal documentation     Ms. Westerhoff was given information about Chronic Care Management services today including:  1. CCM service includes personalized support from designated clinical staff supervised by her physician, including individualized plan of care and coordination with other care providers 2. 24/7 contact phone numbers for assistance for urgent and routine care needs. 3. Service will only be billed when office clinical staff spend 20 minutes or more in a month to coordinate care. 4. Only one practitioner may furnish and bill the service in a calendar month. 5. The patient may stop CCM services at any time (effective at the end of the month) by phone call to the office staff. 6. The patient will be responsible for cost sharing (co-pay) of up to 20% of the service fee (after annual deductible is met).  Patient agreed to services and verbal consent obtained.   The patient verbalized understanding of instructions provided today and declined a print copy of patient instruction materials.   The CM team will reach out to the patient again over the next 7-14 days.   Barb Merino, RN,CCM Care Management Coordinator [facility] Care Management/[facility]  Direct Phone: [phone]

## 2019-03-09 NOTE — Patient Instructions (Signed)
Social Worker Visit Information   Materials Provided: No: Patient declined  Follow Up Plan: SW to sign off on patient case due to no current SW needs.    Daneen Schick, BSW, CDP [facility] / [facility] Care Management Social Worker [phone]

## 2019-03-09 NOTE — Chronic Care Management (AMB) (Signed)
Chronic Care Management   Initial Visit Note  03/09/2019 Name: Bridget Good MRN: [id number] DOB: 12-09-1951  Referred by: [name], FNP Reason for referral : Chronic Care Management ([facility])   Bridget Good is a 68 y.o. year old female who is a primary care patient of [name], [place]. The CCM team was consulted for assistance with chronic disease management and care coordination needs.   Review of patient status, including review of consultants reports, relevant laboratory and other test results, and collaboration with appropriate care team members and the patient's provider was performed as part of comprehensive patient evaluation and provision of chronic care management services.    I spoke with the patient and her spouse today by telephone.  Objective:  Lab Results  Component Value Date   HGBA1C 7.0 (H) 02/20/2019   HGBA1C 7.4 (H) 11/14/2018   HGBA1C 6.8 (H) 09/19/2018   Lab Results  Component Value Date   LDLCALC 97 09/19/2018   CREATININE 1.20 (H) 02/20/2019   BP Readings from Last 3 Encounters:  02/28/19 (!) 149/69  02/20/19 (!) 154/71  02/09/19 (!) 176/99    Goals Addressed      Patient Stated   . 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  . Cognitive Deficits, specifically related to short term memory loss  Nurse Case Manager Clinical Goal(s):  Marland Kitchen Over the next 30 days, patient will verbalize understanding of plan for treatment and or recommendations for brain mass.  .  Interventions:   Telephone CCM follow up with patient and spouse for initial CCM outreach for goal setting  Assessed for patient's possible cause for memory loss . Evaluation of current treatment plan related to cognitive/memory changes and patient's adherence to plan as established by provider. .  Discussed plans with patient for ongoing care management follow up and provided patient with direct contact information for care management team . Provided patient with written materials related to tips for Self Health management of memory loss ([place] office to mail to patient) . Scheduled a CCM follow telephone call with patient in about 1 week  Patient Self Care Activities:  . Self administers medications as prescribed (patient has misplaced her Sertraline, her spouse will assist her with locating this medication and ensuring the patient has started the drug as directed) . Attends all scheduled provider appointments . Calls pharmacy for medication refills . Performs ADL's independently . Performs IADL's independently . Calls provider office for new concerns or questions  Initial goal documentation    . "I don't know how to use my glucometer" (pt-stated)       Current Barriers:  Marland Kitchen Knowledge Deficits related to how to correctly use freestyle glucometer  . Knowledge Deficits related to Self Health management of Diabetes  Nurse Case Manager Clinical Goal(s):  Marland Kitchen Over the next 30 days, patient will verbalize understanding of plan for Self monitoring CBGs at home.   . Over the next 60 days, patient will verbalize checking her CBG's at least once daily and will verbalize knowing how to self manage and or call her CCM team/PCP for hypo/hyperglycemic events.   Interventions:   Telephone CCM follow up with patient and spouse for initial encounter/goal setting . Evaluation of current treatment plan related to Diabetes management and patient's adherence to plan as established by provider. Marland Kitchen  Provided education to patient re: target AIC, self monitoring of CBGs and knowing what to do in case of hypo/hyperglycemic events . Discussed plans with patient for ongoing care management follow up and provided patient with direct contact information for care management team . Provided patient with written  educational materials related to disease process and nutritional recommendations for Diabetes ([facility] office to mail printed materials) . Scheduled CCM telephone follow up with patient in about 1 week   Patient Self Care Activities:  . Self administers medications as prescribed (patient has misplaced her Sertraline, her spouse will assist her with locating this medication and ensuring the patient has started the drug as directed) . Attends all scheduled provider appointments . Calls pharmacy for medication refills . Performs ADL's independently . Performs IADL's independently . Calls provider office for new concerns or questions  Initial goal documentation    . "I have some anxiety and depression" "I have misplaced my Sertraline" (pt-stated)       Current Barriers:  Marland Kitchen Knowledge Deficits related to Self Health Management for Anxiety and Depression  . Cognitive Deficits, specifically related to recent change in memory . Financial Constraints  Nurse Case Manager Clinical Goal(s):  Marland Kitchen Over the next 30 days, patient will demonstrate improved adherence to prescribed treatment plan for symptoms of depression and anxiety as evidenced bypatient will report taking her antidepressant exactly as prescribed and without missed doses.  Interventions:   Telephone outreach to patient and spouse today for initial CCM outreach for goal planning . Evaluation of current treatment plan related to depression and anxiety and patient's adherence to plan as established by provider. . Provided education to patient re: importance of starting Sertraline asap to help try to manage her symptoms of depression and anxiety . Discussed plans with patient for ongoing care management follow up and provided patient with direct contact information for care management team  . Scheduled a telephone CCM follow up call with patient in about a week  Patient Self Care Activities:  . Self administers medications as prescribed  (patient has misplaced her Sertraline, her spouse will assist her with locating this medication and ensuring the patient has started the drug as directed) . Attends all scheduled provider appointments . Calls pharmacy for medication refills . Performs ADL's independently . Performs IADL's independently . Calls provider office for new concerns or questions  Initial goal documentation   . "I will get mychart set up asap" (pt-stated)       Current Barriers:  Marland Kitchen Knowledge Deficits related to procedure for setting up mychart . Cognitive Deficits, specifically with short-term memory loss   Nurse Case Manager Clinical Goal(s):  Marland Kitchen In the next 14 days, patient will have completed setting up her "mychart" account.   Interventions:  Telephone CCM telephone follow up with patient and her spouse for initial RN/CM encounter   . Provided patient and her spouse rationale for usage of mychart patient portal.  . Provided patient and spouse directions on how to set my patient's mychart account. . Scheduled a follow up call with patient for about 1 week.   Patient Self Care Activities:  . Self administers medications as prescribed (patient has misplaced her Sertraline, her spouse will assist her with locating this medication and ensuring the patient has started the drug as directed) . Attends all scheduled provider appointments . Calls pharmacy for medication refills . Performs ADL's independently . Performs IADL's independently . Calls provider office for new concerns or questions  Initial goal documentation  Ms. Paszkiewicz was given information about Chronic Care Management services today including:  1. CCM service includes personalized support from designated clinical staff supervised by her physician, including individualized plan of care and coordination with other care providers 2. 24/7 contact phone numbers for assistance for urgent and routine care needs. 3. Service will only be billed when  office clinical staff spend 20 minutes or more in a month to coordinate care. 4. Only one practitioner may furnish and bill the service in a calendar month. 5. The patient may stop CCM services at any time (effective at the end of the month) by phone call to the office staff. 6. The patient will be responsible for cost sharing (co-pay) of up to 20% of the service fee (after annual deductible is met).  Patient agreed to services and verbal consent obtained.   The CM team will reach out to the patient again over the next 7-14 days.   Barb Merino, RN,CCM Care Management Coordinator [facility]  Direct Phone: [phone]

## 2019-03-11 ENCOUNTER — Ambulatory Visit: Payer: PPO

## 2019-03-11 ENCOUNTER — Other Ambulatory Visit: Payer: Self-pay

## 2019-03-15 ENCOUNTER — Other Ambulatory Visit: Payer: Self-pay | Admitting: Neurology

## 2019-03-16 ENCOUNTER — Telehealth: Payer: Self-pay

## 2019-03-17 ENCOUNTER — Telehealth: Payer: Self-pay

## 2019-03-18 ENCOUNTER — Ambulatory Visit: Payer: Self-pay

## 2019-03-18 ENCOUNTER — Telehealth: Payer: Self-pay

## 2019-03-18 ENCOUNTER — Other Ambulatory Visit: Payer: Self-pay | Admitting: Radiation Therapy

## 2019-03-18 DIAGNOSIS — G968 Other specified disorders of central nervous system: Secondary | ICD-10-CM

## 2019-03-18 DIAGNOSIS — E119 Type 2 diabetes mellitus without complications: Secondary | ICD-10-CM

## 2019-03-18 DIAGNOSIS — I10 Essential (primary) hypertension: Secondary | ICD-10-CM

## 2019-03-18 DIAGNOSIS — G9689 Other specified disorders of central nervous system: Secondary | ICD-10-CM

## 2019-03-18 NOTE — Chronic Care Management (AMB) (Signed)
  Chronic Care Management   Outreach Note  03/18/2019 Name: Bridget Good MRN: [id number] DOB: 09-Nov-1951  Referred by: [name], FNP Reason for referral : Chronic Care Management (CCM TELEPHONE FOLLOW UP )   An unsuccessful telephone outreach was attempted today. The patient was referred to the case management team by [name] FNP for assistance with Diabetes Mellitus, Hypertension and CNS mass.   Follow Up Plan: The CM team will reach out to the patient again over the next 7-10 days.    Barb Merino, RN,CCM Care Management Coordinator [facility]  Direct Phone: [phone]

## 2019-03-19 ENCOUNTER — Ambulatory Visit: Payer: Self-pay | Admitting: Pharmacist

## 2019-03-19 ENCOUNTER — Other Ambulatory Visit: Payer: Self-pay

## 2019-03-19 DIAGNOSIS — I10 Essential (primary) hypertension: Secondary | ICD-10-CM

## 2019-03-19 DIAGNOSIS — E119 Type 2 diabetes mellitus without complications: Secondary | ICD-10-CM

## 2019-03-23 ENCOUNTER — Encounter: Payer: Self-pay | Admitting: Nurse Practitioner

## 2019-03-23 ENCOUNTER — Other Ambulatory Visit: Payer: Self-pay

## 2019-03-23 ENCOUNTER — Encounter: Payer: Self-pay | Admitting: Internal Medicine

## 2019-03-23 ENCOUNTER — Inpatient Hospital Stay: Payer: PPO | Attending: Internal Medicine | Admitting: Internal Medicine

## 2019-03-23 VITALS — BP 161/82 | HR 48 | Temp 98.8°F | Resp 19 | Ht 63.0 in | Wt 179.5 lb

## 2019-03-23 DIAGNOSIS — D696 Thrombocytopenia, unspecified: Secondary | ICD-10-CM | POA: Diagnosis not present

## 2019-03-23 DIAGNOSIS — R634 Abnormal weight loss: Secondary | ICD-10-CM

## 2019-03-23 DIAGNOSIS — G4762 Sleep related leg cramps: Secondary | ICD-10-CM | POA: Diagnosis not present

## 2019-03-23 DIAGNOSIS — G9389 Other specified disorders of brain: Secondary | ICD-10-CM | POA: Diagnosis not present

## 2019-03-23 DIAGNOSIS — R51 Headache: Secondary | ICD-10-CM | POA: Diagnosis not present

## 2019-03-23 DIAGNOSIS — G9689 Other specified disorders of central nervous system: Secondary | ICD-10-CM

## 2019-03-23 DIAGNOSIS — I10 Essential (primary) hypertension: Secondary | ICD-10-CM | POA: Diagnosis not present

## 2019-03-23 DIAGNOSIS — I739 Peripheral vascular disease, unspecified: Secondary | ICD-10-CM | POA: Diagnosis not present

## 2019-03-23 DIAGNOSIS — E782 Mixed hyperlipidemia: Secondary | ICD-10-CM | POA: Diagnosis not present

## 2019-03-23 DIAGNOSIS — K219 Gastro-esophageal reflux disease without esophagitis: Secondary | ICD-10-CM | POA: Diagnosis not present

## 2019-03-23 DIAGNOSIS — I2510 Atherosclerotic heart disease of native coronary artery without angina pectoris: Secondary | ICD-10-CM | POA: Diagnosis not present

## 2019-03-23 DIAGNOSIS — M899 Disorder of bone, unspecified: Secondary | ICD-10-CM | POA: Diagnosis present

## 2019-03-23 DIAGNOSIS — I499 Cardiac arrhythmia, unspecified: Secondary | ICD-10-CM | POA: Diagnosis not present

## 2019-03-23 DIAGNOSIS — E871 Hypo-osmolality and hyponatremia: Secondary | ICD-10-CM | POA: Diagnosis not present

## 2019-03-23 DIAGNOSIS — R001 Bradycardia, unspecified: Secondary | ICD-10-CM | POA: Diagnosis not present

## 2019-03-23 DIAGNOSIS — J449 Chronic obstructive pulmonary disease, unspecified: Secondary | ICD-10-CM | POA: Diagnosis not present

## 2019-03-23 DIAGNOSIS — N183 Chronic kidney disease, stage 3 (moderate): Secondary | ICD-10-CM | POA: Diagnosis not present

## 2019-03-23 DIAGNOSIS — G968 Other specified disorders of central nervous system: Secondary | ICD-10-CM

## 2019-03-23 NOTE — Progress Notes (Signed)
[facility] [place], [place] [address] [phone]   Interval Evaluation  Date of Service: 03/23/19 Patient Name: Bridget Good Patient MRN: [id number] Patient DOB: August 28, 1951 Provider: Ventura Sellers, MD  Identifying Statement:  [name] is a 68 y.o. 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 Zada Finders  Interval History:  Bridget Good presents today to review pathology from her biopsy on 3/20 with Dr. Zada Finders.  She unfortunately continues to describe daily headaches. She has not been over-using analgesia, and sleep has been normal.  She does acknowledge heavy caffeine intake (6+ cups of tea, plus additional soda).  Also, vision is blurry and she is due for eye doctor visit. Otherwise no new or progressive neurologic deficits, no seizures.  Walking is normal and independent.  No further weight loss.    H+P (12/15/18) Patient presents after referral for abnormal brain MRI.  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  There were and are no symptoms clearly associated with the bony mass.  Referral was initially placed for Dr. Walden Field given abnormal light chains found on serology.  Per patient and Dr. Walden Field, further workup has not uncovered myeloma or other related malignancy to this point.  Skeletal survey was also performed and was normal.  Still, patient has not had CT scans of the body, and no CNS imaging in more than 5 months.  She maintains good functional status, walks with a cane because of orthopedic limitations.  Double vision has resolved.  Of  related concern is that she describes greater than 100lbs unintentional weight loss over the past year.   Medications: Current Outpatient Medications on File Prior to Visit  Medication Sig Dispense Refill   acetaminophen (TYLENOL) 500 MG tablet Take 500 mg by mouth as needed for mild pain.     amLODipine-benazepril (LOTREL) 10-40 MG capsule Take 1 capsule by mouth daily.     aspirin EC 81 MG tablet Take 81 mg by mouth daily.      ciclopirox (PENLAC) 8 % solution Apply topically at bedtime. Apply over nail and surrounding skin. Apply daily over previous coat. Remove weekly with polish remover. (Patient taking differently: Apply 1 application topically at bedtime. Apply over nail and surrounding skin. Apply daily over previous coat. Remove weekly with polish remover.) 6.6 mL 11   clopidogrel (PLAVIX) 75 MG tablet Take 1 tablet (75 mg total) by mouth daily. 30 tablet 11   empagliflozin (JARDIANCE) 10 MG TABS tablet Take 10 mg by mouth daily. 90 tablet 3   Ginsengs-Royal Jelly-Vit B12 (GINSENG ROYAL JELLY PLUS PO) Take 1 capsule by mouth daily.     HYDROcodone-acetaminophen (NORCO/VICODIN) 5-325 MG tablet Take 1 tablet by mouth every 4 (four) hours as needed for moderate pain or severe pain. 30 tablet 0   ketoconazole (NIZORAL) 2 % cream Apply to both feet and between toes once daily for 6 weeks 30 g 1   metoprolol succinate (TOPROL-XL) 50 MG 24 hr tablet Take 50 mg by mouth daily. Take with or immediately following a meal.     nitroGLYCERIN (NITROSTAT) 0.4 MG SL tablet Place  1 tablet (0.4 mg total) under the tongue every 5 (five) minutes as needed for chest pain. 25 tablet 12   pantoprazole (PROTONIX) 40 MG tablet Take 1 tablet (40 mg total) by mouth daily at 12 noon. 30 tablet 12   potassium chloride (K-DUR,KLOR-CON) 10 MEQ tablet Take 20 mEq by mouth daily.     rosuvastatin (CRESTOR) 20 MG tablet Take 1 tablet (20 mg total) by mouth daily. 30 tablet 3   sertraline (ZOLOFT) 25 MG  tablet TAKE 1 TABLET BY MOUTH EVERYDAY AT BEDTIME 90 tablet 2   traZODone (DESYREL) 50 MG tablet TAKE 1 TABLET (50 MG TOTAL) BY MOUTH AT BEDTIME AS NEEDED FOR SLEEP. 90 tablet 1   valsartan-hydrochlorothiazide (DIOVAN-HCT) 320-12.5 MG tablet Take 1 tablet by mouth daily.     Vitamin D, Ergocalciferol, (DRISDOL) 50000 units CAPS capsule Take 50,000 Units by mouth every other day.      No current facility-administered medications on file prior to visit.     Allergies:  Allergies  Allergen Reactions   Sulfa Antibiotics Hives, Itching and Nausea And Vomiting   Past Medical History:  Past Medical History:  Diagnosis Date   Angina    Anxiety    Bronchitis    Depression    Diabetes mellitus    Fibromyalgia    GERD (gastroesophageal reflux disease)    Headache(784.0)    Hyperlipidemia    Hypertension    Hypertensive heart disease without CHF    Lupus ([place])    "treated for it from 1992 til 2012; dr said I don't have it anymore"   Obesity (BMI 30-39.9)    Osteoarthritis    Pneumonia    Shortness of breath    lying down, upon exertion   Shortness of breath on exertion    Stroke ([place])    2019   Past Surgical History:  Past Surgical History:  Procedure Laterality Date   ABDOMINAL HYSTERECTOMY     partial   CARDIAC CATHETERIZATION  ~ 2007   CESAREAN SECTION  1978; 1981   COLONOSCOPY     CRANIOTOMY N/A 02/27/2019   Procedure: Endonasal Endoscopic biopsy of clival mass;  Surgeon: Judith Part, MD;  Location: [facility];  Service: Neurosurgery;  Laterality: N/A;  Endonasal Endoscopic biopsy of clival mass   ENDOSCOPIC TRANS NASAL APPROACH WITH FUSION N/A 02/27/2019   Procedure: ENDOSCOPIC TRANS NASAL APPROACH WITH FUSION;  Surgeon: Judith Part, MD;  Location: [facility];  Service: Neurosurgery;  Laterality: N/A;  ENDOSCOPIC TRANS NASAL APPROACH WITH FUSION   FOOT SURGERY     "had to cut it to let the fluids out; it had swollen very badly; left foot"     Social History:  Social History   Socioeconomic History   Marital status: Married    Spouse name: Not on file   Number of children: 2   Years of education: Not on file   Highest education level: Not on file  Occupational History   Not on file  Social Needs   Financial resource strain: Somewhat hard   Food insecurity:    Worry: Sometimes true    Inability: Never true   Transportation needs:    Medical: No    Non-medical: No  Tobacco Use   Smoking status: Never Smoker   Smokeless tobacco: Never Used  Substance and Sexual Activity   Alcohol use: No    Alcohol/week: 0.0 standard drinks   Drug use: No   Sexual activity: Yes    Partners:  Male    Birth control/protection: Surgical  Lifestyle   Physical activity:    Days per week: Not on file    Minutes per session: Not on file   Stress: Rather much  Relationships   Social connections:    Talks on phone: More than three times a week    Gets together: More than three times a week    Attends religious service: More than 4 times per year    Active member of club or organization: Yes    Attends meetings of clubs or organizations: More than 4 times per year    Relationship status: Married   Intimate partner violence:    Fear of current or ex partner: No    Emotionally abused: No    Physically abused: No    Forced sexual activity: No  Other Topics Concern   Not on file  Social History Narrative   Married.  Husband is psychotherapist.  Several children.  Husband is Theme park manager of [employer] in [place].   Family History:  Family History  Problem Relation Age of Onset   Heart attack Father    Diabetes Father    Hypertension Mother    Heart attack Brother    Kidney failure Brother    Diabetes Sister    Diabetes Sister     Review of Systems: Constitutional: Denies fevers, chills or abnormal weight loss Eyes: Denies blurriness of vision Ears, nose, mouth, throat, and face: Denies mucositis  or sore throat Respiratory: Denies cough, dyspnea or wheezes Cardiovascular: Denies palpitation, chest discomfort or lower extremity swelling Gastrointestinal:  Denies nausea, constipation, diarrhea GU: Denies dysuria or incontinence Skin: Denies abnormal skin rashes Neurological: Per HPI Musculoskeletal: joint pain Behavioral/Psych: +anxiety  Physical Exam: There were no vitals filed for this visit. KPS: 80. General: Alert, cooperative, pleasant, in no acute distress Head: Normal EENT: No conjunctival injection or scleral icterus. Oral mucosa moist Lungs: Resp effort normal Cardiac: Regular rate and rhythm Abdomen: Soft, non-distended abdomen Skin: No rashes cyanosis or petechiae. Extremities: No clubbing or edema  Neurologic Exam: Mental Status: Awake, alert, attentive to examiner. Oriented to self and environment. Language is fluent with intact comprehension.  Cranial Nerves: Visual acuity is grossly normal. Visual fields are full. Extra-ocular movements intact. No ptosis. Face is symmetric, tongue midline. Motor: Tone and bulk are normal. Power is full in both arms and legs. Reflexes are symmetric, no pathologic reflexes present. Intact finger to nose bilaterally Sensory: Intact to light touch and temperature Gait: Normal and tandem gait is normal.   Labs: I have reviewed the data as listed    Component Value Date/Time   NA 140 02/20/2019 1046   NA 142 11/14/2018 1432   K 3.9 02/20/2019 1046   CL 105 02/20/2019 1046   CO2 26 02/20/2019 1046   GLUCOSE 197 (H) 02/20/2019 1046   BUN 20 02/20/2019 1046   BUN 13 11/14/2018 1432   CREATININE 1.20 (H) 02/20/2019 1046   CALCIUM 9.2 02/20/2019 1046   PROT 7.6 11/27/2018 1055   PROT 7.0 11/14/2018 1432   ALBUMIN 3.8 11/27/2018 1055   ALBUMIN 4.3 11/14/2018 1432   AST 17 11/27/2018 1055   ALT 11 11/27/2018 1055   ALKPHOS 110 11/27/2018 1055   BILITOT 1.3 (H) 11/27/2018 1055   BILITOT 0.8 11/14/2018 1432   GFRNONAA 47 (L)  02/20/2019 1046   GFRAA 55 (L) 02/20/2019 1046   Lab Results  Component Value Date   WBC 5.1 02/20/2019   NEUTROABS  3.1 11/27/2018   HGB 14.4 02/20/2019   HCT 45.5 02/20/2019   MCV 93.0 02/20/2019   PLT 307 02/20/2019   Pathology:   Assessment/Plan  1. CNS mass  Ms. Axford is clinically stable today.  We reviewed her pathology which is suggestive of sarcoidosis lesion.  She has already undergone contrast enhanced chest CT as well as skeletal survey, both of which were normal aside from the clivus lesion.  At this time we can follow radiographically treat as needed with steroids.  Next MRI brain should be performed in 9 months. No need for treatment right now.  Chronic daily headaches may be related to caffeine intake and visual impairment.  We recommended gradually drawing down caffeine intake and updating eyeglass prescription.  We appreciate the opportunity to participate in the care of ATHELENE HURSEY.  She should follow up after brain MRI in January, or sooner as needed for headache management.  All questions were answered. The patient knows to call the clinic with any problems, questions or concerns. No barriers to learning were detected.  The total time spent in the encounter was 25 minutes and more than 50% was on counseling and review of test results   Ventura Sellers, MD Medical Director of Neuro-Oncology [facility] 03/23/19 10:14 AM

## 2019-03-24 ENCOUNTER — Telehealth: Payer: Self-pay | Admitting: Internal Medicine

## 2019-03-24 NOTE — Telephone Encounter (Signed)
Scheduled appt per 4/13 los. °

## 2019-03-25 ENCOUNTER — Other Ambulatory Visit (INDEPENDENT_AMBULATORY_CARE_PROVIDER_SITE_OTHER): Payer: PPO

## 2019-03-25 ENCOUNTER — Telehealth: Payer: Self-pay

## 2019-03-25 DIAGNOSIS — F329 Major depressive disorder, single episode, unspecified: Secondary | ICD-10-CM | POA: Diagnosis not present

## 2019-03-25 DIAGNOSIS — E119 Type 2 diabetes mellitus without complications: Secondary | ICD-10-CM | POA: Diagnosis not present

## 2019-03-25 DIAGNOSIS — I10 Essential (primary) hypertension: Secondary | ICD-10-CM | POA: Diagnosis not present

## 2019-03-25 MED ORDER — EMPAGLIFLOZIN 10 MG PO TABS: 10.0000 mg | ORAL_TABLET | Freq: Every day | ORAL | 3 refills | Status: DC

## 2019-03-26 ENCOUNTER — Ambulatory Visit: Payer: Self-pay

## 2019-03-26 ENCOUNTER — Other Ambulatory Visit: Payer: Self-pay

## 2019-03-26 ENCOUNTER — Telehealth: Payer: Self-pay

## 2019-03-26 DIAGNOSIS — E119 Type 2 diabetes mellitus without complications: Secondary | ICD-10-CM

## 2019-03-26 DIAGNOSIS — F329 Major depressive disorder, single episode, unspecified: Secondary | ICD-10-CM

## 2019-03-26 DIAGNOSIS — G968 Other specified disorders of central nervous system: Secondary | ICD-10-CM

## 2019-03-26 DIAGNOSIS — I10 Essential (primary) hypertension: Secondary | ICD-10-CM

## 2019-03-26 DIAGNOSIS — F32A Depression, unspecified: Secondary | ICD-10-CM

## 2019-03-26 DIAGNOSIS — G9689 Other specified disorders of central nervous system: Secondary | ICD-10-CM

## 2019-03-27 ENCOUNTER — Other Ambulatory Visit: Payer: Self-pay | Admitting: Pharmacy Technician

## 2019-03-27 NOTE — Progress Notes (Signed)
Chronic Care Management  Visit Note  03/19/2019 Name: Bridget Good MRN: [id number] DOB: 06/08/1951  Referred by: [name], FNP Reason for referral : Chronic care management-->medication management   Bridget Good is a 68 y.o. year old female who is a primary care patient of [name], [place]. The CCM team was consulted for assistance with chronic disease management and care coordination needs.   Review of patient status, including review of consultants reports, relevant laboratory and other test results, and collaboration with appropriate care team members and the patient's provider was performed as part of comprehensive patient evaluation and provision of chronic care management services.    Objective: Lab Results  Component Value Date   CREATININE 1.20 (H) 02/20/2019   CREATININE 1.00 11/27/2018   CREATININE 0.88 11/14/2018    Lab Results  Component Value Date   HGBA1C 7.0 (H) 02/20/2019    Lipid Panel     Component Value Date/Time   CHOL 183 09/19/2018 1410   TRIG 93 09/19/2018 1410   HDL 67 09/19/2018 1410   CHOLHDL 2.7 09/19/2018 1410   CHOLHDL 3.5 07/13/2018 0516   VLDL 20 07/13/2018 0516   LDLCALC 97 09/19/2018 1410    BP Readings from Last 3 Encounters:  03/23/19 (!) 161/82  02/28/19 (!) 149/69  02/20/19 (!) 154/71    Medications Reviewed Today    Reviewed by Wilmon Arms, RN (Registered Nurse) on 03/23/19 at [place] List Status: <None>  Medication Order Taking? Sig Documenting Provider Last Dose Status Informant  acetaminophen (TYLENOL) 500 MG tablet [id number]  Take 500 mg by mouth as needed for mild pain. [provider]  Active Self  amLODipine-benazepril (LOTREL) 10-40 MG capsule [id number]  Take 1 capsule by mouth daily. [provider]  Active Self  aspirin EC 81 MG tablet [id number]  Take 81 mg by mouth daily.  [provider]  Active Self  ciclopirox (PENLAC) 8 % solution [id number]  Apply topically at  bedtime. Apply over nail and surrounding skin. Apply daily over previous coat. Remove weekly with polish remover.  Patient taking differently:  Apply 1 application topically at bedtime. Apply over nail and surrounding skin. Apply daily over previous coat. Remove weekly with polish remover.   Marzetta Board, DPM  Active Self  clopidogrel (PLAVIX) 75 MG tablet [id number]  Take 1 tablet (75 mg total) by mouth daily. Edwin Dada, MD  Active Self  empagliflozin (JARDIANCE) 10 MG TABS tablet [id number]  Take 10 mg by mouth daily. [name], FNP  Active   Ginsengs-Royal Jelly-Vit B12 [facility] JELLY PLUS PO) [id number]  Take 1 capsule by mouth daily. [provider]  Active Self  HYDROcodone-acetaminophen (NORCO/VICODIN) 5-325 MG tablet [id number]  Take 1 tablet by mouth every 4 (four) hours as needed for moderate pain or severe pain. Kristeen Miss, MD  Active   ketoconazole (NIZORAL) 2 % cream [id number]  Apply to both feet and between toes once daily for 6 weeks Marzetta Board, DPM  Active Self  metoprolol succinate (TOPROL-XL) 50 MG 24 hr tablet [id number]  Take 50 mg by mouth daily. Take with or immediately following a meal. [provider]  Active Self  nitroGLYCERIN (NITROSTAT) 0.4 MG SL tablet [id number]  Place 1 tablet (0.4 mg total) under the tongue every 5 (five) minutes as needed for chest pain. Jacolyn Reedy, MD  Active Self  pantoprazole (PROTONIX) 40 MG tablet [id number]  Take 1 tablet (40 mg total) by mouth daily  at 12 noon. Isla Pence, MD  Active Self  potassium chloride (K-DUR,KLOR-CON) 10 MEQ tablet [id number]  Take 20 mEq by mouth daily. [provider]  Active Self  rosuvastatin (CRESTOR) 20 MG tablet [id number]  Take 1 tablet (20 mg total) by mouth daily. Edwin Dada, MD  Active Self  sertraline (ZOLOFT) 25 MG tablet [id number]  TAKE 1 TABLET BY MOUTH EVERYDAY AT BEDTIME Dohmeier, Asencion Partridge, MD  Active   traZODone (DESYREL) 50  MG tablet [id number]  TAKE 1 TABLET (50 MG TOTAL) BY MOUTH AT BEDTIME AS NEEDED FOR SLEEP. Dohmeier, Asencion Partridge, MD  Active   valsartan-hydrochlorothiazide (DIOVAN-HCT) 320-12.5 MG tablet [id number]  Take 1 tablet by mouth daily. [provider]  Active Self  Vitamin D, Ergocalciferol, (DRISDOL) 50000 units CAPS capsule [id number]  Take 50,000 Units by mouth every other day.  [provider]  Active Self         Goals Addressed            This Visit's Progress   . I would like to continue to control my BGs and keep my A1c<7% (pt-stated)       Current Barriers:   Non Adherence to prescribed medication regimen-patient loses track of which medications she has taken throughout the day  Patient's current BG meter is malfunctioning-->requested new meter  Pharmacist Clinical Goal(s):   Over the next 14 days, patient will obtain free BG meter through insurance: FreeStyle, Precision or One Touch meters are preferred through [employer] (patient has new meter-CCM RN to review usage)  Interventions:  Discussed plans with patient for ongoing care management follow up and provided patient with direct contact information for care management team  Collaboration with provider re: medication management-->request for BG meter/supplies sent 03/19/19    . I would like to organize my medications in order to have better adherence. (pt-stated)       Current Barriers:  . Non Adherence to prescribed medication regimen-->patient would like pill box to better organize medication.  She continues to have memory problems.  Her children are   Pharmacist Clinical Goal(s):  Marland Kitchen Over the next 30 days, patient will demonstrate Improved medication adherence as evidenced by use of weekly pill box.  . Patient to discuss pill packs with family to determine best adherence/memory strategy.  Will work to transition patient to pill packs ([facility],free packaging & delivery)  Interventions: .  Comprehensive medication review performed.  Patient Self Care Activities:  . Attends all scheduled provider appointments . Calls pharmacy for medication refills  . Discussed Jardiance and pill packs with provider  Please see past updates related to this goal by clicking on the "Past Updates" button in the selected goal    . I would like to reduce my copay for Jardiance (pt-stated)       Current Barriers:   Financial stress-copay for Jardiance  Pharmacist Clinical Goal(s):   Over the next 14 days, we will apply for patient assistance with [employer] [place]).  Based on review, patient will qualify. Patient picked up another month of Jardiance samples 03/25/19.  Interventions:  Patient assistance application mailed to patient and sent to provider (received)   Patient did not received PAP packet.  I will re-mail this to her at her physical address: [address], [place]      Plan: -Considering pill packs to assist with adherence/memory issues-->patient to discuss with family, will follow up next week -Jardiance samples obtained from PCP (8-month), re-sent patient  application for Jardiance assistance program   Regina Eck, PharmD, BCPS Clinical Pharmacist, [facility] [place]: [phone]

## 2019-03-27 NOTE — Patient Instructions (Signed)
Visit Information  Goals Addressed            This Visit's Progress   . I would like to continue to control my BGs and keep my A1c<7% (pt-stated)       Current Barriers:   Non Adherence to prescribed medication regimen-patient loses track of which medications she has taken throughout the day  Patient's current BG meter is malfunctioning-->requested new meter  Pharmacist Clinical Goal(s):   Over the next 14 days, patient will obtain free BG meter through insurance: FreeStyle, Precision or One Touch meters are preferred through [employer]  Interventions:  Discussed plans with patient for ongoing care management follow up and provided patient with direct contact information for care management team  Collaboration with provider re: medication management-->request for BG meter/supplies sent 03/19/19  Initial goal documentation     . I would like to organize my medications in order to have better adherence. (pt-stated)       Current Barriers:  . Non Adherence to prescribed medication regimen-->patient would like pill box to better organize medication.  She continues to have memory problems.  Her children are   Pharmacist Clinical Goal(s):  Marland Kitchen Over the next 30 days, patient will demonstrate Improved medication adherence as evidenced by use of weekly pill box.  . Patient to discuss pill packs with family to determine best adherence/memory strategy.  Will work to transition patient to pill packs ([facility],free packaging & delivery)  Interventions: . Comprehensive medication review performed.  Patient Self Care Activities:  . Attends all scheduled provider appointments . Calls pharmacy for medication refills  . Discussed Jardiance and pill packs with provider  Please see past updates related to this goal by clicking on the "Past Updates" button in the selected goal      . I would like to reduce my copay for Jardiance (pt-stated)       Current Barriers:    Financial stress-copay for Jardiance  Pharmacist Clinical Goal(s):   Over the next 14 days, we will apply for patient assistance with [employer] of [place]).  Based on review, patient will qualify.  Patient did not received PAP packet.  I will re-mail this to her at her physical address: [place], [place]  Provider portion of application received.  Patient has another month of Jardiance samples that she picked up on 03/25/19  Interventions:  Patient assistance application mailed to patient and sent to provider.         The patient verbalized understanding of instructions provided today and declined a print copy of patient instruction materials.   The CM team will reach out to the patient again over the next 7 days.    Regina Eck, PharmD, BCPS Clinical Pharmacist, [facility] [place]: [phone]

## 2019-03-27 NOTE — Chronic Care Management (AMB) (Signed)
Chronic Care Management   Follow Up Note   03/26/2019 Name: Bridget Good MRN: [id number] DOB: 09/26/1951  Referred by: [name], FNP Reason for referral : Chronic Care Management (CCM TELEPHONE FOLLOW UP )   Bridget Good is a 68 y.o. year old female who is a primary care patient of [name], [place]. The CCM team was consulted for assistance with chronic disease management and care coordination needs.    Review of patient status, including review of consultants reports, relevant laboratory and other test results, and collaboration with appropriate care team members and the patient's provider was performed as part of comprehensive patient evaluation and provision of chronic care management services.    A collaborative call was completed with the patient and embedded Pharm D [name] today.   Goals Addressed      Patient Stated   . 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  . Cognitive Deficits, specifically related to short term memory loss  Nurse Case Manager Clinical Goal(s):  Marland Kitchen Over the next 30 days, patient will verbalize understanding of plan for treatment and or recommendations for brain mass.  .  Interventions:   Telephone CCM follow up completed with patient   Assessed for patient's possible cause for memory loss (brain mass was negative for malignancy) . Evaluation of current treatment plan related to cognitive/memory changes and patient's adherence to plan as established by provider . Verbal education provided to patient related to new diagnosis of brain Sarcoidosis . Plan to provide patient/family with printed educational materials to review via email (test email sent) . Discussed plans with patient for ongoing care management follow up and provided patient with direct contact information  for care management team . Provided patient with written materials related to tips for Self Health management of memory loss ([place] office to mail to patient) . Scheduled a CCM follow telephone call with patient in about 1-2 weeks  Patient Self Care Activities:  . Self administers medications with help from family (Pharm D referral sent) . Attends all scheduled provider appointments . Calls pharmacy for medication refills . Performs ADL's independently . Performs IADL's independently . Calls provider office for new concerns or questions  Please see past updates related to this goal by clicking on the "Past Updates" button in the selected goal     . "I don't know how to use my glucometer" (pt-stated)   Not on track    Current Barriers:  Marland Kitchen Knowledge Deficits related to how to correctly use freestyle glucometer  . Knowledge Deficits related to Self Health management of Diabetes  Nurse Case Manager Clinical Goal(s):  Marland Kitchen Over the next 30 days, patient will verbalize understanding of plan for Self monitoring CBGs at home.   . Over the next 60 days, patient will verbalize checking her CBG's at least once daily and will verbalize knowing how to self manage and or call her CCM team/PCP for hypo/hyperglycemic events.   Interventions:  . Telephone CCM follow up call completed with patient  . Provided patient with the website address for a freestyle tutorial video  . Patient recorded the website and will share with her family so they can review how to use her glucometer . Instructed patient on the importance of monitoring her CBG's at home . Instructed patient to start monitoring  her CBG's as soon as able to review at next call . Scheduled CCM telephone follow up with patient for 1-2 weeks    Patient Self Care Activities:  . Self administers medications with assistance from her family . Attends all scheduled provider appointments . Calls pharmacy for medication refills . Performs ADL's  independently . Performs IADL's independently . Calls provider office for new concerns or questions  Please see past updates related to this goal by clicking on the "Past Updates" button in the selected goal     . "I have some anxiety and depression" "I have misplaced my Sertraline" (pt-stated)   On track    Current Barriers:  Marland Kitchen Knowledge Deficits related to Self Health Management for Anxiety and Depression  . Cognitive Deficits, specifically related to recent change in memory . Financial Constraints  Nurse Case Manager Clinical Goal(s):  Marland Kitchen Over the next 30 days, patient will demonstrate improved adherence to prescribed treatment plan for symptoms of depression and anxiety as evidenced bypatient will report taking her antidepressant exactly as prescribed and without missed doses.  Interventions:  . Telephone outreach to patient completed with patient  . Evaluation of current treatment plan related to depression and anxiety and patient's adherence to plan as established by provider . Medication review completed with patient and [name] PharmD, patient confirmed having started her Sertraline, although, pt is having difficulty with managing her medications; family is assisting; embedded Pharm D is working with pt to establish pill pack system  . Discussed plans with patient for ongoing care management follow up and provided patient with direct contact information for care management team  . Scheduled a telephone CCM follow up call with patient in 1-2 weeks  Patient Self Care Activities:  . Self administers medications with assistance from her family . Attends all scheduled provider appointments . Calls pharmacy for medication refills . Performs ADL's independently . Performs IADL's independently . Calls provider office for new concerns or questions  Please see past updates related to this goal by clicking on the "Past Updates" button in the selected goal     . COMPLETED: "I will get  mychart set up asap" (pt-stated)       Current Barriers:  Marland Kitchen Knowledge Deficits related to procedure for setting up mychart . Cognitive Deficits, specifically with short-term memory loss   Nurse Case Manager Clinical Goal(s):  Marland Kitchen In the next 14 days, patient will have completed setting up her "mychart" account.   Interventions: . Telephone CCM telephone follow up with patient  . Patient is unsure if her "my chart" has been set up . Noted my chart is active, pt advised . Educated patient on use of my chart to review medical updates, communicate with PCP and or receive educational videos, etc.  . Goal completed  Patient Self Care Activities:  . Self administers medications as prescribed (patient has misplaced her Sertraline, her spouse will assist her with locating this medication and ensuring the patient has started the drug as directed) . Attends all scheduled provider appointments . Calls pharmacy for medication refills . Performs ADL's independently . Performs IADL's independently . Calls provider office for new concerns or questions  Please see past updates related to this goal by clicking on the "Past Updates" button in the selected goal         The CM team will reach out to the patient again over the next 7-14 days.    Barb Merino, RN,CCM Care Management Coordinator [facility]  Direct Phone: [phone]

## 2019-03-27 NOTE — Patient Instructions (Signed)
Visit Information  Goals Addressed      Patient Stated   . 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  . Cognitive Deficits, specifically related to short term memory loss  Nurse Case Manager Clinical Goal(s):  Marland Kitchen Over the next 30 days, patient will verbalize understanding of plan for treatment and or recommendations for brain mass.  .  Interventions:   Telephone CCM follow up completed with patient   Assessed for patient's possible cause for memory loss (brain mass was negative for malignancy) . Evaluation of current treatment plan related to cognitive/memory changes and patient's adherence to plan as established by provider . Verbal education provided to patient related to new diagnosis of brain Sarcoidosis . Plan to provide patient/family with printed educational materials to review via email (test email sent) . Discussed plans with patient for ongoing care management follow up and provided patient with direct contact information for care management team . Provided patient with written materials related to tips for Self Health management of memory loss ([place] office to mail to patient) . Scheduled a CCM follow telephone call with patient in about 1-2 weeks  Patient Self Care Activities:  . Self administers medications with help from family (Pharm D referral sent) . Attends all scheduled provider appointments . Calls pharmacy for medication refills . Performs ADL's independently . Performs IADL's independently . Calls provider office for new concerns or questions  Please see past updates related to this goal by clicking on the "Past Updates" button in the selected goal      . "I don't know how to use my glucometer" (pt-stated)   Not on track    Current Barriers:  Marland Kitchen Knowledge Deficits related to how to correctly use  freestyle glucometer  . Knowledge Deficits related to Self Health management of Diabetes  Nurse Case Manager Clinical Goal(s):  Marland Kitchen Over the next 30 days, patient will verbalize understanding of plan for Self monitoring CBGs at home.   . Over the next 60 days, patient will verbalize checking her CBG's at least once daily and will verbalize knowing how to self manage and or call her CCM team/PCP for hypo/hyperglycemic events.   Interventions:  . Telephone CCM follow up call completed with patient  . Provided patient with the website address for a freestyle tutorial video  . Patient recorded the website and will share with her family so they can review how to use her glucometer . Instructed patient on the importance of monitoring her CBG's at home . Instructed patient to start monitoring her CBG's as soon as able to review at next call . Scheduled CCM telephone follow up with patient for 1-2 weeks    Patient Self Care Activities:  . Self administers medications with assistance from her family . Attends all scheduled provider appointments . Calls pharmacy for medication refills . Performs ADL's independently . Performs IADL's independently . Calls provider office for new concerns or questions  Please see past updates related to this goal by clicking on the "Past Updates" button in the selected goal     . "I have some anxiety and depression" "I have misplaced my Sertraline" (pt-stated)   On track    Current Barriers:  Marland Kitchen Knowledge Deficits related to Self Health Management for Anxiety and Depression  . Cognitive Deficits, specifically related to  recent change in memory . Financial Constraints  Nurse Case Manager Clinical Goal(s):  Marland Kitchen Over the next 30 days, patient will demonstrate improved adherence to prescribed treatment plan for symptoms of depression and anxiety as evidenced bypatient will report taking her antidepressant exactly as prescribed and without missed doses.  Interventions:   . Telephone outreach to patient completed with patient  . Evaluation of current treatment plan related to depression and anxiety and patient's adherence to plan as established by provider . Medication review completed with patient and [name] PharmD, patient confirmed having started her Sertraline, although, pt is having difficulty with managing her medications; family is assisting; embedded Pharm D is working with pt to establish pill pack system  . Discussed plans with patient for ongoing care management follow up and provided patient with direct contact information for care management team  . Scheduled a telephone CCM follow up call with patient in 1-2 weeks  Patient Self Care Activities:  . Self administers medications with assistance from her family . Attends all scheduled provider appointments . Calls pharmacy for medication refills . Performs ADL's independently . Performs IADL's independently . Calls provider office for new concerns or questions  Please see past updates related to this goal by clicking on the "Past Updates" button in the selected goal       . COMPLETED: "I will get mychart set up asap" (pt-stated)       Current Barriers:  Marland Kitchen Knowledge Deficits related to procedure for setting up mychart . Cognitive Deficits, specifically with short-term memory loss   Nurse Case Manager Clinical Goal(s):  Marland Kitchen In the next 14 days, patient will have completed setting up her "mychart" account.   Interventions: . Telephone CCM telephone follow up with patient  . Patient is unsure if her "my chart" has been set up . Noted my chart is active, pt advised . Educated patient on use of my chart to review medical updates, communicate with PCP and or receive educational videos, etc.  . Goal completed  Patient Self Care Activities:  . Self administers medications as prescribed (patient has misplaced her Sertraline, her spouse will assist her with locating this medication and ensuring  the patient has started the drug as directed) . Attends all scheduled provider appointments . Calls pharmacy for medication refills . Performs ADL's independently . Performs IADL's independently . Calls provider office for new concerns or questions  Please see past updates related to this goal by clicking on the "Past Updates" button in the selected goal        The patient verbalized understanding of instructions provided today and declined a print copy of patient instruction materials.   The CM team will reach out to the patient again over the next 7-14 days.   Barb Merino, RN,CCM Care Management Coordinator [facility] [place] [facility]  Direct Phone: [phone]

## 2019-03-27 NOTE — Patient Outreach (Signed)
[place] [facility]) Care Management  03/27/2019  TENIQUA MARRON 1950-12-14 [id number]   Informed by [facility] Embedded RPh [name] that patient has not received patient assistance application for Jardiance. Prepared another application to be mailed to patient physical address vs PO [address].  Will follow up with patient in 7-10 business days to confirm application has been received.  Maud Deed Chana Bode [place] Certified Pharmacy Technician [place] Management Direct [phone]

## 2019-04-01 ENCOUNTER — Encounter: Payer: Self-pay | Admitting: Nurse Practitioner

## 2019-04-03 ENCOUNTER — Telehealth: Payer: Self-pay

## 2019-04-06 ENCOUNTER — Ambulatory Visit: Payer: Self-pay | Admitting: Pharmacist

## 2019-04-06 DIAGNOSIS — E119 Type 2 diabetes mellitus without complications: Secondary | ICD-10-CM

## 2019-04-06 DIAGNOSIS — I10 Essential (primary) hypertension: Secondary | ICD-10-CM

## 2019-04-07 NOTE — Progress Notes (Signed)
  Chronic Care Management   Outreach Note  04/07/2019 Name: Bridget Good MRN: [id number] DOB: 1951-10-30  Referred by: [name], FNP Reason for referral : Chronic Care Management   An unsuccessful telephone outreach was attempted today. The patient was referred to the case management team by for assistance with medication management/assistance  Follow Up Plan: The CM team will reach out to the patient again over the next 5 days.    Regina Eck, PharmD, BCPS Clinical Pharmacist, [facility] [place]: [phone]

## 2019-04-10 ENCOUNTER — Encounter: Payer: Self-pay | Admitting: Nurse Practitioner

## 2019-04-10 ENCOUNTER — Telehealth: Payer: Self-pay

## 2019-04-13 ENCOUNTER — Other Ambulatory Visit: Payer: Self-pay | Admitting: *Deleted

## 2019-04-13 ENCOUNTER — Ambulatory Visit (INDEPENDENT_AMBULATORY_CARE_PROVIDER_SITE_OTHER): Payer: PPO

## 2019-04-13 ENCOUNTER — Other Ambulatory Visit: Payer: Self-pay

## 2019-04-13 ENCOUNTER — Other Ambulatory Visit: Payer: Self-pay | Admitting: Nurse Practitioner

## 2019-04-13 ENCOUNTER — Ambulatory Visit: Payer: Self-pay | Admitting: Pharmacist

## 2019-04-13 ENCOUNTER — Telehealth: Payer: Self-pay

## 2019-04-13 DIAGNOSIS — F32A Depression, unspecified: Secondary | ICD-10-CM

## 2019-04-13 DIAGNOSIS — G968 Other specified disorders of central nervous system: Secondary | ICD-10-CM

## 2019-04-13 DIAGNOSIS — E119 Type 2 diabetes mellitus without complications: Secondary | ICD-10-CM

## 2019-04-13 DIAGNOSIS — G3184 Mild cognitive impairment, so stated: Secondary | ICD-10-CM

## 2019-04-13 DIAGNOSIS — F329 Major depressive disorder, single episode, unspecified: Secondary | ICD-10-CM | POA: Diagnosis not present

## 2019-04-13 DIAGNOSIS — I10 Essential (primary) hypertension: Secondary | ICD-10-CM

## 2019-04-13 DIAGNOSIS — G9689 Other specified disorders of central nervous system: Secondary | ICD-10-CM

## 2019-04-13 NOTE — Chronic Care Management (AMB) (Signed)
  Chronic Care Management   Outreach Note  04/13/2019 Name: Bridget Good MRN: [id number] DOB: 1951/03/11  Referred by: [name], FNP Reason for referral : Chronic Care Management (CCM TELEPHONE FOLLOW UP )   An unsuccessful telephone outreach was attempted today. The patient was referred to the case management team by [name] FNP for assistance with chronic care management and care coordination. [name] states she is eating lunch with her husband but will give me a call back in about 20 minutes.   Follow Up Plan: Telephone follow up appointment with CCM team member scheduled for: 5-7 days  Barb Merino, [facility] Care Management Coordinator [place] [facility]  Direct Phone: [phone]

## 2019-04-14 NOTE — Patient Instructions (Signed)
Visit Information  Goals Addressed      Patient Stated   . 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  . Cognitive Deficits, specifically related to short term memory loss  Nurse Case Manager Clinical Goal(s):  Marland Kitchen Over the next 30 days, patient will verbalize understanding of plan for treatment and or recommendations for brain mass.  .  Interventions:   Telephone CCM follow up completed with patient  . Assessed for new or worsening s/s of memory loss and or other cognitive decline . Assessed for receipt and review and understanding of mailed patient education materials for new diagnosis, brain sarcoidosis-pt received but is still reviewing, denies questions . Discussed plans with patient for ongoing care management follow up and provided patient with direct contact information for care management team . Scheduled a CCM follow telephone call with patient in about 1-2 weeks  Patient Self Care Activities:  . Self administers medications with help from family (Pharm D referral sent) . Attends all scheduled provider appointments . Calls pharmacy for medication refills . Performs ADL's independently . Performs IADL's independently . Calls provider office for new concerns or questions  Please see past updates related to this goal by clicking on the "Past Updates" button in the selected goal     . "I don't know how to use my glucometer" (pt-stated)   Not on track    Current Barriers:  Marland Kitchen Knowledge Deficits related to how to correctly use freestyle glucometer  . Knowledge Deficits related to Self Health management of Diabetes  Nurse Case Manager Clinical Goal(s):  Marland Kitchen Over the next 30 days, patient will verbalize understanding of plan for Self monitoring CBGs at home.   . Over the next 60 days, patient will verbalize checking her  CBG's at least once daily and will verbalize knowing how to self manage and or call her CCM team/PCP for hypo/hyperglycemic events.   Interventions:  . Completed CCM telephone follow up with patient . Assessed for patient's comfort level and ability to use free style glucometer since last follow up (pt is still unable to comfortably use her glucometer, she is not monitoring her CBGs) . Verbal education provided related to the importance of being able to monitor CBG's regularly and and or if experiencing hypo or hyperglycemic events  . Discussed having a home health nurse referred to assist with teaching of use of glucometer in the home-pt agreed . Sent secure message to provider [name], FNP requesting a [facility] SNV to assist with diabetes education, specifically for use of the glucometer-referral approved . Explained HHA process to patient and advised she will receive a call from the HHA to schedule a home visit once approved-pt agreed . Assessed for receipt and understanding of mailed patient education materials related meal planning, knowing your A1C and s/s of hypo/hyperglycemia-pt received but is still reviewing, she denies questions at this time . Scheduled CCM telephone follow up with patient for 1-2 weeks    Patient Self Care Activities:  . Self administers medications with assistance from her family . Attends all scheduled provider appointments . Calls pharmacy for medication refills . Performs ADL's independently . Performs IADL's independently . Calls provider office for new concerns or questions  Please see past updates related to this goal by clicking on the "Past Updates" button in the selected  goal     . "I have some anxiety and depression" "I have misplaced my Sertraline" (pt-stated)   Not on track    Current Barriers:  Marland Kitchen Knowledge Deficits related to Self Health Management for Anxiety and Depression  . Cognitive Deficits, specifically related to recent change in  memory . Financial Constraints  Nurse Case Manager Clinical Goal(s):  Marland Kitchen Over the next 30 days, patient will demonstrate improved adherence to prescribed treatment plan for symptoms of depression and anxiety as evidenced bypatient will report taking her antidepressant exactly as prescribed and without missed doses.  Interventions:  . Completed CCM Telephone follow up with patient . Evaluation of current treatment plan related to depression and anxiety and patient's adherence to plan as established by provider . Medication review completed with patient and [name] PharmD, patient confirmed having started her Sertraline, although, pt admits she is only taking the Sertraline "sometimes" and continues to have difficulty with managing her medications; her husband and adult children are assisting; verbal education provided to patient regarding the importance of taking the Sertraline exactly as prescribed in order to achieve the desired effectiveness from this medication for anxiety  . Discussed with patient having a home health nurse referred to further assist in person, pt agreed . Explained process to patient and advised once ordered by provider the HHA will call her to schedule a home visit, pt verbalizes understanding . Sent secure request to provider [name], FNP requesting a home health SNV to assist with medication management-referral approved  . Discussed plans with patient for ongoing care management follow up and provided patient with direct contact information for care management team  . Scheduled a telephone CCM follow up call with patient in 1-2 weeks  Patient Self Care Activities:  . Self administers medications with assistance from her family . Attends all scheduled provider appointments . Calls pharmacy for medication refills . Performs ADL's independently . Performs IADL's independently . Calls provider office for new concerns or questions  Please see past updates related  to this goal by clicking on the "Past Updates" button in the selected goal        The patient verbalized understanding of instructions provided today and declined a print copy of patient instruction materials.   The CCM team will reach out to the patient again over the next 7-10 days.  Follow up with provider re: referral for [place], RN,CCM Care Management Coordinator [place] [facility]  Direct Phone: [phone]

## 2019-04-14 NOTE — Chronic Care Management (AMB) (Signed)
Chronic Care Management   Follow Up Note   04/13/2019 Name: Bridget Good MRN: [id number] DOB: 13-Aug-1951  Referred by: [name], FNP Reason for referral : Chronic Care Management (CCM TELEPHONE FOLLOW UP )   Bridget Good is a 68 y.o. year old female who is a primary care patient of [name], [place]. The CCM team was consulted for assistance with chronic disease management and care coordination needs.    Review of patient status, including review of consultants reports, relevant laboratory and other test results, and collaboration with appropriate care team members and the patient's provider was performed as part of comprehensive patient evaluation and provision of chronic care management services.    A collaborative call was completed with Bridget Good today by embedded Francoise Ceo and myself.   Goals Addressed      Patient Stated   . 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  . Cognitive Deficits, specifically related to short term memory loss  Nurse Case Manager Clinical Goal(s):  Marland Kitchen Over the next 30 days, patient will verbalize understanding of plan for treatment and or recommendations for brain mass.  .  Interventions:   Telephone CCM follow up completed with patient  . Assessed for new or worsening s/s of memory loss and or other cognitive decline . Assessed for receipt and review and understanding of mailed patient education materials for new diagnosis, brain sarcoidosis-pt received but is still reviewing, denies questions . Discussed plans with patient for ongoing care management follow up and provided patient with direct contact information for care management team . Scheduled a CCM follow telephone call with patient in about 1-2 weeks  Patient Self Care Activities:  . Self administers  medications with help from family (Pharm D referral sent) . Attends all scheduled provider appointments . Calls pharmacy for medication refills . Performs ADL's independently . Performs IADL's independently . Calls provider office for new concerns or questions  Please see past updates related to this goal by clicking on the "Past Updates" button in the selected goal      . "I don't know how to use my glucometer" (pt-stated)   Not on track    Current Barriers:  Marland Kitchen Knowledge Deficits related to how to correctly use freestyle glucometer  . Knowledge Deficits related to Self Health management of Diabetes  Nurse Case Manager Clinical Goal(s):  Marland Kitchen Over the next 30 days, patient will verbalize understanding of plan for Self monitoring CBGs at home.   . Over the next 60 days, patient will verbalize checking her CBG's at least once daily and will verbalize knowing how to self manage and or call her CCM team/PCP for hypo/hyperglycemic events.   Interventions:  . Completed CCM telephone follow up with patient . Assessed for patient's comfort level and ability to use free style glucometer since last follow up (pt is still unable to comfortably use her glucometer, she is not monitoring her CBGs) . Verbal education provided related to the importance of being able to monitor CBG's regularly and and or if experiencing hypo or hyperglycemic events  . Discussed having a home health nurse referred to assist with teaching of use of glucometer in the home-pt agreed . Sent secure message to provider [name], FNP requesting a [facility] SNV to assist with diabetes education, specifically  for use of the glucometer-referral approved . Explained HHA process to patient and advised she will receive a call from the HHA to schedule a home visit once approved-pt agreed . Assessed for receipt and understanding of mailed patient education materials related meal planning, knowing your A1C and s/s of hypo/hyperglycemia-pt  received but is still reviewing, she denies questions at this time . Scheduled CCM telephone follow up with patient for 1-2 weeks    Patient Self Care Activities:  . Self administers medications with assistance from her family . Attends all scheduled provider appointments . Calls pharmacy for medication refills . Performs ADL's independently . Performs IADL's independently . Calls provider office for new concerns or questions  Please see past updates related to this goal by clicking on the "Past Updates" button in the selected goal      . "I have some anxiety and depression" "I have misplaced my Sertraline" (pt-stated)   Not on track    Current Barriers:  Marland Kitchen Knowledge Deficits related to Self Health Management for Anxiety and Depression  . Cognitive Deficits, specifically related to recent change in memory . Financial Constraints  Nurse Case Manager Clinical Goal(s):  Marland Kitchen Over the next 30 days, patient will demonstrate improved adherence to prescribed treatment plan for symptoms of depression and anxiety as evidenced bypatient will report taking her antidepressant exactly as prescribed and without missed doses.  Interventions:  . Completed CCM Telephone follow up with patient . Evaluation of current treatment plan related to depression and anxiety and patient's adherence to plan as established by provider . Medication review completed with patient and Bridget Good PharmD, patient confirmed having started her Sertraline, although, pt admits she is only taking the Sertraline "sometimes" and continues to have difficulty with managing her medications; her husband and adult children are assisting; verbal education provided to patient regarding the importance of taking the Sertraline exactly as prescribed in order to achieve the desired effectiveness from this medication for anxiety  . Discussed with patient having a home health nurse referred to further assist in person, pt agreed . Explained  process to patient and advised once ordered by provider the HHA will call her to schedule a home visit, pt verbalizes understanding . Sent secure request to provider [name], FNP requesting a home health SNV to assist with medication management-referral approved  . Discussed plans with patient for ongoing care management follow up and provided patient with direct contact information for care management team  . Scheduled a telephone CCM follow up call with patient in 1-2 weeks  Patient Self Care Activities:  . Self administers medications with assistance from her family . Attends all scheduled provider appointments . Calls pharmacy for medication refills . Performs ADL's independently . Performs IADL's independently . Calls provider office for new concerns or questions  Please see past updates related to this goal by clicking on the "Past Updates" button in the selected goal         The CCM team will reach out to the patient again over the next 7-10 days.  Follow up with provider re: referral for [place], RN,CCM Care Management Coordinator [place] [facility]  Direct Phone: [phone]

## 2019-04-15 NOTE — Patient Instructions (Signed)
Visit Information  Goals Addressed            This Visit's Progress     Patient Stated   . I would like to continue to control my BGs and keep my A1c<7% (pt-stated)       Current Barriers:   Patient's current BG meter is malfunctioning-->requested new meter (completed)  Unfamiliar with new BG meter  Pharmacist Clinical Goal(s):   Over the next 14 days, patient will obtain free BG meter through insurance: FreeStyle, Precision or One Touch meters are preferred through HTA (completed)  Over the next 30 days, patient will continue to check BG and maintain FBG<130.  She will work with husband to become familiar with new glucometer  Interventions:  Discussed plans with patient for ongoing care management follow up and provided patient with direct contact information for care management team  Collaboration with provider re: medication management-->request for BG meter/supplies (complete 03/19/19)  Counseled patient to continue taking Jardiance as prescribed.  Please see past updates related to this goal by clicking on the "Past Updates" button in the selected goal      . I would like to organize my medications in order to have better adherence. (pt-stated)       Current Barriers:  . Non Adherence to prescribed medication regimen-->patient would like pill box to better organize medication.  She continues to have memory problems.  Her husband is not assisting patient with filling of weekly pill box   Pharmacist Clinical Goal(s):  Marland Kitchen Over the next 30 days, patient will demonstrate Improved medication adherence as evidenced by use of weekly pill box.   Interventions: . Comprehensive medication review performed.  . Patient to discuss pill packs with family to determine best adherence/memory strategy. Will focus on husband filling pill box at this time.  This method seems to work for patient.  Obtaining Jardiance via manufacturer would inhibit it from being packed in compliance packaging  (additional confusion).  Consider compliance packaging if pill box method fails.  New weekly pill box (AM/PM slot only) delivered to patient's home.  . Counseled patient on importance of husband's involvement in filling of pill box  Patient Self Care Activities:  . Attends all scheduled provider appointments . Calls pharmacy for medication refills   Please see past updates related to this goal by clicking on the "Past Updates" button in the selected goal      . I would like to reduce my copay for Jardiance (pt-stated)       Current Barriers:   Financial stress-copay for Jardiance  Pharmacist Clinical Goal(s):  Over the next 14 days, we will apply for patient assistance with [employer] of [place]).  Based on review, patient will qualify.   Interventions:  Patient did not receive PAP packet. Delivered PAP application to patient's mail box at :[address], [place]  Provider portion of application received.  Patient has another month of Jardiance samples that she picked up on 03/25/19  Counseled patient on compliance.  Husband continues to assist patient with pill box.   Please see past updates related to this goal by clicking on the "Past Updates" button in the selected goal          The patient verbalized understanding of instructions provided today and declined a print copy of patient instruction materials.   The CM team will reach out to the patient again over the next 14 days.   Regina Eck, PharmD, BCPS Clinical Pharmacist, [facility] [place]  Direct Dial: [phone]

## 2019-04-15 NOTE — Progress Notes (Signed)
Chronic Care Management   Visit Note  04/13/2019 Name: Bridget Good MRN: [id number] DOB: 09/22/1951  Referred by: Bridget Brine, FNP Reason for referral : Chronic Care Management   Bridget Good is a 68 y.o. year old female who is a primary care patient of Bridget Good, Bridget Good. The CCM team was consulted for assistance with chronic disease management and care coordination needs.   Review of patient status, including review of consultants reports, relevant laboratory and other test results, and collaboration with appropriate care team members and the patient's provider was performed as part of comprehensive patient evaluation and provision of chronic care management services.    I spoke with Bridget Good by telephone today with CCM RN, Bridget Good.  Objective:   Goals Addressed            This Visit's Progress     Patient Stated   . I would like to continue to control my BGs and keep my A1c<7% (pt-stated)       Current Barriers:   Patient's current BG meter is malfunctioning-->requested new meter (completed)  Unfamiliar with new BG meter  Pharmacist Clinical Goal(s):   Over the next 14 days, patient will obtain free BG meter through insurance: FreeStyle, Precision or One Touch meters are preferred through [employer]TA (completed)  Over the next 30 days, patient will continue to check BG and maintain FBG<130.  She will work with husband to become familiar with new glucometer  Interventions:  Discussed plans with patient for ongoing care management follow up and provided patient with direct contact information for care management team  Collaboration with provider re: medication management-->request for BG meter/supplies (complete 03/19/19)  Counseled patient to continue taking Jardiance as prescribed.  Please see past updates related to this goal by clicking on the "Past Updates" button in the selected goal      . I would like to organize my medications in order to have  better adherence. (pt-stated)       Current Barriers:  . Non Adherence to prescribed medication regimen-->patient would like pill box to better organize medication.  She continues to have memory problems.  Her husband is not assisting patient with filling of weekly pill box   Pharmacist Clinical Goal(s):  Marland Kitchen Over the next 30 days, patient will demonstrate Improved medication adherence as evidenced by use of weekly pill box.   Interventions: . Comprehensive medication review performed.  . Patient to discuss pill packs with family to determine best adherence/memory strategy. Will focus on husband filling pill box at this time.  This method seems to work for patient.  Obtaining Jardiance via manufacturer would inhibit it from being packed in compliance packaging (additional confusion).  Consider compliance packaging if pill box method fails.  New weekly pill box (AM/PM slot only) delivered to patient's home.  . Counseled patient on importance of husband's involvement in filling of pill box  Patient Self Care Activities:  . Attends all scheduled provider appointments . Calls pharmacy for medication refills   Please see past updates related to this goal by clicking on the "Past Updates" button in the selected goal      . I would like to reduce my copay for Jardiance (pt-stated)       Current Barriers:   Financial stress-copay for Jardiance  Pharmacist Clinical Goal(s):  Over the next 14 days, we will apply for patient assistance with [employer] of [place]).  Based on review, patient will qualify.   Interventions:  Patient did not receive PAP packet. Delivered PAP application to patient's mail box at :[address], [place]  Provider portion of application received.  Patient has another month of Jardiance samples that she picked up on 03/25/19  Counseled patient on compliance.  Husband continues to assist patient with pill box.   Please see past updates  related to this goal by clicking on the "Past Updates" button in the selected goal           PLAN: -The CM team will reach out to the patient again over the next 14 days.   -CCM pharmacist to follow PAP for Jardiance to completion  Bridget Good, PharmD, BCPS Clinical Pharmacist, [facility] [place]: [phone]

## 2019-04-17 ENCOUNTER — Inpatient Hospital Stay (HOSPITAL_BASED_OUTPATIENT_CLINIC_OR_DEPARTMENT_OTHER): Payer: PPO | Admitting: Internal Medicine

## 2019-04-17 ENCOUNTER — Other Ambulatory Visit: Payer: Self-pay

## 2019-04-17 ENCOUNTER — Inpatient Hospital Stay: Payer: PPO | Attending: Internal Medicine

## 2019-04-17 ENCOUNTER — Telehealth: Payer: Self-pay

## 2019-04-17 VITALS — BP 144/81 | HR 63 | Temp 98.5°F | Resp 17 | Ht 63.0 in | Wt 182.2 lb

## 2019-04-17 DIAGNOSIS — G9389 Other specified disorders of brain: Secondary | ICD-10-CM

## 2019-04-17 DIAGNOSIS — M899 Disorder of bone, unspecified: Secondary | ICD-10-CM

## 2019-04-17 DIAGNOSIS — R899 Unspecified abnormal finding in specimens from other organs, systems and tissues: Secondary | ICD-10-CM

## 2019-04-17 DIAGNOSIS — I10 Essential (primary) hypertension: Secondary | ICD-10-CM | POA: Insufficient documentation

## 2019-04-17 DIAGNOSIS — Z8673 Personal history of transient ischemic attack (TIA), and cerebral infarction without residual deficits: Secondary | ICD-10-CM | POA: Diagnosis not present

## 2019-04-17 LAB — COMPREHENSIVE METABOLIC PANEL
ALT: 11 U/L (ref 0–44)
AST: 14 U/L — ABNORMAL LOW (ref 15–41)
Albumin: 3.6 g/dL (ref 3.5–5.0)
Alkaline Phosphatase: 90 U/L (ref 38–126)
Anion gap: 8 (ref 5–15)
BUN: 18 mg/dL (ref 8–23)
CO2: 26 mmol/L (ref 22–32)
Calcium: 9 mg/dL (ref 8.9–10.3)
Chloride: 106 mmol/L (ref 98–111)
Creatinine, Ser: 1.17 mg/dL — ABNORMAL HIGH (ref 0.44–1.00)
GFR calc Af Amer: 56 mL/min — ABNORMAL LOW (ref >60)
GFR calc non Af Amer: 49 mL/min — ABNORMAL LOW (ref >60)
Glucose, Bld: 161 mg/dL — ABNORMAL HIGH (ref 70–99)
Potassium: 3.7 mmol/L (ref 3.5–5.1)
Sodium: 140 mmol/L (ref 135–145)
Total Bilirubin: 0.9 mg/dL (ref 0.3–1.2)
Total Protein: 7.2 g/dL (ref 6.5–8.1)

## 2019-04-17 LAB — CBC WITH DIFFERENTIAL/PLATELET
Abs Immature Granulocytes: 0 10*3/uL (ref 0.00–0.07)
Basophils Absolute: 0 10*3/uL (ref 0.0–0.1)
Basophils Relative: 1 %
Eosinophils Absolute: 0.2 10*3/uL (ref 0.0–0.5)
Eosinophils Relative: 6 %
HCT: 44.1 % (ref 36.0–46.0)
Hemoglobin: 14.1 g/dL (ref 12.0–15.0)
Immature Granulocytes: 0 %
Lymphocytes Relative: 32 %
Lymphs Abs: 1.1 10*3/uL (ref 0.7–4.0)
MCH: 29.7 pg (ref 26.0–34.0)
MCHC: 32 g/dL (ref 30.0–36.0)
MCV: 92.8 fL (ref 80.0–100.0)
Monocytes Absolute: 0.5 10*3/uL (ref 0.1–1.0)
Monocytes Relative: 15 %
Neutro Abs: 1.7 10*3/uL (ref 1.7–7.7)
Neutrophils Relative %: 46 %
Platelets: 314 10*3/uL (ref 150–400)
RBC: 4.75 MIL/uL (ref 3.87–5.11)
RDW: 13.2 % (ref 11.5–15.5)
WBC: 3.6 10*3/uL — ABNORMAL LOW (ref 4.0–10.5)
nRBC: 0 % (ref 0.0–0.2)

## 2019-04-17 LAB — LACTATE DEHYDROGENASE: LDH: 180 U/L (ref 98–192)

## 2019-04-17 NOTE — Progress Notes (Signed)
Diagnosis Abnormal laboratory test - Plan: CBC with Differential ([place] Only), CMP ([place] only), Lactate dehydrogenase (LDH), SPEP with reflex to IFE, Kappa/lambda light chains, QIG  (Quant. immunoglobulins  - IgG, IgA, IgM)  Staging Cancer Staging No matching staging information was found for the patient.  Assessment and Plan:  1.  Abnormal imaging and ? Of myeloma.  68 yr old female referred for evaluation due to abnormal labs and bone lesion noted on MRI done in 07/2018. Pt has stroke history and is on Plavix.  She had labs done 07/14/2018 that showed K/L ratio of 1.86.  SPEP was negative.  MRI of brain done 07/12/2018 showed     Infarct and abnormal marrow signal of the clivus, new since 2016 and without CT correlate. This could be due to metastatic disease or myeloma.    Unclear if pt has continued to follow-up with neurology.  Pt reports arthritis history.  She has not had mammogram.  Last colonoscopy was in 2016 and showed diverticulitis.    Labs done 11/14/2018 reviewed and showed WBC 4.4 HB 14.7 plts 325,000.  Chemistries WNL with K+ 3.6 Cr 0.88 Ca 9.5 normal LFTs.  PT had minimal K/L light chain ratio of 1.86.  SPEP was negative.    Labs done 11/27/2018 reviewed and showed WBC 4.7 HB 14.3 plts 269,000.  Chemistries WNL with K+ 3.5 Cr 1 and normal LFTs.  SPEP negative.    Skeletal survey done 11/27/2018 reviewed and showed  IMPRESSION: Abnormal appearance of the clivus as noted on the previous MRI worrisome for a destructive process including malignancy. No significant bony lesions are observed elsewhere. There is a subcentimeter lucency in the distal right humerus which is nonspecific.  There is degenerative disc disease of the cervical and lumbar spines. Degenerative changes of both knees.  Based on lab review findings are not currently consistent with myeloma.    Labs done today 04/17/2019 reviewed and showed WBC 3.6 HB 14.1 plts 314,000.  Chemistries WNL with K+  3.7 Cr 1.17 and normal LFTs.  Pt will have follow-up in 04/2020 with repeat labs for ongoing monitoring.    2.  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  Skeletal survey that was done 11/27/2018 showed abnormal appearance of the clivus as noted on the previous MRI worrisome for a destructive process including malignancy. No significant bony lesions are observed elsewhere. I discussed the case with Dr. Mickeal Skinner of neuro-oncology. He recommended pt for repeat MRI and CT CAP.    Pt had MRI of the brain done 01/07/2019 that showed  IMPRESSION: 1. 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 Findings may represent metastasis, myeloma, or possibly invasive skull base tumor. Inflammatory process such as Paget's is also possible. 2. No acute intracranial abnormality. Stable chronic microvascular ischemic changes, volume loss, and small chronic infarcts of the Brain.    CT CAP done 12/17/2018 showed  IMPRESSION: No acute cardiopulmonary disease.  Scattered coronary artery calcifications and aortic atherosclerosis.  Scattered hypodensities in the liver are stable since 2016 and most compatible with small cysts or hamartomas. Bilateral renal cysts are stable.  Colonic diverticulosis.  No active diverticulitis.  Pt had biopsy of lesion done 02/27/2019 with pathology returning as:   Brain, biopsy, Clival Lesion - NONCASEATING GRANULOMATOUS INFLAMMATION. BONE.  Pt is planned for MRI per Dr. Mickeal Skinner in 12/2019.  Pt will follow-up with him at that time to go over results.   She should contact Dr. Mickeal Skinner if  any change in neurological symptoms.    3.  Stroke.  Pt should follow-up with Dr. Mickeal Skinner and neurology as directed.    4.  HTN.  BP is 144/81.  Pt should follow-up with PCP as directed.   5.  Health maintenance.  Last colonoscopy was in 2016. Follow-up with GI as recommended.   Screening mammogram that was done 01/30/2019 was negative.  Continue yearly screening  mammograms.    25 minutes spent with more than 50% spent in review of records, counseling and coordination of care.    Interval history:  Historical data obtained from note dated 11/27/2018.  68 yr old female referred for evaluation due to abnormal labs and bone lesion noted on MRI done in 07/2018. Pt has stroke history and is on Plavix.  She had labs done 07/14/2018 that showed K/L ratio of 1.86.  SPEP was negative.  MRI of brain done 07/12/2018 showed infarct and Abnormal marrow signal of the clivus, new since 2016 and without CT correlate. This could be due to metastatic disease or myeloma.  Unclear if pt has continued to follow-up with neurology.  Pt reports arthritis history.  She has not had mammogram.  Last colonoscopy was in 2016 and showed diverticulitis.  Pt seen initially for consultation due to reported elevated light chains and bone lesion on MRI.    Current Status:  Pt is seen today for follow-up.  She is here to go over labs.  She has been seen by Dr. Mickeal Skinner.    Problem List Patient Active Problem List   Diagnosis Date Noted  . Skull mass [M89.8X8] 02/27/2019  . Anxiety about health [F41.8] 02/09/2019  . Brain tumor ([place]) [D49.6] 02/04/2019  . Arm bruise, left, initial encounter [S40.022A] 01/07/2019  . Anxiety [F41.9] 12/30/2018  . Decreased estrogen level [E28.39] 12/23/2018  . Abnormal blood chemistry [R79.9] 12/23/2018  . Depression [F32.9] 12/23/2018  . CNS mass [G96.8] 12/15/2018  . Stroke ([place]) [I63.9] 12/15/2018  . Hypertensive retinopathy [H35.039] 12/08/2018  . Palpitations [R00.2] 07/16/2018  . Cerebral thrombosis with cerebral infarction [I63.30] 07/12/2018  . Chest pain [R07.9] 07/11/2018  . Abnormal EKG [R94.31] 07/11/2018  . AKI (acute kidney injury) ([place]) [N17.9] 07/11/2018  . Essential hypertension [I10]   . Retrognathia [M26.19] 07/10/2018  . Psychophysiological insomnia [F51.04] 07/10/2018  . Snoring [R06.83] 07/10/2018  . History of lupus ([place]) [M32.9]  01/02/2012  . CAD (coronary artery disease) [I25.10]   . Type 2 diabetes mellitus without complication, without long-term current use of insulin ([place]) [E11.9]   . Hypertensive heart disease without CHF [I11.9]   . Hyperlipidemia [E78.5]   . Obesity (BMI 30-39.9) [E66.9]   . GERD (gastroesophageal reflux disease) [K21.9]   . Osteoarthritis [M19.90]     Past Medical History Past Medical History:  Diagnosis Date  . Angina   . Anxiety   . Bronchitis   . Depression   . Diabetes mellitus   . Fibromyalgia   . GERD (gastroesophageal reflux disease)   . Headache(784.0)   . Hyperlipidemia   . Hypertension   . Hypertensive heart disease without CHF   . Lupus ([place])    "treated for it from 1992 til 2012; dr said I don't have it anymore"  . Obesity (BMI 30-39.9)   . Osteoarthritis   . Pneumonia   . Shortness of breath    lying down, upon exertion  . Shortness of breath on exertion   . Stroke [facility])    2019    Past Surgical History  Past Surgical History:  Procedure Laterality Date  . ABDOMINAL HYSTERECTOMY     partial  . CARDIAC CATHETERIZATION  ~ 2007  . CESAREAN SECTION  1978; 1981  . COLONOSCOPY    . CRANIOTOMY N/A 02/27/2019   Procedure: Endonasal Endoscopic biopsy of clival mass;  Surgeon: Judith Part, MD;  Location: [facility];  Service: Neurosurgery;  Laterality: N/A;  Endonasal Endoscopic biopsy of clival mass  . ENDOSCOPIC TRANS NASAL APPROACH WITH FUSION N/A 02/27/2019   Procedure: ENDOSCOPIC TRANS NASAL APPROACH WITH FUSION;  Surgeon: Judith Part, MD;  Location: [facility];  Service: Neurosurgery;  Laterality: N/A;  ENDOSCOPIC TRANS NASAL APPROACH WITH FUSION  . FOOT SURGERY     "had to cut it to let the fluids out; it had swollen very badly; left foot"    Family History Family History  Problem Relation Age of Onset  . Heart attack Father   . Diabetes Father   . Hypertension Mother   . Heart attack Brother   . Kidney failure Brother   . Diabetes Sister   .  Diabetes Sister      Social History  reports that she has never smoked. She has never used smokeless tobacco. She reports that she does not drink alcohol or use drugs.  Medications  Current Outpatient Medications:  .  acetaminophen (TYLENOL) 500 MG tablet, Take 500 mg by mouth as needed for mild pain., Disp: , Rfl:  .  amLODipine-benazepril (LOTREL) 10-40 MG capsule, Take 1 capsule by mouth daily., Disp: , Rfl:  .  aspirin EC 81 MG tablet, Take 81 mg by mouth daily. , Disp: , Rfl:  .  ciclopirox (PENLAC) 8 % solution, Apply topically at bedtime. Apply over nail and surrounding skin. Apply daily over previous coat. Remove weekly with polish remover. (Patient taking differently: Apply 1 application topically at bedtime. Apply over nail and surrounding skin. Apply daily over previous coat. Remove weekly with polish remover.), Disp: 6.6 mL, Rfl: 11 .  clopidogrel (PLAVIX) 75 MG tablet, Take 1 tablet (75 mg total) by mouth daily., Disp: 30 tablet, Rfl: 11 .  empagliflozin (JARDIANCE) 10 MG TABS tablet, Take 10 mg by mouth daily., Disp: 90 tablet, Rfl: 3 .  fluticasone (FLONASE) 50 MCG/ACT nasal spray, Place into both nostrils daily., Disp: , Rfl:  .  Ginsengs-Royal Jelly-Vit B12 (GINSENG ROYAL JELLY PLUS PO), Take 1 capsule by mouth daily., Disp: , Rfl:  .  HYDROcodone-acetaminophen (NORCO/VICODIN) 5-325 MG tablet, Take 1 tablet by mouth every 4 (four) hours as needed for moderate pain or severe pain., Disp: 30 tablet, Rfl: 0 .  ketoconazole (NIZORAL) 2 % cream, Apply to both feet and between toes once daily for 6 weeks, Disp: 30 g, Rfl: 1 .  metoprolol succinate (TOPROL-XL) 50 MG 24 hr tablet, Take 50 mg by mouth daily. Take with or immediately following a meal., Disp: , Rfl:  .  nitroGLYCERIN (NITROSTAT) 0.4 MG SL tablet, Place 1 tablet (0.4 mg total) under the tongue every 5 (five) minutes as needed for chest pain., Disp: 25 tablet, Rfl: 12 .  pantoprazole (PROTONIX) 40 MG tablet, Take 1 tablet  (40 mg total) by mouth daily at 12 noon., Disp: 30 tablet, Rfl: 12 .  potassium chloride (K-DUR,KLOR-CON) 10 MEQ tablet, Take 20 mEq by mouth daily., Disp: , Rfl:  .  rosuvastatin (CRESTOR) 20 MG tablet, Take 1 tablet (20 mg total) by mouth daily., Disp: 30 tablet, Rfl: 3 .  valsartan-hydrochlorothiazide (DIOVAN-HCT) 320-12.5 MG tablet, Take  1 tablet by mouth daily., Disp: , Rfl:  .  Vitamin D, Ergocalciferol, (DRISDOL) 50000 units CAPS capsule, Take 50,000 Units by mouth every other day. , Disp: , Rfl:  .  sertraline (ZOLOFT) 25 MG tablet, TAKE 1 TABLET BY MOUTH EVERYDAY AT BEDTIME (Patient not taking: Reported on 03/23/2019), Disp: 90 tablet, Rfl: 2 .  traZODone (DESYREL) 50 MG tablet, TAKE 1 TABLET (50 MG TOTAL) BY MOUTH AT BEDTIME AS NEEDED FOR SLEEP. (Patient not taking: Reported on 03/23/2019), Disp: 90 tablet, Rfl: 1  Allergies Sulfa antibiotics  Review of Systems Review of Systems - Oncology ROS negative   Physical Exam  Vitals Wt Readings from Last 3 Encounters:  04/17/19 182 lb 3.2 oz (82.6 kg)  03/23/19 179 lb 8 oz (81.4 kg)  02/27/19 176 lb 14.4 oz (80.2 kg)   Temp Readings from Last 3 Encounters:  04/17/19 98.5 F (36.9 C) (Oral)  03/23/19 98.8 F (37.1 C) (Oral)  02/28/19 100 F (37.8 C) (Oral)   BP Readings from Last 3 Encounters:  04/17/19 (!) 144/81  03/23/19 (!) 161/82  02/28/19 (!) 149/69   Pulse Readings from Last 3 Encounters:  04/17/19 63  03/23/19 (!) 48  02/28/19 61   Constitutional: Well-developed, well-nourished, and in no distress.   HENT: Head: Normocephalic and atraumatic.  Mouth/Throat: No oropharyngeal exudate. Mucosa moist. Eyes: Pupils are equal, round, and reactive to light. Conjunctivae are normal. No scleral icterus.  Neck: Normal range of motion. Neck supple. No JVD present.  Cardiovascular: Normal rate, regular rhythm and normal heart sounds.  Exam reveals no gallop and no friction rub.   No murmur heard. Pulmonary/Chest: Effort  normal and breath sounds normal. No respiratory distress. No wheezes.No rales.  Abdominal: Soft. Bowel sounds are normal. No distension. There is no tenderness. There is no guarding.  Musculoskeletal: No edema or tenderness.  Lymphadenopathy: No cervical, axillary or supraclavicular adenopathy.  Neurological: Alert and oriented to person, place, and time. No cranial nerve deficit.  Skin: Skin is warm and dry. No rash noted. No erythema. No pallor.  Psychiatric: Affect and judgment normal.   Labs Appointment on 04/17/2019  Component Date Value Ref Range Status  . WBC 04/17/2019 3.6* 4.0 - 10.5 K/uL Final  . RBC 04/17/2019 4.75  3.87 - 5.11 MIL/uL Final  . Hemoglobin 04/17/2019 14.1  12.0 - 15.0 g/dL Final  . HCT 04/17/2019 44.1  36.0 - 46.0 % Final  . MCV 04/17/2019 92.8  80.0 - 100.0 fL Final  . MCH 04/17/2019 29.7  26.0 - 34.0 pg Final  . MCHC 04/17/2019 32.0  30.0 - 36.0 g/dL Final  . RDW 04/17/2019 13.2  11.5 - 15.5 % Final  . Platelets 04/17/2019 314  150 - 400 K/uL Final  . nRBC 04/17/2019 0.0  0.0 - 0.2 % Final  . Neutrophils Relative % 04/17/2019 46  % Final  . Neutro Abs 04/17/2019 1.7  1.7 - 7.7 K/uL Final  . Lymphocytes Relative 04/17/2019 32  % Final  . Lymphs Abs 04/17/2019 1.1  0.7 - 4.0 K/uL Final  . Monocytes Relative 04/17/2019 15  % Final  . Monocytes Absolute 04/17/2019 0.5  0.1 - 1.0 K/uL Final  . Eosinophils Relative 04/17/2019 6  % Final  . Eosinophils Absolute 04/17/2019 0.2  0.0 - 0.5 K/uL Final  . Basophils Relative 04/17/2019 1  % Final  . Basophils Absolute 04/17/2019 0.0  0.0 - 0.1 K/uL Final  . Immature Granulocytes 04/17/2019 0  % Final  . Abs  Immature Granulocytes 04/17/2019 0.00  0.00 - 0.07 K/uL Final   Performed at [facility], [place] [address] [place], [place] [address]  . Sodium 04/17/2019 140  135 - 145 mmol/L Final  . Potassium 04/17/2019 3.7  3.5 - 5.1 mmol/L Final  . Chloride 04/17/2019 106  98 - 111 mmol/L Final  .  CO2 04/17/2019 26  22 - 32 mmol/L Final  . Glucose, Bld 04/17/2019 161* 70 - 99 mg/dL Final  . BUN 04/17/2019 18  8 - 23 mg/dL Final  . Creatinine, Ser 04/17/2019 1.17* 0.44 - 1.00 mg/dL Final  . Calcium 04/17/2019 9.0  8.9 - 10.3 mg/dL Final  . Total Protein 04/17/2019 7.2  6.5 - 8.1 g/dL Final  . Albumin 04/17/2019 3.6  3.5 - 5.0 g/dL Final  . AST 04/17/2019 14* 15 - 41 U/L Final  . ALT 04/17/2019 11  0 - 44 U/L Final  . Alkaline Phosphatase 04/17/2019 90  38 - 126 U/L Final  . Total Bilirubin 04/17/2019 0.9  0.3 - 1.2 mg/dL Final  . GFR calc non Af Amer 04/17/2019 49* >60 mL/min Final  . GFR calc Af Amer 04/17/2019 56* >60 mL/min Final  . Anion gap 04/17/2019 8  5 - 15 Final   Performed at [facility], [place] [address] [place], [place] [address]  . LDH 04/17/2019 180  98 - 192 U/L Final   Performed at [facility], [place] [address] [place], [place] [address]     Pathology Orders Placed This Encounter  Procedures  . CBC with Differential ([facility] Only)    Standing Status:   Future    Standing Expiration Date:   04/16/2021  . CMP ([place] only)    Standing Status:   Future    Standing Expiration Date:   04/16/2021  . Lactate dehydrogenase (LDH)    Standing Status:   Future    Standing Expiration Date:   04/16/2021  . SPEP with reflex to IFE    Standing Status:   Future    Standing Expiration Date:   04/16/2021  . Kappa/lambda light chains    Standing Status:   Future    Standing Expiration Date:   04/16/2021  . QIG  (Quant. immunoglobulins  - IgG, IgA, IgM)    Standing Status:   Future    Standing Expiration Date:   04/16/2021       Zoila Shutter MD

## 2019-04-18 LAB — IGG, IGA, IGM
IgA: 410 mg/dL — ABNORMAL HIGH (ref 87–352)
IgG (Immunoglobin G), Serum: 1228 mg/dL (ref 586–1602)
IgM (Immunoglobulin M), Srm: 20 mg/dL — ABNORMAL LOW (ref 26–217)

## 2019-04-20 LAB — PROTEIN ELECTROPHORESIS, SERUM
A/G Ratio: 1.2 (ref 0.7–1.7)
Albumin ELP: 3.5 g/dL (ref 2.9–4.4)
Alpha-1-Globulin: 0.2 g/dL (ref 0.0–0.4)
Alpha-2-Globulin: 0.6 g/dL (ref 0.4–1.0)
Beta Globulin: 1.1 g/dL (ref 0.7–1.3)
Gamma Globulin: 1 g/dL (ref 0.4–1.8)
Globulin, Total: 2.9 g/dL (ref 2.2–3.9)
Total Protein ELP: 6.4 g/dL (ref 6.0–8.5)

## 2019-04-20 LAB — KAPPA/LAMBDA LIGHT CHAINS
Kappa free light chain: 26.5 mg/L — ABNORMAL HIGH (ref 3.3–19.4)
Kappa, lambda light chain ratio: 1.66 — ABNORMAL HIGH (ref 0.26–1.65)
Lambda free light chains: 16 mg/L (ref 5.7–26.3)

## 2019-04-24 ENCOUNTER — Telehealth: Payer: Self-pay

## 2019-04-24 ENCOUNTER — Ambulatory Visit: Payer: Self-pay

## 2019-04-24 ENCOUNTER — Other Ambulatory Visit: Payer: Self-pay

## 2019-04-24 DIAGNOSIS — I119 Hypertensive heart disease without heart failure: Secondary | ICD-10-CM | POA: Diagnosis not present

## 2019-04-24 DIAGNOSIS — M1991 Primary osteoarthritis, unspecified site: Secondary | ICD-10-CM | POA: Diagnosis not present

## 2019-04-24 DIAGNOSIS — D496 Neoplasm of unspecified behavior of brain: Secondary | ICD-10-CM | POA: Diagnosis not present

## 2019-04-24 DIAGNOSIS — Z8673 Personal history of transient ischemic attack (TIA), and cerebral infarction without residual deficits: Secondary | ICD-10-CM | POA: Diagnosis not present

## 2019-04-24 DIAGNOSIS — Z7984 Long term (current) use of oral hypoglycemic drugs: Secondary | ICD-10-CM | POA: Diagnosis not present

## 2019-04-24 DIAGNOSIS — E669 Obesity, unspecified: Secondary | ICD-10-CM | POA: Diagnosis not present

## 2019-04-24 DIAGNOSIS — K219 Gastro-esophageal reflux disease without esophagitis: Secondary | ICD-10-CM | POA: Diagnosis not present

## 2019-04-24 DIAGNOSIS — F5104 Psychophysiologic insomnia: Secondary | ICD-10-CM | POA: Diagnosis not present

## 2019-04-24 DIAGNOSIS — E119 Type 2 diabetes mellitus without complications: Secondary | ICD-10-CM | POA: Diagnosis not present

## 2019-04-24 DIAGNOSIS — Z6828 Body mass index (BMI) 28.0-28.9, adult: Secondary | ICD-10-CM | POA: Diagnosis not present

## 2019-04-24 DIAGNOSIS — I10 Essential (primary) hypertension: Secondary | ICD-10-CM | POA: Diagnosis not present

## 2019-04-24 DIAGNOSIS — F329 Major depressive disorder, single episode, unspecified: Secondary | ICD-10-CM | POA: Diagnosis not present

## 2019-04-24 DIAGNOSIS — G3184 Mild cognitive impairment, so stated: Secondary | ICD-10-CM | POA: Diagnosis not present

## 2019-04-24 DIAGNOSIS — E785 Hyperlipidemia, unspecified: Secondary | ICD-10-CM | POA: Diagnosis not present

## 2019-04-24 DIAGNOSIS — I2510 Atherosclerotic heart disease of native coronary artery without angina pectoris: Secondary | ICD-10-CM | POA: Diagnosis not present

## 2019-04-24 DIAGNOSIS — F419 Anxiety disorder, unspecified: Secondary | ICD-10-CM | POA: Diagnosis not present

## 2019-04-24 DIAGNOSIS — M797 Fibromyalgia: Secondary | ICD-10-CM | POA: Diagnosis not present

## 2019-04-24 DIAGNOSIS — H35039 Hypertensive retinopathy, unspecified eye: Secondary | ICD-10-CM | POA: Diagnosis not present

## 2019-04-24 DIAGNOSIS — L930 Discoid lupus erythematosus: Secondary | ICD-10-CM | POA: Diagnosis not present

## 2019-04-27 ENCOUNTER — Other Ambulatory Visit: Payer: Self-pay

## 2019-04-27 DIAGNOSIS — E119 Type 2 diabetes mellitus without complications: Secondary | ICD-10-CM

## 2019-04-27 MED ORDER — POTASSIUM CHLORIDE CRYS ER 20 MEQ PO TBCR: 20.0000 meq | EXTENDED_RELEASE_TABLET | Freq: Every day | ORAL | 1 refills | Status: DC

## 2019-04-27 MED ORDER — AMLODIPINE BESY-BENAZEPRIL HCL 10-40 MG PO CAPS: 1.0000 | ORAL_CAPSULE | Freq: Every day | ORAL | 1 refills | Status: DC

## 2019-04-27 MED ORDER — BLOOD GLUCOSE METER KIT: PACK | 0 refills | Status: DC

## 2019-04-27 NOTE — Telephone Encounter (Signed)
Spoke with pam from [facility] with questions about medications

## 2019-04-27 NOTE — Patient Instructions (Signed)
Visit Information  Goals Addressed            This Visit's Progress     Patient Stated   . 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  . Cognitive Deficits, specifically related to short term memory loss  Nurse Case Manager Clinical Goal(s):  Marland Kitchen Over the next 30 days, patient will verbalize understanding of plan for treatment and or recommendations for brain mass.  .  Interventions:  . Completed CCM telephone follow up with patient . Confirmed with patient RN SN evaluation has been scheduled for today . Discussed ST has been ordered to assist with cognitive/memory deficits . Discussed plans with patient for ongoing care management follow up and provided patient with direct contact information for care management team . Confirmed patient has contact # for [facility] and discussed hours of nurse availability . Scheduled RNCM telephone follow up with patient for 1-2 weeks    Patient Self Care Activities:  . Self administers medications with help from family (Pharm D referral sent) . Attends all scheduled provider appointments . Calls pharmacy for medication refills . Performs ADL's independently . Performs IADL's independently . Calls provider office for new concerns or questions  Please see past updates related to this goal by clicking on the "Past Updates" button in the selected goal     . "I don't know how to use my glucometer" (pt-stated)       Current Barriers:  Marland Kitchen Knowledge Deficits related to how to correctly use freestyle glucometer  . Knowledge Deficits related to Self Health management of Diabetes  Nurse Case Manager Clinical Goal(s):  Marland Kitchen Over the next 30 days, patient will verbalize understanding of plan for Self monitoring CBGs at home.   . Over the next 60 days, patient will verbalize checking her CBG's at least once  daily and will verbalize knowing how to self manage and or call her CCM team/PCP for hypo/hyperglycemic events.   Interventions:  . Completed CCM telephone follow up with patient . Confirmed with patient RN SN evaluation has been scheduled for today . Patient requested to keep call short, she is pulling up at her home to meet nurse . Discussed SN will assist with usage of her freestyle [place] glucometer and medication management  . Scheduled RNCM telephone follow up with patient for 1-2 weeks    Patient Self Care Activities:  . Self administers medications with assistance from her family . Attends all scheduled provider appointments . Calls pharmacy for medication refills . Performs ADL's independently . Performs IADL's independently . Calls provider office for new concerns or questions  Please see past updates related to this goal by clicking on the "Past Updates" button in the selected goal        The patient verbalized understanding of instructions provided today and declined a print copy of patient instruction materials.   The CM team will reach out to the patient again over the next 1-2 weeks.   Barb Merino, RN,CCM Care Management Coordinator [place] [facility]  Direct Phone: [phone]

## 2019-04-27 NOTE — Chronic Care Management (AMB) (Signed)
Chronic Care Management   Follow Up Note   04/24/2019 Name: YANET BALLIET MRN: [id number] DOB: [birth year]  Referred by: [name], FNP Reason for referral : Chronic Care Management (CCM RN Telephone Follow Up )   ZYONA PETTAWAY is a 68 y.o. year old female who is a primary care patient of [name], [place]. The CCM team was consulted for assistance with chronic disease management and care coordination needs.    Review of patient status, including review of consultants reports, relevant laboratory and other test results, and collaboration with appropriate care team members and the patient's provider was performed as part of comprehensive patient evaluation and provision of chronic care management services.    I spoke with Mrs. Wheeler by telephone today to confirm her [facility] services have been initiated.   Goals Addressed      Patient Stated    "I am bothered by not being able to remember things very well" (pt-stated)       Current Barriers:   Knowledge Deficits related to diagnosis and treatment of brain mass  Financial Constraints.   Cognitive Deficits, specifically related to short term memory loss  Nurse Case Manager Clinical Goal(s):   Over the next 30 days, patient will verbalize understanding of plan for treatment and or recommendations for brain mass.    Interventions:   Completed CCM telephone follow up with patient  Confirmed with patient RN SN evaluation has been scheduled for today  Discussed ST has been ordered to assist with cognitive/memory deficits  Discussed plans with patient for ongoing care management follow up and provided patient with direct contact information for care management team  Confirmed patient has contact # for [facility] and discussed hours of nurse availability  Scheduled RNCM telephone follow up with patient for 1-2 weeks    Patient Self Care Activities:   Self administers medications with help from family (Pharm D referral  sent)  Attends all scheduled provider appointments  Calls pharmacy for medication refills  Performs ADL's independently  Performs IADL's independently  Calls provider office for new concerns or questions  Please see past updates related to this goal by clicking on the "Past Updates" button in the selected goal      "I don't know how to use my glucometer" (pt-stated)       Current Barriers:   Knowledge Deficits related to how to correctly use freestyle glucometer   Knowledge Deficits related to Self Health management of Diabetes  Nurse Case Manager Clinical Goal(s):   Over the next 30 days, patient will verbalize understanding of plan for Self monitoring CBGs at home.    Over the next 60 days, patient will verbalize checking her CBG's at least once daily and will verbalize knowing how to self manage and or call her CCM team/PCP for hypo/hyperglycemic events.   Interventions:   Completed CCM telephone follow up with patient  Confirmed with patient RN SN evaluation has been scheduled for today  Patient requested to keep call short, she is pulling up at her home to meet nurse  Discussed SN will assist with usage of her freestyle [place] and medication management  Scheduled RNCM telephone follow up with patient for 1-2 weeks    Patient Self Care Activities:   Self administers medications with assistance from her family  Attends all scheduled provider appointments  Calls pharmacy for medication refills  Performs ADL's independently  Performs IADL's independently  Calls provider office for new concerns or questions  Please see  past updates related to this goal by clicking on the "Past Updates" button in the selected goal           The CCM team will reach out to the patient again over the next 1-2 weeks.   Barb Merino, RN,CCM Care Management Coordinator [place] [facility]  Direct Phone: [phone]

## 2019-04-29 ENCOUNTER — Telehealth: Payer: Self-pay | Admitting: *Deleted

## 2019-04-29 ENCOUNTER — Telehealth: Payer: Self-pay

## 2019-04-29 NOTE — Telephone Encounter (Signed)
-----   Message from Zoila Shutter, MD sent at 04/29/2019 11:39 AM EDT ----- Notify pt SPEP is negative

## 2019-04-29 NOTE — Telephone Encounter (Signed)
Spoke with pt and informed her of SPEP negative as per Dr. Walden Field' instructions.  Pt voiced understanding.

## 2019-04-30 ENCOUNTER — Ambulatory Visit: Payer: Self-pay

## 2019-04-30 DIAGNOSIS — G3184 Mild cognitive impairment, so stated: Secondary | ICD-10-CM

## 2019-04-30 DIAGNOSIS — E119 Type 2 diabetes mellitus without complications: Secondary | ICD-10-CM

## 2019-04-30 DIAGNOSIS — I10 Essential (primary) hypertension: Secondary | ICD-10-CM

## 2019-04-30 NOTE — Chronic Care Management (AMB) (Signed)
  Chronic Care Management   Outreach Note  04/30/2019 Name: Bridget Good MRN: [id number] DOB: 1951-11-13  Referred by: [name], FNP Reason for referral : Chronic Care Management (RNCM Telephone Follow Up )  An unsuccessful telephone outreach was attempted today. The patient was referred to the case management team by [name] FNP for assistance with chronic care management and care coordination. I left a HIPAA compliant voice message for Bridget Good, requesting a return phone call.   Follow Up Plan: The CCM team will reach out to the patient again over the next 7-10 days.   Barb Merino, RN,CCM Care Management Coordinator [place] [facility]  Direct Phone: [phone]

## 2019-05-01 ENCOUNTER — Telehealth: Payer: Self-pay

## 2019-05-06 ENCOUNTER — Ambulatory Visit: Payer: PPO | Admitting: Nurse Practitioner

## 2019-05-06 ENCOUNTER — Ambulatory Visit: Payer: Self-pay | Admitting: Pharmacist

## 2019-05-06 DIAGNOSIS — E119 Type 2 diabetes mellitus without complications: Secondary | ICD-10-CM | POA: Diagnosis not present

## 2019-05-06 DIAGNOSIS — I2510 Atherosclerotic heart disease of native coronary artery without angina pectoris: Secondary | ICD-10-CM | POA: Diagnosis not present

## 2019-05-06 DIAGNOSIS — E669 Obesity, unspecified: Secondary | ICD-10-CM | POA: Diagnosis not present

## 2019-05-06 DIAGNOSIS — F5104 Psychophysiologic insomnia: Secondary | ICD-10-CM | POA: Diagnosis not present

## 2019-05-06 DIAGNOSIS — K219 Gastro-esophageal reflux disease without esophagitis: Secondary | ICD-10-CM | POA: Diagnosis not present

## 2019-05-06 DIAGNOSIS — F419 Anxiety disorder, unspecified: Secondary | ICD-10-CM | POA: Diagnosis not present

## 2019-05-06 DIAGNOSIS — Z8673 Personal history of transient ischemic attack (TIA), and cerebral infarction without residual deficits: Secondary | ICD-10-CM | POA: Diagnosis not present

## 2019-05-06 DIAGNOSIS — I119 Hypertensive heart disease without heart failure: Secondary | ICD-10-CM | POA: Diagnosis not present

## 2019-05-06 DIAGNOSIS — M797 Fibromyalgia: Secondary | ICD-10-CM | POA: Diagnosis not present

## 2019-05-06 DIAGNOSIS — D496 Neoplasm of unspecified behavior of brain: Secondary | ICD-10-CM | POA: Diagnosis not present

## 2019-05-06 DIAGNOSIS — L930 Discoid lupus erythematosus: Secondary | ICD-10-CM | POA: Diagnosis not present

## 2019-05-06 DIAGNOSIS — H35039 Hypertensive retinopathy, unspecified eye: Secondary | ICD-10-CM | POA: Diagnosis not present

## 2019-05-06 DIAGNOSIS — I208 Other forms of angina pectoris: Secondary | ICD-10-CM | POA: Diagnosis not present

## 2019-05-06 DIAGNOSIS — I10 Essential (primary) hypertension: Secondary | ICD-10-CM | POA: Diagnosis not present

## 2019-05-06 DIAGNOSIS — E785 Hyperlipidemia, unspecified: Secondary | ICD-10-CM | POA: Diagnosis not present

## 2019-05-06 DIAGNOSIS — Z7984 Long term (current) use of oral hypoglycemic drugs: Secondary | ICD-10-CM | POA: Diagnosis not present

## 2019-05-06 DIAGNOSIS — Z6828 Body mass index (BMI) 28.0-28.9, adult: Secondary | ICD-10-CM | POA: Diagnosis not present

## 2019-05-06 DIAGNOSIS — G3184 Mild cognitive impairment, so stated: Secondary | ICD-10-CM | POA: Diagnosis not present

## 2019-05-06 DIAGNOSIS — M1991 Primary osteoarthritis, unspecified site: Secondary | ICD-10-CM | POA: Diagnosis not present

## 2019-05-06 DIAGNOSIS — F329 Major depressive disorder, single episode, unspecified: Secondary | ICD-10-CM | POA: Diagnosis not present

## 2019-05-06 NOTE — Progress Notes (Signed)
  Chronic Care Management   Outreach Note  05/06/2019 Name: Bridget Good MRN: [id number] DOB: 1951/07/21  Referred by: [name], FNP Reason for referral : Chronic Care Management   An unsuccessful telephone outreach was attempted today. The patient was referred to the case management team by for assistance with chronic care management and care coordination.   Follow Up Plan: The CM team will reach out to the patient again over the next 5 business days.   Regina Eck, PharmD, BCPS Clinical Pharmacist, [facility] [place]: [phone]

## 2019-05-07 ENCOUNTER — Other Ambulatory Visit: Payer: Self-pay

## 2019-05-07 ENCOUNTER — Encounter: Payer: Self-pay | Admitting: Podiatry

## 2019-05-07 ENCOUNTER — Ambulatory Visit: Payer: PPO | Admitting: Podiatry

## 2019-05-07 ENCOUNTER — Telehealth: Payer: Self-pay

## 2019-05-07 DIAGNOSIS — E119 Type 2 diabetes mellitus without complications: Secondary | ICD-10-CM | POA: Diagnosis not present

## 2019-05-07 DIAGNOSIS — B351 Tinea unguium: Secondary | ICD-10-CM | POA: Diagnosis not present

## 2019-05-07 DIAGNOSIS — M79675 Pain in left toe(s): Secondary | ICD-10-CM

## 2019-05-07 DIAGNOSIS — M79674 Pain in right toe(s): Secondary | ICD-10-CM

## 2019-05-07 DIAGNOSIS — Z9229 Personal history of other drug therapy: Secondary | ICD-10-CM

## 2019-05-07 NOTE — Telephone Encounter (Signed)
Patient called to see what time her appointment is today I notified pt that her appointment was yesterday at [facility]. We have scheduled pt and I let her know that I will call her the day before to remind her. YRL,RMA

## 2019-05-07 NOTE — Patient Instructions (Signed)
Diabetes Mellitus and Foot Care  Foot care is an important part of your health, especially when you have diabetes. Diabetes may cause you to have problems because of poor blood flow (circulation) to your feet and legs, which can cause your skin to:   Become thinner and drier.   Break more easily.   Heal more slowly.   Peel and crack.  You may also have nerve damage (neuropathy) in your legs and feet, causing decreased feeling in them. This means that you may not notice minor injuries to your feet that could lead to more serious problems. Noticing and addressing any potential problems early is the best way to prevent future foot problems.  How to care for your feet  Foot hygiene   Wash your feet daily with warm water and mild soap. Do not use hot water. Then, pat your feet and the areas between your toes until they are completely dry. Do not soak your feet as this can dry your skin.   Trim your toenails straight across. Do not dig under them or around the cuticle. File the edges of your nails with an emery board or nail file.   Apply a moisturizing lotion or petroleum jelly to the skin on your feet and to dry, brittle toenails. Use lotion that does not contain alcohol and is unscented. Do not apply lotion between your toes.  Shoes and socks   Wear clean socks or stockings every day. Make sure they are not too tight. Do not wear knee-high stockings since they may decrease blood flow to your legs.   Wear shoes that fit properly and have enough cushioning. Always look in your shoes before you put them on to be sure there are no objects inside.   To break in new shoes, wear them for just a few hours a day. This prevents injuries on your feet.  Wounds, scrapes, corns, and calluses   Check your feet daily for blisters, cuts, bruises, sores, and redness. If you cannot see the bottom of your feet, use a mirror or ask someone for help.   Do not cut corns or calluses or try to remove them with medicine.   If you  find a minor scrape, cut, or break in the skin on your feet, keep it and the skin around it clean and dry. You may clean these areas with mild soap and water. Do not clean the area with peroxide, alcohol, or iodine.   If you have a wound, scrape, corn, or callus on your foot, look at it several times a day to make sure it is healing and not infected. Check for:  ? Redness, swelling, or pain.  ? Fluid or blood.  ? Warmth.  ? Pus or a bad smell.  General instructions   Do not cross your legs. This may decrease blood flow to your feet.   Do not use heating pads or hot water bottles on your feet. They may burn your skin. If you have lost feeling in your feet or legs, you may not know this is happening until it is too late.   Protect your feet from hot and cold by wearing shoes, such as at the beach or on hot pavement.   Schedule a complete foot exam at least once a year (annually) or more often if you have foot problems. If you have foot problems, report any cuts, sores, or bruises to your health care provider immediately.  Contact a health care provider if:     You have a medical condition that increases your risk of infection and you have any cuts, sores, or bruises on your feet.   You have an injury that is not healing.   You have redness on your legs or feet.   You feel burning or tingling in your legs or feet.   You have pain or cramps in your legs and feet.   Your legs or feet are numb.   Your feet always feel cold.   You have pain around a toenail.  Get help right away if:   You have a wound, scrape, corn, or callus on your foot and:  ? You have pain, swelling, or redness that gets worse.  ? You have fluid or blood coming from the wound, scrape, corn, or callus.  ? Your wound, scrape, corn, or callus feels warm to the touch.  ? You have pus or a bad smell coming from the wound, scrape, corn, or callus.  ? You have a fever.  ? You have a red line going up your leg.  Summary   Check your feet every day  for cuts, sores, red spots, swelling, and blisters.   Moisturize feet and legs daily.   Wear shoes that fit properly and have enough cushioning.   If you have foot problems, report any cuts, sores, or bruises to your health care provider immediately.   Schedule a complete foot exam at least once a year (annually) or more often if you have foot problems.  This information is not intended to replace advice given to you by your health care provider. Make sure you discuss any questions you have with your health care provider.  Document Released: 11/23/2000 Document Revised: 01/08/2018 Document Reviewed: 12/28/2016  Elsevier Interactive Patient Education  2019 [employer]vier Inc[employer]

## 2019-05-10 NOTE — Progress Notes (Signed)
Subjective:  Bridget Good presents to clinic today with cc of  painful, thick, discolored, elongated toenails 1-5 b/l that become tender and cannot cut because of thickness. Pain is aggravated when wearing enclosed shoe gear.  [name], FNP is her PCP and last vist was 04/13/2019.  She states the Penlac Nail Lacquer and Ketoconazole Cream helps her nails and feet a lot.   Current Outpatient Medications:  .  Accu-Chek FastClix Lancets MISC, USE UP TO 4 TIMES DAILY AS DIRECTED, Disp: , Rfl:  .  ACCU-CHEK GUIDE test strip, USE UP TO 4 TIMES DAILY AS DIRECTED, Disp: , Rfl:  .  acetaminophen (TYLENOL) 500 MG tablet, Take 500 mg by mouth as needed for mild pain., Disp: , Rfl:  .  amLODipine-benazepril (LOTREL) 10-40 MG capsule, Take 1 capsule by mouth daily., Disp: 90 capsule, Rfl: 1 .  aspirin EC 81 MG tablet, Take 81 mg by mouth daily. , Disp: , Rfl:  .  blood glucose meter kit and supplies, Dispense based on patient and insurance preference. Use up to four times daily as directed. (FOR ICD-10 E10.9, E11.9)., Disp: 1 each, Rfl: 0 .  ciclopirox (PENLAC) 8 % solution, Apply topically at bedtime. Apply over nail and surrounding skin. Apply daily over previous coat. Remove weekly with polish remover. (Patient taking differently: Apply 1 application topically at bedtime. Apply over nail and surrounding skin. Apply daily over previous coat. Remove weekly with polish remover.), Disp: 6.6 mL, Rfl: 11 .  clopidogrel (PLAVIX) 75 MG tablet, Take 1 tablet (75 mg total) by mouth daily., Disp: 30 tablet, Rfl: 11 .  empagliflozin (JARDIANCE) 10 MG TABS tablet, Take 10 mg by mouth daily., Disp: 90 tablet, Rfl: 3 .  fluticasone (FLONASE) 50 MCG/ACT nasal spray, Place into both nostrils daily., Disp: , Rfl:  .  Ginsengs-Royal Jelly-Vit B12 (GINSENG ROYAL JELLY PLUS PO), Take 1 capsule by mouth daily., Disp: , Rfl:  .  HYDROcodone-acetaminophen (NORCO/VICODIN) 5-325 MG tablet, Take 1 tablet by mouth every 4  (four) hours as needed for moderate pain or severe pain., Disp: 30 tablet, Rfl: 0 .  ketoconazole (NIZORAL) 2 % cream, Apply to both feet and between toes once daily for 6 weeks, Disp: 30 g, Rfl: 1 .  metoprolol succinate (TOPROL-XL) 50 MG 24 hr tablet, Take 50 mg by mouth daily. Take with or immediately following a meal., Disp: , Rfl:  .  nitroGLYCERIN (NITROSTAT) 0.4 MG SL tablet, Place 1 tablet (0.4 mg total) under the tongue every 5 (five) minutes as needed for chest pain., Disp: 25 tablet, Rfl: 12 .  pantoprazole (PROTONIX) 40 MG tablet, Take 1 tablet (40 mg total) by mouth daily at 12 noon., Disp: 30 tablet, Rfl: 12 .  potassium chloride (K-DUR) 20 MEQ tablet, Take 1 tablet (20 mEq total) by mouth daily., Disp: 90 tablet, Rfl: 1 .  rosuvastatin (CRESTOR) 20 MG tablet, Take 1 tablet (20 mg total) by mouth daily., Disp: 30 tablet, Rfl: 3 .  sertraline (ZOLOFT) 25 MG tablet, TAKE 1 TABLET BY MOUTH EVERYDAY AT BEDTIME, Disp: 90 tablet, Rfl: 2 .  traZODone (DESYREL) 50 MG tablet, TAKE 1 TABLET (50 MG TOTAL) BY MOUTH AT BEDTIME AS NEEDED FOR SLEEP., Disp: 90 tablet, Rfl: 1 .  valsartan-hydrochlorothiazide (DIOVAN-HCT) 320-12.5 MG tablet, Take 1 tablet by mouth daily., Disp: , Rfl:  .  Vitamin D, Ergocalciferol, (DRISDOL) 50000 units CAPS capsule, Take 50,000 Units by mouth every other day. , Disp: , Rfl:    Allergies  Allergen Reactions  . Sulfa Antibiotics Hives, Itching and Nausea And Vomiting     Objective: There were no vitals filed for this visit.  Physical Examination:  Vascular Examination: Capillary refill time immediate x 10 digits.  Palpable DP/PT pulses b/l.  Digital hair absent  b/l.  No edema noted b/l.  Skin temperature gradient WNL b/l.  Dermatological Examination: Skin with normal turgor, texture and tone b/l.  No open wounds b/l.  No interdigital macerations noted b/l.  Elongated, thick, discolored brittle toenails with subungual debris and pain on dorsal  palpation of nailbeds 1-5 right foot.  Nondystrophic nails 1-5 left foot.   Musculoskeletal Examination: Muscle strength 5/5 to all muscle groups b/l.  No pain, crepitus or joint discomfort with active/passive ROM.  Neurological Examination: Sensation intact 5/5 b/l with 10 gram monofilament.  Vibratory sensation intact b/l.  Proprioceptive sensation intact b/l.  Assessment: Mycotic nail infection with pain 1-5 right Nondystrophic toenails 1-5 left foot NIDDM   Plan: 1. Toenails 1-5 right foot were debrided in length and girth without iatrogenic laceration. Toenails 1-5 left foot trimmed without incident. Continue Penlac Nail Lacquer as instructed. 2.  Continue soft, supportive shoe gear daily. 3.  Report any pedal injuries to medical professional. 4.  Follow up 3 months. 5.  Patient/POA to call should there be a question/concern in there interim.

## 2019-05-11 ENCOUNTER — Ambulatory Visit: Payer: Self-pay | Admitting: Pharmacist

## 2019-05-11 ENCOUNTER — Other Ambulatory Visit: Payer: Self-pay

## 2019-05-11 DIAGNOSIS — E119 Type 2 diabetes mellitus without complications: Secondary | ICD-10-CM

## 2019-05-11 DIAGNOSIS — I10 Essential (primary) hypertension: Secondary | ICD-10-CM

## 2019-05-11 MED ORDER — FREESTYLE LITE DEVI: 1 refills | Status: DC

## 2019-05-11 NOTE — Patient Instructions (Signed)
Visit Information  Goals Addressed            This Visit's Progress     Patient Stated   . I would like to continue to control my BGs and keep my A1c<7% (pt-stated)       Current Barriers:   Patient's current BG meter is malfunctioning-->requested new meter (completed)  Pharmacist Clinical Goal(s):   Over the next 14 days, patient will obtain free BG meter through insurance: FreeStyle, Precision or One Touch meters are preferred through [employer] (completed).  Over the next 30 days, patient will obtain new glucometer to be able to check & maintain BG.    Interventions:  Discussed plans with patient for ongoing care management follow up and provided patient with direct contact information for care management team  Collaboration with provider re: medication management-->will request new FreeStyle Lite per patient request. Completed on 05/11/19  States she returned her glucometer to the pharmacy because she was uncomfortable using.  Patient is adjusting to [facility] RN filling pill box.  She states it has been challenging because she doesn't recall which pills are in each slot.  Ensured her that [facility] RN would follow her med list and she could get her husband to double check.  Also, reminded patient to bring in her medications to appt if there were additional questions.  Please see past updates related to this goal by clicking on the "Past Updates" button in the selected goal      . I would like to reduce my copay for Jardiance (pt-stated)       Current Barriers:   Financial stress-copay for Jardiance  Pharmacist Clinical Goal(s):  Over the next 30 days, we will apply for patient assistance with [employer] [place]).  Based on review, patient will qualify.   Interventions:  Patient did not receive PAP packet. Delivered PAP application to patient's mail box at :[address], [place]  Provider portion of application received.  Encouraged patient to call  PCP office for samples.  Last Jardiance samples picked up on 03/25/19.  Patient coming in to office on 05/14/19.  Reminded her to bring paperwork and benefit letter to appt in order to apply for assistance.   Please see past updates related to this goal by clicking on the "Past Updates" button in the selected goal          The patient verbalized understanding of instructions provided today and declined a print copy of patient instruction materials.   The care management team will reach out to the patient again over the next 14 days.   Regina Eck, PharmD, BCPS Clinical Pharmacist, [facility] [place]: [phone]

## 2019-05-11 NOTE — Progress Notes (Addendum)
  Chronic Care Management   Visit Note  05/12/2019 Name: Bridget Good MRN: [id number] DOB: Oct 16, 1951  Referred by: [name], FNP Reason for referral : Chronic Care Management   Bridget Good is a 68 y.o. year old female who is a primary care patient of [name], [place]. The CCM team was consulted for assistance with chronic disease management and care coordination needs.   Review of patient status, including review of consultants reports, relevant laboratory and other test results, and collaboration with appropriate care team members and the patient's provider was performed as part of comprehensive patient evaluation and provision of chronic care management services.    I spoke with [name] by telephone today.  Objective:   Goals Addressed            This Visit's Progress     Patient Stated   . I would like to continue to control my BGs and keep my A1c<7% (pt-stated)       Current Barriers:   Patient's current BG meter is malfunctioning-->requested new meter (completed)  Pharmacist Clinical Goal(s):   Over the next 14 days, patient will obtain free BG meter through insurance: FreeStyle, Precision or One Touch meters are preferred through [employer] (completed).  Over the next 30 days, patient will obtain new glucometer to be able to check & maintain BG.    Interventions:  Discussed plans with patient for ongoing care management follow up and provided patient with direct contact information for care management team  Collaboration with provider re: medication management-->will request new FreeStyle Lite per patient request. Completed on 05/11/19 per CMA.  Appreciate assistance.  States she returned her glucometer Web designer) to the pharmacy because she was uncomfortable using.  Patient is adjusting to [facility] RN filling [facility] box.  She states it has been challenging because she doesn't recall which pills are in each slot.  Ensured her that [facility] RN would follow her med  list and she could get her husband to double check.  Also, reminded patient to bring in her medications to her upcoming PCP appt if there were additional questions.  Please see past updates related to this goal by clicking on the "Past Updates" button in the selected goal      . I would like to reduce my copay for Jardiance (pt-stated)       Current Barriers:   Financial stress-copay for Jardiance  Pharmacist Clinical Goal(s):  Over the next 30 days, we will apply for patient assistance with [employer] of [place]).  Based on review, patient will qualify.   Interventions:  Patient did not receive PAP packet. Delivered PAP application to patient's mail box at :[address] [place]  Provider portion of application received.  Encouraged patient to call PCP office for samples.  Last Jardiance samples picked up on 03/25/19.  Patient coming in to office on 05/14/19.  Reminded her to bring paperwork and benefit letter to appt in order to apply for assistance.  Please see past updates related to this goal by clicking on the "Past Updates" button in the selected goal      Plan:   The care management team will reach out to the patient again over the next 14 days.   Regina Eck, PharmD, BCPS Clinical Pharmacist, [facility] [place]: [phone]

## 2019-05-12 ENCOUNTER — Ambulatory Visit (INDEPENDENT_AMBULATORY_CARE_PROVIDER_SITE_OTHER): Payer: PPO

## 2019-05-12 ENCOUNTER — Telehealth: Payer: Self-pay

## 2019-05-12 ENCOUNTER — Other Ambulatory Visit: Payer: Self-pay

## 2019-05-12 DIAGNOSIS — E119 Type 2 diabetes mellitus without complications: Secondary | ICD-10-CM

## 2019-05-12 DIAGNOSIS — I119 Hypertensive heart disease without heart failure: Secondary | ICD-10-CM | POA: Diagnosis not present

## 2019-05-12 DIAGNOSIS — K219 Gastro-esophageal reflux disease without esophagitis: Secondary | ICD-10-CM | POA: Diagnosis not present

## 2019-05-12 DIAGNOSIS — Z7984 Long term (current) use of oral hypoglycemic drugs: Secondary | ICD-10-CM | POA: Diagnosis not present

## 2019-05-12 DIAGNOSIS — D496 Neoplasm of unspecified behavior of brain: Secondary | ICD-10-CM | POA: Diagnosis not present

## 2019-05-12 DIAGNOSIS — I2510 Atherosclerotic heart disease of native coronary artery without angina pectoris: Secondary | ICD-10-CM | POA: Diagnosis not present

## 2019-05-12 DIAGNOSIS — G9689 Other specified disorders of central nervous system: Secondary | ICD-10-CM

## 2019-05-12 DIAGNOSIS — G3184 Mild cognitive impairment, so stated: Secondary | ICD-10-CM

## 2019-05-12 DIAGNOSIS — F5104 Psychophysiologic insomnia: Secondary | ICD-10-CM | POA: Diagnosis not present

## 2019-05-12 DIAGNOSIS — I10 Essential (primary) hypertension: Secondary | ICD-10-CM

## 2019-05-12 DIAGNOSIS — F419 Anxiety disorder, unspecified: Secondary | ICD-10-CM | POA: Diagnosis not present

## 2019-05-12 DIAGNOSIS — H35039 Hypertensive retinopathy, unspecified eye: Secondary | ICD-10-CM | POA: Diagnosis not present

## 2019-05-12 DIAGNOSIS — L930 Discoid lupus erythematosus: Secondary | ICD-10-CM | POA: Diagnosis not present

## 2019-05-12 DIAGNOSIS — Z6828 Body mass index (BMI) 28.0-28.9, adult: Secondary | ICD-10-CM | POA: Diagnosis not present

## 2019-05-12 DIAGNOSIS — Z8673 Personal history of transient ischemic attack (TIA), and cerebral infarction without residual deficits: Secondary | ICD-10-CM | POA: Diagnosis not present

## 2019-05-12 DIAGNOSIS — E669 Obesity, unspecified: Secondary | ICD-10-CM | POA: Diagnosis not present

## 2019-05-12 DIAGNOSIS — M797 Fibromyalgia: Secondary | ICD-10-CM | POA: Diagnosis not present

## 2019-05-12 DIAGNOSIS — E785 Hyperlipidemia, unspecified: Secondary | ICD-10-CM | POA: Diagnosis not present

## 2019-05-12 DIAGNOSIS — F329 Major depressive disorder, single episode, unspecified: Secondary | ICD-10-CM | POA: Diagnosis not present

## 2019-05-12 DIAGNOSIS — M1991 Primary osteoarthritis, unspecified site: Secondary | ICD-10-CM | POA: Diagnosis not present

## 2019-05-12 NOTE — Addendum Note (Signed)
Addended by: [name] D on: 05/12/2019 04:24 PM   Modules accepted: Orders

## 2019-05-13 ENCOUNTER — Telehealth: Payer: Self-pay

## 2019-05-13 NOTE — Chronic Care Management (AMB) (Signed)
Chronic Care Management   Follow Up Note   05/12/2019 Name: Bridget Good MRN: [id number] DOB: [birth year]  Referred by: Bridget Brine, FNP Reason for referral : Chronic Care Management (CCM RNCM Follow Up )   Bridget Good is a 68 y.o. year old female who is a primary care patient of Bridget Good, [place]. The CCM team was consulted for assistance with chronic disease management and care coordination needs.    Review of patient status, including review of consultants reports, relevant laboratory and other test results, and collaboration with appropriate care team members and the patient's provider was performed as part of comprehensive patient evaluation and provision of chronic care management services.    I spoke with Bridget Good by telephone today. I spoke with the [facility] RN Bridget Good from [facility] by telephone today.   Goals Addressed      Patient Stated   . COMPLETED: 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  . Cognitive Deficits, specifically related to short term memory loss  Nurse Case Manager Clinical Goal(s):  Marland Kitchen Over the next 30 days, patient will verbalize understanding of plan for treatment and or recommendations for brain mass   Goal Met  CCM RN CM Interventions:  Completed call with patient on 05/12/19:   Patient has completed the appropriate follow up appointments with the Neuro/Oncology team   Patient has been given the results of her brain bone biopsy and verbalizes understanding of the treatment plan  Patient has all ongoing follow-up appointments needed and has recorded them on her appointment calendar to help remember the appointments  Patient continues to use a calendar to help her remember important events and her spouse attends all medical appointments with her  Patient Self Care Activities:  .  Self administers medications with help from family (Pharm D referral sent) . Attends all scheduled provider appointments . Calls pharmacy for medication refills . Performs ADL's independently . Performs IADL's independently . Calls provider office for new concerns or questions  Please see past updates related to this goal by clicking on the "Past Updates" button in the selected goal      . "I don't know Good to use my glucometer" (pt-stated)       Current Barriers:  Marland Kitchen Knowledge Deficits related to Good to correctly use freestyle glucometer  . Knowledge Deficits related to Self Health management of Diabetes  Nurse Case Manager Clinical Goal(s):  Marland Kitchen Over the next 30 days, patient will verbalize understanding of plan for Self monitoring CBGs at home.  Goal Met  . Over the next 90 days, patient will verbalize checking her CBG's at least once daily and will verbalize knowing Good to self manage and or call her CCM team/PCP for hypo/hyperglycemic events. 05/12/19 re-established target goal date to 90 days due to COVID-19 treatment delays  CCM RN CM Interventions:  Completed call with patient on 05/12/19:    Advised embedded PharmD Bridget Good has called in a new freestyle glucometer for patient to pick up from her phamacy  Instructed patient to bring her free style glucometer in to her follow-up appt with Bridget Good, [place] set for this week on Thursday  Discussed having the [facility] RN also review Good to use the glucometer with her during home visits until she feels comfortable with Self monitoring - patient  verbalizes understanding  Discussed plans with patient for ongoing care management follow up and provided patient with direct contact information for care management team  Patient Self Care Activities:  . Self administers medications with assistance from her family . Attends all scheduled provider appointments . Calls pharmacy for medication refills . Performs ADL's independently . Performs  IADL's independently . Calls provider office for new concerns or questions  Please see past updates related to this goal by clicking on the "Past Updates" button in the selected goal     . "I have some anxiety and depression" "I have misplaced my Sertraline" (pt-stated)       Current Barriers:  Marland Kitchen Knowledge Deficits related to Self Health Management for Anxiety and Depression  . Cognitive Deficits, specifically related to recent change in memory . Financial Constraints  Nurse Case Manager Clinical Goal(s):  Marland Kitchen Over the next 30 days, patient will demonstrate improved adherence to prescribed treatment plan for symptoms of depression and anxiety as evidenced bypatient will report taking her antidepressant exactly as prescribed and without missed doses. 05/12/19-Goal Not Met - patient is not taking the prescribed medication for her anxiety's  . 05/12/19: Over the next 30 days, patient will speak with PCP provider Bridget Brine, FNP re: an alternate treatment for anxiety.    CCM RN CM Interventions:  Completed call with patient on 05/12/19:   . Received update from [facility] RN Bridget Good from [facility] that patient is not taking her Zoloft due to unwanted SE (dizziness and "feeling out of it") . In-basket message sent to provider Bridget Brine, FNP with patient update . Internal collaboration with embedded PharmD Bridget Good re: patient reported SE from [place]; [name] will send recommendations to provider Bridget Brine, FNP  Patient Self Care Activities:  . Self administers medications with assistance from her family . Attends all scheduled provider appointments . Calls pharmacy for medication refills . Performs ADL's independently . Performs IADL's independently . Calls provider office for new concerns or questions  Please see past updates related to this goal by clicking on the "Past Updates" button in the selected goal     . "I have to use my magnifying glass to see my medicines" (pt-stated)        Current Barriers:  Marland Kitchen Knowledge Deficits related to impaired vision   Nurse Case Manager Clinical Goal(s):  Marland Kitchen Over the next 60 days, patient will attend all scheduled medical appointments: including her eye exam with [place] scheduled for 06/22/19.   Interventions:  . Evaluation of current treatment plan related to impaired vision and patient's adherence to plan as established by provider - patient has prescription glasses but still needs to use a magnifying glass to help her read her medication bottles . Provided education to patient re: potential complication from uncontrolled DM, including Retinopathy . Discussed and reviewed when patient completed last eye exam with [facility] - per media manager, last exam was completed on 06/25/18 . Collaborated with [facility] regarding follow-up appointment for annual eye exam - patient has a follow-up exam scheduled for 06/22/19 _0 :49 AM . Discussed plans with patient for ongoing care management follow up and provided patient with direct contact information for care management team  Patient Self Care Activities:  . Self administers medications as prescribed . Attends all scheduled provider appointments . Calls pharmacy for medication refills . Performs ADL's independently . Performs IADL's independently . Calls provider office for new concerns or questions  Initial goal  documentation     . I would like to organize my medications in order to have better adherence. (pt-stated)       Current Barriers:  . Non Adherence to prescribed medication regimen-->patient would like pill box to better organize medication.  She continues to have memory problems.  HH RN is filling weekly pill box   Pharmacist Clinical Goal(s):  Marland Kitchen Over the next 90 days, patient will demonstrate Improved medication adherence as evidenced by use of weekly pill box. 05/12/19 re-established target goal date to 90 days due to treatment delays secondary to  COVID-19  Interventions: . Comprehensive medication review performed.  Marland Kitchen [place] RN now filling pillbox--updated medication list and faxed to [facility] RN Bridget Good fax# [phone] . Will make patient a chart of medications to familiarize   CCM RN CM Interventions:  Completed call with patient on 05/12/19:    Assessed for improved medication management since having [place] RN assist - patient reports having increased anxiety during her visit last week, reports "it was too overwhelming"  Validated patient's feelings of concern and anxiety; Discussed patient and husband's preferred method of having patient's husband fill her medication pill box and have the [facility] RN supervise  Advised patient I will reach out to the [facility] RN today to discuss her concerns and request to allow her husband to continue filling her pill box  Instructed patient to take all of her medications to her PCP follow-up appointment scheduled for this week on Thursday in order to review her medications with provider  Placed outbound call to assigned RN Bridget Good [phone] - discussed patient's reported anxiety during nurse visit last week - Bridget Good advised Mrs. Wienke was found to have "older meds" mixed in with "newer meds" - discussed several medication discrepancies were noted - discussed having an updated medication list faxed to Bridget Good at fax# [phone]   Discussed Mrs. Coolman declined having the [facility] RN add her Zoloft to her pill box - HH RN reported Mrs. Galster is not taking the Zoloft due to having bothersome SE such as dizziness and "feeling out of it"  Discussed having [place] RN have patient self demonstrate Good to use her new freestyle glucometer during each nurse visit   Encouraged [facility] RN call the CCM team anytime to discuss patient's status, provided her with RNCM contact #  Sent in basket message to provider Bridget Brine, FNP with a patient status update  Patient Self Care Activities:  . Attends all scheduled provider appointments .  Calls pharmacy for medication refills   Please see past updates related to this goal by clicking on the "Past Updates" button in the selected goal         Telephone follow up appointment with care management team member scheduled for: 05/19/19   Barb Merino, [facility] Care Management Coordinator [place] [facility]  Direct Phone: [phone]

## 2019-05-13 NOTE — Telephone Encounter (Signed)
I called pt of her appointment tomorrow 05/14/19 at 8:30am. Lonia Mad

## 2019-05-13 NOTE — Patient Instructions (Signed)
Visit Information  Goals Addressed      Patient Stated   . COMPLETED: 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  . Cognitive Deficits, specifically related to short term memory loss  Nurse Case Manager Clinical Goal(s):  Marland Kitchen Over the next 30 days, patient will verbalize understanding of plan for treatment and or recommendations for brain mass   Goal Met  CCM RN CM Interventions:  Completed call with patient on 05/12/19:   Patient has completed the appropriate follow up appointments with the Neuro/Oncology team   Patient has been given the results of her brain bone biopsy and verbalizes understanding of the treatment plan  Patient has all ongoing follow-up appointments needed and has recorded them on her appointment calendar to help remember the appointments  Patient continues to use a calendar to help her remember important events and her spouse attends all medical appointments with her  Patient Self Care Activities:  . Self administers medications with help from family (Pharm D referral sent) . Attends all scheduled provider appointments . Calls pharmacy for medication refills . Performs ADL's independently . Performs IADL's independently . Calls provider office for new concerns or questions  Please see past updates related to this goal by clicking on the "Past Updates" button in the selected goal     . "I don't know how to use my glucometer" (pt-stated)       Current Barriers:  Marland Kitchen Knowledge Deficits related to how to correctly use freestyle glucometer  . Knowledge Deficits related to Self Health management of Diabetes  Nurse Case Manager Clinical Goal(s):  Marland Kitchen Over the next 30 days, patient will verbalize understanding of plan for Self monitoring CBGs at home.  Goal Met  . Over the next 90 days, patient will verbalize checking  her CBG's at least once daily and will verbalize knowing how to self manage and or call her CCM team/PCP for hypo/hyperglycemic events. 05/12/19 re-established target goal date to 90 days due to COVID-19 treatment delays  CCM RN CM Interventions:  Completed call with patient on 05/12/19:    Advised embedded PharmD [name] has called in a new freestyle glucometer for patient to pick up from her phamacy  Instructed patient to bring her free style glucometer in to her follow-up appt with [name], [place] set for this week on Thursday  Discussed having the [facility] RN also review how to use the glucometer with her during home visits until she feels comfortable with Self monitoring - patient verbalizes understanding  Discussed plans with patient for ongoing care management follow up and provided patient with direct contact information for care management team  Patient Self Care Activities:  . Self administers medications with assistance from her family . Attends all scheduled provider appointments . Calls pharmacy for medication refills . Performs ADL's independently . Performs IADL's independently . Calls provider office for new concerns or questions  Please see past updates related to this goal by clicking on the "Past Updates" button in the selected goal     . "I have some anxiety and depression" "I have misplaced my Sertraline" (pt-stated)       Current Barriers:  Marland Kitchen Knowledge Deficits related to Self Health Management for Anxiety and Depression  . Cognitive Deficits, specifically related to recent change in memory . Film/video editor  Nurse Case  Manager Clinical Goal(s):  Marland Kitchen Over the next 30 days, patient will demonstrate improved adherence to prescribed treatment plan for symptoms of depression and anxiety as evidenced bypatient will report taking her antidepressant exactly as prescribed and without missed doses. 05/12/19-Goal Not Met - patient is not taking the prescribed  medication for her anxiety's  . 05/12/19: Over the next 30 days, patient will speak with PCP provider [name], FNP re: an alternate treatment for anxiety.    CCM RN CM Interventions:  Completed call with patient on 05/12/19:   . Received update from [facility] RN April from [facility] that patient is not taking her Zoloft due to unwanted SE (dizziness and "feeling out of it") . In-basket message sent to provider [name], FNP with patient update . Internal collaboration with embedded PharmD [name] re: patient reported SE from [place]; [name] will send recommendations to provider [name], FNP  Patient Self Care Activities:  . Self administers medications with assistance from her family . Attends all scheduled provider appointments . Calls pharmacy for medication refills . Performs ADL's independently . Performs IADL's independently . Calls provider office for new concerns or questions  Please see past updates related to this goal by clicking on the "Past Updates" button in the selected goal     . "I have to use my magnifying glass to see my medicines" (pt-stated)       Current Barriers:  Marland Kitchen Knowledge Deficits related to impaired vision   Nurse Case Manager Clinical Goal(s):  Marland Kitchen Over the next 60 days, patient will attend all scheduled medical appointments: including her eye exam with [place] scheduled for 06/22/19.   Interventions:  . Evaluation of current treatment plan related to impaired vision and patient's adherence to plan as established by provider - patient has prescription glasses but still needs to use a magnifying glass to help her read her medication bottles . Provided education to patient re: potential complication from uncontrolled DM, including Retinopathy . Discussed and reviewed when patient completed last eye exam with [facility] - per media manager, last exam was completed on 06/25/18 . Collaborated with [facility] regarding  follow-up appointment for annual eye exam - patient has a follow-up exam scheduled for 06/22/19 '@9' :26 AM . Discussed plans with patient for ongoing care management follow up and provided patient with direct contact information for care management team  Patient Self Care Activities:  . Self administers medications as prescribed . Attends all scheduled provider appointments . Calls pharmacy for medication refills . Performs ADL's independently . Performs IADL's independently . Calls provider office for new concerns or questions  Initial goal documentation     . I would like to organize my medications in order to have better adherence. (pt-stated)       Current Barriers:  . Non Adherence to prescribed medication regimen-->patient would like pill box to better organize medication.  She continues to have memory problems.  HH RN is filling weekly pill box   Pharmacist Clinical Goal(s):  Marland Kitchen Over the next 90 days, patient will demonstrate Improved medication adherence as evidenced by use of weekly pill box. 05/12/19 re-established target goal date to 90 days due to treatment delays secondary to COVID-19  Interventions: . Comprehensive medication review performed.  Marland Kitchen [place] RN now filling pillbox--updated medication list and faxed to [facility] RN April fax# [phone] . Will make patient a chart of medications to familiarize   CCM RN CM Interventions:  Completed call  with patient on 05/12/19:    Assessed for improved medication management since having [place] RN assist - patient reports having increased anxiety during her visit last week, reports "it was too overwhelming"  Validated patient's feelings of concern and anxiety; Discussed patient and husband's preferred method of having patient's husband fill her medication pill box and have the [facility] RN supervise  Advised patient I will reach out to the [facility] RN today to discuss her concerns and request to allow her husband to continue filling her pill  box  Instructed patient to take all of her medications to her PCP follow-up appointment scheduled for this week on Thursday in order to review her medications with provider  Placed outbound call to assigned RN April [phone] - discussed patient's reported anxiety during nurse visit last week - April advised Mrs. Threats was found to have "older meds" mixed in with "newer meds" - discussed several medication discrepancies were noted - discussed having an updated medication list faxed to April at fax# [phone]   Discussed Mrs. Fullman declined having the [facility] RN add her Zoloft to her pill box - HH RN reported Mrs. Hook is not taking the Zoloft due to having bothersome SE such as dizziness and "feeling out of it"  Discussed having [place] RN have patient self demonstrate how to use her new freestyle glucometer during each nurse visit   Encouraged [facility] RN call the CCM team anytime to discuss patient's status, provided her with RNCM contact #  Sent in basket message to provider [name], FNP with a patient status update  Patient Self Care Activities:  . Attends all scheduled provider appointments . Calls pharmacy for medication refills   Please see past updates related to this goal by clicking on the "Past Updates" button in the selected goal         The patient verbalized understanding of instructions provided today and declined a print copy of patient instruction materials.   Telephone follow up appointment with care management team member scheduled for: 05/19/19  Barb Merino, [facility] Care Management Coordinator [place] [facility]  Direct Phone: [phone]

## 2019-05-14 ENCOUNTER — Ambulatory Visit (INDEPENDENT_AMBULATORY_CARE_PROVIDER_SITE_OTHER): Payer: PPO | Admitting: Nurse Practitioner

## 2019-05-14 ENCOUNTER — Other Ambulatory Visit: Payer: Self-pay

## 2019-05-14 ENCOUNTER — Encounter: Payer: Self-pay | Admitting: Nurse Practitioner

## 2019-05-14 VITALS — BP 140/88 | HR 85 | Temp 98.7°F | Ht 63.2 in | Wt 180.6 lb

## 2019-05-14 DIAGNOSIS — G44229 Chronic tension-type headache, not intractable: Secondary | ICD-10-CM

## 2019-05-14 DIAGNOSIS — F419 Anxiety disorder, unspecified: Secondary | ICD-10-CM | POA: Diagnosis not present

## 2019-05-14 DIAGNOSIS — Z1159 Encounter for screening for other viral diseases: Secondary | ICD-10-CM

## 2019-05-14 DIAGNOSIS — F329 Major depressive disorder, single episode, unspecified: Secondary | ICD-10-CM

## 2019-05-14 DIAGNOSIS — E559 Vitamin D deficiency, unspecified: Secondary | ICD-10-CM | POA: Diagnosis not present

## 2019-05-14 DIAGNOSIS — E119 Type 2 diabetes mellitus without complications: Secondary | ICD-10-CM | POA: Diagnosis not present

## 2019-05-14 DIAGNOSIS — I10 Essential (primary) hypertension: Secondary | ICD-10-CM

## 2019-05-14 DIAGNOSIS — F32A Depression, unspecified: Secondary | ICD-10-CM

## 2019-05-14 MED ORDER — ESCITALOPRAM OXALATE 5 MG PO TABS: 5.0000 mg | ORAL_TABLET | Freq: Every day | ORAL | 2 refills | Status: DC

## 2019-05-14 NOTE — Progress Notes (Signed)
Subjective:     Patient ID: Bridget Good , female    DOB: February 11, 1951 , 68 y.o.   MRN: [id number]   Chief Complaint  Patient presents with  . Diabetes  . Hypertension  . Headache    patient states she has been having constant headaches    HPI  She reports intermittent chest pain and when she takes her clopidrogel.    Diabetes  She presents for her follow-up diabetic visit. She has type 2 diabetes mellitus. Her disease course has been stable. Hypoglycemia symptoms include headaches. Pertinent negatives for hypoglycemia include no confusion, dizziness or nervousness/anxiousness. There are no diabetic associated symptoms. Pertinent negatives for diabetes include no chest pain, no fatigue, no polydipsia, no polyphagia and no polyuria. There are no hypoglycemic complications. Symptoms are stable. There are no diabetic complications. Risk factors for coronary artery disease include sedentary lifestyle and stress. Current diabetic treatment includes oral agent (dual therapy). She is compliant with treatment some of the time. Her weight is increasing steadily. She is following a diabetic diet. When asked about meal planning, she reported none. She rarely participates in exercise. There is no change in her home blood glucose trend. An ACE inhibitor/angiotensin II receptor blocker is being taken. Eye exam is current.  Hypertension  This is a chronic problem. The current episode started more than 1 year ago. The problem has been gradually improving since onset. The problem is uncontrolled. Associated symptoms include headaches. Pertinent negatives include no anxiety, chest pain, malaise/fatigue, palpitations or shortness of breath. Risk factors for coronary artery disease include sedentary lifestyle, obesity, dyslipidemia and stress. Past treatments include angiotensin blockers. There are no compliance problems.  There is no history of angina or kidney disease. There is no history of chronic renal  disease.  Headache   This is a chronic problem. The current episode started more than 1 year ago. The problem occurs constantly. The problem has been unchanged. The pain does not radiate. The pain quality is not similar to prior headaches. The quality of the pain is described as aching. The patient is experiencing no pain. Pertinent negatives include no abdominal pain, coughing or dizziness. The symptoms are aggravated by fatigue. She has tried nothing for the symptoms. The treatment provided no relief. Her past medical history is significant for hypertension.     Past Medical History:  Diagnosis Date  . Angina   . Anxiety   . Bronchitis   . Depression   . Diabetes mellitus   . Fibromyalgia   . GERD (gastroesophageal reflux disease)   . Headache(784.0)   . Hyperlipidemia   . Hypertension   . Hypertensive heart disease without CHF   . Lupus ([place])    "treated for it from 1992 til 2012; dr said I don't have it anymore"  . Obesity (BMI 30-39.9)   . Osteoarthritis   . Pneumonia   . Shortness of breath    lying down, upon exertion  . Shortness of breath on exertion   . Stroke [facility])    2019     Family History  Problem Relation Age of Onset  . Heart attack Father   . Diabetes Father   . Hypertension Mother   . Heart attack Brother   . Kidney failure Brother   . Diabetes Sister   . Diabetes Sister      Current Outpatient Medications:  .  acetaminophen (TYLENOL) 500 MG tablet, Take 500 mg by mouth as needed for mild pain., Disp: , Rfl:  .  amLODipine-benazepril (LOTREL) 10-40 MG capsule, Take 1 capsule by mouth daily., Disp: 90 capsule, Rfl: 1 .  aspirin EC 81 MG tablet, Take 81 mg by mouth daily. , Disp: , Rfl:  .  blood glucose meter kit and supplies, Dispense based on patient and insurance preference. Use up to four times daily as directed. (FOR ICD-10 E10.9, E11.9)., Disp: 1 each, Rfl: 0 .  Blood Glucose Monitoring Suppl (FREESTYLE LITE) DEVI, Check blood sugars twice daily,  Disp: 1 each, Rfl: 1 .  ciclopirox (PENLAC) 8 % solution, Apply topically at bedtime. Apply over nail and surrounding skin. Apply daily over previous coat. Remove weekly with polish remover. (Patient taking differently: Apply 1 application topically at bedtime. Apply over nail and surrounding skin. Apply daily over previous coat. Remove weekly with polish remover.), Disp: 6.6 mL, Rfl: 11 .  clopidogrel (PLAVIX) 75 MG tablet, Take 1 tablet (75 mg total) by mouth daily., Disp: 30 tablet, Rfl: 11 .  empagliflozin (JARDIANCE) 10 MG TABS tablet, Take 10 mg by mouth daily., Disp: 90 tablet, Rfl: 3 .  fluticasone (FLONASE) 50 MCG/ACT nasal spray, Place into both nostrils daily., Disp: , Rfl:  .  Ginsengs-Royal Jelly-Vit B12 (GINSENG ROYAL JELLY PLUS PO), Take 1 capsule by mouth daily., Disp: , Rfl:  .  ketoconazole (NIZORAL) 2 % cream, Apply to both feet and between toes once daily for 6 weeks, Disp: 30 g, Rfl: 1 .  metoprolol succinate (TOPROL-XL) 50 MG 24 hr tablet, Take 50 mg by mouth daily. Take with or immediately following a meal., Disp: , Rfl:  .  nitroGLYCERIN (NITROSTAT) 0.4 MG SL tablet, Place 1 tablet (0.4 mg total) under the tongue every 5 (five) minutes as needed for chest pain., Disp: 25 tablet, Rfl: 12 .  pantoprazole (PROTONIX) 40 MG tablet, Take 1 tablet (40 mg total) by mouth daily at 12 noon., Disp: 30 tablet, Rfl: 12 .  potassium chloride (K-DUR) 20 MEQ tablet, Take 1 tablet (20 mEq total) by mouth daily., Disp: 90 tablet, Rfl: 1 .  rosuvastatin (CRESTOR) 20 MG tablet, Take 1 tablet (20 mg total) by mouth daily., Disp: 30 tablet, Rfl: 3 .  sertraline (ZOLOFT) 25 MG tablet, TAKE 1 TABLET BY MOUTH EVERYDAY AT BEDTIME (Patient not taking: Reported on 05/14/2019), Disp: 90 tablet, Rfl: 2 .  traZODone (DESYREL) 50 MG tablet, TAKE 1 TABLET (50 MG TOTAL) BY MOUTH AT BEDTIME AS NEEDED FOR SLEEP. (Patient not taking: Reported on 05/14/2019), Disp: 90 tablet, Rfl: 1 .  Vitamin D, Ergocalciferol,  (DRISDOL) 50000 units CAPS capsule, Take 50,000 Units by mouth every other day. , Disp: , Rfl:    Allergies  Allergen Reactions  . Sulfa Antibiotics Hives, Itching and Nausea And Vomiting     Review of Systems  Constitutional: Negative for fatigue and malaise/fatigue.  Respiratory: Negative.  Negative for cough, chest tightness and shortness of breath.   Cardiovascular: Negative for chest pain, palpitations and leg swelling.  Gastrointestinal: Negative for abdominal pain.  Endocrine: Negative for polydipsia, polyphagia and polyuria.  Skin: Negative.   Neurological: Positive for headaches. Negative for dizziness.  Psychiatric/Behavioral: Negative for agitation, behavioral problems, confusion and hallucinations. The patient is not nervous/anxious.      Today's Vitals   05/14/19 0835  BP: 140/88  Pulse: 85  Temp: 98.7 F (37.1 C)  TempSrc: Oral  Weight: 180 lb 9.6 oz (81.9 kg)  Height: 5' 3.2" (1.605 m)  PainSc: 0-No pain   Body mass index is 31.79 kg/m.  Objective:  Physical Exam Constitutional:      Appearance: She is well-developed.  Cardiovascular:     Rate and Rhythm: Normal rate and regular rhythm.     Heart sounds: Normal heart sounds. No murmur.  Pulmonary:     Effort: Pulmonary effort is normal. No respiratory distress.     Breath sounds: Normal breath sounds.  Neurological:     Mental Status: She is alert.  Psychiatric:        Mood and Affect: Mood normal. Mood is not anxious or depressed.        Speech: Speech normal.        Behavior: Behavior normal.         Assessment And Plan:     1. Depression, unspecified depression type  Will try her on escitalopram as this may not have as many side effects - escitalopram (LEXAPRO) 5 MG tablet; Take 1 tablet (5 mg total) by mouth at bedtime.  Dispense: 30 tablet; Refill: 2  2. Type 2 diabetes mellitus without complication, without long-term current use of insulin (HCC)  Chronic, controlled  Continue with  current medications, she is currently waiting on her medication Januvia with the patient assistance program  Encouraged to limit intake of sugary foods and drinks - Hemoglobin A1c - CMP14 + Anion Gap  3. Anxiety  Continues to be anxious hopefully the escitalopram will help this to improve - escitalopram (LEXAPRO) 5 MG tablet; Take 1 tablet (5 mg total) by mouth at bedtime.  Dispense: 30 tablet; Refill: 2  4. Essential hypertension . B/P is poorly controlled, she is now being followed by cardiology  . CMP ordered to check renal function.  . The importance of regular exercise and dietary modification was stressed to the patient.  - CMP14 + Anion Gap  5. Chronic tension-type headache, not intractable  This can be stress related or even lack of water.   She is advised if worsens to return call to office.  She has been referred to neurology but I am unsure if she has been  - TSH  6. Vitamin D deficiency  Will check vitamin D level and supplement as needed.     Also encouraged to spend 15 minutes in the sun daily.  - Vitamin D (25 hydroxy)  7. Encounter for hepatitis C screening test for low risk patient  Will check for Hepatitis C screening due to being born between the years [birth year] - Hepatitis C antibody       [name], FNP    THE PATIENT IS ENCOURAGED TO PRACTICE SOCIAL DISTANCING DUE TO THE COVID-19 PANDEMIC.

## 2019-05-15 LAB — CMP14 + ANION GAP
ALT: 10 IU/L (ref 0–32)
AST: 17 IU/L (ref 0–40)
Albumin/Globulin Ratio: 1.6 (ref 1.2–2.2)
Albumin: 4.5 g/dL (ref 3.8–4.8)
Alkaline Phosphatase: 88 IU/L (ref 39–117)
Anion Gap: 17 mmol/L (ref 10.0–18.0)
BUN/Creatinine Ratio: 14 (ref 12–28)
BUN: 16 mg/dL (ref 8–27)
Bilirubin Total: 1.2 mg/dL (ref 0.0–1.2)
CO2: 23 mmol/L (ref 20–29)
Calcium: 9.7 mg/dL (ref 8.7–10.3)
Chloride: 101 mmol/L (ref 96–106)
Creatinine, Ser: 1.14 mg/dL — ABNORMAL HIGH (ref 0.57–1.00)
GFR calc Af Amer: 58 mL/min/{1.73_m2} — ABNORMAL LOW (ref >59)
GFR calc non Af Amer: 50 mL/min/{1.73_m2} — ABNORMAL LOW (ref >59)
Globulin, Total: 2.8 g/dL (ref 1.5–4.5)
Glucose: 139 mg/dL — ABNORMAL HIGH (ref 65–99)
Potassium: 3.7 mmol/L (ref 3.5–5.2)
Sodium: 141 mmol/L (ref 134–144)
Total Protein: 7.3 g/dL (ref 6.0–8.5)

## 2019-05-15 LAB — HEMOGLOBIN A1C
Est. average glucose Bld gHb Est-mCnc: 169 mg/dL
Hgb A1c MFr Bld: 7.5 % — ABNORMAL HIGH (ref 4.8–5.6)

## 2019-05-15 LAB — VITAMIN D 25 HYDROXY (VIT D DEFICIENCY, FRACTURES): Vit D, 25-Hydroxy: 7.2 ng/mL — ABNORMAL LOW (ref 30.0–100.0)

## 2019-05-15 LAB — TSH: TSH: 0.674 u[IU]/mL (ref 0.450–4.500)

## 2019-05-15 LAB — HEPATITIS C ANTIBODY: Hep C Virus Ab: <0.1 s/co ratio (ref 0.0–0.9)

## 2019-05-18 ENCOUNTER — Encounter: Payer: Self-pay | Admitting: Nurse Practitioner

## 2019-05-18 ENCOUNTER — Other Ambulatory Visit: Payer: Self-pay | Admitting: Nurse Practitioner

## 2019-05-18 DIAGNOSIS — E559 Vitamin D deficiency, unspecified: Secondary | ICD-10-CM

## 2019-05-18 MED ORDER — VITAMIN D (ERGOCALCIFEROL) 1.25 MG (50000 UNIT) PO CAPS: 50000.0000 [IU] | ORAL_CAPSULE | ORAL | 1 refills | Status: DC

## 2019-05-19 ENCOUNTER — Telehealth: Payer: Self-pay

## 2019-05-20 ENCOUNTER — Ambulatory Visit: Payer: Self-pay

## 2019-05-20 DIAGNOSIS — F419 Anxiety disorder, unspecified: Secondary | ICD-10-CM

## 2019-05-20 DIAGNOSIS — F329 Major depressive disorder, single episode, unspecified: Secondary | ICD-10-CM | POA: Diagnosis not present

## 2019-05-20 DIAGNOSIS — I10 Essential (primary) hypertension: Secondary | ICD-10-CM | POA: Diagnosis not present

## 2019-05-20 DIAGNOSIS — E119 Type 2 diabetes mellitus without complications: Secondary | ICD-10-CM | POA: Diagnosis not present

## 2019-05-20 DIAGNOSIS — G3184 Mild cognitive impairment, so stated: Secondary | ICD-10-CM

## 2019-05-21 ENCOUNTER — Telehealth: Payer: Self-pay

## 2019-05-21 NOTE — Chronic Care Management (AMB) (Signed)
Chronic Care Management   Follow Up Note   05/20/2019 Name: Bridget Good MRN: [id number] DOB: 1951-10-25  Referred by: [name], FNP Reason for referral : Chronic Care Management (CCM RNCM Case Collaboration )   Bridget Good is a 68 y.o. year old female who is a primary care patient of [name], [place]. The CCM team was consulted for assistance with chronic disease management and care coordination needs.    Review of patient status, including review of consultants reports, relevant laboratory and other test results, and collaboration with appropriate care team members and the patient's provider was performed as part of comprehensive patient evaluation and provision of chronic care management services.    I received an inbound call from April RN from [facility] concerning her visit with Bridget Good today.   Goals Addressed      Patient Stated   . I would like to organize my medications in order to have better adherence. (pt-stated)   Not on track    Current Barriers:  . Non Adherence to prescribed medication regimen-->patient would like pill box to better organize medication.  She continues to have memory problems.  HH RN is filling weekly pill box   Pharmacist Clinical Goal(s):  Marland Kitchen Over the next 90 days, patient will demonstrate Improved medication adherence as evidenced by use of weekly pill box. 05/12/19 re-established target goal date to 90 days due to treatment delays secondary to COVID-19  Interventions: . Comprehensive medication review performed.  Marland Kitchen [place] RN now filling pillbox--updated medication list and faxed to [facility] RN April fax# [phone] . Will make patient a chart of medications to familiarize   CCM RN CM Interventions:  Completed call with [facility] RN from [facility] on 05/20/19:    Inbound call received from April RN from [place] [facility] [place] - discussed patient's medication management is not going well   April reported the  following concerns:  . Bridget Good misplaced her pill box this week; she was found to have a 2nd pill box in her purse that was filled incorrectly - nurse advised against using 2 pill boxes . Bridget Good who agreed to be supervising and reminding Bridget Good when to take her meds, reported to the [facility] RN that he is not really monitoring when she takes her meds, he only assisting with filling the pill box . Discussed the following medications are not in the home: . Amlodipine (newest dose prescribed in May) she has the old dose on hands . Potassium - pt needs to refill  . Vitamin D 50,000 u is missing (pt is noted to have a Vitamin D level of 7.2 from recent labs) . Protonix is missing  . Lexapro 5mg  tablet newly prescribed is missing . Freestyle glucometer has not been filled by the patient as of yesterday during visit . Discussed that CCM PharmD [name] and myself will follow up with Bridget Good this week and revisit transitioning to a pill package system . Encouraged April to call back with any/all concerns . Internal collaboration completed with [name] PharmD; scheduled a joint call to patient on 05/22/19  Patient Self Care Activities:  . Attends all scheduled provider appointments . Calls pharmacy for medication refills   Please see past updates related to this goal by clicking on the "Past Updates" button in the selected goal         Telephone follow up appointment with care management team member scheduled for: 05/22/19  Barb Merino, RN,CCM Care Management Coordinator [facility]  Direct Phone: [phone]

## 2019-05-22 ENCOUNTER — Ambulatory Visit: Payer: Self-pay | Admitting: Pharmacist

## 2019-05-22 ENCOUNTER — Other Ambulatory Visit: Payer: Self-pay

## 2019-05-22 ENCOUNTER — Telehealth: Payer: Self-pay

## 2019-05-22 ENCOUNTER — Ambulatory Visit: Payer: Self-pay

## 2019-05-22 DIAGNOSIS — E119 Type 2 diabetes mellitus without complications: Secondary | ICD-10-CM | POA: Diagnosis not present

## 2019-05-22 DIAGNOSIS — G3184 Mild cognitive impairment, so stated: Secondary | ICD-10-CM

## 2019-05-22 DIAGNOSIS — I10 Essential (primary) hypertension: Secondary | ICD-10-CM

## 2019-05-22 DIAGNOSIS — F329 Major depressive disorder, single episode, unspecified: Secondary | ICD-10-CM | POA: Diagnosis not present

## 2019-05-22 DIAGNOSIS — F419 Anxiety disorder, unspecified: Secondary | ICD-10-CM

## 2019-05-22 NOTE — Patient Instructions (Signed)
Visit Information  Goals Addressed      Patient Stated   . "I don't know how to use my glucometer" (pt-stated)       Current Barriers:  Marland Kitchen Knowledge Deficits related to how to correctly use freestyle glucometer  . Knowledge Deficits related to Self Health management of Diabetes  Nurse Case Manager Clinical Goal(s):  Marland Kitchen Over the next 30 days, patient will verbalize understanding of plan for Self monitoring CBGs at home.   Goal Met  . Over the next 90 days, patient will verbalize checking her CBG's at least once daily and will verbalize knowing how to self manage and or call her CCM team/PCP for hypo/hyperglycemic events. 05/12/19 re-established target goal date to 90 days due to COVID-19 treatment delays  CCM RN CM Interventions:  05/22/19: Call completed with patient and embedded PharmD [name]:  . Discussed that patient will pick up her free style glucometer from the pharmacy today and will consider watching the tutorial on how to use via web . Discussed patient will bring her glucometer into the office to go over in person with [name] . Discussed the [facility] RN will also work with patient on how to use the glucometer until she feels comfortable . Informed patient her A1C is elevated from 7.0 to 7.5; discussed target goal for A1C is <7.0 . Educated on importance of patient taking meds as prescribed and monitoring BG to help better manage diabetes . Discussed patient did not receive the printed ed mats mailed to her home; discussed [name] will provide written resources during office visit next week and or printed ed mats can be resent via mail  . Discussed plans with patient for ongoing care management follow up and provided patient with direct contact information for care management team -Patient verbalizes understanding and is agreeable to plan   Patient Self Care Activities:  . Self administers medications with assistance from her family . Attends all scheduled provider  appointments . Calls pharmacy for medication refills . Performs ADL's independently . Performs IADL's independently . Calls provider office for new concerns or questions  Please see past updates related to this goal by clicking on the "Past Updates" button in the selected goal      . I would like to continue to control my BGs and keep my A1c<7% (pt-stated)   Not on track    Current Barriers:   Patient's current BG meter is malfunctioning-->requested new meter (completed)   Pharmacist Clinical Goal(s):   Over the next 14 days, patient will obtain free BG meter through insurance: FreeStyle, Precision or One Touch meters are preferred through [employer] (completed).  Over the next 30 days, patient will obtain new glucometer to be able to check & maintain BG with help from husband & Home Health RN.   Interventions:  Discussed plans with patient for ongoing care management follow up and provided patient with direct contact information for care management team  Collaboration with provider re: medication management-->will request new FreeStyle Lite per patient request. Completed on 05/11/19 per patient request  States she returned her glucometer to the pharmacy because she was uncomfortable using.  Discussed with CCM RN & RN suggested [facility] RN can provide instruction  Patient is adjusting to [facility] RN filling pill box.  She states it has been challenging because she doesn't recall which pills are in each slot.  Ensured her that [facility] RN would follow her med list and she could get her husband to double check.  Also,  reminded patient to bring in her medications to appt if there were additional questions.   CCM RN CM Interventions:  05/22/19 Collaborative call completed with patient and embedded PharmD [name]:  . Discussed that patient will pick up her free style glucometer from the pharmacy today and will consider watching the tutorial on how to use via web . Discussed patient will bring her glucometer  into the office to go over in person with [name] . Discussed the [facility] RN will also work with patient on how to use the glucometer until she feels comfortable . Informed patient her A1C is elevated from 7.0 to 7.5; discussed target goal for A1C is <7.0 . Educated on importance of patient taking meds as prescribed and monitoring BG to help better manage diabetes . Discussed patient did not receive the printed ed mats mailed to her home; discussed [name] will provide written resources during office visit next week and or printed ed mats can be resent via mail  . Discussed plans with patient for ongoing care management follow up and provided patient with direct contact information for care management team -Patient verbalizes understanding and is agreeable to plan   Please see past updates related to this goal by clicking on the "Past Updates" button in the selected goal      . I would like to organize my medications in order to have better adherence. (pt-stated)   Not on track    Current Barriers:  . Non Adherence to prescribed medication regimen-->patient would like pill box to better organize medication.  She continues to have memory problems.  HH RN is filling weekly pill box   Pharmacist Clinical Goal(s):  Marland Kitchen Over the next 90 days, patient will demonstrate Improved medication adherence as evidenced by use of weekly pill box. 05/12/19 re-established target goal date to 90 days due to treatment delays secondary to COVID-19  Interventions: . Comprehensive medication review performed.  Marland Kitchen [place] RN now filling pillbox--updated medication list and faxed to [facility] RN April fax# [phone] . Will make patient a chart of medications to familiarize   CCM RN CM Interventions:  Completed call with [facility] RN from [place] [facility] on 05/20/19:    Inbound call received from April RN from [place] [facility] [place] - discussed patient's medication management is not going well   April reported the following  concerns:  . Mrs. Kautzman misplaced her pill box this week; she was found to have a 2nd pill box in her purse that was filled incorrectly - nurse advised against using 2 pill boxes . Mr. Marcello Moores who agreed to be supervising and reminding Mrs. Roedel when to take her meds, reported to the [facility] RN that he is not really monitoring when she takes her meds, he only assisting with filling the pill box . Discussed the following medications are not in the home: . Amlodipine (newest dose prescribed in May) she has the old dose on hands . Potassium - pt needs to refill  . Vitamin D 50,000 u is missing (pt is noted to have a Vitamin D level of 7.2 from recent labs) . Protonix is missing  . Lexapro 73m tablet newly prescribed is missing . Freestyle glucometer has not been filled by the patient as of yesterday during visit . Discussed that CCM PharmD JLottie Dawsonand myself will follow up with Mrs. TSutothis week and revisit transitioning to a pill package system . Encouraged April to call back with any/all concerns . Internal collaboration completed  with [name] PharmD; scheduled a joint call to patient on 05/22/19  CCM RN CM Interventions:  05/22/19: Collaborative call completed with patient and embedded PharmD [name]:  . Discussed ongoing difficulty's with med management; discussed [place] RN informed some medications are missing from home regimen; discussed [facility] RN reported patient misplaced her pill box earlier last week - pt verbalizes understanding and admitted she misplaced the pill box but later found it . Discussed idea to transition to pill package system and have meds delivered . PharmD explained process and what to expect . Discussed that patient will plan to meet with [name] next week in the office to review meds and look at an example of how the pill package system works . Discussed plans with patient for ongoing care management follow up and provided patient with direct contact information  for care management team -Patient verbalizes understanding and is agreeable to plan    Patient Self Care Activities:  . Attends all scheduled provider appointments . Calls pharmacy for medication refills   Please see past updates related to this goal by clicking on the "Past Updates" button in the selected goal         The patient verbalized understanding of instructions provided today and declined a print copy of patient instruction materials.   Face to Face appointment with care management team member scheduled for: week of 05/25/19 (to be scheduled with PharmD)  Barb Merino, RN,CCM Care Management Coordinator [place] [facility]  Direct Phone: [phone]

## 2019-05-22 NOTE — Chronic Care Management (AMB) (Signed)
Chronic Care Management   Follow Up Note   05/22/2019 Name: Bridget Good MRN: [id number] DOB: 21-Aug-1951  Referred by: [name], FNP Reason for referral : Chronic Care Management (CCM RNCM Telephone Follow Up )   Bridget Good is a 68 y.o. year old female who is a primary care patient of [name], [place]. The CCM team was consulted for assistance with chronic disease management and care coordination needs.    Review of patient status, including review of consultants reports, relevant laboratory and other test results, and collaboration with appropriate care team members and the patient's provider was performed as part of comprehensive patient evaluation and provision of chronic care management services.    A collaborative call was placed with Bridget Good and embedded PharmD [name].   Goals Addressed      Patient Stated   . "I don't know how to use my glucometer" (pt-stated)       Current Barriers:  Marland Kitchen Knowledge Deficits related to how to correctly use freestyle glucometer  . Knowledge Deficits related to Self Health management of Diabetes  Nurse Case Manager Clinical Goal(s):  Marland Kitchen Over the next 30 days, patient will verbalize understanding of plan for Self monitoring CBGs at home.   Goal Met  . Over the next 90 days, patient will verbalize checking her CBG's at least once daily and will verbalize knowing how to self manage and or call her CCM team/PCP for hypo/hyperglycemic events. 05/12/19 re-established target goal date to 90 days due to COVID-19 treatment delays  CCM RN CM Interventions:  05/22/19: Call completed with patient and embedded PharmD [name]:  . Discussed that patient will pick up her free style glucometer from the pharmacy today and will consider watching the tutorial on how to use via web . Discussed patient will bring her glucometer into the office to go over in person with [name] . Discussed the [facility] RN will also work with patient on how to  use the glucometer until she feels comfortable . Informed patient her A1C is elevated from 7.0 to 7.5; discussed target goal for A1C is <7.0 . Educated on importance of patient taking meds as prescribed and monitoring BG to help better manage diabetes . Discussed patient did not receive the printed ed mats mailed to her home; discussed [name] will provide written resources during office visit next week and or printed ed mats can be resent via mail  . Discussed plans with patient for ongoing care management follow up and provided patient with direct contact information for care management team -Patient verbalizes understanding and is agreeable to plan   Patient Self Care Activities:  . Self administers medications with assistance from her family . Attends all scheduled provider appointments . Calls pharmacy for medication refills . Performs ADL's independently . Performs IADL's independently . Calls provider office for new concerns or questions  Please see past updates related to this goal by clicking on the "Past Updates" button in the selected goal     . I would like to continue to control my BGs and keep my A1c<7% (pt-stated)   Not on track    Current Barriers:   Patient's current BG meter is malfunctioning-->requested new meter (completed)   Pharmacist Clinical Goal(s):   Over the next 14 days, patient will obtain free BG meter through insurance: FreeStyle, Precision or One Touch meters are preferred through [employer] (completed).  Over the next 30 days, patient will obtain new glucometer to be able to  check & maintain BG with help from husband & Home Health RN.   Interventions:  Discussed plans with patient for ongoing care management follow up and provided patient with direct contact information for care management team  Collaboration with provider re: medication management-->will request new FreeStyle Lite per patient request. Completed on 05/11/19 per patient request  States she  returned her glucometer to the pharmacy because she was uncomfortable using.  Discussed with CCM RN & RN suggested [facility] RN can provide instruction  Patient is adjusting to [facility] RN filling pill box.  She states it has been challenging because she doesn't recall which pills are in each slot.  Ensured her that [facility] RN would follow her med list and she could get her husband to double check.  Also, reminded patient to bring in her medications to appt if there were additional questions.  CCM RN CM Interventions:  05/22/19 Collaborative call completed with patient and embedded PharmD [name]:  . Discussed that patient will pick up her free style glucometer from the pharmacy today and will consider watching the tutorial on how to use via web . Discussed patient will bring her glucometer into the office to go over in person with [name] . Discussed the [facility] RN will also work with patient on how to use the glucometer until she feels comfortable . Informed patient her A1C is elevated from 7.0 to 7.5; discussed target goal for A1C is <7.0 . Educated on importance of patient taking meds as prescribed and monitoring BG to help better manage diabetes . Discussed patient did not receive the printed ed mats mailed to her home; discussed [name] will provide written resources during office visit next week and or printed ed mats can be resent via mail  . Discussed plans with patient for ongoing care management follow up and provided patient with direct contact information for care management team -Patient verbalizes understanding and is agreeable to plan   Please see past updates related to this goal by clicking on the "Past Updates" button in the selected goal      . I would like to organize my medications in order to have better adherence. (pt-stated)   Not on track    Current Barriers:  . Non Adherence to prescribed medication regimen-->patient would like pill box to better organize medication.  She continues to have  memory problems.  HH RN is filling weekly pill box   Pharmacist Clinical Goal(s):  Marland Kitchen Over the next 90 days, patient will demonstrate Improved medication adherence as evidenced by use of weekly pill box. 05/12/19 re-established target goal date to 90 days due to treatment delays secondary to COVID-19  Interventions: . Comprehensive medication review performed.  Marland Kitchen [place] RN now filling pillbox--updated medication list and faxed to [facility] RN April fax# [phone] . Will make patient a chart of medications to familiarize   CCM RN CM Interventions:  Completed call with [facility] RN from [place] [facility] on 05/20/19:    Inbound call received from April RN from [place] [facility] [place] - discussed patient's medication management is not going well   April reported the following concerns:  . Mrs. Vanness misplaced her pill box this week; she was found to have a 2nd pill box in her purse that was filled incorrectly - nurse advised against using 2 pill boxes . Mr. Marcello Good who agreed to be supervising and reminding Mrs. Degrace when to take her meds, reported to the [facility] RN that he is not really monitoring  when she takes her meds, he only assisting with filling the pill box . Discussed the following medications are not in the home: . Amlodipine (newest dose prescribed in May) she has the old dose on hands . Potassium - pt needs to refill  . Vitamin D 50,000 u is missing (pt is noted to have a Vitamin D level of 7.2 from recent labs) . Protonix is missing  . Lexapro 42m tablet newly prescribed is missing . Freestyle glucometer has not been filled by the patient as of yesterday during visit . Discussed that CCM PharmD JLottie Dawsonand myself will follow up with Mrs. TGordonthis week and revisit transitioning to a pill package system . Encouraged April to call back with any/all concerns . Internal collaboration completed with JLottie DawsonPharmD; scheduled a joint call to patient on 05/22/19  CCM RN CM  Interventions:  05/22/19: Collaborative call completed with patient and embedded PharmD JLottie Dawson  . Discussed ongoing difficulty's with med management; discussed HMeagherRN informed some medications are missing from home regimen; discussed [facility] reported patient misplaced her pill box earlier last week - pt verbalizes understanding and admitted she misplaced the pill box but later found it . Discussed idea to transition to pill package system and have meds delivered . PharmD explained process and what to expect . Discussed that patient will plan to meet with JAlmyra Freenext week in the office to review meds and look at an example of how the pill package system works . Discussed plans with patient for ongoing care management follow up and provided patient with direct contact information for care management team -Patient verbalizes understanding and is agreeable to plan   Patient Self Care Activities:  . Attends all scheduled provider appointments . Calls pharmacy for medication refills   Please see past updates related to this goal by clicking on the "Past Updates" button in the selected goal          Face to Face appointment with care management team member scheduled for: week of 05/25/19 (to be scheduled with PharmD)   ABarb Merino RN,CCM Care Management Coordinator [facility] [place]  Direct Phone: [phone]

## 2019-05-25 ENCOUNTER — Telehealth: Payer: Self-pay

## 2019-05-27 NOTE — Patient Instructions (Signed)
Visit Information  Goals Addressed            This Visit's Progress     Patient Stated   . "I have some anxiety and depression" "I have misplaced my Sertraline" (pt-stated)       Current Barriers:  Marland Kitchen Knowledge Deficits related to Self Health Management for Anxiety and Depression  . Cognitive Deficits, specifically related to recent change in memory . Financial Constraints  Nurse Case Manager Clinical Goal(s):  Marland Kitchen Over the next 30 days, patient will demonstrate improved adherence to prescribed treatment plan for symptoms of depression and anxiety as evidenced bypatient will report taking her antidepressant exactly as prescribed and without missed doses. 05/12/19-Goal Not Met - patient is not taking the prescribed medication for her anxiety's  . 05/12/19: Over the next 30 days, patient will speak with PCP provider [name], FNP re: an alternate treatment for anxiety.    CCM RN CM Interventions:  Completed call with patient on 05/12/19:   . Received update from [facility] RN April from [place] [facility] that patient is not taking her Zoloft due to unwanted SE (dizziness and "feeling out of it") . In-basket message sent to provider [name], FNP with patient update . Internal collaboration with embedded PharmD [name] re: patient reported SE from [place]; [name] will send recommendations to provider [name], DNP, FNP  CM PharmD Interventions:  Completed call with patient on 05/22/19:   . Recommended sertraline to Lexapro (escitalopram) due to fewer reported adverse effects.  Patient did not want to take sertraline because she was worried about side effects.  Lexapro will offer patient the ability to receive both anti-depressant effects and anti-anxiety effects.  Patient verbalizes understanding, however she has not picked up medication to begin taking.  Will continue to follow.  Patient Self Care Activities:  . Self administers medications with assistance from her family . Attends  all scheduled provider appointments . Calls pharmacy for medication refills . Performs ADL's independently . Performs IADL's independently . Calls provider office for new concerns or questions  Please see past updates related to this goal by clicking on the "Past Updates" button in the selected goal       . I would like to organize my medications in order to have better adherence. (pt-stated)       Current Barriers:  . Non Adherence to prescribed medication regimen-->patient would like pill box to better organize medication.  She continues to have memory problems.  HH RN is filling weekly pill box   Pharmacist Clinical Goal(s):  Marland Kitchen Over the next 90 days, patient will demonstrate Improved medication adherence as evidenced by use of weekly pill box. 05/12/19 re-established target goal date to 90 days due to treatment delays secondary to COVID-19  Interventions: . Comprehensive medication review performed.  Marland Kitchen [place] RN now filling pillbox--updated medication list and faxed to [facility] RN April fax# [phone] . Will make patient a chart of medications to familiarize   CCM RN CM Interventions:  Completed call with [facility] RN from [place] [facility] on 05/20/19:    Inbound call received from April RN from [place] [facility] [place] - discussed patient's medication management is not going well   April reported the following concerns:  . Mrs. Brisendine misplaced her pill box this week; she was found to have a 2nd pill box in her purse that was filled incorrectly - nurse advised against using 2 pill boxes . Mr. Magner who agreed to be supervising and  reminding Mrs. Nayak when to take her meds, reported to the [facility] RN that he is not really monitoring when she takes her meds, he only assisting with filling the pill box . Discussed the following medications are not in the home: . Amlodipine (newest dose prescribed in May) she has the old dose on hands . Potassium - pt needs to refill  . Vitamin D 50,000 u  is missing (pt is noted to have a Vitamin D level of 7.2 from recent labs) . Protonix is missing  . Lexapro 11m tablet newly prescribed is missing . Freestyle glucometer has not been filled by the patient as of yesterday during visit . Discussed that CCM PharmD JLottie Dawsonand myself will follow up with Mrs. TBaxleythis week and revisit transitioning to a pill package system . Encouraged April to call back with any/all concerns . Internal collaboration completed with JLottie DawsonPharmD; scheduled a joint call to patient on 05/22/19  CCM RN CM Interventions:  05/22/19: Collaborative call completed with patient and embedded PharmD JLottie Dawson  . Discussed ongoing difficulty's with med management; discussed HRivesRN informed some medications are missing from home regimen; discussed [facility] reported patient misplaced her pill box earlier last week - pt verbalizes understanding and admitted she misplaced the pill box but later found it . Discussed idea to transition to pill package system and have meds delivered . PharmD explained process and what to expect . Discussed that patient will plan to meet with JAlmyra Freenext week in the office to review meds and look at an example of how the pill package system works . Discussed plans with patient for ongoing care management follow up and provided patient with direct contact information for care management team -Patient verbalizes understanding and is agreeable to plan   CCM PharmD Interventions:  . Joint call with RGrand Riverson 05/22/19.  Discussed pill packaging and delivery for patient to have medications packaged, explained and delivered.  Patient agreeable to system . Encouraged patient to pick up all new medications and continue taking as prescribed. . Will have to send new Rxs to [facility] [place] free delivery and free pill packaging . Encouraged patient to take my call to set up office visit to review medications and packaging . Will  plan to meet face-to-face with patient next week    Patient Self Care Activities:  . Attends all scheduled provider appointments . Calls pharmacy for medication refills   Please see past updates related to this goal by clicking on the "Past Updates" button in the selected goal         The patient verbalized understanding of instructions provided today and declined a print copy of patient instruction materials.   The care management team will reach out to the patient again over the next 5 days.   JRegina Eck PharmD, BCPS Clinical Pharmacist, [facility] [place] [phone]

## 2019-05-27 NOTE — Progress Notes (Signed)
  Chronic Care Management   Visit Note  05/22/2019 Name: Bridget Good MRN: [id number] DOB: 12-03-1951  Referred by: [name], FNP Reason for referral : Chronic Care Management   Bridget Good is a 68 y.o. year old female who is a primary care patient of [name], [place]. The CCM team was consulted for assistance with chronic disease management and care coordination needs.   Review of patient status, including review of consultants reports, relevant laboratory and other test results, and collaboration with appropriate care team members and the patient's provider was performed as part of comprehensive patient evaluation and provision of chronic care management services.    I spoke with Bridget Good by telephone today.  Objective:   Goals Addressed            This Visit's Progress     Patient Stated   . "I have some anxiety and depression" "I have misplaced my Sertraline" (pt-stated)       Current Barriers:  Marland Kitchen Knowledge Deficits related to Self Health Management for Anxiety and Depression  . Cognitive Deficits, specifically related to recent change in memory . Financial Constraints  Nurse Case Manager Clinical Goal(s):  Marland Kitchen Over the next 30 days, patient will demonstrate improved adherence to prescribed treatment plan for symptoms of depression and anxiety as evidenced bypatient will report taking her antidepressant exactly as prescribed and without missed doses. 05/12/19-Goal Not Met - patient is not taking the prescribed medication for her anxiety's  . 05/12/19: Over the next 30 days, patient will speak with PCP provider [name], FNP re: an alternate treatment for anxiety.    CCM RN CM Interventions:  Completed call with patient on 05/12/19:   . Received update from [facility] RN April from [place] [facility] that patient is not taking her Zoloft due to unwanted SE (dizziness and "feeling out of it") . In-basket message sent to provider [name], FNP with patient update  . Internal collaboration with embedded PharmD [name] re: patient reported SE from [place]; [name] will send recommendations to provider [name], DNP, FNP  CM PharmD Interventions:  Completed call with patient on 05/22/19:   . Recommended sertraline to Lexapro (escitalopram) due to fewer reported adverse effects.  Patient did not want to take sertraline because she was worried about side effects.  Lexapro will offer patient the ability to receive both anti-depressant effects and anti-anxiety effects.  Patient verbalizes understanding, however she has not picked up medication to begin taking.  Will continue to follow.  Patient Self Care Activities:  . Self administers medications with assistance from her family . Attends all scheduled provider appointments . Calls pharmacy for medication refills . Performs ADL's independently . Performs IADL's independently . Calls provider office for new concerns or questions  Please see past updates related to this goal by clicking on the "Past Updates" button in the selected goal          Plan:   The care management team will reach out to the patient again over the next 5 days.   Regina Eck, PharmD, BCPS Clinical Pharmacist, [facility] [place]: [phone]

## 2019-06-02 ENCOUNTER — Other Ambulatory Visit: Payer: Self-pay | Admitting: Nurse Practitioner

## 2019-06-03 ENCOUNTER — Ambulatory Visit: Payer: Self-pay

## 2019-06-03 ENCOUNTER — Telehealth: Payer: Self-pay

## 2019-06-03 DIAGNOSIS — I10 Essential (primary) hypertension: Secondary | ICD-10-CM

## 2019-06-03 DIAGNOSIS — D496 Neoplasm of unspecified behavior of brain: Secondary | ICD-10-CM | POA: Diagnosis not present

## 2019-06-03 DIAGNOSIS — E119 Type 2 diabetes mellitus without complications: Secondary | ICD-10-CM | POA: Diagnosis not present

## 2019-06-03 DIAGNOSIS — G3184 Mild cognitive impairment, so stated: Secondary | ICD-10-CM

## 2019-06-03 DIAGNOSIS — Z7984 Long term (current) use of oral hypoglycemic drugs: Secondary | ICD-10-CM | POA: Diagnosis not present

## 2019-06-03 DIAGNOSIS — F329 Major depressive disorder, single episode, unspecified: Secondary | ICD-10-CM

## 2019-06-03 DIAGNOSIS — F32A Depression, unspecified: Secondary | ICD-10-CM

## 2019-06-03 DIAGNOSIS — I119 Hypertensive heart disease without heart failure: Secondary | ICD-10-CM | POA: Diagnosis not present

## 2019-06-03 DIAGNOSIS — K219 Gastro-esophageal reflux disease without esophagitis: Secondary | ICD-10-CM | POA: Diagnosis not present

## 2019-06-03 DIAGNOSIS — E669 Obesity, unspecified: Secondary | ICD-10-CM | POA: Diagnosis not present

## 2019-06-03 DIAGNOSIS — M797 Fibromyalgia: Secondary | ICD-10-CM | POA: Diagnosis not present

## 2019-06-03 DIAGNOSIS — F419 Anxiety disorder, unspecified: Secondary | ICD-10-CM | POA: Diagnosis not present

## 2019-06-03 DIAGNOSIS — L930 Discoid lupus erythematosus: Secondary | ICD-10-CM | POA: Diagnosis not present

## 2019-06-03 DIAGNOSIS — E559 Vitamin D deficiency, unspecified: Secondary | ICD-10-CM

## 2019-06-03 DIAGNOSIS — F5104 Psychophysiologic insomnia: Secondary | ICD-10-CM | POA: Diagnosis not present

## 2019-06-03 DIAGNOSIS — Z8673 Personal history of transient ischemic attack (TIA), and cerebral infarction without residual deficits: Secondary | ICD-10-CM | POA: Diagnosis not present

## 2019-06-03 DIAGNOSIS — I2510 Atherosclerotic heart disease of native coronary artery without angina pectoris: Secondary | ICD-10-CM | POA: Diagnosis not present

## 2019-06-03 DIAGNOSIS — E785 Hyperlipidemia, unspecified: Secondary | ICD-10-CM | POA: Diagnosis not present

## 2019-06-03 DIAGNOSIS — Z6828 Body mass index (BMI) 28.0-28.9, adult: Secondary | ICD-10-CM | POA: Diagnosis not present

## 2019-06-03 DIAGNOSIS — M1991 Primary osteoarthritis, unspecified site: Secondary | ICD-10-CM | POA: Diagnosis not present

## 2019-06-03 DIAGNOSIS — H35039 Hypertensive retinopathy, unspecified eye: Secondary | ICD-10-CM | POA: Diagnosis not present

## 2019-06-03 NOTE — Chronic Care Management (AMB) (Signed)
  Chronic Care Management   Follow Up Note   06/03/2019 Name: Bridget Good MRN: [id number] DOB: [birth year]  Referred by: [name], FNP Reason for referral : Chronic Care Management (CCM RNCM Case Collaboration )   Bridget Good is a 68 y.o. year old female who is a primary care patient of [name], [place]. The CCM team was consulted for assistance with chronic disease management and care coordination needs.    Review of patient status, including review of consultants reports, relevant laboratory and other test results, and collaboration with appropriate care team members and the patient's provider was performed as part of comprehensive patient evaluation and provision of chronic care management services.    I received an inbound call today from the [facility] RN April from [facility] to discuss concerns regarding Bridget Good's medication mismanagement.   Goals Addressed      Patient Stated   . I would like to organize my medications in order to have better adherence. (pt-stated)       Current Barriers:  . Non Adherence to prescribed medication regimen-->patient would like pill box to better organize medication.  She continues to have memory problems.  HH RN is filling weekly pill box   Pharmacist Clinical Goal(s):  Marland Kitchen Over the next 90 days, patient will demonstrate Improved medication adherence as evidenced by use of weekly pill box. 05/12/19 re-established target goal date to 90 days due to treatment delays secondary to COVID-19  Interventions: . Comprehensive medication review performed.  Marland Kitchen [place] RN now filling pillbox--updated medication list and faxed to [facility] RN April fax# [phone] . Will make patient a chart of medications to familiarize   CCM RN CM Interventions:  06/03/19: Completed call with [facility] RN April from [place] [facility] [place] call received from April RN from [place] [facility] [place] - discussed ongoing concerns related to poor medication management  -patient continues to have older meds in the home and mixed in with her newer meds - nurse instructed spouse on proper disposal of the old medications . Discussed the following medications continue not to be in the home: . Amlodipine (newest dose prescribed in May) she has the old dose on hands . Potassium - pt needs to refill  . Protonix is missing  . Lexapro 5mg  tablet newly prescribed is missing . Freestyle glucometer  . Discussed joint collaboration was completed with Bridget Good to discuss transitioning to the pill pack and she was agreeable - informed Bridget Good has not returned or answered Bridget Good's calls following that encounter nor did she show up at office for a planned face to face visit with Bridget Good . Discussed the CCM team will attempt outreach to Bridget Good again tomorrow am  . Discussed that I will notify Bridget Good of her ongoing concerns and request that she initiate the pill pack system asap  Patient Self Care Activities:  . Attends all scheduled provider appointments . Calls pharmacy for medication refills   Please see past updates related to this goal by clicking on the "Past Updates" button in the selected goal         Telephone follow up appointment with care management team member scheduled for: 06/04/19   Barb Merino, RN, BSN, CCM Care Management Coordinator [place] [facility]  Direct Phone: [phone]

## 2019-06-04 ENCOUNTER — Other Ambulatory Visit: Payer: Self-pay

## 2019-06-04 ENCOUNTER — Telehealth: Payer: Self-pay

## 2019-06-04 ENCOUNTER — Ambulatory Visit: Payer: Self-pay

## 2019-06-04 ENCOUNTER — Telehealth: Payer: Self-pay | Admitting: Pharmacist

## 2019-06-04 ENCOUNTER — Ambulatory Visit: Payer: Self-pay | Admitting: Pharmacist

## 2019-06-04 DIAGNOSIS — F419 Anxiety disorder, unspecified: Secondary | ICD-10-CM

## 2019-06-04 DIAGNOSIS — I10 Essential (primary) hypertension: Secondary | ICD-10-CM

## 2019-06-04 DIAGNOSIS — F329 Major depressive disorder, single episode, unspecified: Secondary | ICD-10-CM

## 2019-06-04 DIAGNOSIS — E119 Type 2 diabetes mellitus without complications: Secondary | ICD-10-CM | POA: Diagnosis not present

## 2019-06-04 DIAGNOSIS — E559 Vitamin D deficiency, unspecified: Secondary | ICD-10-CM

## 2019-06-04 DIAGNOSIS — G3184 Mild cognitive impairment, so stated: Secondary | ICD-10-CM

## 2019-06-04 DIAGNOSIS — F32A Depression, unspecified: Secondary | ICD-10-CM

## 2019-06-04 MED ORDER — POTASSIUM CHLORIDE CRYS ER 20 MEQ PO TBCR: 20.0000 meq | EXTENDED_RELEASE_TABLET | Freq: Every day | ORAL | 1 refills | Status: DC

## 2019-06-04 MED ORDER — PANTOPRAZOLE SODIUM 40 MG PO TBEC: 40.0000 mg | DELAYED_RELEASE_TABLET | Freq: Every day | ORAL | 1 refills | Status: DC

## 2019-06-04 MED ORDER — FREESTYLE LITE DEVI: 1 refills | Status: DC

## 2019-06-04 MED ORDER — ESCITALOPRAM OXALATE 5 MG PO TABS: 5.0000 mg | ORAL_TABLET | Freq: Every day | ORAL | 1 refills | Status: DC

## 2019-06-04 MED ORDER — ASPIRIN EC 81 MG PO TBEC: 81.0000 mg | DELAYED_RELEASE_TABLET | Freq: Every day | ORAL | 1 refills | Status: DC

## 2019-06-04 MED ORDER — VITAMIN D (ERGOCALCIFEROL) 1.25 MG (50000 UNIT) PO CAPS: 50000.0000 [IU] | ORAL_CAPSULE | ORAL | 1 refills | Status: DC

## 2019-06-04 MED ORDER — GINSENG ROYAL JELLY PLUS 375-1-15 MG-MG-MCG PO CAPS: 1.0000 | ORAL_CAPSULE | Freq: Every day | ORAL | 1 refills | Status: DC

## 2019-06-04 MED ORDER — METOPROLOL SUCCINATE ER 50 MG PO TB24: 50.0000 mg | ORAL_TABLET | Freq: Every day | ORAL | 1 refills | Status: DC

## 2019-06-04 MED ORDER — BLOOD GLUCOSE METER KIT: PACK | 0 refills | Status: DC

## 2019-06-04 MED ORDER — NITROGLYCERIN 0.4 MG SL SUBL: 0.4000 mg | SUBLINGUAL_TABLET | SUBLINGUAL | 12 refills | Status: DC | PRN

## 2019-06-04 MED ORDER — ACETAMINOPHEN 500 MG PO TABS: 500.0000 mg | ORAL_TABLET | ORAL | 1 refills | Status: AC | PRN

## 2019-06-04 MED ORDER — CLOPIDOGREL BISULFATE 75 MG PO TABS: 75.0000 mg | ORAL_TABLET | Freq: Every day | ORAL | 1 refills | Status: DC

## 2019-06-04 MED ORDER — ROSUVASTATIN CALCIUM 20 MG PO TABS: 20.0000 mg | ORAL_TABLET | Freq: Every day | ORAL | 1 refills | Status: DC

## 2019-06-04 MED ORDER — AMLODIPINE BESY-BENAZEPRIL HCL 10-40 MG PO CAPS: 1.0000 | ORAL_CAPSULE | Freq: Every day | ORAL | 1 refills | Status: DC

## 2019-06-04 NOTE — Patient Instructions (Addendum)
Visit Information  Goals Addressed            This Visit's Progress     Patient Stated   . I would like to organize my medications in order to have better adherence. (pt-stated)       Current Barriers:  . Non Adherence to prescribed medication regimen-->patient would like pill box to better organize medication.  She continues to have memory problems.  HH RN is filling weekly pill box   Pharmacist Clinical Goal(s):  Marland Kitchen Over the next 90 days, patient will demonstrate Improved medication adherence as evidenced by use of weekly complianc packaging from [place]. 05/12/19 re-established target goal date to 90 days due to treatment delays secondary to COVID-19  Interventions: 06/04/19: Completed call with patient and embedded RN CM Angel Little  . Comprehensive medication review performed.  Marland Kitchen [place] RN now filling pillbox--updated medication list and faxed to [facility] RN April fax# [phone] last month . Requested new Rxs be sent to [place] to initiate pill packaging.  Task completed on 06/04/19. . Call placed to [place] to review medications with PharmD Christy Sartorius).  He will be able to package 8 medications total: amlodipine/benaz, lexapro, vit D, ASA, clopidogrel, metoprolol, crestor, protonix).  Of note, Jardiance will NOT be packaged as patient receives samples from PCP office.  Patient assistance was attempted, however patient has been unable to complete application despite multiple attempts.  Total cost to patient per month with delivery is $45.99. Will check with patient and family to see if this is feasible . Patient and husband verbalize understanding & are in agreement with the care plan.  They state they will pick up pill packs before 5:30pm today. . Call placed to [place] to assist with smooth transition.  PharmD to provide education on pill packaging system and answer questions.  He will also be able to assist patient with setting up  glucometer (Freestyle Lite) . Information discussed with CCM RN CM, Barb Merino  CCM RN CM Interventions:  06/04/19: Completed call with patient and embedded PharmD [name]   . Provided Mrs. Levitz with her new pharmacy information and advised her pill packaging system is ready to be picked up . [name] provided details related the cost of a 30 day supply of $45.99 that will be required to be paid upon pick up and or delivery . Instructed patient once she starts the new pill packaging system, she will no longer pull meds from her pill box . Instructed patient her Vania Rea will not be in the new pill pack due to she is getting samples from the office at this time . Discussed having the Rx for the freestyle glucometer faxed to the new pharmacy . Discussed patient will pick up the glucometer and will begin self monitoring her CBG's at home asap . Discussed plans with patient for ongoing care management follow up and provided patient with direct contact information for care management team  Patient Self Care Activities:  . Attends all scheduled provider appointments . Calls pharmacy for medication refills   Please see past updates related to this goal by clicking on the "Past Updates" button in the selected goal         The patient verbalized understanding of instructions provided today and declined a print copy of patient instruction materials.   The care management team will reach out to the patient again tomorrow.  Regina Eck, PharmD, BCPS Clinical Pharmacist, [facility]  [place]: [phone]

## 2019-06-04 NOTE — Chronic Care Management (AMB) (Signed)
Chronic Care Management   Visit Note  06/04/2019 Name: Bridget Good MRN: [id number] DOB: 1950/12/28  Referred by: Bridget Brine, FNP Reason for referral : Chronic Care Management   Bridget Good is a 68 y.o. year old female who is a primary care patient of Bridget Good, [place]. The CCM team was consulted for assistance with chronic disease management and care coordination needs.   Review of patient status, including review of consultants reports, relevant laboratory and other test results, and collaboration with appropriate care team members and the patient's provider was performed as part of comprehensive patient evaluation and provision of chronic care management services.    I spoke with Bridget Good by telephone today.  Objective:   Goals Addressed            This Visit's Progress     Patient Stated   . I would like to organize my medications in order to have better adherence. (pt-stated)       Current Barriers:  . Non Adherence to prescribed medication regimen-->patient would like pill box to better organize medication.  She continues to have memory problems.  HH RN is filling weekly pill box   Pharmacist Clinical Goal(s):  Bridget Good Over the next 90 days, patient will demonstrate Improved medication adherence as evidenced by use of weekly complianc packaging from [place]. 05/12/19 re-established target goal date to 90 days due to treatment delays secondary to COVID-19  Interventions: 06/04/19: Completed call with patient and embedded RN CM Angel Little  . Comprehensive medication review performed.  Bridget Good [place] RN now filling pillbox--updated medication list and faxed to [facility] RN April fax# [phone] last month . Requested new Rxs be sent to [place] to initiate pill packaging.  Task completed on 06/04/19. . Call placed to [place] to review medications with PharmD Christy Sartorius).  He will be able to package 8 medications total:  amlodipine/benaz, lexapro, vit D, ASA, clopidogrel, metoprolol, crestor, protonix).  Of note, Jardiance will NOT be packaged as patient receives samples from PCP office.  Patient assistance was attempted, however patient has been unable to complete application despite multiple attempts.  Total cost to patient per month with delivery is $45.99. Will check with patient and family to see if this is feasible . Patient and husband verbalize understanding & are in agreement with the care plan.  They state they will pick up pill packs before 5:30pm today. . Call placed to [place] to assist with smooth transition.  PharmD to provide education on pill packaging system and answer questions.  He will also be able to assist patient with setting up glucometer (Freestyle Lite) . Information discussed with CCM RN CM, Barb Merino  CCM RN CM Interventions:  06/04/19: Completed call with patient and embedded PharmD [name]   . Provided Bridget Good with her new pharmacy information and advised her pill packaging system is ready to be picked up . [name] provided details related the cost of a 30 day supply of $45.99 that will be required to be paid upon pick up and or delivery . Instructed patient once she starts the new pill packaging system, she will no longer pull meds from her pill box . Instructed patient her Bridget Good will not be in the new pill pack due to she is getting samples from the office at this time . Discussed having the Rx for the freestyle glucometer faxed to the new pharmacy . Discussed patient will pick up the glucometer and  will begin self monitoring her CBG's at home asap . Discussed plans with patient for ongoing care management follow up and provided patient with direct contact information for care management team  Patient Self Care Activities:  . Attends all scheduled provider appointments . Calls pharmacy for medication refills   Please see past updates related to this goal by  clicking on the "Past Updates" button in the selected goal          Plan:   The care management team will reach out to the patient again tomorrow.   Regina Eck, PharmD, BCPS Clinical Pharmacist, [facility] [place]: [phone]

## 2019-06-04 NOTE — Patient Instructions (Signed)
Visit Information  Goals Addressed      Patient Stated   . I would like to organize my medications in order to have better adherence. (pt-stated)       Current Barriers:  . Non Adherence to prescribed medication regimen-->patient would like pill box to better organize medication.  She continues to have memory problems.  HH RN is filling weekly pill box   Pharmacist Clinical Goal(s):  Marland Kitchen Over the next 90 days, patient will demonstrate Improved medication adherence as evidenced by use of weekly complianc packaging from [place]. 05/12/19 re-established target goal date to 90 days due to treatment delays secondary to COVID-19  Interventions: 06/04/19: Completed call with patient and embedded RN CM Chanc Kervin  . Comprehensive medication review performed.  Marland Kitchen [place] RN now filling pillbox--updated medication list and faxed to [facility] RN April fax# [phone] last month . Requested new Rxs be sent to [place] to initiate pill packaging.  Task completed on 06/04/19. . Call placed to [place] to review medications with PharmD Christy Sartorius).  He will be able to package 8 medications total: amlodipine/benaz, lexapro, vit D, ASA, clopidogrel, metoprolol, crestor, protonix).  Of note, Jardiance will NOT be packaged as patient receives samples from PCP office.  Patient assistance was attempted, however patient has been unable to complete application despite multiple attempts.  Total cost to patient per month with delivery is $45.99. Will check with patient and family to see if this is feasible . Patient and husband verbalize understanding & are in agreement with the care plan.  They state they will pick up pill packs before 5:30pm today. . Call placed to [place] to assist with smooth transition.  PharmD to provide education on pill packaging system and answer questions.  He will also be able to assist patient with setting up glucometer (Freestyle Lite) . Information  discussed with CCM RN CM, Barb Merino  CCM RN CM Interventions:  06/04/19: Completed call with patient and embedded PharmD [name]   . Provided Mrs. Keepers with her new pharmacy information and advised her pill packaging system is ready to be picked up . [name] provided details related the cost of a 30 day supply of $45.99 that will be required to be paid upon pick up and or delivery . Instructed patient once she starts the new pill packaging system, she will no longer pull meds from her pill box . Instructed patient her Vania Rea will not be in the new pill pack due to she is getting samples from the office at this time . Discussed having the Rx for the freestyle glucometer faxed to the new pharmacy . Discussed patient will pick up the glucometer and will begin self monitoring her CBG's at home asap . Attempted call to [facility] RN April [phone] to provide patient update, no answer, unable to leave a voice message . Sent in basket message to provider [name], FNP requesting a prn nurse visit for tomorrow for reinforcement of use of new pill pack system and glucometer (to ensure patient doesn't get confused and pull meds from her pill box in addition to her new pill pack) . Discussed plans with patient for ongoing care management follow up and provided patient with direct contact information for care management team  06/04/19 Inbound call from April RN from [place] at [facility]  . Discussed patient's transition to a pill pack system and new pharmacy . Discussed patient was instructed on use and was instructed to pick  up the pill pack today along with her free style glucometer . Discussed the pharmacist will instruct patient and her spouse on how to use the pill pack and glucometer . Discussed provider [name], FNP has approved a prn visit for tomorrow for [facility] to oversee patient is properly using her new pill pack and disposing of her pill box . Encouraged nurse April to contact the CCM  with further questions or concerns  Patient Self Care Activities:  . Attends all scheduled provider appointments . Calls pharmacy for medication refills   Please see past updates related to this goal by clicking on the "Past Updates" button in the selected goal         The patient verbalized understanding of instructions provided today and declined a print copy of patient instruction materials.   Telephone follow up appointment with care management team member scheduled for: 06/11/19  Barb Merino, RN, BSN, CCM Care Management Coordinator [place] [facility]  Direct Phone: [phone]

## 2019-06-04 NOTE — Chronic Care Management (AMB) (Signed)
Chronic Care Management   Follow Up Note   06/04/2019 Name: Bridget Good MRN: [id number] DOB: [birth year]  Referred by: Bridget Brine, Bridget Good Reason for referral : Chronic Care Management (CCM RNCM Telephone Follow up )   Bridget Good is a 68 y.o. year old female who is a primary care patient of Bridget Good, [place]. The CCM team was consulted for assistance with chronic disease management and care coordination needs.    Review of patient status, including review of consultants reports, relevant laboratory and other test results, and collaboration with appropriate care team members and the patient's provider was performed as part of comprehensive patient evaluation and provision of chronic care management services.    I spoke with Bridget Good by telephone today in a collaborative call with embedded PharmD Bridget Good.   Goals Addressed      Patient Stated   . I would like to organize my medications in order to have better adherence. (pt-stated)       Current Barriers:  . Non Adherence to prescribed medication regimen-->patient would like pill box to better organize medication.  She continues to have memory problems.  HH RN is filling weekly pill box   Pharmacist Clinical Goal(s):  Marland Kitchen Over the next 90 days, patient will demonstrate Improved medication adherence as evidenced by use of weekly complianc packaging from [place]. 05/12/19 re-established target goal date to 90 days due to treatment delays secondary to COVID-19  Interventions: 06/04/19: Completed call with patient and embedded RN CM Bridget Good  . Comprehensive medication review performed.  Marland Kitchen [facility] RN now filling pillbox--updated medication list and faxed to [facility] RN Bridget Good fax# [phone] last month . Requested new Rxs be sent to [place] to initiate pill packaging.  Task completed on 06/04/19. . Call placed to [place] to review medications with PharmD Bridget Good).  He will be able  to package 8 medications total: amlodipine/benaz, lexapro, vit D, ASA, clopidogrel, metoprolol, crestor, protonix).  Of note, Jardiance will NOT be packaged as patient receives samples from PCP office.  Patient assistance was attempted, however patient has been unable to complete application despite multiple attempts.  Total cost to patient per month with delivery is $45.99. Will check with patient and family to see if this is feasible . Patient and husband verbalize understanding & are in agreement with the care plan.  They state they will pick up pill packs before 5:30pm today. . Call placed to [place] to assist with smooth transition.  PharmD to provide education on pill packaging system and answer questions.  He will also be able to assist patient with setting up glucometer (Freestyle Lite) . Information discussed with CCM RN CM, Bridget Good  CCM RN CM Interventions:  06/04/19: Completed call with patient and embedded PharmD Bridget Good   . Provided Bridget Good with her new pharmacy information and advised her pill packaging system is ready to be picked up . [name] provided details related the cost of a 30 day supply of $45.99 that will be required to be paid upon pick up and or delivery . Instructed patient once she starts the new pill packaging system, she will no longer pull meds from her pill box . Instructed patient her Vania Rea will not be in the new pill pack due to she is getting samples from the office at this time . Discussed having the Rx for the freestyle glucometer faxed to the new pharmacy . Discussed patient will pick up  the glucometer and will begin self monitoring her CBG's at home asap . Attempted call to [facility] RN Bridget Good [phone] to provide patient update, no answer, unable to leave a voice message . Sent in basket message to provider Bridget Brine, Bridget Good requesting a prn nurse visit for tomorrow for reinforcement of use of new pill pack system and glucometer (to ensure  patient doesn't get confused and pull meds from her pill box in addition to her new pill pack) . Discussed plans with patient for ongoing care management follow up and provided patient with direct contact information for care management team  06/04/19 Inbound call from April RN from [place] at [facility]  . Discussed patient's transition to a pill pack system and new pharmacy . Discussed patient was instructed on use and was instructed to pick up the pill pack today along with her free style glucometer . Discussed the pharmacist will instruct patient and her spouse on how to use the pill pack and glucometer . Discussed provider Bridget Brine, Bridget Good has approved a prn visit for tomorrow for [facility] to oversee patient is properly using her new pill pack and disposing of her pill box . Encouraged nurse Bridget Good to contact the CCM with further questions or concerns  Patient Self Care Activities:  . Attends all scheduled provider appointments . Calls pharmacy for medication refills   Please see past updates related to this goal by clicking on the "Past Updates" button in the selected goal         Telephone follow up appointment with care management team member scheduled for: 06/11/19  Bridget Merino, RN, BSN, CCM Care Management Coordinator [place] [facility]  Direct Phone: [phone]

## 2019-06-05 ENCOUNTER — Ambulatory Visit: Payer: Self-pay

## 2019-06-05 ENCOUNTER — Other Ambulatory Visit: Payer: Self-pay

## 2019-06-05 DIAGNOSIS — I10 Essential (primary) hypertension: Secondary | ICD-10-CM | POA: Diagnosis not present

## 2019-06-05 DIAGNOSIS — F329 Major depressive disorder, single episode, unspecified: Secondary | ICD-10-CM | POA: Diagnosis not present

## 2019-06-05 DIAGNOSIS — E119 Type 2 diabetes mellitus without complications: Secondary | ICD-10-CM | POA: Diagnosis not present

## 2019-06-05 MED ORDER — GLUCOSE BLOOD VI STRP: ORAL_STRIP | 12 refills | Status: DC

## 2019-06-05 NOTE — Progress Notes (Signed)
  Chronic Care Management   Outreach Note  06/05/2019 Name: Bridget Good MRN: [id number] DOB: 1951/01/02  Referred by: [name], FNP Reason for referral : Chronic Care Management   An unsuccessful telephone outreach was attempted today. The patient was referred to the case management team by for assistance with chronic care management and care coordination.   Call placed to [place] where compliance packaging was set up for [name].  Pharmacist stated the patient & husband picked up pill packaging last night.  He explained the system and how to use.   Follow Up Plan: The care management team will reach out to the patient again over the next week.  Regina Eck, PharmD, BCPS Clinical Pharmacist, [facility] [place]: [phone]

## 2019-06-08 ENCOUNTER — Telehealth: Payer: Self-pay

## 2019-06-10 ENCOUNTER — Ambulatory Visit: Payer: Self-pay | Admitting: Pharmacist

## 2019-06-10 NOTE — Progress Notes (Signed)
  Chronic Care Management   Outreach Note  06/10/2019 Name: Bridget Good MRN: [id number] DOB: 1951-03-23  Referred by: [name], FNP Reason for referral : Chronic Care Management   An unsuccessful telephone outreach was attempted today. The patient was referred to the case management team by for assistance with chronic care management and care coordination.   Follow Up Plan: The care management team will reach out to the patient again over the next 3-5 business days.   Regina Eck, PharmD, BCPS Clinical Pharmacist, [facility] [place]: [phone]

## 2019-06-11 ENCOUNTER — Telehealth: Payer: Self-pay

## 2019-06-11 ENCOUNTER — Ambulatory Visit: Payer: Self-pay

## 2019-06-11 DIAGNOSIS — F32A Depression, unspecified: Secondary | ICD-10-CM

## 2019-06-11 DIAGNOSIS — I10 Essential (primary) hypertension: Secondary | ICD-10-CM

## 2019-06-11 DIAGNOSIS — E119 Type 2 diabetes mellitus without complications: Secondary | ICD-10-CM

## 2019-06-11 DIAGNOSIS — G3184 Mild cognitive impairment, so stated: Secondary | ICD-10-CM

## 2019-06-11 DIAGNOSIS — F329 Major depressive disorder, single episode, unspecified: Secondary | ICD-10-CM

## 2019-06-11 NOTE — Chronic Care Management (AMB) (Signed)
  Chronic Care Management   Outreach Note  06/11/2019 Name: Bridget Good MRN: [id number] DOB: [birth year]  Referred by: [name], FNP Reason for referral : Chronic Care Management (CCM RNCM Telephone Follow up)   An unsuccessful telephone outreach was attempted today. The patient was referred to the case management team by [name] FNP for assistance with chronic care management and care coordination.   Follow Up Plan: The care management team will reach out to the patient again over the next 5-7 days.   Barb Merino, RN, BSN, CCM Care Management Coordinator [place] [facility]  Direct Phone: [phone]

## 2019-06-11 NOTE — Telephone Encounter (Signed)
Patient called wanting to confirm her appointment for next week.  I RETURNED PT CALL AND UNABLE TO LEAVE V/M PHONE [place]

## 2019-06-15 ENCOUNTER — Other Ambulatory Visit: Payer: Self-pay | Admitting: Nurse Practitioner

## 2019-06-15 ENCOUNTER — Telehealth: Payer: Self-pay

## 2019-06-15 ENCOUNTER — Other Ambulatory Visit: Payer: Self-pay

## 2019-06-15 ENCOUNTER — Ambulatory Visit: Payer: Self-pay | Admitting: Pharmacist

## 2019-06-15 ENCOUNTER — Ambulatory Visit: Payer: Self-pay

## 2019-06-15 DIAGNOSIS — F419 Anxiety disorder, unspecified: Secondary | ICD-10-CM

## 2019-06-15 DIAGNOSIS — E119 Type 2 diabetes mellitus without complications: Secondary | ICD-10-CM

## 2019-06-15 DIAGNOSIS — F329 Major depressive disorder, single episode, unspecified: Secondary | ICD-10-CM

## 2019-06-15 DIAGNOSIS — I10 Essential (primary) hypertension: Secondary | ICD-10-CM

## 2019-06-15 DIAGNOSIS — I119 Hypertensive heart disease without heart failure: Secondary | ICD-10-CM

## 2019-06-15 DIAGNOSIS — G3184 Mild cognitive impairment, so stated: Secondary | ICD-10-CM

## 2019-06-15 DIAGNOSIS — F32A Depression, unspecified: Secondary | ICD-10-CM

## 2019-06-15 MED ORDER — FREESTYLE LANCETS MISC: 12 refills | Status: DC

## 2019-06-15 MED ORDER — GLUCOSE BLOOD VI STRP: ORAL_STRIP | 12 refills | Status: DC

## 2019-06-15 MED ORDER — PANTOPRAZOLE SODIUM 40 MG PO TBEC: DELAYED_RELEASE_TABLET | ORAL | 1 refills | Status: DC

## 2019-06-15 NOTE — Chronic Care Management (AMB) (Addendum)
Chronic Care Management   Follow Up Note   06/15/2019 Name: Bridget Good MRN: [id number] DOB: 12-Oct-1951  Referred by: [name], FNP Reason for referral : Chronic Care Management (CCM RNCM Telephonic Follow up )   GLENIS MUSOLF is a 68 y.o. year old female who is a primary care patient of [name], [place]. The CCM team was consulted for assistance with chronic disease management and care coordination needs.    Review of patient status, including review of consultants reports, relevant laboratory and other test results, and collaboration with appropriate care team members and the patient's provider was performed as part of comprehensive patient evaluation and provision of chronic care management services.   I spoke with Bridget Good by telephone today for a CCM follow up.    Goals Addressed      Patient Stated    "I don't know how to use my glucometer" (pt-stated)   Not on track    Current Barriers:   Knowledge Deficits related to how to correctly use freestyle glucometer   Knowledge Deficits related to Self Health management of Diabetes  Nurse Case Manager Clinical Goal(s):   Over the next 30 days, patient will verbalize understanding of plan for Self monitoring CBGs at home.   Goal Met   Over the next 90 days, patient will verbalize checking her CBG's at least once daily and will verbalize knowing how to self manage and or call her CCM team/PCP for hypo/hyperglycemic events. 05/12/19 re-established target goal date to 90 days due to COVID-19 treatment delays  CCM RN CM Interventions:  06/15/19: Call completed with patient    Confirmed patient did not pick up her glucometer from the pharmacy   Reinforced the importance of Self monitoring her BG levels for best management of her diabetes  Reviewed and discussed most recent A1C is elevated from 7.0 to 7.5; discussed target goal for A1C is <7.0  Discussed having [facility] deliver the [place] freestyle  glucometer to her home; discussed having the [facility] RN teach her how to use the glucometer during her next visit  Collaboration with embedded Pharm D [name] regarding plan to have glucometer delivered to patients home  Discussed plans with patient for ongoing care management follow up and provided patient with direct contact information for care management team -Patient verbalizes understanding and is agreeable to plan   Placed outbound call to April RN with [facility]; discussed Bridget Good will have her glucometer delivered to her home tomorrow by her pharmacy   Discussed [facility] RN will schedule her weekly visit with Bridget Good this week on Wednesday or Thursday in order to instruct Bridget Good on usage of her glucometer  Patient Self Care Activities:   Self administers medications with assistance from her family  Attends all scheduled provider appointments  Calls pharmacy for medication refills  Performs ADL's independently  Performs IADL's independently  Calls provider office for new concerns or questions  Please see past updates related to this goal by clicking on the "Past Updates" button in the selected goal        "I have some anxiety and depression" "I have misplaced my Sertraline" (pt-stated)   On track    Current Barriers:   Knowledge Deficits related to [place] [facility] for Anxiety and Depression   Cognitive Deficits, specifically related to recent change in memory  Financial Constraints  Nurse Case Manager Clinical Goal(s):   Over the next 30 days, patient will demonstrate improved adherence  to prescribed treatment plan for symptoms of depression and anxiety as evidenced bypatient will report taking her antidepressant exactly as prescribed and without missed doses. 05/12/19-Goal Not Met - patient is not taking the prescribed medication for her anxiety's   05/12/19: Over the next 30 days, patient will speak with PCP provider [name], FNP re: an  alternate treatment for anxiety.    CCM RN CM Interventions:  Completed call with patient on 05/12/19:    Received update from [facility] RN April from [place] [facility] that patient is not taking her Zoloft due to unwanted SE (dizziness and "feeling out of it")  In-basket message sent to provider [name], FNP with patient update  Internal collaboration with embedded PharmD [name] re: patient reported SE from [place]; [name] will send recommendations to provider [name], DNP, FNP  CM PharmD Interventions:  Completed call with patient on 05/22/19:    Recommended sertraline to Lexapro (escitalopram) due to fewer reported adverse effects.  Patient did not want to take sertraline because she was worried about side effects.  Lexapro will offer patient the ability to receive both anti-depressant effects and anti-anxiety effects.  Patient verbalizes understanding, however she has not picked up medication to begin taking.  Will continue to follow.  Patient Self Care Activities:   Self administers medications with assistance from her family  Attends all scheduled provider appointments  Calls pharmacy for medication refills  Performs ADL's independently  Performs IADL's independently  Calls provider office for new concerns or questions  Please see past updates related to this goal by clicking on the "Past Updates" button in the selected goal        "I have to use my magnifying glass to see my medicines" (pt-stated)       Current Barriers:   Knowledge Deficits related to impaired vision   Nurse Case Manager Clinical Goal(s):   Over the next 60 days, patient will attend all scheduled medical appointments: including her eye exam with [facility] scheduled for 06/22/19.   Interventions:  06/15/19 completed call with patient   Placed an outbound call to [facility] regarding follow-up appointment for patient's annual eye exam - patient has a follow-up exam  scheduled for 06/22/19 _0 :25 AM  Placed outbound call to patient to advise of her eye appointment; unable to reach patient on her cell phone; left a voice message for Mr. Marcello Good, requesting a return call from Bridget Good when possible  Received message from embedded Pharm D [name] advising Bridget Good returned her call and she was able to advise Bridget Good of her eye exam date/time and Bridget Good recorded the information on her calendar  Patient Self Care Activities:   Self administers medications as prescribed  Attends all scheduled provider appointments  Calls pharmacy for medication refills  Performs ADL's independently  Performs IADL's independently  Calls provider office for new concerns or questions  Please see past updates related to this goal by clicking on the "Past Updates" button in the selected goal        I would like to continue to control my BGs and keep my A1c<7% (pt-stated)   Not on track    Current Barriers:   Patient's current BG meter is malfunctioning-->requested new meter (completed)   Pharmacist Clinical Goal(s):   Over the next 14 days, patient will obtain free BG meter through insurance: FreeStyle, Precision or One Touch meters are preferred through [employer] Completed  Over the next 30 days,  patient will obtain new glucometer to be able to check & maintain BG with help from husband & Home Health RN. Goal Not Met   06/15/19: Over the next 30 days, patient will have received a new glucometer from [place] and will be able to comfortably self monitor her CBG's  06/15/19: Over the next 60 days, patient will have a running log of her BG readings, measuring and recording her readings at least once daily.   Interventions:  Discussed plans with patient for ongoing care management follow up and provided patient with direct contact information for care management team  Collaboration with provider re: medication management-->will request new  FreeStyle Lite per patient request. Completed on 05/11/19 per patient request  States she returned her glucometer to the pharmacy because she was uncomfortable using.  Discussed with CCM RN & RN suggested [facility] RN can provide instruction  Patient is adjusting to [facility] RN filling pill box.  She states it has been challenging because she doesn't recall which pills are in each slot.  Ensured her that [facility] RN would follow her med list and she could get her husband to double check.  Also, reminded patient to bring in her medications to appt if there were additional questions.   CCM RN CM Interventions:  06/15/19 call completed with patient    Verbal education provided to patient on the importance of monitoring her CBG's at least once daily to help determine what adjustments need to be made to her prescribed diabetic treatment plan  Reviewed with patient her most recent A1C has increased to 7.5; discussed the target A1C should be <7.0  Verbal education given related to potential complications related to untreated and or poorly managed diabetes  Discussed plan to have new [facility] pharmacy deliver her free style glucometer and allow the [facility] RN provide hands on training on how to properly use the glucometer  Discussed plans with patient for ongoing care management follow up and provided patient with direct contact information for care management team  Please see past updates related to this goal by clicking on the "Past Updates" button in the selected goal       I would like to organize my medications in order to have better adherence. (pt-stated)   On track    Current Barriers:   Non Adherence to prescribed medication regimen-->patient would like pill box to better organize medication.  She continues to have memory problems.  HH RN is filling weekly pill box   Pharmacist Clinical Goal(s):   Over the next 90 days, patient will demonstrate Improved medication adherence as evidenced by use of weekly  complianc packaging from [place]. 05/12/19 re-established target goal date to 90 days due to treatment delays secondary to COVID-19  CCM RN CM Interventions: 06/15/19: Completed call with patient    Discussed and reviewed patient's new medication pill package card system   Confirmed patient is able to self administer her medications without difficulty - she reports the new system is easier to use and she has not missed taking any doses  Discussed Bridget Good would like for her pill card to contain the indication for each prescribed medication   Discussed plans with patient for ongoing care management follow up and provided patient with direct contact information for care management team  Collaboration with embedded Pharm D [name] - she will speak with pharmacist to make this request  Further collaboration with [name] PharmD - discussed patient still needs to complete  the financial application for Jardiance - will plan to ask patient to stop by the [place] office this week for a face to face with [name] to assist with the Jardiance application process and pick up new Jardiance samples  Placed an outbound call to advise, left a vm on Mr. Collard's voicemail box requesting a return call   Placed an outbound call to April RN with [facility]; discussed Bridget Good had missed a couple of her doses of medications last week on Monday  Discussed Bridget Good is missing her 12 noon scheduled dose of Protonix  Discussed April RN will perform a pill count for patient's sample bottle of Jardiance to ensure she is remembering to take this medication since it is not included in her pill pack  Sent message to [name] PharmD and provider [name], FNP requesting patient's 12 noon Protonix scheduled be changed to an am or pm dose  Patient Self Care Activities:   Attends all scheduled provider appointments  Calls pharmacy for medication refills   Please see past  updates related to this goal by clicking on the "Past Updates" button in the selected goal       I would like to reduce my copay for Jardiance (pt-stated)   Not on track    Current Barriers:   Financial stress-copay for Jardiance   Pharmacist Clinical Goal(s):  Over the next 30 days, we will apply for patient assistance with [employer] [place]).  Based on review, patient will qualify.   Interventions:  Patient did not receive PAP packet. Delivered PAP application to patient's mail box at :[address], [place]  Provider portion of application received.  Encouraged patient to call PCP office for samples.  Last Jardiance samples picked up on 03/25/19.  Patient coming in to office on 05/14/19.  Reminded her to bring paperwork and benefit letter to appt in order to apply for assistance.  CCM RN CM Interventions:  06/15/19    Collaboration with [name] , embedded PharmD - discussed patient still needs to complete the financial application for Jardiance - will plan to ask patient to stop by the [place] office this week for a face to face with [name] to assist with the Jardiance application process and pick up new Jardiance samples  Placed an outbound call to advise patient, unable to reach patient on her cell phone #, left a vm on Mr. [place]'s voicemail box requesting a return call  Please see past updates related to this goal by clicking on the "Past Updates" button in the selected goal          The care management team will reach out to the patient again over the next 7-10 days.    Barb Merino, RN, BSN, CCM Care Management Coordinator [place] [facility]  Direct Phone: [phone]

## 2019-06-15 NOTE — Patient Instructions (Signed)
Visit Information  Goals Addressed      Patient Stated   . "I don't know how to use my glucometer" (pt-stated)   Not on track    Current Barriers:  Marland Kitchen Knowledge Deficits related to how to correctly use freestyle glucometer  . Knowledge Deficits related to Self Health management of Diabetes  Nurse Case Manager Clinical Goal(s):  Marland Kitchen Over the next 30 days, patient will verbalize understanding of plan for Self monitoring CBGs at home.   Goal Met  . Over the next 90 days, patient will verbalize checking her CBG's at least once daily and will verbalize knowing how to self manage and or call her CCM team/PCP for hypo/hyperglycemic events. 05/12/19 re-established target goal date to 90 days due to COVID-19 treatment delays  CCM RN CM Interventions:  06/15/19: Call completed with patient   . Confirmed patient did not pick up her glucometer from the pharmacy  . Reinforced the importance of Self monitoring her BG levels for best management of her diabetes . Reviewed and discussed most recent A1C is elevated from 7.0 to 7.5; discussed target goal for A1C is <7.0 . Discussed having [facility] deliver the [employer] to her home; discussed having the [facility] RN teach her how to use the glucometer during her next visit . Collaboration with embedded Pharm D [name] regarding plan to have glucometer delivered to patients home . Discussed plans with patient for ongoing care management follow up and provided patient with direct contact information for care management team -Patient verbalizes understanding and is agreeable to plan   Patient Self Care Activities:  . Self administers medications with assistance from her family . Attends all scheduled provider appointments . Calls pharmacy for medication refills . Performs ADL's independently . Performs IADL's independently . Calls provider office for new concerns or questions  Please see past updates related to this goal by clicking on  the "Past Updates" button in the selected goal       . "I have some anxiety and depression" "I have misplaced my Sertraline" (pt-stated)   On track    Current Barriers:  Marland Kitchen Knowledge Deficits related to Self Health Management for Anxiety and Depression  . Cognitive Deficits, specifically related to recent change in memory . Financial Constraints  Nurse Case Manager Clinical Goal(s):  Marland Kitchen Over the next 30 days, patient will demonstrate improved adherence to prescribed treatment plan for symptoms of depression and anxiety as evidenced bypatient will report taking her antidepressant exactly as prescribed and without missed doses. 05/12/19-Goal Not Met - patient is not taking the prescribed medication for her anxiety's  . 05/12/19: Over the next 30 days, patient will speak with PCP provider [name], FNP re: an alternate treatment for anxiety.    CCM RN CM Interventions:  Completed call with patient on 05/12/19:   . Received update from [facility] RN April from [facility] that patient is not taking her Zoloft due to unwanted SE (dizziness and "feeling out of it") . In-basket message sent to provider [name], FNP with patient update . Internal collaboration with embedded PharmD [name] re: patient reported SE from [place]; [name] will send recommendations to provider [name], DNP, FNP  CM PharmD Interventions:  Completed call with patient on 05/22/19:   . Recommended sertraline to Lexapro (escitalopram) due to fewer reported adverse effects.  Patient did not want to take sertraline because she was worried about side effects.  Lexapro will offer patient the ability to  receive both anti-depressant effects and anti-anxiety effects.  Patient verbalizes understanding, however she has not picked up medication to begin taking.  Will continue to follow.  Patient Self Care Activities:  . Self administers medications with assistance from her family . Attends all scheduled provider  appointments . Calls pharmacy for medication refills . Performs ADL's independently . Performs IADL's independently . Calls provider office for new concerns or questions  Please see past updates related to this goal by clicking on the "Past Updates" button in the selected goal       . "I have to use my magnifying glass to see my medicines" (pt-stated)       Current Barriers:  Marland Kitchen Knowledge Deficits related to impaired vision   Nurse Case Manager Clinical Goal(s):  Marland Kitchen Over the next 60 days, patient will attend all scheduled medical appointments: including her eye exam with [place] scheduled for 06/22/19.   Interventions:  06/15/19 completed call with patient  . Placed an outbound call to [facility] regarding follow-up appointment for patient's annual eye exam - patient has a follow-up exam scheduled for 06/22/19 '@9' :72 AM . Placed outbound call to patient to advise of her eye appointment; unable to reach patient on her cell phone; left a voice message for Mr. Marcello Moores, requesting a return call from [name] when possible  Patient Self Care Activities:  . Self administers medications as prescribed . Attends all scheduled provider appointments . Calls pharmacy for medication refills . Performs ADL's independently . Performs IADL's independently . Calls provider office for new concerns or questions  Please see past updates related to this goal by clicking on the "Past Updates" button in the selected goal       . I would like to continue to control my BGs and keep my A1c<7% (pt-stated)   Not on track    Current Barriers:   Patient's current BG meter is malfunctioning-->requested new meter (completed)   Pharmacist Clinical Goal(s):   Over the next 14 days, patient will obtain free BG meter through insurance: FreeStyle, Precision or One Touch meters are preferred through HTA Completed  Over the next 30 days, patient will obtain new glucometer to be able to check  & maintain BG with help from husband & [place]. Goal Not Met   06/15/19: Over the next 30 days, patient will have received a new glucometer from [place] and will be able to comfortably self monitor her CBG's  06/15/19: Over the next 60 days, patient will have a running log of her BG readings, measuring and recording her readings at least once daily.   Interventions:  Discussed plans with patient for ongoing care management follow up and provided patient with direct contact information for care management team  Collaboration with provider re: medication management-->will request new FreeStyle Lite per patient request. Completed on 05/11/19 per patient request  States she returned her glucometer to the pharmacy because she was uncomfortable using.  Discussed with CCM RN & RN suggested [facility] RN can provide instruction  Patient is adjusting to [facility] RN filling pill box.  She states it has been challenging because she doesn't recall which pills are in each slot.  Ensured her that [facility] RN would follow her med list and she could get her husband to double check.  Also, reminded patient to bring in her medications to appt if there were additional questions.   CCM RN CM Interventions:  06/15/19 call completed with patient   .  Verbal education provided to patient on the importance of monitoring her CBG's at least once daily to help determine what adjustments need to be made to her prescribed diabetic treatment plan . Reviewed with patient her most recent A1C has increased to 7.5; discussed the target A1C should be <7.0 . Verbal education given related to potential complications related to untreated and or poorly managed diabetes . Discussed plan to have new [facility] pharmacy deliver her free style glucometer and allow the [facility] RN provide hands on training on how to properly use the glucometer . Discussed plans with patient for ongoing care management follow up and provided patient with direct contact  information for care management team  Please see past updates related to this goal by clicking on the "Past Updates" button in the selected goal      . I would like to organize my medications in order to have better adherence. (pt-stated)   On track    Current Barriers:  . Non Adherence to prescribed medication regimen-->patient would like pill box to better organize medication.  She continues to have memory problems.  HH RN is filling weekly pill box   Pharmacist Clinical Goal(s):  Marland Kitchen Over the next 90 days, patient will demonstrate Improved medication adherence as evidenced by use of weekly complianc packaging from [place]. 05/12/19 re-established target goal date to 90 days due to treatment delays secondary to COVID-19  CCM RN CM Interventions: 06/15/19: Completed call with patient   . Discussed and reviewed patient's new medication pill package card system  . Confirmed patient is able to self administer her medications without difficulty - she reports the new system is easier to use and she has not missed taking any doses . Discussed Mrs. Delano would like for her pill card to contain the indication for each prescribed medication  . Discussed plans with patient for ongoing care management follow up and provided patient with direct contact information for care management team . Collaboration with embedded Pharm D [name] - she will speak with pharmacist to make this request . Further collaboration with [name] PharmD - discussed patient still needs to complete the financial application for Jardiance - will plan to ask patient to stop by the [place] office this week for a face to face with [name] to assist with the Jardiance application process and pick up new Jardiance samples . Placed an outbound call to advise, left a vm on Mr. Lacombe's voicemail box requesting a return call   Patient Self Care Activities:  . Attends all scheduled provider appointments . Calls  pharmacy for medication refills   Please see past updates related to this goal by clicking on the "Past Updates" button in the selected goal      . I would like to reduce my copay for Jardiance (pt-stated)   Not on track    Current Barriers:   Financial stress-copay for Jardiance   Pharmacist Clinical Goal(s):  Over the next 30 days, we will apply for patient assistance with [employer] [facility] [place]).  Based on review, patient will qualify.   Interventions:  Patient did not receive PAP packet. Delivered PAP application to patient's mail box at :[address] [place]  Provider portion of application received.  Encouraged patient to call PCP office for samples.  Last Jardiance samples picked up on 03/25/19.  Patient coming in to office on 05/14/19.  Reminded her to bring paperwork and benefit letter to appt in order to  apply for assistance.  CCM RN CM Interventions:  06/15/19   . Collaboration with [name] , embedded PharmD - discussed patient still needs to complete the financial application for Jardiance - will plan to ask patient to stop by the [place] office this week for a face to face with [name] to assist with the Jardiance application process and pick up new Jardiance samples . Placed an outbound call to advise patient, unable to reach patient on her cell phone #, left a vm on Mr. Legan's voicemail box requesting a return call  Please see past updates related to this goal by clicking on the "Past Updates" button in the selected goal        The patient verbalized understanding of instructions provided today and declined a print copy of patient instruction materials.   Telephone follow up appointment with care management team member scheduled for: 06/19/19  Barb Merino, RN, BSN, CCM Care Management Coordinator [facility] [facility]  Direct Phone: [phone]

## 2019-06-15 NOTE — Patient Instructions (Signed)
Visit Information  Goals Addressed            This Visit's Progress     Patient Stated   . I would like to continue to control my BGs and keep my A1c<7% (pt-stated)       Current Barriers:   Patient's current BG meter is malfunctioning-->requested new meter (completed)   Pharmacist Clinical Goal(s):   Over the next 14 days, patient will obtain free BG meter through insurance: FreeStyle, Precision or One Touch meters are preferred through HTA Completed  Over the next 30 days, patient will obtain new glucometer to be able to check & maintain BG with help from husband & [place]. Goal Not Met   06/15/19: Over the next 30 days, patient will have received a new glucometer from [place] and will be able to comfortably self monitor her CBG's  06/15/19: Over the next 60 days, patient will have a running log of her BG readings, measuring and recording her readings at least once daily.   CCM PharmD Interventions: 06/15/19 call completed with patient  Discussed plans with patient for ongoing care management follow up and provided patient with direct contact information for care management team  Collaboration with provider re: medication management-->requested RX for test strips, lancets.  Pharmacist needed new instructions on RX for billing purposes.  Meter and supplies will be delivered to patient tomorrow.  Patient and pharmacist verbalized understanding  States she returned her glucometer to the pharmacy because she was uncomfortable using.  Discussed with CCM RN & RN suggested [facility] RN can provide instruction  Patient has 2 bottles left of Jardiance samples.  Instructed patient to call office for additional samples and attempt ot fill out assistance applications.   CCM RN CM Interventions:  06/15/19 call completed with patient   . Verbal education provided to patient on the importance of monitoring her CBG's at least once daily to help determine what adjustments need to be  made to her prescribed diabetic treatment plan . Reviewed with patient her most recent A1C has increased to 7.5; discussed the target A1C should be <7.0 . Verbal education given related to potential complications related to untreated and or poorly managed diabetes . Discussed plan to have new [facility] pharmacy deliver her free style glucometer and allow the [facility] RN provide hands on training on how to properly use the glucometer . Discussed plans with patient for ongoing care management follow up and provided patient with direct contact information for care management team  Please see past updates related to this goal by clicking on the "Past Updates" button in the selected goal      . I would like to organize my medications in order to have better adherence. (pt-stated)       Current Barriers:  . Non Adherence to prescribed medication regimen-->patient would like pill box to better organize medication.  She continues to have memory problems.  HH RN is filling weekly pill box   Pharmacist Clinical Goal(s):  Marland Kitchen Over the next 90 days, patient will demonstrate Improved medication adherence as evidenced by use of weekly complianc packaging from [place]. 05/12/19 re-established target goal date to 90 days due to treatment delays secondary to COVID-19  CCM RN CM Interventions: 06/15/19: Completed call with patient   . Discussed and reviewed patient's new medication pill package card system  . Confirmed patient is able to self administer her medications without difficulty - she reports the new system is  easier to use and she has not missed taking any doses . Discussed Mrs. Haman would like for her pill card to contain the indication for each prescribed medication  . Discussed plans with patient for ongoing care management follow up and provided patient with direct contact information for care management team . Collaboration with embedded Pharm D [name] - she will speak with  pharmacist to make this request . Further collaboration with [name] PharmD - discussed patient still needs to complete the financial application for Jardiance - will plan to ask patient to stop by the [place] office this week for a face to face with [name] to assist with the Jardiance application process and pick up new Jardiance samples . Placed an outbound call to advise, left a vm on Mr. Kocian's voicemail box requesting a return call    CCM PharmD Interventions: 06/15/19: Completed call with patient   . Patient states that she likes her new pill packaging system.  She is adjusting to it well.  She states she has been taking the medication in her pill packs as directed.  She states she would like the indications written on the pill packaging system so she would be able to tell what the medication is for.  Will discuss with PharmD at [facility]  . Discussed with [name] CCM RN CM that [facility] RN could potentially write in indications for medications. . Will continue to follow.  Patient Self Care Activities:  . Attends all scheduled provider appointments . Calls pharmacy for medication refills   Please see past updates related to this goal by clicking on the "Past Updates" button in the selected goal      . I would like to reduce my copay for Jardiance (pt-stated)       Current Barriers:   Financial stress-copay for Jardiance   Pharmacist Clinical Goal(s):  Over the next 90 days, we will apply for patient assistance with [employer] [place]).  Based on review, patient will qualify.   Interventions: 06/15/19   Collaboration with CCM RN, [place] and patient  Patient did not receive PAP packet. Delivered PAP application to patient's mail box at :[address] [place]  Provider portion of application received.  Encouraged patient to call PCP office for samples when needed.  She states she has 2 bottles remaining.  She has missed a few doses of  Jardiance last week.  Last Jardiance samples picked up in June.    Reminded patient to ask her husband and home health RN to help her with paperwork.  Also, encouraged patient to come to office to see CCM PharmD to help assist her to bring paperwork and benefit letter to appt in order to apply for assistance.  CCM RN CM Interventions:  06/15/19   . Collaboration with [name] , embedded PharmD - discussed patient still needs to complete the financial application for Jardiance - will plan to ask patient to stop by the [place] office this week for a face to face with [name] to assist with the Jardiance application process and pick up new Jardiance samples . Placed an outbound call to advise patient, unable to reach patient on her cell phone #, left a vm on Mr. Lazcano's voicemail box requesting a return call  Please see past updates related to this goal by clicking on the "Past Updates" button in the selected goal          The patient verbalized understanding of instructions provided today  and declined a print copy of patient instruction materials.   The care management team will reach out to the patient again over the next 7 business days.   Regina Eck, PharmD, BCPS Clinical Pharmacist, [facility] [place]: [phone]

## 2019-06-15 NOTE — Progress Notes (Signed)
Chronic Care Management   Visit Note  06/15/2019 Name: Bridget Good MRN: [id number] DOB: 1951/11/14  Referred by: [name], FNP Reason for referral : Chronic Care Management   Bridget Good is a 68 y.o. year old female who is a primary care patient of [name], [place]. The CCM team was consulted for assistance with chronic disease management and care coordination needs.   Review of patient status, including review of consultants reports, relevant laboratory and other test results, and collaboration with appropriate care team members and the patient's provider was performed as part of comprehensive patient evaluation and provision of chronic care management services.    I spoke with Bridget Good by telephone today.  Objective:   Goals Addressed            This Visit's Progress     Patient Stated   . I would like to continue to control my BGs and keep my A1c<7% (pt-stated)       Current Barriers:   Patient's current BG meter is malfunctioning-->requested new meter (completed)   Pharmacist Clinical Goal(s):   Over the next 14 days, patient will obtain free BG meter through insurance: FreeStyle, Precision or One Touch meters are preferred through [employer] Completed  Over the next 30 days, patient will obtain new glucometer to be able to check & maintain BG with help from husband & [name]. Goal Not Met   06/15/19: Over the next 30 days, patient will have received a new glucometer from [place] and will be able to comfortably self monitor her CBG's  06/15/19: Over the next 60 days, patient will have a running log of her BG readings, measuring and recording her readings at least once daily.   CCM PharmD Interventions: 06/15/19 call completed with patient  Discussed plans with patient for ongoing care management follow up and provided patient with direct contact information for care management team  Collaboration with provider re: medication  management-->requested RX for test strips, lancets.  Pharmacist needed new instructions on RX for billing purposes.  Meter and supplies will be delivered to patient tomorrow.  Patient and pharmacist verbalized understanding  States she returned her glucometer to the pharmacy because she was uncomfortable using.  Discussed with CCM RN & RN suggested [name] can provide instruction  Patient has 2 bottles left of Jardiance samples.  Instructed patient to call office for additional samples and attempt ot fill out assistance applications.   CCM RN CM Interventions:  06/15/19 call completed with patient   . Verbal education provided to patient on the importance of monitoring her CBG's at least once daily to help determine what adjustments need to be made to her prescribed diabetic treatment plan . Reviewed with patient her most recent A1C has increased to 7.5; discussed the target A1C should be <7.0 . Verbal education given related to potential complications related to untreated and or poorly managed diabetes . Discussed plan to have new [facility] pharmacy deliver her free style glucometer and allow the [name] provide hands on training on how to properly use the glucometer . Discussed plans with patient for ongoing care management follow up and provided patient with direct contact information for care management team  Please see past updates related to this goal by clicking on the "Past Updates" button in the selected goal      . I would like to organize my medications in order to have better adherence. (pt-stated)       Current  Barriers:  . Non Adherence to prescribed medication regimen-->patient would like pill box to better organize medication.  She continues to have memory problems.  HH RN is filling weekly pill box   Pharmacist Clinical Goal(s):  Marland Kitchen Over the next 90 days, patient will demonstrate Improved medication adherence as evidenced by use of weekly complianc packaging from [name]. 05/12/19 re-established target goal date to 90 days due to treatment delays secondary to COVID-19  CCM RN CM Interventions: 06/15/19: Completed call with patient   . Discussed and reviewed patient's new medication pill package card system  . Confirmed patient is able to self administer her medications without difficulty - she reports the new system is easier to use and she has not missed taking any doses . Discussed Bridget Good would like for her pill card to contain the indication for each prescribed medication  . Discussed plans with patient for ongoing care management follow up and provided patient with direct contact information for care management team . Collaboration with embedded Pharm D [name] - she will speak with pharmacist to make this request . Further collaboration with [name] PharmD - discussed patient still needs to complete the financial application for Jardiance - will plan to ask patient to stop by the [place] office this week for a face to face with [name] to assist with the Jardiance application process and pick up new Jardiance samples . Placed an outbound call to advise, left a vm on [name]'s voicemail box requesting a return call    CCM PharmD Interventions: 06/15/19: Completed call with patient   . Patient states that she likes her new pill packaging system.  She is adjusting to it well.  She states she has been taking the medication in her pill packs as directed.  She states she would like the indications written on the pill packaging system so she would be able to tell what the medication is for.  Will discuss with PharmD at [facility]  . Discussed with [name] CCM RN CM that [facility] RN could potentially write in indications for medications. . Will continue to follow.  Patient Self Care Activities:  . Attends all scheduled provider appointments . Calls pharmacy for medication refills   Please see past updates related to this goal by clicking on  the "Past Updates" button in the selected goal      . I would like to reduce my copay for Jardiance (pt-stated)       Current Barriers:   Financial stress-copay for Jardiance   Pharmacist Clinical Goal(s):  Over the next 90 days, we will apply for patient assistance with [employer] [place]).  Based on review, patient will qualify.   Interventions: 06/15/19   Collaboration with CCM RN, [place] and patient  Patient did not receive PAP packet. Delivered PAP application to patient's mail box at :[address] [place]  Provider portion of application received.  Encouraged patient to call PCP office for samples when needed.  She states she has 2 bottles remaining.  She has missed a few doses of Jardiance last week.  Last Jardiance samples picked up in June.    Reminded patient to ask her husband and home health RN to help her with paperwork.  Also, encouraged patient to come to office to see CCM PharmD to help assist her to bring paperwork and benefit letter to appt in order to apply for assistance.  CCM RN CM Interventions:  06/15/19   .  Collaboration with [name] , embedded PharmD - discussed patient still needs to complete the financial application for Jardiance - will plan to ask patient to stop by the [place] office this week for a face to face with [name] to assist with the Jardiance application process and pick up new Jardiance samples . Placed an outbound call to advise patient, unable to reach patient on her cell phone #, left a vm on Mr. Donatelli's voicemail box requesting a return call  Please see past updates related to this goal by clicking on the "Past Updates" button in the selected goal           Plan:   The care management team will reach out to the patient again over the next 7 business days.   Regina Eck, PharmD, BCPS Clinical Pharmacist, [facility] [place]: [phone]

## 2019-06-16 ENCOUNTER — Other Ambulatory Visit: Payer: Self-pay

## 2019-06-16 ENCOUNTER — Telehealth: Payer: Self-pay

## 2019-06-16 ENCOUNTER — Ambulatory Visit (INDEPENDENT_AMBULATORY_CARE_PROVIDER_SITE_OTHER): Payer: PPO | Admitting: Neurology

## 2019-06-16 ENCOUNTER — Encounter: Payer: Self-pay | Admitting: Neurology

## 2019-06-16 ENCOUNTER — Other Ambulatory Visit: Payer: Self-pay | Admitting: Nurse Practitioner

## 2019-06-16 VITALS — BP 135/91 | HR 73 | Temp 99.4°F | Ht 63.0 in | Wt 182.0 lb

## 2019-06-16 DIAGNOSIS — E119 Type 2 diabetes mellitus without complications: Secondary | ICD-10-CM

## 2019-06-16 DIAGNOSIS — G3184 Mild cognitive impairment, so stated: Secondary | ICD-10-CM

## 2019-06-16 MED ORDER — PANTOPRAZOLE SODIUM 40 MG PO TBEC: DELAYED_RELEASE_TABLET | ORAL | 1 refills | Status: DC

## 2019-06-16 MED ORDER — GLUCOSE BLOOD VI STRP: ORAL_STRIP | 12 refills | Status: DC

## 2019-06-16 NOTE — Patient Instructions (Signed)

## 2019-06-16 NOTE — Progress Notes (Signed)
SLEEP MEDICINE CLINIC   Provider:  Larey Seat, MD   Primary Care Physician:  Bridget Good, [place] Referring Provider: [name], FNP   Chief Complaint  Patient presents with   New Patient (Initial Visit)    pt with husband,rm 10.  can't sleep well at night, noted more memory problems and feels irritable from not sleeping. She snores and wakes up gasping for air.     HPI:  Bridget Good is a 68 y.o. female patient, seen today on follow up on her sleep study , Today is  06-16-2019 , the PSG took place  The polysomnography was performed on 29 July 2018, and the patient did not have any significant sleep apnea at the time nor periodic limb movements.  She did normal EKG 2 there was some sinus bradycardia but no tachycardia.  She slept over 6 hours without fragmentation of sleep.  The only recommendation I had at the time was to avoid sleeping in supine as snoring was louder in that position and may interrupt her fragmented sleep.  At least during the sleep lab stay there was no fragmentation.  I wanted to make sure that I recap her apnea hypopnea index it was 4.4 and her REM sleep AHI was 12.6.  We meet today because the patient has been rereferred for concerns of cognitive function or dysfunction.  My nurse performed a Mini-Mental status test today with Bridget Good and she scored 24 out of 30 points which is lower than expected.  It was really is a short-term memory that is most affected orientation abstraction and following multitasks looked fine.  She was also well able to do attention and calculation.  She had developed some daytime sleepiness, was ' groggy' on melatonin. I am not sure which dose she took.   her daughter brought her her : " Mother os more forgetful, frustrated, simple tasks, misplacing items"  The patient is worked up for sarcoidosis, lupus, and was cleared of cancer. Her husband had retired also to take care of her and became depressed, grumpy.  That seems to  became an issue in quarantine times, cooped up together   she never filled or took the ZOLOFT I recommended.   MMSE - Mini Mental State Exam 06/16/2019  Orientation to time 4  Orientation to Place 5  Registration 3  Attention/ Calculation 5  Recall 0  Language- name 2 objects 2  Language- repeat 1  Language- follow 3 step command 2  Language- read & follow direction 0  Write a sentence 1  Copy design 1  Total score 24    How likely are you to doze in the following situations: 0 = not likely, 1 = slight chance, 2 = moderate chance, 3 = high chance  Sitting and Reading? Watching Television? Sitting inactive in a public place (theater or meeting)? Lying down in the afternoon when circumstances permit? Sitting and talking to someone? Sitting quietly after lunch without alcohol? In a car, while stopped for a few minutes in traffic? As a passenger in a car for an hour without a break?  Total = 17 points on 06-16-2019.   She was last seen in 2019 for a sleep evaluation. She has had a a turbulent year. Patient has been seen by oncology/ hematology. She has a stroke in 07-12-2018. She was at home in bed, got up to the bathroom and had diplopia. She went to hospital and was diagnosed with a stroke.  As I had explained  last year there were a lot of social and psychological pressures on the [name], and I had felt that her sleep may have partially been impaired by this.  However she had seen the oncologist Dr. Zoila Good, who stated on 11/27/2018 that the patient had a bone lesion noted on an MRI in August 2019 with a stroke history about being on Plavix.  An SPEP was negative, the infarct of the abnormal marrow signal of the clivus was compared to a study from 2016 and not found.  The oncologist worked her up for possible  myeloma but did not find evidence for this. Oncology also set the patient up for skeletal survey which has not been yet done.  She also wanted to refer her again to  neurology for evaluation and follow-up, but there is no need for stroke follow-up at this time.  The patient already had an evaluation in the sleep clinic. She was then seen by oncologist Bridget Good at the time presented with very high blood pressures 198/115 in summary Dr. Mickeal Good: referral to Dr. Venetia Good for Biopsy.   IMPRESSION: 1. 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 Findings may represent metastasis, myeloma, or possibly invasive skull base tumor. Inflammatory process such as Paget's is also possible. 2. No acute intracranial abnormality. Stable chronic microvascular ischemic changes, volume loss, and small chronic infarcts of the brain.   Electronically Signed   By: Bridget Good M.D.   On: 01/07/2019 22:55   She was later referred to me by PCP and Dr. Jaynee Eagles, MD for a sleep study after stroke.:   IMPRESSION of her sleep study :  1. No clinically significant degree of Obstructive Sleep Apnea (OSA), Periodic Limb Movement Disorder (PLMD) and only mild- moderate Snoring. No hypoxemia.  2. Normal EKG rhythm with some sinus bradycardia ( not on beta blocker).  3. Over 6 hours of sleep time without fragmentation.  4. Early REM onset at less than 30 minutes was unusual. (There are no other symptoms of narcolepsy endorsed, and Epworth score is less than 14 / 24, fatigue is not high).   Bridget Seat, MD     08-04-2018   Bridget Good is a 68 year old right-handed African-American female patient seen here in the presence of her husband.  She used to see Dr. Romana Good for many years until he retired and has now established primary care with tried another medicine.  Her past medical history is positive for hypertension, hyperlipidemia and diabetes mellitus.  She also has complaints about anxiety headaches and recently developed less restorative sleep and problems with insomnia. She feels under a lot of stress. Her husband is  retired- the couple now runs a Animal nutritionist business. Her husband had resigned after 25 years from a church, after a conflict with the leadership.    Sleep habits are as follows:  Dinner time is around 6 -7 Pm, and working through books and fianances of there company, often watching TV. Sometimes she sleeps on the sofa.   Bed time is around 11.30 after the late news. She takes night time medication- Tylenol or ibuprofen.  The bedroom is relatively quiet, dark and cool. She sleeps on her back and on 2 pillows, she snores, intermittently. Nocturia once at the most.  Cannot recall dreaming at all. She wakes spontaneously between 7 and 8 and rises. Her knee is stiff.  Some morning she wakes with headaches, not from headaches.  Rare naps. Less than 7 hours  of nocturnal sleep.    Sleep medical history and family sleep history: NP moore has ordered a rheumatological panel, RA factor was positive at 15.6 u/ml. Vit D deficiency. Chol 211, creatinine above 1.    Social history:  Non smoker, non drinker, caffeine use - iced tea and sodas- up to 2 glasses a day.   Review of Systems: Out of a complete 14 system review, the patient complains of only the following symptoms, and all other reviewed systems are negative.  Insomnia, snoring. Depression.    Social History   Socioeconomic History   Marital status: Married    Spouse name: Not on file   Number of children: 2   Years of education: Not on file   Highest education level: Not on file  Occupational History   Not on file  Social Needs   Financial resource strain: Somewhat hard   Food insecurity    Worry: Sometimes true    Inability: Never true   Transportation needs    Medical: No    Non-medical: No  Tobacco Use   Smoking status: Never Smoker   Smokeless tobacco: Never Used  Substance and Sexual Activity   Alcohol use: No    Alcohol/week: 0.0 standard drinks   Drug use: No   Sexual activity: Yes    Partners: Male    Birth  control/protection: Surgical  Lifestyle   Physical activity    Days per week: Not on file    Minutes per session: Not on file   Stress: Rather much  Relationships   Social connections    Talks on phone: More than three times a week    Gets together: More than three times a week    Attends religious service: More than 4 times per year    Active member of club or organization: Yes    Attends meetings of clubs or organizations: More than 4 times per year    Relationship status: Married   Intimate partner violence    Fear of current or ex partner: No    Emotionally abused: No    Physically abused: No    Forced sexual activity: No  Other Topics Concern   Not on file  Social History Narrative   Married.  Husband is psychotherapist.  Several children.  Husband is Theme park manager of [employer] in [place].    Family History  Problem Relation Age of Onset   Heart attack Father    Diabetes Father    Hypertension Mother    Heart attack Brother    Kidney failure Brother    Diabetes Sister    Diabetes Sister     Past Medical History:  Diagnosis Date   Angina    Anxiety    Bronchitis    Depression    Diabetes mellitus    Fibromyalgia    GERD (gastroesophageal reflux disease)    Headache(784.0)    Hyperlipidemia    Hypertension    Hypertensive heart disease without CHF    Lupus ([place])    "treated for it from 1992 til 2012; dr said I don't have it anymore"   Obesity (BMI 30-39.9)    Osteoarthritis    Pneumonia    Shortness of breath    lying down, upon exertion   Shortness of breath on exertion    Stroke ([place])    2019    Past Surgical History:  Procedure Laterality Date   ABDOMINAL HYSTERECTOMY     partial   CARDIAC CATHETERIZATION  ~  2007   CESAREAN SECTION  1978; 1981   COLONOSCOPY     CRANIOTOMY N/A 02/27/2019   Procedure: Endonasal Endoscopic biopsy of clival mass;  Surgeon: Judith Part, MD;  Location: [facility];  Service:  Neurosurgery;  Laterality: N/A;  Endonasal Endoscopic biopsy of clival mass   ENDOSCOPIC TRANS NASAL APPROACH WITH FUSION N/A 02/27/2019   Procedure: ENDOSCOPIC TRANS NASAL APPROACH WITH FUSION;  Surgeon: Judith Part, MD;  Location: [facility];  Service: Neurosurgery;  Laterality: N/A;  ENDOSCOPIC TRANS NASAL APPROACH WITH FUSION   FOOT SURGERY     "had to cut it to let the fluids out; it had swollen very badly; left foot"    Current Outpatient Medications  Medication Sig Dispense Refill   acetaminophen (TYLENOL) 500 MG tablet Take 1 tablet (500 mg total) by mouth as needed for mild pain. 90 tablet 1   amLODipine-benazepril (LOTREL) 10-40 MG capsule Take 1 capsule by mouth daily. 90 capsule 1   aspirin EC 81 MG tablet Take 1 tablet (81 mg total) by mouth daily. 90 tablet 1   blood glucose meter kit and supplies Dispense based on patient and insurance preference. Use up to four times daily as directed. (FOR ICD-10 E10.9, E11.9). 1 each 0   Blood Glucose Monitoring Suppl (FREESTYLE LITE) DEVI Check blood sugars twice daily 1 each 1   ciclopirox (PENLAC) 8 % solution Apply topically at bedtime. Apply over nail and surrounding skin. Apply daily over previous coat. Remove weekly with polish remover. (Patient taking differently: Apply 1 application topically at bedtime. Apply over nail and surrounding skin. Apply daily over previous coat. Remove weekly with polish remover.) 6.6 mL 11   clopidogrel (PLAVIX) 75 MG tablet Take 1 tablet (75 mg total) by mouth daily. 90 tablet 1   empagliflozin (JARDIANCE) 10 MG TABS tablet Take 10 mg by mouth daily. 90 tablet 3   escitalopram (LEXAPRO) 5 MG tablet Take 1 tablet (5 mg total) by mouth at bedtime. 90 tablet 1   fluticasone (FLONASE) 50 MCG/ACT nasal spray Place into both nostrils daily.     Ginsengs-Royal Jelly-Vit B12 (GINSENG ROYAL JELLY PLUS) 375-1-15 MG-MG-MCG CAPS Take 1 capsule by mouth daily. 90 capsule 1   glucose blood test strip Use  as instructed 100 each 12   glucose blood test strip Use as instructed 100 each 12   ketoconazole (NIZORAL) 2 % cream Apply to both feet and between toes once daily for 6 weeks 30 g 1   Lancets (FREESTYLE) lancets Use as instructed 100 each 12   metoprolol succinate (TOPROL-XL) 50 MG 24 hr tablet Take 1 tablet (50 mg total) by mouth daily. Take with or immediately following a meal. 90 tablet 1   nitroGLYCERIN (NITROSTAT) 0.4 MG SL tablet Place 1 tablet (0.4 mg total) under the tongue every 5 (five) minutes as needed for chest pain. 25 tablet 12   pantoprazole (PROTONIX) 40 MG tablet Take one tablet by mouth daily in AM 90 tablet 1   potassium chloride SA (K-DUR) 20 MEQ tablet Take 1 tablet (20 mEq total) by mouth daily. 90 tablet 1   rosuvastatin (CRESTOR) 20 MG tablet Take 1 tablet (20 mg total) by mouth daily. 90 tablet 1   Vitamin D, Ergocalciferol, (DRISDOL) 1.25 MG (50000 UT) CAPS capsule Take 1 capsule (50,000 Units total) by mouth 2 (two) times a week. 24 capsule 1   No current facility-administered medications for this visit.     Allergies as of 06/16/2019 -  Review Complete 05/18/2019  Allergen Reaction Noted   Sulfa antibiotics Hives, Itching, and Nausea And Vomiting 12/30/2011    Vitals: BP (!) 135/91    Pulse 73    Temp 99.4 F (37.4 C)    Ht _0  (1.6 m)    Wt 182 lb (82.6 kg)    BMI 32.24 kg/m  Last Weight:  Wt Readings from Last 1 Encounters:  06/16/19 182 lb (82.6 kg)   QQI:WLNL mass index is 32.24 kg/m.     Last Height:   Ht Readings from Last 1 Encounters:  06/16/19 _1  (1.6 m)    Physical exam:  General: The patient is awake, alert and appears not in acute distress. The patient is well groomed. Head: Normocephalic, atraumatic. Neck is supple. Mallampati 4,  neck circumference:14. Nasal airflow patent ,Retrognathia is strongly seen.  Cardiovascular:  Regular rate and rhythm, without  murmurs or carotid bruit, and without distended neck  veins. Respiratory: Lungs are clear to auscultation. Skin:  Without evidence of edema, or rash Trunk: BMI is 28.3. The patient's posture is erect   Neurologic exam : The patient is awake and alert, oriented to place and time.   Memory subjective described as intact.  Attention span & concentration ability appears normal.  Speech is fluent, without dysarthria, dysphonia or aphasia.  Mood and affect are depressed, sad, worried. .  Cranial nerves: Pupils are equal and briskly reactive to light.  Funduscopic exam without evidence of pallor or edema.  Extraocular movements  in vertical and horizontal planes intact and without nystagmus. Visual fields by finger perimetry are intact. Hearing to finger rub intact. Facial sensation intact to fine touch.  Facial motor strength is symmetric , the tongue and uvula move midline. Shoulder shrug was symmetrical.   Motor exam:  Normal tone, muscle bulk and symmetric strength in all extremities. Weak grip.  Sensory:  Fine touch, pinprick and vibration were tested in all extremities. Proprioception tested in the upper extremities was normal. Coordination: handwriting changes. Finger-to-nose maneuver  normal without evidence of ataxia, dysmetria or tremor. Gait and station: Patient walks without assistive device and is able unassisted to climb up to the exam table. Strength within normal limits. Stance is stable and normal. Turns with 3 Steps.   Deep tendon reflexes: in the upper and lower extremities are symmetric and intact.  Assessment:  After physical and neurologic examination, review of laboratory studies,  Personal review of imaging studies, reports of other /same  Imaging studies, results of polysomnography and / or neurophysiology testing and pre-existing records as far as provided in visit., my assessment is   1) insomnia can be stress related - medical issues, worried about a possible cancer, etc. anxiety-  About the findings on MRI brain.  She  learnt she doesn't have cancer and this is a relief.  2) snoring- and hypersomnia- consider a dental device.  3) depression, sadness, likely pseudodementia.I like for her to have  Counseling.   The patient was advised of the nature of the diagnosed anxiety disorder , the treatment options for insomnia and headaches.  She has not taken Zoloft.  I spent more than 35 minutes of face to face time with the patient.  Greater than 50% of time was spent in counseling and coordination of care. We have discussed the diagnosis and differential and I answered the patient's questions.    Plan:  Treatment plan and additional workup :  I would strongly recommend anxiety/ depression treatment. Trial of SSRI.  Zoloft. Recommend melatonin 5 mg or less.  Prn trazodone.   Rv with Np prn for repeat MMSE or MOCA in 5-6 month.     Bridget Seat, MD 04/16/637, 6:85 PM  Certified in Neurology by ABPN Certified in [place] by [facility] [address] [place], [place] [address]

## 2019-06-19 ENCOUNTER — Telehealth: Payer: Self-pay

## 2019-06-25 ENCOUNTER — Ambulatory Visit: Payer: Self-pay | Admitting: Pharmacist

## 2019-06-25 DIAGNOSIS — E119 Type 2 diabetes mellitus without complications: Secondary | ICD-10-CM

## 2019-06-25 DIAGNOSIS — I119 Hypertensive heart disease without heart failure: Secondary | ICD-10-CM

## 2019-06-25 DIAGNOSIS — I10 Essential (primary) hypertension: Secondary | ICD-10-CM

## 2019-06-26 ENCOUNTER — Other Ambulatory Visit: Payer: Self-pay | Admitting: Pharmacist

## 2019-06-26 NOTE — Progress Notes (Signed)
  Chronic Care Management   Outreach Note  06/26/2019 Name: Bridget Good MRN: [id number] DOB: Aug 23, 1951  Referred by: [name], FNP Reason for referral : [place].   An unsuccessful telephone outreach was attempted today. The patient was referred to the case management team by for assistance with chronic care management and care coordination.  Patient's voicemail was full, therefore unable to leave message.   Follow Up Plan: The care management team will reach out to the patient again  next week.  Regina Eck, PharmD, BCPS Clinical Pharmacist, [facility] [place]: [phone]

## 2019-06-27 NOTE — Patient Instructions (Signed)
Visit Information  Goals Addressed            This Visit's Progress     Patient Stated   . "I have some anxiety and depression" "I have misplaced my Sertraline" (pt-stated)       Current Barriers:  Marland Kitchen Knowledge Deficits related to Self Health Management for Anxiety and Depression  . Cognitive Deficits, specifically related to recent change in memory . Financial Constraints  Nurse Case Manager Clinical Goal(s):  Marland Kitchen Over the next 30 days, patient will demonstrate improved adherence to prescribed treatment plan for symptoms of depression and anxiety as evidenced bypatient will report taking her antidepressant exactly as prescribed and without missed doses. 05/12/19-Goal Not Met - patient is not taking the prescribed medication for her anxiety's  . 05/12/19: Over the next 30 days, patient will speak with PCP provider [name], FNP re: an alternate treatment for anxiety.  (GOAL MET)  CCM RN CM Interventions:  Completed call with patient on 05/12/19:   . Received update from [facility] RN April from [facility] that patient is not taking her Zoloft due to unwanted SE (dizziness and "feeling out of it") . In-basket message sent to provider [name], FNP with patient update . Internal collaboration with embedded PharmD [name] re: patient reported SE from August; [name] will send recommendations to provider [name], DNP, FNP  CM PharmD Interventions:  Completed call with patient on 06/25/19:   . Recommended sertraline to Lexapro (escitalopram) due to fewer reported adverse effects.  Patient did not want to take sertraline because she was worried about side effects.  Lexapro will offer patient the ability to receive both anti-depressant effects and anti-anxiety effects.   . Patient has now been taking Lexapro x3 weeks and states she does have a "better overall feeling".  She appeared to be in a better mood and carried on a cheerful conversation.  Patient will continue to exhibit  compliance while using compliance packaging.  Will continue to follow.  Patient Self Care Activities:  . Self administers medications with assistance from her family . Attends all scheduled provider appointments . Calls pharmacy for medication refills . Performs ADL's independently . Performs IADL's independently . Calls provider office for new concerns or questions  Please see past updates related to this goal by clicking on the "Past Updates" button in the selected goal       . I would like to continue to control my BGs and keep my A1c<7% (pt-stated)       Current Barriers:   Patient's current BG meter is malfunctioning-->requested new meter (completed)   Pharmacist Clinical Goal(s):   Over the next 14 days, patient will obtain free BG meter through insurance: FreeStyle, Precision or One Touch meters are preferred through HTA Completed  Over the next 30 days, patient will obtain new glucometer to be able to check & maintain BG with help from husband & [place]. Goal Not Met   06/15/19: Over the next 30 days, patient will have received a new glucometer from [place] and will be able to comfortably self monitor her CBG's  06/15/19: Over the next 60 days, patient will have a running log of her BG readings, measuring and recording her readings at least once daily.   CCM PharmD Interventions: 06/25/19 call completed with patient  Discussed plans with patient for ongoing care management follow up and provided patient with direct contact information for care management team  Collaboration with provider re: medication  management-->requested RX for test strips, lancets.  Pharmacist needed new instructions on RX for billing purposes.  Meter and supplies will be delivered to patient tomorrow.  Patient and pharmacist verbalized understanding  Patient has received new glucometer, however she has still not been able to successfully check blood sugar.  She states she has been  trying to use it, but does not feel comfortable yet.  Patient has a Building surveyor.  Encouraged patient to come to clinic on Monday or Wednesday of next week to set up glucometer.   CCM RN CM Interventions:  06/15/19 call completed with patient   . Verbal education provided to patient on the importance of monitoring her CBG's at least once daily to help determine what adjustments need to be made to her prescribed diabetic treatment plan . Reviewed with patient her most recent A1C has increased to 7.5; discussed the target A1C should be <7.0 . Verbal education given related to potential complications related to untreated and or poorly managed diabetes . Discussed plan to have new [facility] pharmacy deliver her free style glucometer and allow the [facility] RN provide hands on training on how to properly use the glucometer . Discussed plans with patient for ongoing care management follow up and provided patient with direct contact information for care management team  Please see past updates related to this goal by clicking on the "Past Updates" button in the selected goal      . I would like to organize my medications in order to have better adherence. (pt-stated)       Current Barriers:  . Non Adherence to prescribed medication regimen-->patient would like pill box to better organize medication.  She continues to have memory problems.  HH RN is filling weekly pill box   Pharmacist Clinical Goal(s):  Marland Kitchen Over the next 90 days, patient will demonstrate Improved medication adherence as evidenced by use of weekly complianc packaging from [place]. 05/12/19 re-established target goal date to 90 days due to treatment delays secondary to COVID-19  CCM RN CM Interventions: 06/15/19: Completed call with patient   . Discussed and reviewed patient's new medication pill package card system  . Confirmed patient is able to self administer her medications without difficulty - she  reports the new system is easier to use and she has not missed taking any doses . Discussed Mrs. Jennings would like for her pill card to contain the indication for each prescribed medication  . Discussed plans with patient for ongoing care management follow up and provided patient with direct contact information for care management team . Collaboration with embedded Pharm D [name] - she will speak with pharmacist to make this request . Further collaboration with [name] PharmD - discussed patient still needs to complete the financial application for Jardiance - will plan to ask patient to stop by the [place] office this week for a face to face with [name] to assist with the Jardiance application process and pick up new Jardiance samples . Placed an outbound call to advise, left a vm on Mr. Riano's voicemail box requesting a return call   Placed an outbound call to April RN with [facility]; discussed Mrs. Tuff had missed a couple of her doses of medications last week on Monday  Discussed Mrs. Stumpp is missing her 12 noon scheduled dose of Protonix  Discussed April RN will perform a pill count for patient's sample bottle of Jardiance to ensure she is remembering to take this  medication since it is not included in her pill pack  Sent message to [name] PharmD and provider [name], FNP requesting patient's 12 noon Protonix scheduled be changed to an am or pm dose   CCM PharmD Interventions: 06/25/19: Completed call with patient   . Patient states that she likes her new pill packaging system.  She is adjusting to it well.  She states she has been taking the medication in her pill packs as directed.  She states she would like the indications written on the pill packaging system so she would be able to tell what the medication is for.  Discussed with PharmD at [facility].  Her next packs are due in 1 week.  Will follow up with this request . Discussed case with Barb Merino, CCM  RN CM.  Appreciate collaboration. . Patient has 1 more week left of Jardiance.  She states she and her husband will come by the office to see if samples are available next week.  Vania Rea is not packagad with her other medications as pharmacy is unable to package outside medications due to liability reasons. . Patient did receive new glucometer, (Freestyle lite), however she still does not how to use it well.  Encouraged patient to come by office next week. . Patient complains of bruise on left breast.  Discussed case with [name], PCP.  Patient denies pain & tenderness.  She states there are no other bruises on her body.  She denies falls or signs and symptoms of bleeding.  She states nothing else appears wrong other than she has a bruise on her left breast.  Encourage patient to schedule appointment with PCP if she felt this was of concern. . Will continue to follow.  Patient Self Care Activities:  . Attends all scheduled provider appointments . Calls pharmacy for medication refills   Please see past updates related to this goal by clicking on the "Past Updates" button in the selected goal      . I would like to reduce my copay for Jardiance (pt-stated)       Current Barriers:   Financial stress-copay for Jardiance   Pharmacist Clinical Goal(s):  Over the next 90 days, we will apply for patient assistance with [employer] of [place]).  Based on review, patient will qualify.   Interventions: 06/25/19- Successful telephone outreach with Ms. [place] with CCM RN, Dealer and patient  PAP application delivered to [employer] at:  [place], [place]  Provider portion of application received.  Encouraged patient to call PCP office for samples next week.  She has 1 week of samples remaining.  Reminded patient, CCM PharmD can assist with applying for financial assistance for Jardiance when she comes to office.  Financial  information would need to be provided.  CCM RN CM Interventions:  06/15/19   . Collaboration with [name] , embedded PharmD - discussed patient still needs to complete the financial application for Jardiance - will plan to ask patient to stop by the [place] office this week for a face to face with [name] to assist with the Jardiance application process and pick up new Jardiance samples . Placed an outbound call to advise patient, unable to reach patient on her cell phone #, left a vm on Mr. Mcginnis's voicemail box requesting a return call  Please see past updates related to this goal by clicking on the "Past Updates" button in the selected goal  The patient verbalized understanding of instructions provided today and declined a print copy of patient instruction materials.   The care management team will reach out to the patient again over the next 7 business days.   Regina Eck, PharmD, BCPS Clinical Pharmacist, [facility] [place]: [phone]

## 2019-06-27 NOTE — Progress Notes (Signed)
Chronic Care Management   Visit Note  06/25/2019 Name: Bridget Good MRN: [id number] DOB: 1951-11-07  Referred by: [name], FNP Reason for referral : Chronic Care Management   Bridget Good is a 68 y.o. year old female who is a primary care patient of [name], [place]. The CCM team was consulted for assistance with chronic disease management and care coordination needs.   Review of patient status, including review of consultants reports, relevant laboratory and other test results, and collaboration with appropriate care team members and the patient's provider was performed as part of comprehensive patient evaluation and provision of chronic care management services.    I spoke with [name] by telephone today.  Objective:   Goals Addressed            This Visit's Progress     Patient Stated    "I have some anxiety and depression" "I have misplaced my Sertraline" (pt-stated)       Current Barriers:   Knowledge Deficits related to Self Health Management for Anxiety and Depression   Cognitive Deficits, specifically related to recent change in memory  Financial Constraints  Nurse Case Manager Clinical Goal(s):   Over the next 30 days, patient will demonstrate improved adherence to prescribed treatment plan for symptoms of depression and anxiety as evidenced bypatient will report taking her antidepressant exactly as prescribed and without missed doses. 05/12/19-Goal Not Met - patient is not taking the prescribed medication for her anxiety's   05/12/19: Over the next 30 days, patient will speak with PCP provider [name], FNP re: an alternate treatment for anxiety.  (GOAL MET)  CCM RN CM Interventions:  Completed call with patient on 05/12/19:    Received update from [facility] RN April from [facility] that patient is not taking her Zoloft due to unwanted SE (dizziness and "feeling out of it")  In-basket message sent to provider [name], FNP with  patient update  Internal collaboration with embedded PharmD [name] re: patient reported SE from [place]; [name] will send recommendations to provider [name], DNP, FNP  CM PharmD Interventions:  Completed call with patient on 06/25/19:    Recommended sertraline to Lexapro (escitalopram) due to fewer reported adverse effects.  Patient did not want to take sertraline because she was worried about side effects.  Lexapro will offer patient the ability to receive both anti-depressant effects and anti-anxiety effects.    Patient has now been taking Lexapro x3 weeks and states she does have a "better overall feeling".  She appeared to be in a better mood and carried on a cheerful conversation.  Patient will continue to exhibit compliance while using compliance packaging.  Will continue to follow.  Patient Self Care Activities:   Self administers medications with assistance from her family  Attends all scheduled provider appointments  Calls pharmacy for medication refills  Performs ADL's independently  Performs IADL's independently  Calls provider office for new concerns or questions  Please see past updates related to this goal by clicking on the "Past Updates" button in the selected goal        I would like to continue to control my BGs and keep my A1c<7% (pt-stated)       Current Barriers:   Patient's current BG meter is malfunctioning-->requested new meter (completed)   Pharmacist Clinical Goal(s):   Over the next 14 days, patient will obtain free BG meter through insurance: FreeStyle, Precision or One Touch meters are preferred through HTA  Completed  Over the next 30 days, patient will obtain new glucometer to be able to check & maintain BG with help from husband & Home Health RN. Goal Not Met   06/15/19: Over the next 30 days, patient will have received a new glucometer from [place] and will be able to comfortably self monitor her CBG's  06/15/19: Over the  next 60 days, patient will have a running log of her BG readings, measuring and recording her readings at least once daily.   CCM PharmD Interventions: 06/25/19 call completed with patient  Discussed plans with patient for ongoing care management follow up and provided patient with direct contact information for care management team  Collaboration with provider re: medication management-->requested RX for test strips, lancets.  Pharmacist needed new instructions on RX for billing purposes.  Meter and supplies will be delivered to patient tomorrow.  Patient and pharmacist verbalized understanding  Patient has received new glucometer, however she has still not been able to successfully check blood sugar.  She states she has been trying to use it, but does not feel comfortable yet.  Patient has a Building surveyor.  Encouraged patient to come to clinic on Monday or Wednesday of next week to set up glucometer.   CCM RN CM Interventions:  06/15/19 call completed with patient    Verbal education provided to patient on the importance of monitoring her CBG's at least once daily to help determine what adjustments need to be made to her prescribed diabetic treatment plan  Reviewed with patient her most recent A1C has increased to 7.5; discussed the target A1C should be <7.0  Verbal education given related to potential complications related to untreated and or poorly managed diabetes  Discussed plan to have new [facility] pharmacy deliver her free style glucometer and allow the [facility] RN provide hands on training on how to properly use the glucometer  Discussed plans with patient for ongoing care management follow up and provided patient with direct contact information for care management team  Please see past updates related to this goal by clicking on the "Past Updates" button in the selected goal       I would like to organize my medications in order to have better adherence. (pt-stated)        Current Barriers:   Non Adherence to prescribed medication regimen-->patient would like pill box to better organize medication.  She continues to have memory problems.  HH RN is filling weekly pill box   Pharmacist Clinical Goal(s):   Over the next 90 days, patient will demonstrate Improved medication adherence as evidenced by use of weekly complianc packaging from [place]. 05/12/19 re-established target goal date to 90 days due to treatment delays secondary to COVID-19  CCM RN CM Interventions: 06/15/19: Completed call with patient    Discussed and reviewed patient's new medication pill package card system   Confirmed patient is able to self administer her medications without difficulty - she reports the new system is easier to use and she has not missed taking any doses  Discussed Mrs. Renfrow would like for her pill card to contain the indication for each prescribed medication   Discussed plans with patient for ongoing care management follow up and provided patient with direct contact information for care management team  Collaboration with embedded Pharm D [name] - she will speak with pharmacist to make this request  Further collaboration with [name] PharmD - discussed patient still needs to complete  the financial application for Jardiance - will plan to ask patient to stop by the [place] office this week for a face to face with [name] to assist with the Jardiance application process and pick up new Jardiance samples  Placed an outbound call to advise, left a vm on Mr. Leiner's voicemail box requesting a return call   Placed an outbound call to April RN with [facility]; discussed Mrs. [place] had missed a couple of her doses of medications last week on Monday  Discussed Mrs. Battle is missing her 12 noon scheduled dose of Protonix  Discussed April RN will perform a pill count for patient's sample bottle of Jardiance to ensure she is remembering to take  this medication since it is not included in her pill pack  Sent message to [name] PharmD and provider [name], FNP requesting patient's 12 noon Protonix scheduled be changed to an am or pm dose   CCM PharmD Interventions: 06/25/19: Completed call with patient    Patient states that she likes her new pill packaging system.  She is adjusting to it well.  She states she has been taking the medication in her pill packs as directed.  She states she would like the indications written on the pill packaging system so she would be able to tell what the medication is for.  Discussed with PharmD at [facility].  Her next packs are due in 1 week.  Will follow up with this request  Discussed case with Barb Merino, CCM RN CM.  Appreciate collaboration.  Patient has 1 more week left of Jardiance.  She states she and her husband will come by the office to see if samples are available next week.  Vania Rea is not packagad with her other medications as pharmacy is unable to package outside medications due to liability reasons.  Patient did receive new glucometer, (Freestyle lite), however she still does not how to use it well.  Encouraged patient to come by office next week.  Patient complains of bruise on left breast.  Discussed case with [name], PCP.  Patient denies pain & tenderness.  She states there are no other bruises on her body.  She denies falls or signs and symptoms of bleeding.  She states nothing else appears wrong other than she has a bruise on her left breast.  Encourage patient to schedule appointment with PCP if she felt this was of concern.  Will continue to follow.  Patient Self Care Activities:   Attends all scheduled provider appointments  Calls pharmacy for medication refills   Please see past updates related to this goal by clicking on the "Past Updates" button in the selected goal       I would like to reduce my copay for Jardiance (pt-stated)       Current  Barriers:   Financial stress-copay for Jardiance   Pharmacist Clinical Goal(s):  Over the next 90 days, we will apply for patient assistance with [employer] of [place]).  Based on review, patient will qualify.   Interventions: 06/25/19- Successful telephone outreach with Ms. [place] with CCM RN, Dealer and patient  PAP application delivered to [employer] at:  [place], [place]  Provider portion of application received.  Encouraged patient to call PCP office for samples next week.  She has 1 week of samples remaining.  Reminded patient, CCM PharmD can assist with applying for financial assistance for Jardiance when she comes to office.  Financial information would need to be provided.  CCM RN CM Interventions:  06/15/19    Collaboration with [name] , embedded PharmD - discussed patient still needs to complete the financial application for Jardiance - will plan to ask patient to stop by the [place] office this week for a face to face with [name] to assist with the Jardiance application process and pick up new Jardiance samples  Placed an outbound call to advise patient, unable to reach patient on her cell phone #, left a vm on Mr. Virgo's voicemail box requesting a return call  Please see past updates related to this goal by clicking on the "Past Updates" button in the selected goal           Plan:   The care management team will reach out to the patient again over the next 7 business days.   Regina Eck, PharmD, BCPS Clinical Pharmacist, [facility] [place]: [phone]

## 2019-06-29 ENCOUNTER — Ambulatory Visit: Payer: Self-pay | Admitting: Pharmacist

## 2019-06-29 NOTE — Progress Notes (Signed)
  Chronic Care Management   Outreach Note  06/29/2019 Name: Bridget Good MRN: [id number] DOB: 12-Nov-1951  Referred by: [name], FNP Reason for referral : Chronic Care Management   An unsuccessful telephone outreach was attempted today. The patient was referred to the case management team by for assistance with chronic care management and care coordination.   Follow Up Plan: The care management team will reach out to the patient again over the next 3-5 business days.    Regina Eck, PharmD, BCPS Clinical Pharmacist, [facility] [place]: [phone]

## 2019-07-02 ENCOUNTER — Telehealth: Payer: Self-pay | Admitting: Pharmacist

## 2019-07-08 ENCOUNTER — Other Ambulatory Visit: Payer: Self-pay

## 2019-07-08 DIAGNOSIS — Z20822 Contact with and (suspected) exposure to covid-19: Secondary | ICD-10-CM

## 2019-07-09 LAB — NOVEL CORONAVIRUS, NAA: SARS-CoV-2, NAA: NOT DETECTED

## 2019-07-15 ENCOUNTER — Ambulatory Visit: Payer: Self-pay | Admitting: Pharmacist

## 2019-07-15 DIAGNOSIS — I119 Hypertensive heart disease without heart failure: Secondary | ICD-10-CM

## 2019-07-15 DIAGNOSIS — E119 Type 2 diabetes mellitus without complications: Secondary | ICD-10-CM

## 2019-07-15 DIAGNOSIS — I10 Essential (primary) hypertension: Secondary | ICD-10-CM

## 2019-07-17 NOTE — Progress Notes (Signed)
Chronic Care Management   Visit Note  07/15/2019 Name: Bridget Good MRN: [id number] DOB: [birth year]  Referred by: [name], FNP Reason for referral : Chronic Care Management   Bridget Good is a 68 y.o. year old female who is a primary care patient of [name], [place]. The CCM team was consulted for assistance with chronic disease management and care coordination needs.   Review of patient status, including review of consultants reports, relevant laboratory and other test results, and collaboration with appropriate care team members and the patient's provider was performed as part of comprehensive patient evaluation and provision of chronic care management services.    I spoke with Bridget Good by telephone today.  Objective:   Goals Addressed            This Visit's Progress     Patient Stated   . I would like to organize my medications in order to have better adherence. (pt-stated)       Current Barriers:  . Non Adherence to prescribed medication regimen-->patient would like pill box to better organize medication.  She continues to have memory problems.  HH RN is filling weekly pill box   Pharmacist Clinical Goal(s):  Marland Kitchen Over the next 90 days, patient will demonstrate Improved medication adherence as evidenced by use of weekly complianc packaging from [place]. 05/12/19 re-established target goal date to 90 days due to treatment delays secondary to COVID-19  CCM PharmD Interventions: 07/15/19: Completed call with patient   . Patient states that she is continuing to use and adapts to her new pill packaging system (provided by [employer]).  She is adjusting to it well.  She states she has been taking the medication in her pill packs as directed.  She states she would like the indications written on the pill packaging system so she would be able to tell what the medication is for.  Discussed with PharmD at [facility].  Her next monthly  packaging has been delivered. . Discussed case with Bridget Good, CCM RN CM.  Appreciate collaboration. Bridget Good samples left up front for patient to get.  She has one more day of Jardiance remaining.  She states she and her husband will come by the office tomorrow.  Bridget Good is not packagad with her other medications as pharmacy is unable to package outside medications due to liability reasons. . Patient did receive new glucometer, (Freestyle lite), however she still does not how to use it well.  Encouraged patient to come by PCP office any Wednesday to program with PharmD. Marland Kitchen Patient states new Lexapro is working.  She feels better overall and is having less anxiety and depression.  Encouraged patient to continue taking this medication as prescribed. . Will continue to follow.  Patient Self Care Activities:  . Attends all scheduled provider appointments . Calls pharmacy for medication refills   Please see past updates related to this goal by clicking on the "Past Updates" button in the selected goal      . COMPLETED: I would like to reduce my copay for Jardiance (pt-stated)       Current Barriers:   Financial stress-copay for Jardiance   Pharmacist Clinical Goal(s):  Over the next 90 days, we will apply for patient assistance with [place] [employer] [place]).  Based on review, patient will qualify.   Interventions: 07/15/19- Successful telephone outreach with Bridget Good with CCM RN, [employer] and patient  Encouraged patient to  pick up samples of Jardiance from PCP office on Thursday, August 6th.   Reminded patient, CCM PharmD can assist with applying for financial assistance for Jardiance when she comes to office.  Financial information would need to be provided.  CCM RN CM Interventions:  06/15/19   . Collaboration with [name] , embedded PharmD - discussed patient still needs to complete the financial application for Jardiance - will plan  to ask patient to stop by the [place] office this week for a face to face with [name] to assist with the Jardiance application process and pick up new Jardiance samples . Placed an outbound call to advise patient, unable to reach patient on her cell phone #, left a vm on Bridget Good's voicemail box requesting a return call  Please see past updates related to this goal by clicking on the "Past Updates" button in the selected goal       Plan:   The care management team will reach out to the patient again over the next 4 weeks.  Regina Eck, PharmD, BCPS Clinical Pharmacist, [facility] [place]: [phone]

## 2019-07-17 NOTE — Patient Instructions (Signed)
Visit Information  Goals Addressed            This Visit's Progress     Patient Stated   . I would like to organize my medications in order to have better adherence. (pt-stated)       Current Barriers:  . Non Adherence to prescribed medication regimen-->patient would like pill box to better organize medication.  She continues to have memory problems.  HH RN is filling weekly pill box   Pharmacist Clinical Goal(s):  Marland Kitchen Over the next 90 days, patient will demonstrate Improved medication adherence as evidenced by use of weekly complianc packaging from [place]. 05/12/19 re-established target goal date to 90 days due to treatment delays secondary to COVID-19  CCM RN CM Interventions: 06/15/19: Completed call with patient   . Discussed and reviewed patient's new medication pill package card system  . Confirmed patient is able to self administer her medications without difficulty - she reports the new system is easier to use and she has not missed taking any doses . Discussed Mrs. Bridget Good would like for her pill card to contain the indication for each prescribed medication  . Discussed plans with patient for ongoing care management follow up and provided patient with direct contact information for care management team . Collaboration with embedded Pharm D [name] - she will speak with pharmacist to make this request . Further collaboration with [name] PharmD - discussed patient still needs to complete the financial application for Jardiance - will plan to ask patient to stop by the [place] office this week for a face to face with [name] to assist with the Jardiance application process and pick up new Jardiance samples . Placed an outbound call to advise, left a vm on Mr. San's voicemail box requesting a return call   Placed an outbound call to April RN with [facility]; discussed Mrs. Bridget Good had missed a couple of her doses of medications last week on  Monday  Discussed Mrs. Bridget Good is missing her 12 noon scheduled dose of Protonix  Discussed April RN will perform a pill count for patient's sample bottle of Jardiance to ensure she is remembering to take this medication since it is not included in her pill pack  Sent message to [name] PharmD and provider [name], FNP requesting patient's 12 noon Protonix scheduled be changed to an am or pm dose   CCM PharmD Interventions: 07/15/19: Completed call with patient   . Patient states that she is continuing to use and adapts to her new pill packaging system (provided by [employer]).  She is adjusting to it well.  She states she has been taking the medication in her pill packs as directed.  She states she would like the indications written on the pill packaging system so she would be able to tell what the medication is for.  Discussed with PharmD at [facility].  Her next monthly packaging has been delivered. . Discussed case with Barb Merino, CCM RN CM.  Appreciate collaboration. Vania Rea samples left up front for patient to get.  She has one more day of Jardiance remaining.  She states she and her husband will come by the office tomorrow.  Vania Rea is not packagad with her other medications as pharmacy is unable to package outside medications due to liability reasons. . Patient did receive new glucometer, (Freestyle lite), however she still does not how to use it well.  Encouraged patient to come by PCP  office any Wednesday to program with PharmD. Marland Kitchen Patient states new Lexapro is working.  She feels better overall and is having less anxiety and depression.  Encouraged patient to continue taking this medication as prescribed. . Will continue to follow.  Patient Self Care Activities:  . Attends all scheduled provider appointments . Calls pharmacy for medication refills   Please see past updates related to this goal by clicking on the "Past Updates" button in the selected goal       . COMPLETED: I would like to reduce my copay for Jardiance (pt-stated)       Current Barriers:   Financial stress-copay for Jardiance   Pharmacist Clinical Goal(s):  Over the next 90 days, we will apply for patient assistance with [place] [employer] [place]).  Based on review, patient will qualify.   Interventions: 07/15/19- Successful telephone outreach with [name] with CCM RN, [employer] and patient  Encouraged patient to pick up samples of Jardiance from PCP office on Thursday, August 6th.   Reminded patient, CCM PharmD can assist with applying for financial assistance for Jardiance when she comes to office.  Financial information would need to be provided.  CCM RN CM Interventions:  06/15/19   . Collaboration with [name] , embedded PharmD - discussed patient still needs to complete the financial application for Jardiance - will plan to ask patient to stop by the [place] office this week for a face to face with [name] to assist with the Jardiance application process and pick up new Jardiance samples . Placed an outbound call to advise patient, unable to reach patient on her cell phone #, left a vm on Mr. Bober's voicemail box requesting a return call  Please see past updates related to this goal by clicking on the "Past Updates" button in the selected goal          The patient verbalized understanding of instructions provided today and declined a print copy of patient instruction materials.   The care management team will reach out to the patient again over the next 4 weeks.  Regina Eck, PharmD, BCPS Clinical Pharmacist, [facility] [place]: [phone]

## 2019-07-22 ENCOUNTER — Ambulatory Visit (INDEPENDENT_AMBULATORY_CARE_PROVIDER_SITE_OTHER): Payer: PPO | Admitting: Pharmacist

## 2019-07-22 ENCOUNTER — Ambulatory Visit: Payer: Self-pay

## 2019-07-22 ENCOUNTER — Telehealth: Payer: Self-pay

## 2019-07-22 DIAGNOSIS — I10 Essential (primary) hypertension: Secondary | ICD-10-CM | POA: Diagnosis not present

## 2019-07-22 DIAGNOSIS — F32A Depression, unspecified: Secondary | ICD-10-CM

## 2019-07-22 DIAGNOSIS — I119 Hypertensive heart disease without heart failure: Secondary | ICD-10-CM

## 2019-07-22 DIAGNOSIS — G3184 Mild cognitive impairment, so stated: Secondary | ICD-10-CM

## 2019-07-22 DIAGNOSIS — G9689 Other specified disorders of central nervous system: Secondary | ICD-10-CM

## 2019-07-22 DIAGNOSIS — F329 Major depressive disorder, single episode, unspecified: Secondary | ICD-10-CM

## 2019-07-22 DIAGNOSIS — E119 Type 2 diabetes mellitus without complications: Secondary | ICD-10-CM

## 2019-07-22 NOTE — Chronic Care Management (AMB) (Signed)
  Chronic Care Management   Outreach Note  07/22/2019 Name: Bridget Good MRN: [id number] DOB: 1951-02-27  Referred by: [name], FNP Reason for referral : Chronic Care Management (CCM RNCM Telephone Follow up )   An unsuccessful telephone outreach was attempted today. The patient was referred to the case management team by [name] FNP for assistance with chronic care management and care coordination.   Follow Up Plan: A HIPPA compliant phone message was left for the patient providing contact information and requesting a return call.  Telephone follow up appointment with care management team member scheduled for: 08/04/19   Barb Merino, RN, BSN, CCM Care Management Coordinator [place] [facility]  Direct Phone: [phone]

## 2019-07-24 NOTE — Progress Notes (Signed)
Chronic Care Management   Visit Note  07/22/2019 Name: Bridget Good MRN: [id number] DOB: 1951-08-17  Referred by: [name], FNP Reason for referral : Chronic Care Management   Bridget Good is a 68 y.o. year old female who is a primary care patient of [name], [place]. The CCM team was consulted for assistance with chronic disease management and care coordination needs.   Review of patient status, including review of consultants reports, relevant laboratory and other test results, and collaboration with appropriate care team members and the patient's provider was performed as part of comprehensive patient evaluation and provision of chronic care management services.    I spoke with Bridget Good by telephone today.  Objective:   Goals Addressed            This Visit's Progress     Patient Stated   . I would like to organize my medications in order to have better adherence. (pt-stated)       Current Barriers:  . Non Adherence to prescribed medication regimen-->patient would like pill box to better organize medication.  She continues to have memory problems.  HH RN is filling weekly pill box   Pharmacist Clinical Goal(s):  Marland Kitchen Over the next 90 days, patient will demonstrate Improved medication adherence as evidenced by use of weekly complianc packaging from [place]. 05/12/19 re-established target goal date to 90 days due to treatment delays secondary to COVID-19  CCM RN CM Interventions: 06/15/19: Completed call with patient   . Discussed and reviewed patient's new medication pill package card system  . Confirmed patient is able to self administer her medications without difficulty - she reports the new system is easier to use and she has not missed taking any doses . Discussed Bridget Good would like for her pill card to contain the indication for each prescribed medication  . Discussed plans with patient for ongoing care management follow up  and provided patient with direct contact information for care management team . Collaboration with embedded Pharm D [name] - she will speak with pharmacist to make this request . Further collaboration with [name] PharmD - discussed patient still needs to complete the financial application for Jardiance - will plan to ask patient to stop by the [place] office this week for a face to face with [name] to assist with the Jardiance application process and pick up new Jardiance samples . Placed an outbound call to advise, left a vm on Mr. Good's voicemail box requesting a return call   Placed an outbound call to April RN with [facility]; discussed Bridget Good had missed a couple of her doses of medications last week on Monday  Discussed Bridget Good is missing her 12 noon scheduled dose of Protonix  Discussed April RN will perform a pill count for patient's sample bottle of Jardiance to ensure she is remembering to take this medication since it is not included in her pill pack  Sent message to [name] PharmD and provider [name], FNP requesting patient's 12 noon Protonix scheduled be changed to an am or pm dose   CCM PharmD Interventions: 07/22/19: Completed call with patient   . Patient states that she is continuing to use and adapts to her new pill packaging system (provided by [employer]).  She is adjusting to it well.  She states she has been taking the medication in her pill packs as directed.  She states she would like the indications written  on the pill packaging system so she would be able to tell what the medication is for.  Discussed with PharmD at [facility].  Her next monthly packaging has been delivered. . Discussed case with Barb Merino, CCM RN CM.  Appreciate collaboration. Vania Rea samples left up front for patient to get.  She has one more day of Jardiance remaining.  Patient has still not picked up Jardiance samples, however reminded patient to pick up  again.  Vania Rea is not packagad with her other medications as pharmacy is unable to package outside medications due to liability reasons. . Patient did receive new glucometer, (Freestyle lite), however she still does not how to use it well.  Encouraged patient to come by PCP office any Wednesday to program with PharmD. Marland Kitchen Patient states new Lexapro is working.  She feels better overall and is having less anxiety and depression.  Encouraged patient to continue taking this medication as prescribed.  Her overall disposition on the telephone has improved. . Will continue to follow.  Patient Self Care Activities:  . Attends all scheduled provider appointments . Calls pharmacy for medication refills   Please see past updates related to this goal by clicking on the "Past Updates" button in the selected goal          Plan:   The care management team will reach out to the patient again over the next 2-4 weeks.  Regina Eck, PharmD, BCPS Clinical Pharmacist, [facility] [employer]: [phone]

## 2019-07-24 NOTE — Patient Instructions (Signed)
Visit Information  Goals Addressed            This Visit's Progress     Patient Stated   . I would like to organize my medications in order to have better adherence. (pt-stated)       Current Barriers:  . Non Adherence to prescribed medication regimen-->patient would like pill box to better organize medication.  She continues to have memory problems.  HH RN is filling weekly pill box   Pharmacist Clinical Goal(s):  Marland Kitchen Over the next 90 days, patient will demonstrate Improved medication adherence as evidenced by use of weekly complianc packaging from [place]. 05/12/19 re-established target goal date to 90 days due to treatment delays secondary to COVID-19  CCM RN CM Interventions: 06/15/19: Completed call with patient   . Discussed and reviewed patient's new medication pill package card system  . Confirmed patient is able to self administer her medications without difficulty - she reports the new system is easier to use and she has not missed taking any doses . Discussed [name] would like for her pill card to contain the indication for each prescribed medication  . Discussed plans with patient for ongoing care management follow up and provided patient with direct contact information for care management team . Collaboration with embedded Pharm D [name] - she will speak with pharmacist to make this request . Further collaboration with [name] PharmD - discussed patient still needs to complete the financial application for Jardiance - will plan to ask patient to stop by the [place] office this week for a face to face with [name] to assist with the Jardiance application process and pick up new Jardiance samples . Placed an outbound call to advise, left a vm on [name]'s voicemail box requesting a return call   Placed an outbound call to April RN with [facility]; discussed [name] had missed a couple of her doses of medications last week on  Monday  Discussed [name] is missing her 12 noon scheduled dose of Protonix  Discussed April RN will perform a pill count for patient's sample bottle of Jardiance to ensure she is remembering to take this medication since it is not included in her pill pack  Sent message to [name] PharmD and provider [name], FNP requesting patient's 12 noon Protonix scheduled be changed to an am or pm dose   CCM PharmD Interventions: 07/22/19: Completed call with patient   . Patient states that she is continuing to use and adapts to her new pill packaging system (provided by [employer]).  She is adjusting to it well.  She states she has been taking the medication in her pill packs as directed.  She states she would like the indications written on the pill packaging system so she would be able to tell what the medication is for.  Discussed with PharmD at [facility].  Her next monthly packaging has been delivered. . Discussed case with Barb Merino, CCM RN CM.  Appreciate collaboration. Vania Rea samples left up front for patient to get.  She has one more day of Jardiance remaining.  Patient has still not picked up Jardiance samples, however reminded patient to pick up again.  Vania Rea is not packagad with her other medications as pharmacy is unable to package outside medications due to liability reasons. . Patient did receive new glucometer, (Freestyle lite), however she still does not how to use it well.  Encouraged patient to  come by PCP office any Wednesday to program with PharmD. Marland Kitchen Patient states new Lexapro is working.  She feels better overall and is having less anxiety and depression.  Encouraged patient to continue taking this medication as prescribed.  Her overall disposition on the telephone has improved. . Will continue to follow.  Patient Self Care Activities:  . Attends all scheduled provider appointments . Calls pharmacy for medication refills   Please see past updates  related to this goal by clicking on the "Past Updates" button in the selected goal         The patient verbalized understanding of instructions provided today and declined a print copy of patient instruction materials.   The care management team will reach out to the patient again over the next 2-4 weeks.  Bridget Good, PharmD, BCPS Clinical Pharmacist, [facility] [place]: [phone]

## 2019-07-29 ENCOUNTER — Ambulatory Visit: Payer: Self-pay | Admitting: Pharmacist

## 2019-07-30 NOTE — Progress Notes (Signed)
  Chronic Care Management   Outreach Note  07/29/2019 Name: Bridget Good MRN: [id number] DOB: August 08, 1951  Referred by: [name], FNP Reason for referral : No chief complaint on file.   An unsuccessful telephone outreach was attempted today. The patient was referred to the case management team by for assistance with chronic care management and care coordination.   Follow Up Plan: The care management team will reach out to the patient again over the next 2 weeks  Regina Eck, PharmD, [place] Pharmacist, [place]: [phone]

## 2019-08-04 ENCOUNTER — Telehealth: Payer: Self-pay

## 2019-08-04 ENCOUNTER — Ambulatory Visit: Payer: Self-pay

## 2019-08-04 ENCOUNTER — Other Ambulatory Visit: Payer: Self-pay

## 2019-08-04 DIAGNOSIS — I10 Essential (primary) hypertension: Secondary | ICD-10-CM | POA: Diagnosis not present

## 2019-08-04 DIAGNOSIS — G9689 Other specified disorders of central nervous system: Secondary | ICD-10-CM

## 2019-08-04 DIAGNOSIS — F329 Major depressive disorder, single episode, unspecified: Secondary | ICD-10-CM | POA: Diagnosis not present

## 2019-08-04 DIAGNOSIS — E119 Type 2 diabetes mellitus without complications: Secondary | ICD-10-CM | POA: Diagnosis not present

## 2019-08-04 DIAGNOSIS — F419 Anxiety disorder, unspecified: Secondary | ICD-10-CM

## 2019-08-04 DIAGNOSIS — G3184 Mild cognitive impairment, so stated: Secondary | ICD-10-CM

## 2019-08-04 NOTE — Patient Instructions (Signed)
Visit Information  Goals Addressed      Patient Stated   . "I don't know how to use my glucometer" (pt-stated)       Current Barriers:  Marland Kitchen Knowledge Deficits related to how to correctly use freestyle glucometer  . Knowledge Deficits related to Self Health management of Diabetes  Nurse Case Manager Clinical Goal(s):  Marland Kitchen Over the next 30 days, patient will verbalize understanding of plan for Self monitoring CBGs at home.   Goal Met  . Over the next 90 days, patient will verbalize checking her CBG's at least once daily and will verbalize knowing how to self manage and or call her CCM team/PCP for hypo/hyperglycemic events. 05/12/19 re-established target goal date to 90 days due to COVID-19 treatment delays - Goal Not Met . 08/04/19 Over the next 30 days, patient will receive 1:1 instruction by the embedded Pharm D [name] and or PCP provider/office staff with assistance on use of her glucometer . 08/04/19 Over the next 60 days, patient will demonstrate the ability and increased comfort level in Self monitoring her CBG's at home as directed   CCM RN CM Interventions:  08/04/19: Call completed with patient   . Evaluation of current treatment plan related to diabetes disease management including Self monitoring CBG's and patient's adherence to plan as established by provider. . Advised patient to take her glucometer into the PCP office at next visit to allow 1:1 instruction on how to use the glucometer, patient verbalizes understanding  . Provided education to patient re: the importance of self monitoring her CBG's daily to help determine how well controlled her BG is running before meals and throughout the daytime; verbal education provided to patient regarding most recent elevated A1C of 7.5 obtained 2 months ago; discussed target A1C <7.0; discussed potential complications secondary to uncontrolled DM . Collaborated with PCP provider [name], FNP and embedded Pharm D [name]   regarding patient reports not checking her CBG's due to having difficulty understanding how to use her glucometer; informed patient was advised to bring her glucometer in at her next visit in order to have 1:1 instruction on how to properly use her glucometer . Discussed plans with patient for ongoing care management follow up and provided patient with direct contact information for care management team . Provided patient with printed educational materials related to Diabetes Management Zone Safety tool; s/s Hypo/Hyperglycemia . Reviewed scheduled/upcoming provider appointments including: next PCP provider OV with [name], [place] set for 08/19/19 @ 9AM  Patient Self Care Activities:  . Self administers medications with assistance from her family . Attends all scheduled provider appointments . Calls pharmacy for medication refills . Performs ADL's independently . Performs IADL's independently . Calls provider office for new concerns or questions  Please see past updates related to this goal by clicking on the "Past Updates" button in the selected goal       . "I have some anxiety and depression" "I have misplaced my Sertraline" (pt-stated)       Current Barriers:  Marland Kitchen Knowledge Deficits related to Self Health Management for Anxiety and Depression  . Cognitive Deficits, specifically related to recent change in memory . Financial Constraints  Nurse Case Manager Clinical Goal(s):  Marland Kitchen Over the next 30 days, patient will demonstrate improved adherence to prescribed treatment plan for symptoms of depression and anxiety as evidenced bypatient will report taking her antidepressant exactly as prescribed and without missed doses. 05/12/19-Goal Not Met - patient is not taking the  prescribed medication for her anxiety's  . 05/12/19: Over the next 30 days, patient will speak with PCP provider [name], FNP re: an alternate treatment for anxiety.  (GOAL MET) . 08/04/19 Over the next 30 days, patient will  speak with her PCP provider [name], FNP  and or embedded Pharm D [name] to explore other pharmacological options to treat anxiety  CCM RN CM Interventions:  08/04/19 call completed with patient   . Evaluation of current treatment plan related to treatment management for Anxiety and patient's adherence to plan as established by provider. . Provided education to patient re: the importance of taking the prescribed medication Lexapro everyday exactly as prescribed without missed doses for best effectiveness; patient admits she is not taking the Lexapro due to experiencing SE; patient states, "it makes me feel groggy"; patient states she continues to have anxiety everyday of her life and is interested in taking an antianxiety as needed but not necessarily everyday; patient is agreeable to discuss this further with embedded Pharm D [name] at next scheduled follow up call; discussed she is interested in learning more about the drug Wellbutrin  . Reviewed medications with patient and discussed patient is not taking Lexapro due to experiencing SE . Collaborated with PCP provider [name] FNP and embedded Pharm D [name]  regarding patients c/o ongoing anxiety and request to explore and consider other pharmacological treatments to help manage this condition due to patient states she is not taking the Lexapro due to "feeling groggy" and prefers to be prescribed a medication as needed and not everyday; advised patient is interested in learning more about Wellbutrin . Discussed plans with patient for ongoing care management follow up and provided patient with direct contact information for care management team  CM PharmD Interventions:  Completed call with patient on 06/25/19:   . Recommended sertraline to Lexapro (escitalopram) due to fewer reported adverse effects.  Patient did not want to take sertraline because she was worried about side effects.  Lexapro will offer patient the  ability to receive both anti-depressant effects and anti-anxiety effects.   . Patient has now been taking Lexapro x3 weeks and states she does have a "better overall feeling".  She appeared to be in a better mood and carried on a cheerful conversation.  Patient will continue to exhibit compliance while using compliance packaging.  Will continue to follow.  Patient Self Care Activities:  . Self administers medications with assistance from her family . Attends all scheduled provider appointments . Calls pharmacy for medication refills . Performs ADL's independently . Performs IADL's independently . Calls provider office for new concerns or questions  Please see past updates related to this goal by clicking on the "Past Updates" button in the selected goal       . "I have to use my magnifying glass to see my medicines" (pt-stated)       Current Barriers:  Marland Kitchen Knowledge Deficits related to impaired vision   Nurse Case Manager Clinical Goal(s):  Marland Kitchen Over the next 60 days, patient will attend all scheduled medical appointments: including her eye exam with [place] scheduled for 06/22/19.  Goal Not Met . 08/04/19 Over the next 60 days, patient will follow up with an Eye Specialist to evaluate impaired vision.   CCM RN CM Interventions:  08/04/19 completed call with patient  . Evaluation of current treatment plan related to impaired vision and patient's adherence to plan as established by provider. . Advised patient  to call her established eye Specialist, [place] to reschedule her eye exam; provided patient with the contact information and encouraged her to record this information as well as a reminder to schedule the appointment on her calendar . Provided education to patient re: the importance of adhering to an annual eye exam to evaluate for diabetic retinopathy; provided basic education on what this condition is and how to prevent it by controlling her DM  . Discussed  plans with patient for ongoing care management follow up and provided patient with direct contact information for care management team . Provided patient with printed educational materials related to Diabetic retinopathy  Patient Self Care Activities:  . Self administers medications as prescribed . Attends all scheduled provider appointments . Calls pharmacy for medication refills . Performs ADL's independently . Performs IADL's independently . Calls provider office for new concerns or questions  Please see past updates related to this goal by clicking on the "Past Updates" button in the selected goal       . COMPLETED: I would like to organize my medications in order to have better adherence. (pt-stated)       Current Barriers:  . Non Adherence to prescribed medication regimen-->patient would like pill box to better organize medication.  She continues to have memory problems.  HH RN is filling weekly pill box   Pharmacist Clinical Goal(s):  Marland Kitchen Over the next 90 days, patient will demonstrate Improved medication adherence as evidenced by use of weekly complianc packaging from [employer]. 05/12/19 re-established target goal date to 90 days due to treatment delays secondary to COVID-19  CCM RN CM Interventions: 08/04/19: Completed call with patient   . Discussed and reviewed patient's new medication pill package card system  . Confirmed patient is able to self administer her medications without difficulty - she reports the new system is easier to manage - her spouse is reminding her to take her medications when needed and will assist if needed . Assessed for questions or concerns related to her medication management - patient denies having questions, she denies having any further difficulties with taking her meds as prescribed - patient is so very appreciative of the assistance she has received from the CCM team and states, "this has really improved my life so  much"  CCM PharmD Interventions: 07/22/19: Completed call with patient   . Patient states that she is continuing to use and adapts to her new pill packaging system (provided by [employer]).  She is adjusting to it well.  She states she has been taking the medication in her pill packs as directed.  She states she would like the indications written on the pill packaging system so she would be able to tell what the medication is for.  Discussed with PharmD at [facility].  Her next monthly packaging has been delivered. . Discussed case with Barb Merino, CCM RN CM.  Appreciate collaboration. Vania Rea samples left up front for patient to get.  She has one more day of Jardiance remaining.  Patient has still not picked up Jardiance samples, however reminded patient to pick up again.  Vania Rea is not packagad with her other medications as pharmacy is unable to package outside medications due to liability reasons. . Patient did receive new glucometer, (Freestyle lite), however she still does not how to use it well.  Encouraged patient to come by PCP office any Wednesday to program with PharmD. Marland Kitchen Patient states new Lexapro is working.  She feels better overall and is having less anxiety and depression.  Encouraged patient to continue taking this medication as prescribed.  Her overall disposition on the telephone has improved. . Will continue to follow.  Patient Self Care Activities:  . Attends all scheduled provider appointments . Calls pharmacy for medication refills   Please see past updates related to this goal by clicking on the "Past Updates" button in the selected goal         The patient verbalized understanding of instructions provided today and declined a print copy of patient instruction materials.   Telephone follow up appointment with care management team member scheduled for: 09/01/19  Barb Merino, RN, BSN, CCM Care Management Coordinator [facility]  Direct Phone: [phone]

## 2019-08-04 NOTE — Chronic Care Management (AMB) (Addendum)
Chronic Care Management   Follow Up Note   08/04/2019 Name: Bridget Good MRN: [id number] DOB: 14-May-1951  Referred by: Bridget Brine, FNP Reason for referral : Chronic Care Management (CCM RNCM Telephone Follow up )   Bridget Good is a 68 y.o. year old female who is a primary care patient of Bridget Good, [place]. The CCM team was consulted for assistance with chronic disease management and care coordination needs.    Review of patient status, including review of consultants reports, relevant laboratory and other test results, and collaboration with appropriate care team members and the patient's provider was performed as part of comprehensive patient evaluation and provision of chronic care management services.    SDOH (Social Determinants of Health) screening performed today: None. See Care Plan for related entries.   I spoke with Bridget Good by telephone today for a CCM follow up.   Outpatient Encounter Medications as of 08/04/2019  Medication Sig  . acetaminophen (TYLENOL) 500 MG tablet Take 1 tablet (500 mg total) by mouth as needed for mild pain.  Marland Kitchen amLODipine-benazepril (LOTREL) 10-40 MG capsule Take 1 capsule by mouth daily.  Marland Kitchen aspirin EC 81 MG tablet Take 1 tablet (81 mg total) by mouth daily.  . blood glucose meter kit and supplies Dispense based on patient and insurance preference. Use up to four times daily as directed. (FOR ICD-10 E10.9, E11.9).  Marland Kitchen Blood Glucose Monitoring Suppl (FREESTYLE LITE) DEVI Check blood sugars twice daily  . ciclopirox (PENLAC) 8 % solution Apply topically at bedtime. Apply over nail and surrounding skin. Apply daily over previous coat. Remove weekly with polish remover. (Patient taking differently: Apply 1 application topically at bedtime. Apply over nail and surrounding skin. Apply daily over previous coat. Remove weekly with polish remover.)  . clopidogrel (PLAVIX) 75 MG tablet Take 1 tablet (75 mg total) by mouth daily.  . empagliflozin  (JARDIANCE) 10 MG TABS tablet Take 10 mg by mouth daily.  Marland Kitchen escitalopram (LEXAPRO) 5 MG tablet Take 1 tablet (5 mg total) by mouth at bedtime. (Patient not taking: Reported on 08/04/2019)  . fluticasone (FLONASE) 50 MCG/ACT nasal spray Place into both nostrils daily.  Bridget Good Jelly-Vit B12 (GINSENG ROYAL JELLY PLUS) 375-1-15 MG-MG-MCG CAPS Take 1 capsule by mouth daily.  Marland Kitchen glucose blood test strip Use as instructed  . glucose blood test strip Test blood sugars up to 4 times a day  . ketoconazole (NIZORAL) 2 % cream Apply to both feet and between toes once daily for 6 weeks  . Lancets (FREESTYLE) lancets Use as instructed  . metoprolol succinate (TOPROL-XL) 50 MG 24 hr tablet Take 1 tablet (50 mg total) by mouth daily. Take with or immediately following a meal.  . nitroGLYCERIN (NITROSTAT) 0.4 MG SL tablet Place 1 tablet (0.4 mg total) under the tongue every 5 (five) minutes as needed for chest pain.  . pantoprazole (PROTONIX) 40 MG tablet Take one tablet by mouth daily in AM  . potassium chloride SA (K-DUR) 20 MEQ tablet Take 1 tablet (20 mEq total) by mouth daily.  . rosuvastatin (CRESTOR) 20 MG tablet Take 1 tablet (20 mg total) by mouth daily.  . Vitamin D, Ergocalciferol, (DRISDOL) 1.25 MG (50000 UT) CAPS capsule Take 1 capsule (50,000 Units total) by mouth 2 (two) times a week.   No facility-administered encounter medications on file as of 08/04/2019.      Goals Addressed      Patient Stated   . "I don't know how to  use my glucometer" (pt-stated)       Current Barriers:  Marland Kitchen Knowledge Deficits related to how to correctly use freestyle glucometer  . Knowledge Deficits related to Self Health management of Diabetes  Nurse Case Manager Clinical Goal(s):  Marland Kitchen Over the next 30 days, patient will verbalize understanding of plan for Self monitoring CBGs at home.   Goal Met  . Over the next 90 days, patient will verbalize checking her CBG's at least once daily and will verbalize knowing  how to self manage and or call her CCM team/PCP for hypo/hyperglycemic events. 05/12/19 re-established target goal date to 90 days due to COVID-19 treatment delays - Goal Not Met . 08/04/19 Over the next 30 days, patient will receive 1:1 instruction by the embedded Pharm D [name] and or PCP provider/office staff with assistance on use of her glucometer . 08/04/19 Over the next 60 days, patient will demonstrate the ability and increased comfort level in Self monitoring her CBG's at home as directed   CCM RN CM Interventions:  08/04/19: Call completed with patient   . Evaluation of current treatment plan related to diabetes disease management including Self monitoring CBG's and patient's adherence to plan as established by provider. . Advised patient to take her glucometer into the PCP office at next visit to allow 1:1 instruction on how to use the glucometer, patient verbalizes understanding  . Provided education to patient re: the importance of self monitoring her CBG's daily to help determine how well controlled her BG is running before meals and throughout the daytime; verbal education provided to patient regarding most recent elevated A1C of 7.5 obtained 2 months ago; discussed target A1C <7.0; discussed potential complications secondary to uncontrolled DM . Collaborated with PCP provider Bridget Brine, FNP and embedded Pharm D [name]  regarding patient reports not checking her CBG's due to having difficulty understanding how to use her glucometer; informed patient was advised to bring her glucometer in at her next visit in order to have 1:1 instruction on how to properly use her glucometer . Discussed plans with patient for ongoing care management follow up and provided patient with direct contact information for care management team . Provided patient with printed educational materials related to Diabetes Management Zone Safety tool; s/s Hypo/Hyperglycemia . Reviewed scheduled/upcoming  provider appointments including: next PCP provider OV with Bridget Good, [place] set for 08/19/19 @ 9AM  Patient Self Care Activities:  . Self administers medications with assistance from her family . Attends all scheduled provider appointments . Calls pharmacy for medication refills . Performs ADL's independently . Performs IADL's independently . Calls provider office for new concerns or questions  Please see past updates related to this goal by clicking on the "Past Updates" button in the selected goal       . "I have some anxiety and depression" "I have misplaced my Sertraline" (pt-stated)       Current Barriers:  Marland Kitchen Knowledge Deficits related to Self Health Management for Anxiety and Depression  . Cognitive Deficits, specifically related to recent change in memory . Financial Constraints  Nurse Case Manager Clinical Goal(s):  Marland Kitchen Over the next 30 days, patient will demonstrate improved adherence to prescribed treatment plan for symptoms of depression and anxiety as evidenced bypatient will report taking her antidepressant exactly as prescribed and without missed doses. 05/12/19-Goal Not Met - patient is not taking the prescribed medication for her anxiety's  . 05/12/19: Over the next 30 days, patient will speak with PCP provider  Bridget Brine, FNP re: an alternate treatment for anxiety.  (GOAL MET) . 08/04/19 Over the next 30 days, patient will speak with her PCP provider Bridget Brine, FNP  and or embedded Pharm D [name] to explore other pharmacological options to treat anxiety  CCM RN CM Interventions:  08/04/19 call completed with patient   . Evaluation of current treatment plan related to treatment management for Anxiety and patient's adherence to plan as established by provider. . Provided education to patient re: the importance of taking the prescribed medication Lexapro everyday exactly as prescribed without missed doses for best effectiveness; patient admits she is not taking the  Lexapro due to experiencing SE; patient states, "it makes me feel groggy"; patient states she continues to have anxiety everyday of her life and is interested in taking an antianxiety as needed but not necessarily everyday; patient is agreeable to discuss this further with embedded Pharm D [name] at next scheduled follow up call; discussed she is interested in learning more about the drug Wellbutrin  . Reviewed medications with patient and discussed patient is not taking Lexapro due to experiencing SE . Collaborated with PCP provider Bridget Brine FNP and embedded Pharm D [name]  regarding patients c/o ongoing anxiety and request to explore and consider other pharmacological treatments to help manage this condition due to patient states she is not taking the Lexapro due to "feeling groggy" and prefers to be prescribed a medication as needed and not everyday; advised patient is interested in learning more about Wellbutrin . Discussed plans with patient for ongoing care management follow up and provided patient with direct contact information for care management team   CM PharmD Interventions:  Completed call with patient on 06/25/19:   . Recommended sertraline to Lexapro (escitalopram) due to fewer reported adverse effects.  Patient did not want to take sertraline because she was worried about side effects.  Lexapro will offer patient the ability to receive both anti-depressant effects and anti-anxiety effects.   . Patient has now been taking Lexapro x3 weeks and states she does have a "better overall feeling".  She appeared to be in a better mood and carried on a cheerful conversation.  Patient will continue to exhibit compliance while using compliance packaging.  Will continue to follow.  Patient Self Care Activities:  . Self administers medications with assistance from her family . Attends all scheduled provider appointments . Calls pharmacy for medication refills . Performs ADL's  independently . Performs IADL's independently . Calls provider office for new concerns or questions  Please see past updates related to this goal by clicking on the "Past Updates" button in the selected goal       . "I have to use my magnifying glass to see my medicines" (pt-stated)       Current Barriers:  Marland Kitchen Knowledge Deficits related to impaired vision   Nurse Case Manager Clinical Goal(s):  Marland Kitchen Over the next 60 days, patient will attend all scheduled medical appointments: including her eye exam with [place] scheduled for 06/22/19.  Goal Not Met . 08/04/19 Over the next 60 days, patient will follow up with an Eye Specialist to evaluate impaired vision.   CCM RN CM Interventions:  08/04/19 completed call with patient  . Evaluation of current treatment plan related to impaired vision and patient's adherence to plan as established by provider. . Advised patient to call her established eye Specialist, [place] to reschedule her eye exam; provided patient with the  contact information and encouraged her to record this information as well as a reminder to schedule the appointment on her calendar . Provided education to patient re: the importance of adhering to an annual eye exam to evaluate for diabetic retinopathy; provided basic education on what this condition is and how to prevent it by controlling her DM  . Discussed plans with patient for ongoing care management follow up and provided patient with direct contact information for care management team . Provided patient with printed educational materials related to Diabetic retinopathy  Patient Self Care Activities:  . Self administers medications as prescribed . Attends all scheduled provider appointments . Calls pharmacy for medication refills . Performs ADL's independently . Performs IADL's independently . Calls provider office for new concerns or questions  Please see past updates related to this goal by  clicking on the "Past Updates" button in the selected goal       . COMPLETED: I would like to organize my medications in order to have better adherence. (pt-stated)       Current Barriers:  . Non Adherence to prescribed medication regimen-->patient would like pill box to better organize medication.  She continues to have memory problems.  HH RN is filling weekly pill box   Pharmacist Clinical Goal(s):  Marland Kitchen Over the next 90 days, patient will demonstrate Improved medication adherence as evidenced by use of weekly complianc packaging from [place]. 05/12/19 re-established target goal date to 90 days due to treatment delays secondary to COVID-19  CCM RN CM Interventions: 08/04/19: Completed call with patient   . Discussed and reviewed patient's new medication pill package card system  . Confirmed patient is able to self administer her medications without difficulty - she reports the new system is easier to manage - her spouse is reminding her to take her medications when needed and will assist if needed . Assessed for questions or concerns related to her medication management - patient denies having questions, she denies having any further difficulties with taking her meds as prescribed - patient is so very appreciative of the assistance she has received from the CCM team and states, "this has really improved my life so much"  CCM PharmD Interventions: 07/22/19: Completed call with patient   . Patient states that she is continuing to use and adapts to her new pill packaging system (provided by [employer]).  She is adjusting to it well.  She states she has been taking the medication in her pill packs as directed.  She states she would like the indications written on the pill packaging system so she would be able to tell what the medication is for.  [facility] with PharmD at [facility].  Her next monthly packaging has been delivered. . Discussed case with Barb Merino, CCM RN CM.  Appreciate collaboration. Vania Rea samples left up front for patient to get.  She has one more day of Jardiance remaining.  Patient has still not picked up Jardiance samples, however reminded patient to pick up again.  Vania Rea is not packagad with her other medications as pharmacy is unable to package outside medications due to liability reasons. . Patient did receive new glucometer, (Freestyle lite), however she still does not how to use it well.  Encouraged patient to come by PCP office any Wednesday to program with PharmD. Marland Kitchen Patient states new Lexapro is working.  She feels better overall and is having less anxiety and depression.  Encouraged patient to continue taking  this medication as prescribed.  Her overall disposition on the telephone has improved. . Will continue to follow.  Patient Self Care Activities:  . Attends all scheduled provider appointments . Calls pharmacy for medication refills   Please see past updates related to this goal by clicking on the "Past Updates" button in the selected goal          Telephone follow up appointment with care management team member scheduled for: 09/01/19   Barb Merino, RN, BSN, CCM Care Management Coordinator [facility]  Direct Phone: [phone]

## 2019-08-05 DIAGNOSIS — E785 Hyperlipidemia, unspecified: Secondary | ICD-10-CM | POA: Diagnosis not present

## 2019-08-05 DIAGNOSIS — I208 Other forms of angina pectoris: Secondary | ICD-10-CM | POA: Diagnosis not present

## 2019-08-05 DIAGNOSIS — E119 Type 2 diabetes mellitus without complications: Secondary | ICD-10-CM | POA: Diagnosis not present

## 2019-08-05 DIAGNOSIS — I10 Essential (primary) hypertension: Secondary | ICD-10-CM | POA: Diagnosis not present

## 2019-08-10 ENCOUNTER — Other Ambulatory Visit: Payer: Self-pay

## 2019-08-10 ENCOUNTER — Encounter: Payer: Self-pay | Admitting: Podiatry

## 2019-08-10 ENCOUNTER — Ambulatory Visit (INDEPENDENT_AMBULATORY_CARE_PROVIDER_SITE_OTHER): Payer: PPO | Admitting: Podiatry

## 2019-08-10 DIAGNOSIS — Q828 Other specified congenital malformations of skin: Secondary | ICD-10-CM | POA: Diagnosis not present

## 2019-08-10 DIAGNOSIS — M79674 Pain in right toe(s): Secondary | ICD-10-CM

## 2019-08-10 DIAGNOSIS — E119 Type 2 diabetes mellitus without complications: Secondary | ICD-10-CM | POA: Diagnosis not present

## 2019-08-10 DIAGNOSIS — M79675 Pain in left toe(s): Secondary | ICD-10-CM

## 2019-08-10 DIAGNOSIS — B351 Tinea unguium: Secondary | ICD-10-CM | POA: Diagnosis not present

## 2019-08-10 DIAGNOSIS — B353 Tinea pedis: Secondary | ICD-10-CM

## 2019-08-10 DIAGNOSIS — Z9229 Personal history of other drug therapy: Secondary | ICD-10-CM

## 2019-08-10 MED ORDER — CLOTRIMAZOLE-BETAMETHASONE 1-0.05 % EX CREA: TOPICAL_CREAM | CUTANEOUS | 1 refills | Status: DC

## 2019-08-10 NOTE — Patient Instructions (Addendum)
Athlete's Foot  Athlete's foot (tinea pedis) is a fungal infection of the skin on your feet. It often occurs on the skin that is between or underneath the toes. It can also occur on the soles of your feet. The infection can spread from person to person (is contagious). It can also spread when a person's bare feet come in contact with the fungus on shower floors or on items such as shoes. What are the causes? This condition is caused by a fungus that grows in warm, moist places. You can get athlete's foot by sharing shoes, shower stalls, towels, and wet floors with someone who is infected. Not washing your feet or changing your socks often enough can also lead to athlete's foot. What increases the risk? This condition is more likely to develop in:  Men.  People who have a weak body defense system (immune system).  People who have diabetes.  People who use public showers, such as at a gym.  People who wear heavy-duty shoes, such as industrial or military shoes.  Seasons with warm, humid weather. What are the signs or symptoms? Symptoms of this condition include:  Itchy areas between your toes or on the soles of your feet.  White, flaky, or scaly areas between your toes or on the soles of your feet.  Very itchy small blisters between your toes or on the soles of your feet.  Small cuts in your skin. These cuts can become infected.  Thick or discolored toenails. How is this diagnosed? This condition may be diagnosed with a physical exam and a review of your medical history. Your health care provider may also take a skin or toenail sample to examine under a microscope. How is this treated? This condition is treated with antifungal medicines. These may be applied as powders, ointments, or creams. In severe cases, an oral antifungal medicine may be given. Follow these instructions at home: Medicines  Apply or take over-the-counter and prescription medicines only as told by your health  care provider.  Apply your antifungal medicine as told by your health care provider. Do not stop using the antifungal even if your condition improves. Foot care  Do not scratch your feet.  Keep your feet dry: ? Wear cotton or wool socks. Change your socks every day or if they become wet. ? Wear shoes that allow air to flow, such as sandals or canvas tennis shoes.  Wash and dry your feet, including the area between your toes. Also, wash and dry your feet: ? Every day or as told by your health care provider. ? After exercising. General instructions  Do not let others use towels, shoes, nail clippers, or other personal items that touch your feet.  Protect your feet by wearing sandals in wet areas, such as locker rooms and shared showers.  Keep all follow-up visits as told by your health care provider. This is important.  If you have diabetes, keep your blood sugar under control. Contact a health care provider if:  You have a fever.  You have swelling, soreness, warmth, or redness in your foot.  Your feet are not getting better with treatment.  Your symptoms get worse.  You have new symptoms. Summary  Athlete's foot (tinea pedis) is a fungal infection of the skin on your feet. It often occurs on skin that is between or underneath the toes.  This condition is caused by a fungus that grows in warm, moist places.  Symptoms include white, flaky, or scaly areas between   your toes or on the soles of your feet.  This condition is treated with antifungal medicines.  Keep your feet clean. Always dry them thoroughly. This information is not intended to replace advice given to you by your health care provider. Make sure you discuss any questions you have with your health care provider. Document Released: 11/23/2000 Document Revised: 11/21/2017 Document Reviewed: 09/16/2017 Elsevier Patient Education  2020 [employer]   Diabetes Mellitus and Foot Care Foot care is an important  part of your health, especially when you have diabetes. Diabetes may cause you to have problems because of poor blood flow (circulation) to your feet and legs, which can cause your skin to:  Become thinner and drier.  Break more easily.  Heal more slowly.  Peel and crack. You may also have nerve damage (neuropathy) in your legs and feet, causing decreased feeling in them. This means that you may not notice minor injuries to your feet that could lead to more serious problems. Noticing and addressing any potential problems early is the best way to prevent future foot problems. How to care for your feet Foot hygiene  Wash your feet daily with warm water and mild soap. Do not use hot water. Then, pat your feet and the areas between your toes until they are completely dry. Do not soak your feet as this can dry your skin.  Trim your toenails straight across. Do not dig under them or around the cuticle. File the edges of your nails with an emery board or nail file.  Apply a moisturizing lotion or petroleum jelly to the skin on your feet and to dry, brittle toenails. Use lotion that does not contain alcohol and is unscented. Do not apply lotion between your toes. Shoes and socks  Wear clean socks or stockings every day. Make sure they are not too tight. Do not wear knee-high stockings since they may decrease blood flow to your legs.  Wear shoes that fit properly and have enough cushioning. Always look in your shoes before you put them on to be sure there are no objects inside.  To break in new shoes, wear them for just a few hours a day. This prevents injuries on your feet. Wounds, scrapes, corns, and calluses  Check your feet daily for blisters, cuts, bruises, sores, and redness. If you cannot see the bottom of your feet, use a mirror or ask someone for help.  Do not cut corns or calluses or try to remove them with medicine.  If you find a minor scrape, cut, or break in the skin on your feet,  keep it and the skin around it clean and dry. You may clean these areas with mild soap and water. Do not clean the area with peroxide, alcohol, or iodine.  If you have a wound, scrape, corn, or callus on your foot, look at it several times a day to make sure it is healing and not infected. Check for: ? Redness, swelling, or pain. ? Fluid or blood. ? Warmth. ? Pus or a bad smell. General instructions  Do not cross your legs. This may decrease blood flow to your feet.  Do not use heating pads or hot water bottles on your feet. They may burn your skin. If you have lost feeling in your feet or legs, you may not know this is happening until it is too late.  Protect your feet from hot and cold by wearing shoes, such as at the beach or on hot pavement.    Schedule a complete foot exam at least once a year (annually) or more often if you have foot problems. If you have foot problems, report any cuts, sores, or bruises to your health care provider immediately. Contact a health care provider if:  You have a medical condition that increases your risk of infection and you have any cuts, sores, or bruises on your feet.  You have an injury that is not healing.  You have redness on your legs or feet.  You feel burning or tingling in your legs or feet.  You have pain or cramps in your legs and feet.  Your legs or feet are numb.  Your feet always feel cold.  You have pain around a toenail. Get help right away if:  You have a wound, scrape, corn, or callus on your foot and: ? You have pain, swelling, or redness that gets worse. ? You have fluid or blood coming from the wound, scrape, corn, or callus. ? Your wound, scrape, corn, or callus feels warm to the touch. ? You have pus or a bad smell coming from the wound, scrape, corn, or callus. ? You have a fever. ? You have a red line going up your leg. Summary  Check your feet every day for cuts, sores, red spots, swelling, and blisters.   Moisturize feet and legs daily.  Wear shoes that fit properly and have enough cushioning.  If you have foot problems, report any cuts, sores, or bruises to your health care provider immediately.  Schedule a complete foot exam at least once a year (annually) or more often if you have foot problems. This information is not intended to replace advice given to you by your health care provider. Make sure you discuss any questions you have with your health care provider. Document Released: 11/23/2000 Document Revised: 01/08/2018 Document Reviewed: 12/28/2016 Elsevier Patient Education  2020 Elsevier Inc.   Onychomycosis/Fungal Toenails  WHAT IS IT? An infection that lies within the keratin of your nail plate that is caused by a fungus.  WHY ME? Fungal infections affect all ages, sexes, races, and creeds.  There may be many factors that predispose you to a fungal infection such as age, coexisting medical conditions such as diabetes, or an autoimmune disease; stress, medications, fatigue, genetics, etc.  Bottom line: fungus thrives in a warm, moist environment and your shoes offer such a location.  IS IT CONTAGIOUS? Theoretically, yes.  You do not want to share shoes, nail clippers or files with someone who has fungal toenails.  Walking around barefoot in the same room or sleeping in the same bed is unlikely to transfer the organism.  It is important to realize, however, that fungus can spread easily from one nail to the next on the same foot.  HOW DO WE TREAT THIS?  There are several ways to treat this condition.  Treatment may depend on many factors such as age, medications, pregnancy, liver and kidney conditions, etc.  It is best to ask your doctor which options are available to you.  1. No treatment.   Unlike many other medical concerns, you can live with this condition.  However for many people this can be a painful condition and may lead to ingrown toenails or a bacterial infection.  It is  recommended that you keep the nails cut short to help reduce the amount of fungal nail. 2. Topical treatment.  These range from herbal remedies to prescription strength nail lacquers.  About 40-50% effective, topicals require   daily application for approximately 9 to 12 months or until an entirely new nail has grown out.  The most effective topicals are medical grade medications available through physicians offices. 3. Oral antifungal medications.  With an 80-90% cure rate, the most common oral medication requires 3 to 4 months of therapy and stays in your system for a year as the new nail grows out.  Oral antifungal medications do require blood work to make sure it is a safe drug for you.  A liver function panel will be performed prior to starting the medication and after the first month of treatment.  It is important to have the blood work performed to avoid any harmful side effects.  In general, this medication safe but blood work is required. 4. Laser Therapy.  This treatment is performed by applying a specialized laser to the affected nail plate.  This therapy is noninvasive, fast, and non-painful.  It is not covered by insurance and is therefore, out of pocket.  The results have been very good with a 80-95% cure rate.  [facility] is the only practice in the area to offer this therapy. 5. Permanent Nail Avulsion.  Removing the entire nail so that a new nail will not grow back. 

## 2019-08-11 ENCOUNTER — Telehealth: Payer: Self-pay

## 2019-08-17 NOTE — Progress Notes (Signed)
Subjective: Bridget Good is seen today for follow up painful, elongated, thickened toenails 1-5 b/l feet that she cannot cut. Pain interferes with daily activities. Aggravating factor includes wearing enclosed shoe gear and relieved with periodic debridement.  Objective: Neurovascular status unchanged: CFT immediate x 10 digits.  Palpable pedal pulses b/l.  Digital hair absent b/l.  No edema noted b/l.  Skin temperature gradient WNL b/l.  Dermatological Examination: Skin with normal turgor, texture and tone b/l.  Toenails 1-5 right foot discolored, thick, dystrophic with subungual debris and pain with palpation to nailbeds due to thickness of nails. Toenails 1-5 left foot nondystrophic and elongated.   No improvement in diffuse scaling peripherally and plantarly b/l feet.No interdigital macerations.  No blisters, no weeping. No signs of secondary bacterial infection noted.  Musculoskeletal: Muscle strength 5/5 to all LE muscle groups.  No gross bony deformities b/l.  No pain, crepitus or joint limitation noted with ROM.   Assessment: Painful onychomycosis toenails 1-5 right foot Nondystrophic toenails 1-5 left foot Tinea pedis unresponsive to Ketoconazole Cream NIDDM  Plan: 1. Toenails 1-5 right foot were debrided in length and girth without iatrogenic bleeding. Continue Penlac Nail Lacquer to affected nails as instructed.  2. Nondystrophic nails were trimmed 1-5 left foot. 3. Discontinue Ketoconazole Cream. Start Lotrisone Cream. Rx was sent for Lotrisone Cream to be applied to both feet and between toes bid for 4 weeks. 4. Patient to continue soft, supportive shoe gear daily.  5. Patient to report any pedal injuries to medical professional immediately. 6. Follow up 3 months.  7. Patient/POA to call should there be a concern in the interim.

## 2019-08-18 ENCOUNTER — Other Ambulatory Visit: Payer: Self-pay

## 2019-08-19 ENCOUNTER — Ambulatory Visit: Payer: Self-pay | Admitting: Pharmacist

## 2019-08-19 ENCOUNTER — Ambulatory Visit: Payer: PPO | Admitting: Nurse Practitioner

## 2019-08-19 NOTE — Progress Notes (Signed)
  Chronic Care Management   Outreach Note  08/19/2019 Name: Bridget Good MRN: [id number] DOB: September 06, 1951  Referred by: [name], FNP Reason for referral : Chronic Care Management   An unsuccessful telephone outreach was attempted today. The patient was referred to the case management team by for assistance with chronic care management and care coordination.   Follow Up Plan: The care management team will reach out to the patient again over the next 2 weeks.  Regina Eck, PharmD, BCPS Clinical Pharmacist, [facility] [place]: [phone]

## 2019-08-26 ENCOUNTER — Telehealth: Payer: Self-pay

## 2019-09-01 ENCOUNTER — Telehealth: Payer: Self-pay

## 2019-09-01 ENCOUNTER — Ambulatory Visit (INDEPENDENT_AMBULATORY_CARE_PROVIDER_SITE_OTHER): Payer: PPO

## 2019-09-01 ENCOUNTER — Other Ambulatory Visit: Payer: Self-pay

## 2019-09-01 DIAGNOSIS — E559 Vitamin D deficiency, unspecified: Secondary | ICD-10-CM

## 2019-09-01 DIAGNOSIS — E119 Type 2 diabetes mellitus without complications: Secondary | ICD-10-CM | POA: Diagnosis not present

## 2019-09-01 DIAGNOSIS — G3184 Mild cognitive impairment, so stated: Secondary | ICD-10-CM

## 2019-09-01 DIAGNOSIS — I10 Essential (primary) hypertension: Secondary | ICD-10-CM

## 2019-09-02 ENCOUNTER — Telehealth: Payer: Self-pay

## 2019-09-02 NOTE — Patient Instructions (Signed)
Visit Information  Goals Addressed      Patient Stated   . "I am taking my Vitamin D as directed" (pt-stated)       Current Barriers:  Marland Kitchen Knowledge Deficits related to potential complications secondary to Vitamin D deficiency   Nurse Case Manager Clinical Goal(s):  Marland Kitchen Over the next 30 days, patient will verbalize basic understanding of the potential complications that may occur secondary to Vitamin D deficiency and self health management plan as evidenced by patient will eat a Vitamin D rich diet and report 100% adherence to following her pharmacological recommendations . Over the next 90 days, patient will have a repeat Vitamin D level redrawn and her level will be WNL  CCM RN CM Interventions: 09/01/19 call completed with patient    . Evaluation of current treatment plan related to Vitamin D deficieny and patient's adherence to plan as established by provider. . Provided education to patient re: patient's Vitamin D level of 7.2 checked in June; discussed the target range of 60-100 to help avoid potential complications including; Increased risk of death from cardiovascular disease, Cognitive impairment in older adults, Asthma exacerbations, and Cancer; VE given related to dietary recommendations to help oral intake of Vitamin D . Reviewed medications with patient and discussed patient's current dosage of Vitamin D; discussed patient is taking 50,000 UT twice weekly on Mondays and Thursdays . Discussed plans with patient for ongoing care management follow up and provided patient with direct contact information for care management team  Patient Self Care Activities:  . Currently UNABLE TO independently perform self care  Initial goal documentation     . "I don't know how to use my glucometer" (pt-stated)       Current Barriers:  Marland Kitchen Knowledge Deficits related to how to correctly use freestyle glucometer  . Knowledge Deficits related to Self Health management of Diabetes  Nurse Case Manager  Clinical Goal(s):  Marland Kitchen Over the next 30 days, patient will verbalize understanding of plan for Self monitoring CBGs at home.   Goal Met  . Over the next 90 days, patient will verbalize checking her CBG's at least once daily and will verbalize knowing how to self manage and or call her CCM team/PCP for hypo/hyperglycemic events. 05/12/19 re-established target goal date to 90 days due to COVID-19 treatment delays - Goal Not Met . 08/04/19 Over the next 30 days, patient will receive 1:1 instruction by the embedded Pharm D [name] and or PCP provider/office staff with assistance on use of her glucometer . 08/04/19 Over the next 60 days, patient will demonstrate the ability and increased comfort level in Self monitoring her CBG's at home as directed   CCM RN CM Interventions:  09/01/19: Call completed with patient   . Evaluation of current treatment plan related to diabetes disease management including Self monitoring CBG's and patient's adherence to plan as established by provider. . Advised patient to contact the CCM if she is unable to have her family friend instruct her on use of her glucometer as discussed today . Provided education to patient re: the importance of self monitoring her CBG's daily to help determine how well controlled her BG is running before meals and throughout the daytime; verbal education provided to patient regarding most recent elevated A1C of 7.5 obtained 2 months ago; discussed target A1C <7.0; discussed potential complications secondary to uncontrolled DM . Collaborated with PCP provider [name], FNP and embedded Pharm D [name]  regarding patient is not checking her  CBG's due to stating she cannot use her glucometer; advised Mrs. Peabody has agreed to have a family friend demonstrate usage of her glucometer to herself and her husband; she will ask her husband to assist her with CBG's . Discussed plans with patient for ongoing care management follow up and provided  patient with direct contact information for care management team . Provided verbal education to patient with signs/symptoms to hypo/hyperglycemia; instructed patient on what to do if she becomes symptomatic; discussed when to call the PCP and or CCM team if her symptoms do not improve after following the recommendations provided  Patient Self Care Activities:  . Self administers medications with assistance from her family . Attends all scheduled provider appointments . Calls pharmacy for medication refills . Performs ADL's independently . Performs IADL's independently . Calls provider office for new concerns or questions  Please see past updates related to this goal by clicking on the "Past Updates" button in the selected goal      . COMPLETED: "I have some anxiety and depression" "I have misplaced my Sertraline" (pt-stated)       Current Barriers:  Marland Kitchen Knowledge Deficits related to Self Health Management for Anxiety and Depression  . Cognitive Deficits, specifically related to recent change in memory . Financial Constraints  Nurse Case Manager Clinical Goal(s):  Marland Kitchen Over the next 30 days, patient will demonstrate improved adherence to prescribed treatment plan for symptoms of depression and anxiety as evidenced bypatient will report taking her antidepressant exactly as prescribed and without missed doses. 05/12/19-Goal Not Met - patient is not taking the prescribed medication for her anxiety's  . 05/12/19: Over the next 30 days, patient will speak with PCP provider [name], FNP re: an alternate treatment for anxiety.  (GOAL MET) . 08/04/19 Over the next 30 days, patient will speak with her PCP provider [name], FNP  and or embedded Pharm D [name] to explore other pharmacological options to treat anxiety Goal Met  CCM RN CM Interventions:  09/01/19 call completed with patient   . Evaluation of current treatment plan related to treatment management for Anxiety and patient's  adherence to plan as established by provider. . Reinforcement provided re: the importance of taking the prescribed medication Lexapro everyday exactly as prescribed without missed doses for best effectiveness; patient admits she is not taking the Lexapro due to experiencing SE; patient states, "it makes me feel groggy"; patient states she will not take an anti-anxiety medication at this time . Discussed plans with patient for ongoing care management follow up and provided patient with direct contact information for care management team  Patient Self Care Activities:  . Self administers medications with assistance from her family . Attends all scheduled provider appointments . Calls pharmacy for medication refills . Performs ADL's independently . Performs IADL's independently . Calls provider office for new concerns or questions  Please see past updates related to this goal by clicking on the "Past Updates" button in the selected goal      . "I have to use my magnifying glass to see my medicines" (pt-stated)       Current Barriers:  Marland Kitchen Knowledge Deficits related to impaired vision   Nurse Case Manager Clinical Goal(s):  Marland Kitchen Over the next 60 days, patient will attend all scheduled medical appointments: including her eye exam with [place] scheduled for 06/22/19.  Goal Not Met . 08/04/19 Over the next 60 days, patient will follow up with an Eye Specialist to evaluate  impaired vision.   CCM RN CM Interventions:  09/01/19 completed call with patient  . Evaluation of current treatment plan related to impaired vision and patient's adherence to plan as established by provider. . Advised patient to call her established eye Specialist, [place] to reschedule her eye exam; provided patient with the contact information and encouraged her to record this information as well as a reminder to schedule the appointment on her calendar . Provided education to patient re: the importance  of adhering to an annual eye exam to evaluate for diabetic retinopathy and or other potential optic complications; provided verbal education related to potential eye/vision complications that may occur secondary to diabetes  . Discussed plans with patient for ongoing care management follow up and provided patient with direct contact information for care management team  Patient Self Care Activities:  . Self administers medications as prescribed . Attends all scheduled provider appointments . Calls pharmacy for medication refills . Performs ADL's independently . Performs IADL's independently . Calls provider office for new concerns or questions  Please see past updates related to this goal by clicking on the "Past Updates" button in the selected goal         The patient verbalized understanding of instructions provided today and declined a print copy of patient instruction materials.   Telephone follow up appointment with care management team member scheduled for: 10/02/19  Barb Merino, RN, BSN, CCM Care Management Coordinator [facility]  Direct Phone: [phone]

## 2019-09-02 NOTE — Chronic Care Management (AMB) (Signed)
Chronic Care Management   Follow Up Note   09/01/2019 Name: Bridget Good MRN: [id number] DOB: June 15, 1951  Referred by: [name], FNP Reason for referral : Chronic Care Management (CCM RNCM Telephone Outreach )   LANIESHA Good is a 68 y.o. year old female who is a primary care patient of [name], [place]. The CCM team was consulted for assistance with chronic disease management and care coordination needs.    Review of patient status, including review of consultants reports, relevant laboratory and other test results, and collaboration with appropriate care team members and the patient's provider was performed as part of comprehensive patient evaluation and provision of chronic care management services.    SDOH (Social Determinants of Health) screening performed today: None. See Care Plan for related entries.   Advanced Directives Status: N See Care Plan and Vyn[employer] application for related entries.  I spoke with Mrs. Sneeringer by telephone for a CCM RN follow up.   Outpatient Encounter Medications as of 09/01/2019  Medication Sig  . acetaminophen (TYLENOL) 500 MG tablet Take 1 tablet (500 mg total) by mouth as needed for mild pain.  Marland Kitchen amLODipine-benazepril (LOTREL) 10-40 MG capsule Take 1 capsule by mouth daily.  Marland Kitchen aspirin EC 81 MG tablet Take 1 tablet (81 mg total) by mouth daily.  . blood glucose meter kit and supplies Dispense based on patient and insurance preference. Use up to four times daily as directed. (FOR ICD-10 E10.9, E11.9).  Marland Kitchen Blood Glucose Monitoring Suppl (FREESTYLE LITE) DEVI Check blood sugars twice daily  . ciclopirox (PENLAC) 8 % solution Apply topically at bedtime. Apply over nail and surrounding skin. Apply daily over previous coat. Remove weekly with polish remover. (Patient taking differently: Apply 1 application topically at bedtime. Apply over nail and surrounding skin. Apply daily over previous coat. Remove weekly with polish remover.)  . clopidogrel  (PLAVIX) 75 MG tablet Take 1 tablet (75 mg total) by mouth daily.  . clotrimazole-betamethasone (LOTRISONE) cream Apply to both feet and between toes bid x 4 weeks.  . empagliflozin (JARDIANCE) 10 MG TABS tablet Take 10 mg by mouth daily.  Marland Kitchen escitalopram (LEXAPRO) 5 MG tablet Take 1 tablet (5 mg total) by mouth at bedtime. (Patient not taking: Reported on 08/04/2019)  . fluticasone (FLONASE) 50 MCG/ACT nasal spray Place into both nostrils daily.  [name] Jelly-Vit B12 (GINSENG ROYAL JELLY PLUS) 375-1-15 MG-MG-MCG CAPS Take 1 capsule by mouth daily.  Marland Kitchen glucose blood test strip Use as instructed  . glucose blood test strip Test blood sugars up to 4 times a day  . Lancets (FREESTYLE) lancets Use as instructed  . metoprolol succinate (TOPROL-XL) 50 MG 24 hr tablet Take 1 tablet (50 mg total) by mouth daily. Take with or immediately following a meal.  . nitroGLYCERIN (NITROSTAT) 0.4 MG SL tablet Place 1 tablet (0.4 mg total) under the tongue every 5 (five) minutes as needed for chest pain.  . pantoprazole (PROTONIX) 40 MG tablet Take one tablet by mouth daily in AM  . potassium chloride SA (K-DUR) 20 MEQ tablet Take 1 tablet (20 mEq total) by mouth daily.  . rosuvastatin (CRESTOR) 20 MG tablet Take 1 tablet (20 mg total) by mouth daily.  . Vitamin D, Ergocalciferol, (DRISDOL) 1.25 MG (50000 UT) CAPS capsule Take 1 capsule (50,000 Units total) by mouth 2 (two) times a week.   No facility-administered encounter medications on file as of 09/01/2019.     Goals Addressed      Patient  Stated   . "I am taking my Vitamin D as directed" (pt-stated)       Current Barriers:  Marland Kitchen Knowledge Deficits related to potential complications secondary to Vitamin D deficiency   Nurse Case Manager Clinical Goal(s):  Marland Kitchen Over the next 30 days, patient will verbalize basic understanding of the potential complications that may occur secondary to Vitamin D deficiency and self health management plan as evidenced by  patient will eat a Vitamin D rich diet and report 100% adherence to following her pharmacological recommendations . Over the next 90 days, patient will have a repeat Vitamin D level redrawn and her level will be WNL  CCM RN CM Interventions: 09/01/19 call completed with patient    . Evaluation of current treatment plan related to Vitamin D deficieny and patient's adherence to plan as established by provider. . Provided education to patient re: patient's Vitamin D level of 7.2 checked in June; discussed the target range of 60-100 to help avoid potential complications including; Increased risk of death from cardiovascular disease, Cognitive impairment in older adults, Asthma exacerbations, and Cancer; VE given related to dietary recommendations to help oral intake of Vitamin D . Reviewed medications with patient and discussed patient's current dosage of Vitamin D; discussed patient is taking 50,000 UT twice weekly on Mondays and Thursdays . Discussed plans with patient for ongoing care management follow up and provided patient with direct contact information for care management team  Patient Self Care Activities:  . Currently UNABLE TO independently perform self care  Initial goal documentation     . "I don't know how to use my glucometer" (pt-stated)       Current Barriers:  Marland Kitchen Knowledge Deficits related to how to correctly use freestyle glucometer  . Knowledge Deficits related to Self Health management of Diabetes  Nurse Case Manager Clinical Goal(s):  Marland Kitchen Over the next 30 days, patient will verbalize understanding of plan for Self monitoring CBGs at home.   Goal Met  . Over the next 90 days, patient will verbalize checking her CBG's at least once daily and will verbalize knowing how to self manage and or call her CCM team/PCP for hypo/hyperglycemic events. 05/12/19 re-established target goal date to 90 days due to COVID-19 treatment delays - Goal Not Met . 08/04/19 Over the next 30 days,  patient will receive 1:1 instruction by the embedded Pharm D [name] and or PCP provider/office staff with assistance on use of her glucometer . 08/04/19 Over the next 60 days, patient will demonstrate the ability and increased comfort level in Self monitoring her CBG's at home as directed   CCM RN CM Interventions:  09/01/19: Call completed with patient   . Evaluation of current treatment plan related to diabetes disease management including Self monitoring CBG's and patient's adherence to plan as established by provider. . Advised patient to contact the CCM if she is unable to have her family friend instruct her on use of her glucometer as discussed today . Provided education to patient re: the importance of self monitoring her CBG's daily to help determine how well controlled her BG is running before meals and throughout the daytime; verbal education provided to patient regarding most recent elevated A1C of 7.5 obtained 2 months ago; discussed target A1C <7.0; discussed potential complications secondary to uncontrolled DM . Collaborated with PCP provider [name], FNP and embedded Pharm D [name]  regarding patient is not checking her CBG's due to stating she cannot use her glucometer; advised  Mrs. Moncrief has agreed to have a family friend demonstrate usage of her glucometer to herself and her husband; she will ask her husband to assist her with CBG's . Discussed plans with patient for ongoing care management follow up and provided patient with direct contact information for care management team . Provided verbal education to patient with signs/symptoms to hypo/hyperglycemia; instructed patient on what to do if she becomes symptomatic; discussed when to call the PCP and or CCM team if her symptoms do not improve after following the recommendations provided  Patient Self Care Activities:  . Self administers medications with assistance from her family . Attends all scheduled provider  appointments . Calls pharmacy for medication refills . Performs ADL's independently . Performs IADL's independently . Calls provider office for new concerns or questions  Please see past updates related to this goal by clicking on the "Past Updates" button in the selected goal      . COMPLETED: "I have some anxiety and depression" "I have misplaced my Sertraline" (pt-stated)       Current Barriers:  Marland Kitchen Knowledge Deficits related to Self Health Management for Anxiety and Depression  . Cognitive Deficits, specifically related to recent change in memory . Financial Constraints  Nurse Case Manager Clinical Goal(s):  Marland Kitchen Over the next 30 days, patient will demonstrate improved adherence to prescribed treatment plan for symptoms of depression and anxiety as evidenced bypatient will report taking her antidepressant exactly as prescribed and without missed doses. 05/12/19-Goal Not Met - patient is not taking the prescribed medication for her anxiety's  . 05/12/19: Over the next 30 days, patient will speak with PCP provider [name], FNP re: an alternate treatment for anxiety.  (GOAL MET) . 08/04/19 Over the next 30 days, patient will speak with her PCP provider [name], FNP  and or embedded Pharm D [name] to explore other pharmacological options to treat anxiety Goal Met  CCM RN CM Interventions:  09/01/19 call completed with patient   . Evaluation of current treatment plan related to treatment management for Anxiety and patient's adherence to plan as established by provider. . Reinforcement provided re: the importance of taking the prescribed medication Lexapro everyday exactly as prescribed without missed doses for best effectiveness; patient admits she is not taking the Lexapro due to experiencing SE; patient states, "it makes me feel groggy"; patient states she will not take an anti-anxiety medication at this time . Discussed plans with patient for ongoing care management follow up  and provided patient with direct contact information for care management team  Patient Self Care Activities:  . Self administers medications with assistance from her family . Attends all scheduled provider appointments . Calls pharmacy for medication refills . Performs ADL's independently . Performs IADL's independently . Calls provider office for new concerns or questions  Please see past updates related to this goal by clicking on the "Past Updates" button in the selected goal     . "I have to use my magnifying glass to see my medicines" (pt-stated)       Current Barriers:  Marland Kitchen Knowledge Deficits related to impaired vision   Nurse Case Manager Clinical Goal(s):  Marland Kitchen Over the next 60 days, patient will attend all scheduled medical appointments: including her eye exam with [facility] scheduled for 06/22/19.  Goal Not Met . 08/04/19 Over the next 60 days, patient will follow up with an Eye Specialist to evaluate impaired vision.   CCM RN CM Interventions:  09/01/19 completed  call with patient  . Evaluation of current treatment plan related to impaired vision and patient's adherence to plan as established by provider. . Advised patient to call her established eye Specialist, [place] to reschedule her eye exam; provided patient with the contact information and encouraged her to record this information as well as a reminder to schedule the appointment on her calendar . Provided education to patient re: the importance of adhering to an annual eye exam to evaluate for diabetic retinopathy and or other potential optic complications; provided verbal education related to potential eye/vision complications that may occur secondary to diabetes  . Discussed plans with patient for ongoing care management follow up and provided patient with direct contact information for care management team  Patient Self Care Activities:  . Self administers medications as prescribed . Attends all  scheduled provider appointments . Calls pharmacy for medication refills . Performs ADL's independently . Performs IADL's independently . Calls provider office for new concerns or questions  Please see past updates related to this goal by clicking on the "Past Updates" button in the selected goal         Telephone follow up appointment with care management team member scheduled for: 10/02/19   Barb Merino, RN, BSN, CCM Care Management Coordinator [facility]  Direct Phone: [phone]

## 2019-09-08 ENCOUNTER — Ambulatory Visit: Payer: Self-pay | Admitting: Pharmacist

## 2019-09-08 ENCOUNTER — Other Ambulatory Visit: Payer: Self-pay | Admitting: Nurse Practitioner

## 2019-09-08 DIAGNOSIS — E119 Type 2 diabetes mellitus without complications: Secondary | ICD-10-CM

## 2019-09-08 DIAGNOSIS — G3184 Mild cognitive impairment, so stated: Secondary | ICD-10-CM

## 2019-09-08 DIAGNOSIS — E559 Vitamin D deficiency, unspecified: Secondary | ICD-10-CM

## 2019-09-08 DIAGNOSIS — I10 Essential (primary) hypertension: Secondary | ICD-10-CM

## 2019-09-08 NOTE — Progress Notes (Signed)
  Chronic Care Management   Outreach Note  09/08/2019 Name: Bridget Good MRN: [id number] DOB: 11-Aug-1951  Referred by: [name], FNP Reason for referral : Chronic Care Management   An unsuccessful telephone outreach was attempted today. The patient was referred to the case management team by for assistance with care management and care coordination. Voicemail box was full and could not accept messages  Follow Up Plan: The care management team will reach out to the patient again over the next 7-10 business days.   Regina Eck, PharmD, BCPS Clinical Pharmacist, [facility] [place]: [phone]

## 2019-09-11 ENCOUNTER — Telehealth: Payer: Self-pay

## 2019-09-25 ENCOUNTER — Telehealth: Payer: Self-pay

## 2019-09-30 ENCOUNTER — Telehealth: Payer: Self-pay | Admitting: Nurse Practitioner

## 2019-09-30 ENCOUNTER — Telehealth: Payer: Self-pay

## 2019-09-30 NOTE — Telephone Encounter (Signed)
I called the patient to schedule AWV with Nicke[place].  There was no answer, and the voicemail was full.

## 2019-10-02 ENCOUNTER — Telehealth: Payer: Self-pay

## 2019-10-02 ENCOUNTER — Ambulatory Visit (INDEPENDENT_AMBULATORY_CARE_PROVIDER_SITE_OTHER): Payer: PPO

## 2019-10-02 ENCOUNTER — Other Ambulatory Visit: Payer: Self-pay

## 2019-10-02 DIAGNOSIS — G9689 Other specified disorders of central nervous system: Secondary | ICD-10-CM

## 2019-10-02 DIAGNOSIS — E119 Type 2 diabetes mellitus without complications: Secondary | ICD-10-CM

## 2019-10-02 DIAGNOSIS — I10 Essential (primary) hypertension: Secondary | ICD-10-CM

## 2019-10-06 NOTE — Chronic Care Management (AMB) (Signed)
Chronic Care Management   Follow Up Note   10/02/2019 Name: Bridget Good MRN: [id number] DOB: Nov 22, 1951  Referred by: Bridget Brine, FNP Reason for referral : Chronic Care Management (CCM RNCM Telephone Follow up)   Bridget Good is a 68 y.o. year old female who is a primary care patient of Bridget Good, Bridget Good. The CCM team was consulted for assistance with chronic disease management and care coordination needs.    Review of patient status, including review of consultants reports, relevant laboratory and other test results, and collaboration with appropriate care team members and the patient's provider was performed as part of comprehensive patient evaluation and provision of chronic care management services.    SDOH (Social Determinants of Health) screening performed today: None. See Care Plan for related entries.   Advanced Directives Status: N See Care Plan and Vyn[employer] application for related entries.  I spoke with Bridget Good by telephone today for a CCM update.   Outpatient Encounter Medications as of 10/02/2019  Medication Sig  . acetaminophen (TYLENOL) 500 MG tablet Take 1 tablet (500 mg total) by mouth as needed for mild pain.  Marland Kitchen amLODipine-benazepril (LOTREL) 10-40 MG capsule Take 1 capsule by mouth daily.  Marland Kitchen aspirin EC 81 MG tablet Take 1 tablet (81 mg total) by mouth daily.  . blood glucose meter kit and supplies Dispense based on patient and insurance preference. Use up to four times daily as directed. (FOR ICD-10 E10.9, E11.9).  Marland Kitchen Blood Glucose Monitoring Suppl (FREESTYLE LITE) DEVI Check blood sugars twice daily  . ciclopirox (PENLAC) 8 % solution Apply topically at bedtime. Apply over nail and surrounding skin. Apply daily over previous coat. Remove weekly with polish remover. (Patient taking differently: Apply 1 application topically at bedtime. Apply over nail and surrounding skin. Apply daily over previous coat. Remove weekly with polish remover.)  . clopidogrel  (PLAVIX) 75 MG tablet Take 1 tablet (75 mg total) by mouth daily.  . clotrimazole-betamethasone (LOTRISONE) cream Apply to both feet and between toes bid x 4 weeks.  . empagliflozin (JARDIANCE) 10 MG TABS tablet Take 10 mg by mouth daily.  Marland Kitchen escitalopram (LEXAPRO) 5 MG tablet Take 1 tablet (5 mg total) by mouth at bedtime. (Patient not taking: Reported on 08/04/2019)  . fluticasone (FLONASE) 50 MCG/ACT nasal spray Place into both nostrils daily.  Bridget Good (GINSENG ROYAL JELLY PLUS) 375-1-15 MG-MG-MCG CAPS Take 1 capsule by mouth daily.  Marland Kitchen glucose blood test strip Use as instructed  . glucose blood test strip Test blood sugars up to 4 times a day  . Lancets (FREESTYLE) lancets Use as instructed  . metoprolol succinate (TOPROL-XL) 50 MG 24 hr tablet Take 1 tablet (50 mg total) by mouth daily. Take with or immediately following a meal.  . nitroGLYCERIN (NITROSTAT) 0.4 MG SL tablet Place 1 tablet (0.4 mg total) under the tongue every 5 (five) minutes as needed for chest pain.  . pantoprazole (PROTONIX) 40 MG tablet Take one tablet by mouth daily in AM  . potassium chloride SA (K-DUR) 20 MEQ tablet Take 1 tablet (20 mEq total) by mouth daily.  . rosuvastatin (CRESTOR) 20 MG tablet Take 1 tablet (20 mg total) by mouth daily.  . Vitamin D, Ergocalciferol, (DRISDOL) 1.25 MG (50000 UT) CAPS capsule TAKE 1 CAPSULE TWICE A WEEK ( MONDAY & THURSDAY)   No facility-administered encounter medications on file as of 10/02/2019.      Goals Addressed      Patient Stated   . "  I am having left arm pain" (pt-stated)       Current Barriers:  Marland Kitchen Knowledge Deficits related to diagnosis and treatment for left arm pain  Nurse Case Manager Clinical Goal(s):  Marland Kitchen Over the next 30 days, patient will work with PCP provider Bridget Brine, FNP to address needs related to new onset of left arm pain  CCM RN CM Interventions:  10/02/19 call completed with patient   . Evaluation of current treatment plan  related to acute onset of left arm pain and patient's adherence to plan as established by provider . Assessed for potential causes of pain, patient denies injury, reports pain is relieved with Tylenol but returns after the medication wears off and often wakes her up during sleep, patient reports pain started about 1 week ago . Advised patient to consider following up with PCP provider Bridget Brine, FNP for further evaluation of new onset of left arm pain; added patient to PCP schedule for 10/07/19 _0 :15 AM  . Provided education to patient re: signs and symptoms suggestive of an MI; discussed symptoms for women can be much different than for men; instructed patient to call 911 for Pain or discomfort in one or both arms, the back, neck, jaw or stomach; uncomfortable pressure, squeezing, fullness or pain in the center of the chest; shortness of breath with or without chest discomfort; breaking out in a cold sweat, nausea or lightheadedness . Collaborated with Bridget Brine, FNP via in basket message regarding patients c/o new onset of left arm pain . Discussed plans with patient for ongoing care management follow up and provided patient with direct contact information for care management team  Patient Self Care Activities:  . Unable to independently perform Self Care  Initial goal documentation      . "I am taking my Vitamin D as directed" (pt-stated)       Current Barriers:  Marland Kitchen Knowledge Deficits related to potential complications secondary to Vitamin D deficiency   Nurse Case Manager Clinical Goal(s):  Marland Kitchen Over the next 30 days, patient will verbalize basic understanding of the potential complications that may occur secondary to Vitamin D deficiency and self health management plan as evidenced by patient will eat a Vitamin D rich diet and report 100% adherence to following her pharmacological recommendations . Over the next 90 days, patient will have a repeat Vitamin D level redrawn and her level will  be WNL  CCM RN CM Interventions: 10/02/19 call completed with patient    . Evaluation of current treatment plan related to Vitamin D deficieny and patient's adherence to plan as established by provider . Reviewed and discussed patient's last Vitamin D level of 7.5 obtained on 05/14/19 . Assessed for adherence to patient taking her Vitamin D as prescribed, 50,000 UT twice weekly on Mondays and Thursdays - patient reports adherence . Sent an in basket message to PCP provider Bridget Brine, FNP requesting a Vitamin D recheck with next OV  . Discussed plans with patient for ongoing care management follow up and provided patient with direct contact information for care management team  Patient Self Care Activities:  . Currently UNABLE TO independently perform self care  Please see past updates related to this goal by clicking on the "Past Updates" button in the selected goal      . COMPLETED: "I don't know how to use my glucometer" (pt-stated)       Current Barriers:  Marland Kitchen Knowledge Deficits related to how to correctly use freestyle glucometer  .  Knowledge Deficits related to Self Health management of Diabetes  Nurse Case Manager Clinical Goal(s):  Marland Kitchen Over the next 30 days, patient will verbalize understanding of plan for Self monitoring CBGs at home.   Goal Met  . Over the next 90 days, patient will verbalize checking her CBG's at least once daily and will verbalize knowing how to self manage and or call her CCM team/PCP for hypo/hyperglycemic events. 05/12/19 re-established target goal date to 90 days due to COVID-19 treatment delays - Goal Not Met . 08/04/19 Over the next 30 days, patient will receive 1:1 instruction by the embedded Pharm D Bridget Good and or PCP provider/office staff with assistance on use of her glucometer Goal Not Met  . 08/04/19 Over the next 60 days, patient will demonstrate the ability and increased comfort level in Self monitoring her CBG's at home as directed Goal Not Met    CCM RN CM Interventions:  10/02/19 call completed with patient  See other goal related to DM  Patient Self Care Activities:  . Self administers medications with assistance from her family . Attends all scheduled provider appointments . Calls pharmacy for medication refills . Performs ADL's independently . Performs IADL's independently . Calls provider office for new concerns or questions  Please see past updates related to this goal by clicking on the "Past Updates" button in the selected goal     . "I have to use my magnifying glass to see my medicines" (pt-stated)       Current Barriers:  Marland Kitchen Knowledge Deficits related to impaired vision   Nurse Case Manager Clinical Goal(s):  Marland Kitchen Over the next 60 days, patient will attend all scheduled medical appointments: including her eye exam with Bridget Good scheduled for 06/22/19.  Goal Not Met . 08/04/19 Over the next 60 days, patient will follow up with an Eye Specialist to evaluate impaired vision.   CCM RN CM Interventions:  10/02/19 completed call with patient  . Evaluation of current treatment plan related to impaired vision and patient's adherence to plan as established by provider. . Advised patient to call her established eye Specialist, Bridget Good to reschedule her eye exam; provided patient with the contact information and encouraged her to record this information as well as a reminder to schedule the appointment on her calendar . Provided education to patient re: the importance of adhering to an annual eye exam to evaluate for diabetic retinopathy and or other potential optic complications; provided verbal education related to potential eye/vision complications that may occur secondary to diabetes  . Discussed plans with patient for ongoing care management follow up and provided patient with direct contact information for care management team  Patient Self Care Activities:  . Self administers medications as  prescribed . Attends all scheduled provider appointments . Calls pharmacy for medication refills . Performs ADL's independently . Performs IADL's independently . Calls provider office for new concerns or questions  Please see past updates related to this goal by clicking on the "Past Updates" button in the selected goal       . I would like to continue to control my BGs and keep my A1c<7% (pt-stated)       Current Barriers:   Patient's current BG meter is malfunctioning-->requested new meter (completed)  A1C 7.5 (05/14/19)   Pharmacist Clinical Goal(s):   Over the next 14 days, patient will obtain Good BG meter through insurance: FreeStyle, Precision or One Touch meters are preferred through [facility] Completed  Over  the next 30 days, patient will obtain new glucometer to be able to check & maintain BG with help from husband & Home Health RN. Goal Not Met   06/15/19: Over the next 30 days, patient will have received a new glucometer from [place] and will be able to comfortably self monitor her CBG's GOAL NOT MET  06/15/19: Over the next 60 days, patient will have a running log of her BG readings, measuring and recording her readings at least once daily. GOAL NOT MET  New - 10/06/19  Over the next 30 days, patient will work with embedded Pharm D Bridget Good on how to use her glucometer   CCM PharmD Interventions: 06/25/19 call completed with patient  Discussed plans with patient for ongoing care management follow up and provided patient with direct contact information for care management team  Collaboration with provider re: medication management-->requested RX for test strips, lancets.  Pharmacist needed new instructions on RX for billing purposes.  Meter and supplies will be delivered to patient tomorrow.  Patient and pharmacist verbalized understanding  Patient has received new glucometer, however she has still not been able to successfully check blood sugar.  She states she  has been trying to use it, but does not feel comfortable yet.  Patient has a Building surveyor.  Encouraged patient to come to clinic on Monday or Wednesday of next week to set up glucometer.  CCM RN CM Interventions:  10/02/19 call completed with patient   . Evaluation of current treatment plan related to Diabetes and patient's adherence to plan as established by provider. . Provided education to patient re: A1C is up to 7.5 from 7.0 obtained on 05/14/19; discussed the target goal for A1C <7.0; reinforced importance of adhering to prescribed DM treatment plan including taking all diabetic medications exactly as prescribed, monitor FBS and follow established protocol for hypo/hyperglycemic episodes, implementing exercise regularly into daily routine, at least 150 minutes weekly, adhere to strictly following a diabetic friendly diet with Meal planning using the plate method; reinforced the importance of lowering A1C in order to help prevent potential complications that may occur as a result of having uncontrolled DM . Reviewed medications with patient and discussed that patient continues to use her pill package system and reports she is taking all medications exactly as prescribed w/o missed doses  . Collaborated with embedded Pharm D Bridget Good  regarding patient is not checking her CBG's due to still not feeling comfortable using her glucometer; advised patient agreed to bring her glucometer into the office during her OV on 10/07/19 in order for Bridget Good to instruct patient and her spouse on how to use the glucometer . Discussed plans with patient for ongoing care management follow up and provided patient with direct contact information for care management team . Advised patient, providing education and rationale, to check cbg daily before meals and record, calling the CCM team and or PCP provider for findings outside established parameters.  <80 and or >250  Please see past updates related to  this goal by clicking on the "Past Updates" button in the selected goal         Telephone follow up appointment with care management team member scheduled for: 10/22/19   Bridget Merino, RN, BSN, CCM Care Management Coordinator [facility]  Direct Phone: [phone]

## 2019-10-06 NOTE — Patient Instructions (Signed)
Visit Information  Goals Addressed      Patient Stated   . "I am having left arm pain" (pt-stated)       Current Barriers:  Marland Kitchen Knowledge Deficits related to diagnosis and treatment for left arm pain  Nurse Case Manager Clinical Goal(s):  Marland Kitchen Over the next 30 days, patient will work with PCP provider [name], FNP to address needs related to new onset of left arm pain  CCM RN CM Interventions:  10/02/19 call completed with patient   . Evaluation of current treatment plan related to acute onset of left arm pain and patient's adherence to plan as established by provider . Assessed for potential causes of pain, patient denies injury, reports pain is relieved with Tylenol but returns after the medication wears off and often wakes her up during sleep, patient reports pain started about 1 week ago . Advised patient to consider following up with PCP provider [name], FNP for further evaluation of new onset of left arm pain; added patient to PCP schedule for 10/07/19 _0 :15 AM  . Provided education to patient re: signs and symptoms suggestive of an MI; discussed symptoms for women can be much different than for men; instructed patient to call 911 for Pain or discomfort in one or both arms, the back, neck, jaw or stomach; uncomfortable pressure, squeezing, fullness or pain in the center of the chest; shortness of breath with or without chest discomfort; breaking out in a cold sweat, nausea or lightheadedness . Collaborated with [name], FNP via in basket message regarding patients c/o new onset of left arm pain . Discussed plans with patient for ongoing care management follow up and provided patient with direct contact information for care management team  Patient Self Care Activities:  . Unable to independently perform Self Care  Initial goal documentation      . "I am taking my Vitamin D as directed" (pt-stated)       Current Barriers:  Marland Kitchen Knowledge Deficits related to potential  complications secondary to Vitamin D deficiency   Nurse Case Manager Clinical Goal(s):  Marland Kitchen Over the next 30 days, patient will verbalize basic understanding of the potential complications that may occur secondary to Vitamin D deficiency and self health management plan as evidenced by patient will eat a Vitamin D rich diet and report 100% adherence to following her pharmacological recommendations . Over the next 90 days, patient will have a repeat Vitamin D level redrawn and her level will be WNL  CCM RN CM Interventions: 10/02/19 call completed with patient    . Evaluation of current treatment plan related to Vitamin D deficieny and patient's adherence to plan as established by provider . Reviewed and discussed patient's last Vitamin D level of 7.5 obtained on 05/14/19 . Assessed for adherence to patient taking her Vitamin D as prescribed, 50,000 UT twice weekly on Mondays and Thursdays - patient reports adherence . Sent an in basket message to PCP provider [name], FNP requesting a Vitamin D recheck with next OV  . Discussed plans with patient for ongoing care management follow up and provided patient with direct contact information for care management team  Patient Self Care Activities:  . Currently UNABLE TO independently perform self care  Please see past updates related to this goal by clicking on the "Past Updates" button in the selected goal      . COMPLETED: "I don't know how to use my glucometer" (pt-stated)       Current Barriers:  .  Knowledge Deficits related to how to correctly use freestyle glucometer  . Knowledge Deficits related to Self Health management of Diabetes  Nurse Case Manager Clinical Goal(s):  Marland Kitchen Over the next 30 days, patient will verbalize understanding of plan for Self monitoring CBGs at home.   Goal Met  . Over the next 90 days, patient will verbalize checking her CBG's at least once daily and will verbalize knowing how to self manage and or call her CCM  team/PCP for hypo/hyperglycemic events. 05/12/19 re-established target goal date to 90 days due to COVID-19 treatment delays - Goal Not Met . 08/04/19 Over the next 30 days, patient will receive 1:1 instruction by the embedded Pharm D [name] and or PCP provider/office staff with assistance on use of her glucometer Goal Not Met  . 08/04/19 Over the next 60 days, patient will demonstrate the ability and increased comfort level in Self monitoring her CBG's at home as directed Goal Not Met   CCM RN CM Interventions:  10/02/19 call completed with patient  See other goal related to DM  Patient Self Care Activities:  . Self administers medications with assistance from her family . Attends all scheduled provider appointments . Calls pharmacy for medication refills . Performs ADL's independently . Performs IADL's independently . Calls provider office for new concerns or questions  Please see past updates related to this goal by clicking on the "Past Updates" button in the selected goal       . "I have to use my magnifying glass to see my medicines" (pt-stated)       Current Barriers:  Marland Kitchen Knowledge Deficits related to impaired vision   Nurse Case Manager Clinical Goal(s):  Marland Kitchen Over the next 60 days, patient will attend all scheduled medical appointments: including her eye exam with [facility] scheduled for 06/22/19.  Goal Not Met . 08/04/19 Over the next 60 days, patient will follow up with an Eye Specialist to evaluate impaired vision.   CCM RN CM Interventions:  10/02/19 completed call with patient  . Evaluation of current treatment plan related to impaired vision and patient's adherence to plan as established by provider. . Advised patient to call her established eye Specialist, [place] to reschedule her eye exam; provided patient with the contact information and encouraged her to record this information as well as a reminder to schedule the appointment on her  calendar . Provided education to patient re: the importance of adhering to an annual eye exam to evaluate for diabetic retinopathy and or other potential optic complications; provided verbal education related to potential eye/vision complications that may occur secondary to diabetes  . Discussed plans with patient for ongoing care management follow up and provided patient with direct contact information for care management team  Patient Self Care Activities:  . Self administers medications as prescribed . Attends all scheduled provider appointments . Calls pharmacy for medication refills . Performs ADL's independently . Performs IADL's independently . Calls provider office for new concerns or questions  Please see past updates related to this goal by clicking on the "Past Updates" button in the selected goal       . I would like to continue to control my BGs and keep my A1c<7% (pt-stated)       Current Barriers:   Patient's current BG meter is malfunctioning-->requested new meter (completed)  A1C 7.5 (05/14/19)   Pharmacist Clinical Goal(s):   Over the next 14 days, patient will obtain free BG meter through  insurance: FreeStyle, Precision or One Touch meters are preferred through [facility] Completed  Over the next 30 days, patient will obtain new glucometer to be able to check & maintain BG with help from husband & [place]. Goal Not Met   06/15/19: Over the next 30 days, patient will have received a new glucometer from [employer] and will be able to comfortably self monitor her CBG's GOAL NOT MET  06/15/19: Over the next 60 days, patient will have a running log of her BG readings, measuring and recording her readings at least once daily. GOAL NOT MET  New - 10/06/19  Over the next 30 days, patient will work with embedded Pharm D [name] on how to use her glucometer   CCM PharmD Interventions: 06/25/19 call completed with patient  Discussed plans with patient for  ongoing care management follow up and provided patient with direct contact information for care management team  Collaboration with provider re: medication management-->requested RX for test strips, lancets.  Pharmacist needed new instructions on RX for billing purposes.  Meter and supplies will be delivered to patient tomorrow.  Patient and pharmacist verbalized understanding  Patient has received new glucometer, however she has still not been able to successfully check blood sugar.  She states she has been trying to use it, but does not feel comfortable yet.  Patient has a Building surveyor.  Encouraged patient to come to clinic on Monday or Wednesday of next week to set up glucometer.   CCM RN CM Interventions:  10/02/19 call completed with patient   . Evaluation of current treatment plan related to Diabetes and patient's adherence to plan as established by provider. . Provided education to patient re: A1C is up to 7.5 from 7.0 obtained on 05/14/19; discussed the target goal for A1C <7.0; reinforced importance of adhering to prescribed DM treatment plan including taking all diabetic medications exactly as prescribed, monitor FBS and follow established protocol for hypo/hyperglycemic episodes, implementing exercise regularly into daily routine, at least 150 minutes weekly, adhere to strictly following a diabetic friendly diet with Meal planning using the plate method; reinforced the importance of lowering A1C in order to help prevent potential complications that may occur as a result of having uncontrolled DM . Reviewed medications with patient and discussed that patient continues to use her pill package system and reports she is taking all medications exactly as prescribed w/o missed doses  . Collaborated with embedded Pharm D [name]  regarding patient is not checking her CBG's due to still not feeling comfortable using her glucometer; advised patient agreed to bring her glucometer into  the office during her OV on 10/07/19 in order for [name] to instruct patient and her spouse on how to use the glucometer . Discussed plans with patient for ongoing care management follow up and provided patient with direct contact information for care management team . Advised patient, providing education and rationale, to check cbg daily before meals and record, calling the CCM team and or PCP provider for findings outside established parameters.  <80 and or >250   Please see past updates related to this goal by clicking on the "Past Updates" button in the selected goal         The patient verbalized understanding of instructions provided today and declined a print copy of patient instruction materials.   Telephone follow up appointment with care management team member scheduled for: 10/22/19  Barb Merino, RN, BSN, CCM Care Management Coordinator [place]  Management/[facility]  Direct Phone: [phone]

## 2019-10-07 ENCOUNTER — Ambulatory Visit (INDEPENDENT_AMBULATORY_CARE_PROVIDER_SITE_OTHER): Payer: PPO | Admitting: Nurse Practitioner

## 2019-10-07 ENCOUNTER — Telehealth: Payer: Self-pay | Admitting: Pharmacist

## 2019-10-07 ENCOUNTER — Ambulatory Visit: Payer: Self-pay | Admitting: Pharmacist

## 2019-10-07 ENCOUNTER — Ambulatory Visit (INDEPENDENT_AMBULATORY_CARE_PROVIDER_SITE_OTHER): Payer: PPO

## 2019-10-07 ENCOUNTER — Encounter: Payer: Self-pay | Admitting: Nurse Practitioner

## 2019-10-07 ENCOUNTER — Other Ambulatory Visit: Payer: Self-pay

## 2019-10-07 ENCOUNTER — Ambulatory Visit: Payer: Self-pay | Admitting: Nurse Practitioner

## 2019-10-07 ENCOUNTER — Ambulatory Visit: Payer: PPO

## 2019-10-07 VITALS — BP 120/78 | HR 62 | Temp 98.9°F | Ht 63.6 in | Wt 190.0 lb

## 2019-10-07 VITALS — BP 120/78 | HR 62 | Temp 98.9°F | Ht 63.6 in | Wt 190.6 lb

## 2019-10-07 DIAGNOSIS — F329 Major depressive disorder, single episode, unspecified: Secondary | ICD-10-CM | POA: Diagnosis not present

## 2019-10-07 DIAGNOSIS — E119 Type 2 diabetes mellitus without complications: Secondary | ICD-10-CM

## 2019-10-07 DIAGNOSIS — M25512 Pain in left shoulder: Secondary | ICD-10-CM | POA: Diagnosis not present

## 2019-10-07 DIAGNOSIS — E2839 Other primary ovarian failure: Secondary | ICD-10-CM

## 2019-10-07 DIAGNOSIS — Z23 Encounter for immunization: Secondary | ICD-10-CM

## 2019-10-07 DIAGNOSIS — Z0000 Encounter for general adult medical examination without abnormal findings: Secondary | ICD-10-CM | POA: Diagnosis not present

## 2019-10-07 DIAGNOSIS — I10 Essential (primary) hypertension: Secondary | ICD-10-CM | POA: Diagnosis not present

## 2019-10-07 DIAGNOSIS — G3184 Mild cognitive impairment, so stated: Secondary | ICD-10-CM

## 2019-10-07 DIAGNOSIS — F32A Depression, unspecified: Secondary | ICD-10-CM

## 2019-10-07 MED ORDER — KETOROLAC TROMETHAMINE 30 MG/ML IJ SOLN
30.0000 mg | Freq: Once | INTRAMUSCULAR | Status: AC
  Administered 2019-10-07: 30 mg via INTRAMUSCULAR

## 2019-10-07 MED ORDER — DICLOFENAC SODIUM 1 % TD GEL: 2.0000 g | Freq: Four times a day (QID) | TRANSDERMAL | 2 refills | Status: DC

## 2019-10-07 MED ORDER — PREVNAR 13 IM SUSP: 0.5000 mL | INTRAMUSCULAR | 0 refills | Status: AC

## 2019-10-07 MED ORDER — BOOSTRIX 5-2.5-18.5 LF-MCG/0.5 IM SUSP: 0.5000 mL | Freq: Once | INTRAMUSCULAR | 0 refills | Status: AC

## 2019-10-07 NOTE — Patient Instructions (Signed)
Bridget Good , Thank you for taking time to come for your Medicare Wellness Visit. I appreciate your ongoing commitment to your health goals. Please review the following plan we discussed and let me know if I can assist you in the future.   Screening recommendations/referrals: Colonoscopy: 03/2015 Mammogram: 01/2019 Bone Density: due Recommended yearly ophthalmology/optometry visit for glaucoma screening and checkup Recommended yearly dental visit for hygiene and checkup  Vaccinations: Influenza vaccine: 08/2019 Pneumococcal vaccine: sent to pharmacy Tdap vaccine: sent to pharmacy Shingles vaccine: discussed    Advanced directives: Advance directive discussed with you today. I have provided a copy for you to complete at home and have notarized. Once this is complete please bring a copy in to our office so we can scan it into your chart.  Conditions/risks identified: obesity  Next appointment: 10/07/2019   Preventive Care 22 Years and Older, Female Preventive care refers to lifestyle choices and visits with your health care provider that can promote health and wellness. What does preventive care include?  A yearly physical exam. This is also called an annual well check.  Dental exams once or twice a year.  Routine eye exams. Ask your health care provider how often you should have your eyes checked.  Personal lifestyle choices, including:  Daily care of your teeth and gums.  Regular physical activity.  Eating a healthy diet.  Avoiding tobacco and drug use.  Limiting alcohol use.  Practicing safe sex.  Taking low-dose aspirin every day.  Taking vitamin and mineral supplements as recommended by your health care provider. What happens during an annual well check? The services and screenings done by your health care provider during your annual well check will depend on your age, overall health, lifestyle risk factors, and family history of disease. Counseling  Your health  care provider may ask you questions about your:  Alcohol use.  Tobacco use.  Drug use.  Emotional well-being.  Home and relationship well-being.  Sexual activity.  Eating habits.  History of falls.  Memory and ability to understand (cognition).  Work and work Statistician.  Reproductive health. Screening  You may have the following tests or measurements:  Height, weight, and BMI.  Blood pressure.  Lipid and cholesterol levels. These may be checked every 5 years, or more frequently if you are over 8 years old.  Skin check.  Lung cancer screening. You may have this screening every year starting at age 61 if you have a 30-pack-year history of smoking and currently smoke or have quit within the past 15 years.  Fecal occult blood test (FOBT) of the stool. You may have this test every year starting at age 14.  Flexible sigmoidoscopy or colonoscopy. You may have a sigmoidoscopy every 5 years or a colonoscopy every 10 years starting at age 56.  Hepatitis C blood test.  Hepatitis B blood test.  Sexually transmitted disease (STD) testing.  Diabetes screening. This is done by checking your blood sugar (glucose) after you have not eaten for a while (fasting). You may have this done every 1-3 years.  Bone density scan. This is done to screen for osteoporosis. You may have this done starting at age 78.  Mammogram. This may be done every 1-2 years. Talk to your health care provider about how often you should have regular mammograms. Talk with your health care provider about your test results, treatment options, and if necessary, the need for more tests. Vaccines  Your health care provider may recommend certain vaccines, such as:  Influenza vaccine. This is recommended every year.  Tetanus, diphtheria, and acellular pertussis (Tdap, Td) vaccine. You may need a Td booster every 10 years.  Zoster vaccine. You may need this after age 19.  Pneumococcal 13-valent conjugate  (PCV13) vaccine. One dose is recommended after age 33.  Pneumococcal polysaccharide (PPSV23) vaccine. One dose is recommended after age 84. Talk to your health care provider about which screenings and vaccines you need and how often you need them. This information is not intended to replace advice given to you by your health care provider. Make sure you discuss any questions you have with your health care provider. Document Released: 12/23/2015 Document Revised: 08/15/2016 Document Reviewed: 09/27/2015 Elsevier Interactive Patient Education  2017 [employer] Prevention in the Home Falls can cause injuries. They can happen to people of all ages. There are many things you can do to make your home safe and to help prevent falls. What can I do on the outside of my home?  Regularly fix the edges of walkways and driveways and fix any cracks.  Remove anything that might make you trip as you walk through a door, such as a raised step or threshold.  Trim any bushes or trees on the path to your home.  Use bright outdoor lighting.  Clear any walking paths of anything that might make someone trip, such as rocks or tools.  Regularly check to see if handrails are loose or broken. Make sure that both sides of any steps have handrails.  Any raised decks and porches should have guardrails on the edges.  Have any leaves, snow, or ice cleared regularly.  Use sand or salt on walking paths during winter.  Clean up any spills in your garage right away. This includes oil or grease spills. What can I do in the bathroom?  Use night lights.  Install grab bars by the toilet and in the tub and shower. Do not use towel bars as grab bars.  Use non-skid mats or decals in the tub or shower.  If you need to sit down in the shower, use a plastic, non-slip stool.  Keep the floor dry. Clean up any water that spills on the floor as soon as it happens.  Remove soap buildup in the tub or shower  regularly.  Attach bath mats securely with double-sided non-slip rug tape.  Do not have throw rugs and other things on the floor that can make you trip. What can I do in the bedroom?  Use night lights.  Make sure that you have a light by your bed that is easy to reach.  Do not use any sheets or blankets that are too big for your bed. They should not hang down onto the floor.  Have a firm chair that has side arms. You can use this for support while you get dressed.  Do not have throw rugs and other things on the floor that can make you trip. What can I do in the kitchen?  Clean up any spills right away.  Avoid walking on wet floors.  Keep items that you use a lot in easy-to-reach places.  If you need to reach something above you, use a strong step stool that has a grab bar.  Keep electrical cords out of the way.  Do not use floor polish or wax that makes floors slippery. If you must use wax, use non-skid floor wax.  Do not have throw rugs and other things on the floor that  can make you trip. What can I do with my stairs?  Do not leave any items on the stairs.  Make sure that there are handrails on both sides of the stairs and use them. Fix handrails that are broken or loose. Make sure that handrails are as long as the stairways.  Check any carpeting to make sure that it is firmly attached to the stairs. Fix any carpet that is loose or worn.  Avoid having throw rugs at the top or bottom of the stairs. If you do have throw rugs, attach them to the floor with carpet tape.  Make sure that you have a light switch at the top of the stairs and the bottom of the stairs. If you do not have them, ask someone to add them for you. What else can I do to help prevent falls?  Wear shoes that:  Do not have high heels.  Have rubber bottoms.  Are comfortable and fit you well.  Are closed at the toe. Do not wear sandals.  If you use a stepladder:  Make sure that it is fully  opened. Do not climb a closed stepladder.  Make sure that both sides of the stepladder are locked into place.  Ask someone to hold it for you, if possible.  Clearly mark and make sure that you can see:  Any grab bars or handrails.  First and last steps.  Where the edge of each step is.  Use tools that help you move around (mobility aids) if they are needed. These include:  Canes.  Walkers.  Scooters.  Crutches.  Turn on the lights when you go into a dark area. Replace any light bulbs as soon as they burn out.  Set up your furniture so you have a clear path. Avoid moving your furniture around.  If any of your floors are uneven, fix them.  If there are any pets around you, be aware of where they are.  Review your medicines with your doctor. Some medicines can make you feel dizzy. This can increase your chance of falling. Ask your doctor what other things that you can do to help prevent falls. This information is not intended to replace advice given to you by your health care provider. Make sure you discuss any questions you have with your health care provider. Document Released: 09/22/2009 Document Revised: 05/03/2016 Document Reviewed: 12/31/2014 Elsevier Interactive Patient Education  2017 [employer].

## 2019-10-07 NOTE — Progress Notes (Signed)
Subjective:   Bridget Good is a 68 y.o. female who presents for Medicare Annual (Subsequent) preventive examination.  Review of Systems:  n/a Cardiac Risk Factors include: advanced age (>84mn, >>76women);sedentary lifestyle;obesity (BMI >30kg/m2);diabetes mellitus;hypertension     Objective:     Vitals: BP 120/78 (Patient Position: Sitting)    Pulse 62    Temp 98.9 F (37.2 C) (Oral)    Ht 5' 3.6" (1.615 m)    Wt 190 lb (86.2 kg)    BMI 33.02 kg/m   Body mass index is 33.02 kg/m.  Advanced Directives 10/07/2019 04/17/2019 03/09/2019 02/27/2019 02/27/2019 02/20/2019 12/18/2018  Does Patient Have a Medical Advance Directive? Yes No Yes Yes Yes Yes No  Type of Advance Directive - - [place] will [place] will [place] will [place] will -  Does patient want to make changes to medical advance directive? Yes (MAU/Ambulatory/Procedural Areas - Information given) - - No - Patient declined - No - Patient declined -  Copy of [place] Chart? - - No - copy requested No - copy requested No - copy requested No - copy requested -  Would patient like information on creating a medical advance directive? - - - - - - No - Patient declined  Pre-existing out of facility DNR order (yellow form or pink MOST form) - - - - - - -    Tobacco Social History   Tobacco Use  Smoking Status Never Smoker  Smokeless Tobacco Never Used     Counseling given: Not Answered   Clinical Intake:  Pre-visit preparation completed: Yes  Pain : 0-10 Pain Score: 7  Pain Type: Acute pain Pain Location: Shoulder Pain Orientation: Left Pain Radiating Towards: chest at times Pain Descriptors / Indicators: Sore Pain Onset: 1 to 4 weeks ago Pain Frequency: Intermittent Pain Relieving Factors: APAP and rest  Pain Relieving Factors: APAP and rest  Nutritional Status: BMI > 30   Obese Nutritional Risks: None Diabetes: Yes CBG done?: No Did pt. bring in CBG monitor from home?: No  How often do you need to have someone help you when you read instructions, pamphlets, or other written materials from your doctor or pharmacy?: 1 - Never What is the last grade level you completed in school?: 3 yrs college  Interpreter Needed?: No  Information entered by :: NAllen LPN  Past Medical History:  Diagnosis Date   Angina    Anxiety    Bronchitis    Depression    Diabetes mellitus    Fibromyalgia    GERD (gastroesophageal reflux disease)    Headache(784.0)    Hyperlipidemia    Hypertension    Hypertensive heart disease without CHF    Lupus ([place]    "treated for it from 1992 til 2012; dr said I don't have it anymore"   Obesity (BMI 30-39.9)    Osteoarthritis    Pneumonia    Shortness of breath    lying down, upon exertion   Shortness of breath on exertion    [place]   Past Surgical History:  Procedure Laterality Date   ABDOMINAL HYSTERECTOMY     partial   CARDIAC CATHETERIZATION  ~ 2007   CESAREAN SECTION  1978; 1981   COLONOSCOPY     CRANIOTOMY N/A 02/27/2019   Procedure: Endonasal Endoscopic biopsy of clival mass;  Surgeon: OJudith Part MD;  [place]  Service: Neurosurgery;  Laterality: N/A;  Endonasal Endoscopic biopsy of clival mass   ENDOSCOPIC TRANS NASAL APPROACH WITH FUSION N/A 02/27/2019   Procedure: ENDOSCOPIC TRANS NASAL APPROACH WITH FUSION;  Surgeon: Judith Part, MD;  Location: [place];  Service: Neurosurgery;  Laterality: N/A;  ENDOSCOPIC TRANS NASAL APPROACH WITH FUSION   FOOT SURGERY     "had to cut it to let the fluids out; it had swollen very badly; left foot"   Family History  Problem Relation Age of Onset   Heart attack Father    Diabetes Father    Hypertension Mother    Heart attack Brother    Kidney failure Brother    Diabetes Sister    Diabetes Sister     Social History   Socioeconomic History   Marital status: Married    Spouse name: Not on file   Number of children: 2   Years of education: Not on file   Highest education level: Not on file  Occupational History   Occupation: retired  Scientist, product/process development strain: Somewhat hard   Food insecurity    Worry: Sometimes true    Inability: Never true   Transportation needs    Medical: No    Non-medical: No  Tobacco Use   Smoking status: Never Smoker   Smokeless tobacco: Never Used  Substance and Sexual Activity   Alcohol use: No    Alcohol/week: 0.0 standard drinks   Drug use: No   Sexual activity: Yes    Partners: Male    Birth control/protection: Surgical  Lifestyle   Physical activity    Days per week: Not on file    Minutes per session: Not on file   Stress: Rather much  Relationships   Social connections    Talks on phone: More than three times a week    Gets together: More than three times a week    Attends religious service: More than 4 times per year    Active member of club or organization: Yes    Attends meetings of clubs or organizations: More than 4 times per year    Relationship status: Married  Other Topics Concern   Not on file  Social History Narrative   Married.  Husband is psychotherapist.  Several children.  Husband is Theme park manager of [employer] in [place].    Outpatient Encounter Medications as of 10/07/2019  Medication Sig   acetaminophen (TYLENOL) 500 MG tablet Take 1 tablet (500 mg total) by mouth as needed for mild pain.   amLODipine-benazepril (LOTREL) 10-40 MG capsule Take 1 capsule by mouth daily.   aspirin EC 81 MG tablet Take 1 tablet (81 mg total) by mouth daily.   blood glucose meter kit and supplies Dispense based on patient and insurance preference. Use up to four times daily as directed. (FOR ICD-10 E10.9, E11.9).   Blood Glucose Monitoring Suppl (FREESTYLE LITE) DEVI Check blood sugars twice daily    ciclopirox (PENLAC) 8 % solution Apply topically at bedtime. Apply over nail and surrounding skin. Apply daily over previous coat. Remove weekly with polish remover. (Patient taking differently: Apply 1 application topically at bedtime. Apply over nail and surrounding skin. Apply daily over previous coat. Remove weekly with polish remover.)   clopidogrel (PLAVIX) 75 MG tablet Take 1 tablet (75 mg total) by mouth daily.   empagliflozin (JARDIANCE) 10 MG TABS tablet Take 10 mg by mouth daily.   escitalopram (LEXAPRO) 5 MG tablet Take 1 tablet (5  mg total) by mouth at bedtime.   fluticasone (FLONASE) 50 MCG/ACT nasal spray Place into both nostrils daily.   Ginsengs-Royal Jelly-Vit B12 (GINSENG ROYAL JELLY PLUS) 375-1-15 MG-MG-MCG CAPS Take 1 capsule by mouth daily.   glucose blood test strip Use as instructed   glucose blood test strip Test blood sugars up to 4 times a day   Lancets (FREESTYLE) lancets Use as instructed   metoprolol succinate (TOPROL-XL) 50 MG 24 hr tablet Take 1 tablet (50 mg total) by mouth daily. Take with or immediately following a meal.   nitroGLYCERIN (NITROSTAT) 0.4 MG SL tablet Place 1 tablet (0.4 mg total) under the tongue every 5 (five) minutes as needed for chest pain.   pantoprazole (PROTONIX) 40 MG tablet Take one tablet by mouth daily in AM   potassium chloride SA (K-DUR) 20 MEQ tablet Take 1 tablet (20 mEq total) by mouth daily.   rosuvastatin (CRESTOR) 20 MG tablet Take 1 tablet (20 mg total) by mouth daily.   Vitamin D, Ergocalciferol, (DRISDOL) 1.25 MG (50000 UT) CAPS capsule TAKE 1 CAPSULE TWICE A WEEK ( MONDAY & THURSDAY)   clotrimazole-betamethasone (LOTRISONE) cream Apply to both feet and between toes bid x 4 weeks. (Patient not taking: Reported on 10/07/2019)   pneumococcal 13-valent conjugate vaccine (PREVNAR 13) SUSP injection Inject 0.5 mLs into the muscle tomorrow at 10 am for 1 dose.   Tdap (BOOSTRIX) 5-2.5-18.5 LF-MCG/0.5 injection  Inject 0.5 mLs into the muscle once for 1 dose.   No facility-administered encounter medications on file as of 10/07/2019.     Activities of Daily Living In your present state of health, do you have any difficulty performing the following activities: 10/07/2019 02/27/2019  Hearing? N N  Vision? Y N  Comment trouble seeing small print -  Difficulty concentrating or making decisions? Y N  Comment trouble concentrating on some conversation -  Walking or climbing stairs? Y Y  Comment bad knee -  Dressing or bathing? N N  Doing errands, shopping? N Y  Conservation officer, nature and eating ? N -  Using the Toilet? N -  In the past six months, have you accidently leaked urine? N -  Do you have problems with loss of bowel control? N -  Managing your Medications? N -  Managing your Finances? N -  Housekeeping or managing your Housekeeping? N -  Some recent data might be hidden    Patient Care Team: [name], FNP as PCP - General (General Practice) Nahser, Wonda Cheng, MD as PCP - Cardiology (Cardiology) Jacolyn Reedy, MD as Consulting Physician (Cardiology) Lavera Guise, [facility] (Pharmacist) Rex Kras Claudette Stapler, RN as Case Manager    Assessment:   This is a routine wellness examination for Jame.  Exercise Activities and Dietary recommendations Current Exercise Habits: The patient does not participate in regular exercise at present  Goals     "I am having left arm pain" (pt-stated)     Current Barriers:   Knowledge Deficits related to diagnosis and treatment for left arm pain  Nurse Case Manager Clinical Goal(s):   Over the next 30 days, patient will work with PCP provider [name], FNP to address needs related to new onset of left arm pain  CCM RN CM Interventions:  10/02/19 call completed with patient    Evaluation of current treatment plan related to acute onset of left arm pain and patient's adherence to plan as established by provider  Assessed for potential causes of  pain, patient denies  injury, reports pain is relieved with Tylenol but returns after the medication wears off and often wakes her up during sleep, patient reports pain started about 1 week ago  Advised patient to consider following up with PCP provider [name], FNP for further evaluation of new onset of left arm pain; added patient to PCP schedule for 10/07/19 _0 :15 AM   Provided education to patient re: signs and symptoms suggestive of an MI; discussed symptoms for women can be much different than for men; instructed patient to call 911 for Pain or discomfort in one or both arms, the back, neck, jaw or stomach; uncomfortable pressure, squeezing, fullness or pain in the center of the chest; shortness of breath with or without chest discomfort; breaking out in a cold sweat, nausea or lightheadedness  Collaborated with [name], FNP via in basket message regarding patients c/o new onset of left arm pain  Discussed plans with patient for ongoing care management follow up and provided patient with direct contact information for care management team  Patient Self Care Activities:   Unable to independently perform Self Care  Initial goal documentation       "I am taking my Vitamin D as directed" (pt-stated)     Current Barriers:   Knowledge Deficits related to potential complications secondary to Vitamin D deficiency   Nurse Case Manager Clinical Goal(s):   Over the next 30 days, patient will verbalize basic understanding of the potential complications that may occur secondary to Vitamin D deficiency and self health management plan as evidenced by patient will eat a Vitamin D rich diet and report 100% adherence to following her pharmacological recommendations  Over the next 90 days, patient will have a repeat Vitamin D level redrawn and her level will be WNL  CCM RN CM Interventions: 10/02/19 call completed with patient     Evaluation of current treatment plan related to Vitamin  D deficieny and patient's adherence to plan as established by provider  Reviewed and discussed patient's last Vitamin D level of 7.5 obtained on 05/14/19  Assessed for adherence to patient taking her Vitamin D as prescribed, 50,000 UT twice weekly on Mondays and Thursdays - patient reports adherence  Sent an in basket message to PCP provider [name], FNP requesting a Vitamin D recheck with next OV   Discussed plans with patient for ongoing care management follow up and provided patient with direct contact information for care management team  Patient Self Care Activities:   Currently UNABLE TO independently perform self care  Please see past updates related to this goal by clicking on the "Past Updates" button in the selected goal       "I have to use my magnifying glass to see my medicines" (pt-stated)     Current Barriers:   Knowledge Deficits related to impaired vision   Nurse Case Manager Clinical Goal(s):   Over the next 60 days, patient will attend all scheduled medical appointments: including her eye exam with [facility] scheduled for 06/22/19.  Goal Not Met  08/04/19 Over the next 60 days, patient will follow up with an Eye Specialist to evaluate impaired vision.   CCM RN CM Interventions:  10/02/19 completed call with patient   Evaluation of current treatment plan related to impaired vision and patient's adherence to plan as established by provider.  Advised patient to call her established eye Specialist, Eugenio Saenz to reschedule her eye exam; provided patient with the contact information and encouraged her to record  this information as well as a reminder to schedule the appointment on her calendar  Provided education to patient re: the importance of adhering to an annual eye exam to evaluate for diabetic retinopathy and or other potential optic complications; provided verbal education related to potential eye/vision complications that may occur  secondary to diabetes   Discussed plans with patient for ongoing care management follow up and provided patient with direct contact information for care management team  Patient Self Care Activities:   Self administers medications as prescribed  Attends all scheduled provider appointments  Calls pharmacy for medication refills  Performs ADL's independently  Performs IADL's independently  Calls provider office for new concerns or questions  Please see past updates related to this goal by clicking on the "Past Updates" button in the selected goal        HEMOGLOBIN A1C < 7.0     I would like to get my HgbA1c below 7     I would like to continue to control my BGs and keep my A1c<7% (pt-stated)     Current Barriers:   Patient's current BG meter is malfunctioning-->requested new meter (completed)  A1C 7.5 (05/14/19)   Pharmacist Clinical Goal(s):   Over the next 14 days, patient will obtain free BG meter through insurance: FreeStyle, Precision or One Touch meters are preferred through [facility] Completed  Over the next 30 days, patient will obtain new glucometer to be able to check & maintain BG with help from husband & [place]. Goal Not Met   06/15/19: Over the next 30 days, patient will have received a new glucometer from [facility] and will be able to comfortably self monitor her CBG's GOAL NOT MET  06/15/19: Over the next 60 days, patient will have a running log of her BG readings, measuring and recording her readings at least once daily. GOAL NOT MET  New - 10/06/19  Over the next 30 days, patient will work with embedded Pharm D [name] on how to use her glucometer   CCM PharmD Interventions: 06/25/19 call completed with patient  Discussed plans with patient for ongoing care management follow up and provided patient with direct contact information for care management team  Collaboration with provider re: medication management-->requested RX for test strips,  lancets.  Pharmacist needed new instructions on RX for billing purposes.  Meter and supplies will be delivered to patient tomorrow.  Patient and pharmacist verbalized understanding  Patient has received new glucometer, however she has still not been able to successfully check blood sugar.  She states she has been trying to use it, but does not feel comfortable yet.  Patient has a Building surveyor.  Encouraged patient to come to clinic on Monday or Wednesday of next week to set up glucometer.   CCM RN CM Interventions:  10/02/19 call completed with patient    Evaluation of current treatment plan related to Diabetes and patient's adherence to plan as established by provider.  Provided education to patient re: A1C is up to 7.5 from 7.0 obtained on 05/14/19; discussed the target goal for A1C <7.0; reinforced importance of adhering to prescribed DM treatment plan including taking all diabetic medications exactly as prescribed, monitor FBS and follow established protocol for hypo/hyperglycemic episodes, implementing exercise regularly into daily routine, at least 150 minutes weekly, adhere to strictly following a diabetic friendly diet with Meal planning using the plate method; reinforced the importance of lowering A1C in order to help prevent potential complications that may  occur as a result of having uncontrolled DM  Reviewed medications with patient and discussed that patient continues to use her pill package system and reports she is taking all medications exactly as prescribed w/o missed doses   Collaborated with embedded Pharm D [name]  regarding patient is not checking her CBG's due to still not feeling comfortable using her glucometer; advised patient agreed to bring her glucometer into the office during her OV on 10/07/19 in order for [name] to instruct patient and her spouse on how to use the glucometer  Discussed plans with patient for ongoing care management follow up and  provided patient with direct contact information for care management team  Advised patient, providing education and rationale, to check cbg daily before meals and record, calling the CCM team and or PCP provider for findings outside established parameters.  <80 and or >250   Please see past updates related to this goal by clicking on the "Past Updates" button in the selected goal       Weight (lb) < 200 lb (90.7 kg)     10/07/2019, wants to lose weight ,but does not have a set goal weight       Fall Risk Fall Risk  10/07/2019 10/07/2019 06/16/2019 05/14/2019 02/04/2019  Falls in the past year? 0 0 0 0 1  Number falls in past yr: 0 - - - 0  Injury with Fall? - - - - 0  Risk Factor Category  - - - - -  Risk for fall due to : Medication side effect - - - -  Follow up Falls evaluation completed;Education provided;Falls prevention discussed - - - -   Is the patient's home free of loose throw rugs in walkways, pet beds, electrical cords, etc?   yes      Grab bars in the bathroom? yes      Handrails on the stairs?   yes      Adequate lighting?   yes  Timed Get Up and Go performed: n/a  Depression Screen PHQ 2/9 Scores 10/07/2019 10/07/2019 05/14/2019 03/09/2019  PHQ - 2 Score 2 1 0 3  PHQ- 9 Score 3 3 - 9     Cognitive Function MMSE - Mini Mental State Exam 06/16/2019  Orientation to time 4  Orientation to Place 5  Registration 3  Attention/ Calculation 5  Recall 0  Language- name 2 objects 2  Language- repeat 1  Language- follow 3 step command 2  Language- read & follow direction 0  Write a sentence 1  Copy design 1  Total score 24     6CIT Screen 10/07/2019  What Year? 0 points  What month? 0 points  What time? 0 points  Count back from 20 0 points  Months in reverse 0 points  Repeat phrase 6 points  Total Score 6    Immunization History  Administered Date(s) Administered   Influenza Inj Mdck Quad Pf 01/02/2018   Influenza, High Dose Seasonal PF 09/19/2018    Influenza,inj,Quad PF,6+ Mos 12/06/2016   Pneumococcal Polysaccharide-23 11/26/2018    Qualifies for Shingles Vaccine? yes  Screening Tests Health Maintenance  Topic Date Due   FOOT EXAM  01/24/1963   TETANUS/TDAP  01/25/1972   DEXA SCAN  01/24/2018   OPHTHALMOLOGY EXAM  06/26/2019   HEMOGLOBIN A1C  11/13/2019   PNA vac Low Risk Adult (2 of 2 - PCV13) 11/27/2019   MAMMOGRAM  01/30/2021   COLONOSCOPY  03/29/2025   INFLUENZA VACCINE  Completed  Hepatitis C Screening  Completed    Cancer Screenings: Lung: Low Dose CT Chest recommended if Age 19-80 years, 30 pack-year currently smoking OR have quit w/in 15years. Patient does not qualify. Breast:  Up to date on Mammogram? Yes   Up to date of Bone Density/Dexa? No Colorectal: up to date  Additional Screenings: : Hepatitis C Screening: 05/2019     Plan:    Patient wants to lose weight, but does not have a goal weight set   I have personally reviewed and noted the following in the patients chart:    Medical and social history  Use of alcohol, tobacco or illicit drugs   Current medications and supplements  Functional ability and status  Nutritional status  Physical activity  Advanced directives  List of other physicians  Hospitalizations, surgeries, and ER visits in previous 12 months  Vitals  Screenings to include cognitive, depression, and falls  Referrals and appointments  In addition, I have reviewed and discussed with patient certain preventive protocols, quality metrics, and best practice recommendations. A written personalized care plan for preventive services as well as general preventive health recommendations were provided to patient.     Kellie Simmering, LPN  [id number]

## 2019-10-07 NOTE — Progress Notes (Signed)
Subjective:     Patient ID: Bridget Good , female    DOB: [birth year] , 68 y.o.   MRN: [id number]   Chief Complaint  Patient presents with  . Shoulder Pain    HPI  She is now doing a pill pack.  She forgot to bring her glucometer today.  Sometimes she will forget to check   Diabetes She presents for her follow-up diabetic visit. She has type 2 diabetes mellitus. Her disease course has been stable. Pertinent negatives for hypoglycemia include no confusion, dizziness, headaches or nervousness/anxiousness. There are no diabetic associated symptoms. Pertinent negatives for diabetes include no chest pain, no fatigue, no polydipsia, no polyphagia and no polyuria. There are no hypoglycemic complications. Symptoms are stable. There are no diabetic complications. Risk factors for coronary artery disease include sedentary lifestyle and stress. Current diabetic treatment includes oral agent (dual therapy). She is compliant with treatment some of the time. Her weight is increasing steadily. She is following a diabetic diet. When asked about meal planning, she reported none. She rarely participates in exercise. There is no change in her home blood glucose trend. An ACE inhibitor/angiotensin II receptor blocker is being taken. Eye exam is current.  Hypertension This is a chronic problem. The current episode started more than 1 year ago. The problem has been gradually improving since onset. The problem is controlled. Pertinent negatives include no anxiety, chest pain, headaches, malaise/fatigue, palpitations or shortness of breath. There are no associated agents to hypertension. Risk factors for coronary artery disease include sedentary lifestyle, obesity and dyslipidemia. Past treatments include angiotensin blockers. There are no compliance problems.  There is no history of angina or kidney disease. There is no history of chronic renal disease.  Arm Pain  The incident occurred more than 1 week ago (3 weeks  ago). There was no injury mechanism (She has been lifting her 94 year old grandson she has picking ). The pain is present in the left shoulder. The quality of the pain is described as aching. The pain does not radiate. The pain is mild. Pertinent negatives include no chest pain. Exacerbated by: Night time is worse, sometimes will have her arm under her pillow. She has tried acetaminophen (she is also using "sav" with good results) for the symptoms.     Past Medical History:  Diagnosis Date  . Angina   . Anxiety   . Bronchitis   . Depression   . Diabetes mellitus   . Fibromyalgia   . GERD (gastroesophageal reflux disease)   . Headache(784.0)   . Hyperlipidemia   . Hypertension   . Hypertensive heart disease without CHF   . Lupus ([place])    "treated for it from 1992 til 2012; dr said I don't have it anymore"  . Obesity (BMI 30-39.9)   . Osteoarthritis   . Pneumonia   . Shortness of breath    lying down, upon exertion  . Shortness of breath on exertion   . Stroke [facility])    2019     Family History  Problem Relation Age of Onset  . Heart attack Father   . Diabetes Father   . Hypertension Mother   . Heart attack Brother   . Kidney failure Brother   . Diabetes Sister   . Diabetes Sister      Current Outpatient Medications:  .  acetaminophen (TYLENOL) 500 MG tablet, Take 1 tablet (500 mg total) by mouth as needed for mild pain., Disp: 90 tablet, Rfl: 1 .  amLODipine-benazepril (LOTREL) 10-40 MG capsule, Take 1 capsule by mouth daily., Disp: 90 capsule, Rfl: 1 .  aspirin EC 81 MG tablet, Take 1 tablet (81 mg total) by mouth daily., Disp: 90 tablet, Rfl: 1 .  blood glucose meter kit and supplies, Dispense based on patient and insurance preference. Use up to four times daily as directed. (FOR ICD-10 E10.9, E11.9)., Disp: 1 each, Rfl: 0 .  Blood Glucose Monitoring Suppl (FREESTYLE LITE) DEVI, Check blood sugars twice daily, Disp: 1 each, Rfl: 1 .  ciclopirox (PENLAC) 8 % solution, Apply  topically at bedtime. Apply over nail and surrounding skin. Apply daily over previous coat. Remove weekly with polish remover. (Patient taking differently: Apply 1 application topically at bedtime. Apply over nail and surrounding skin. Apply daily over previous coat. Remove weekly with polish remover.), Disp: 6.6 mL, Rfl: 11 .  clopidogrel (PLAVIX) 75 MG tablet, Take 1 tablet (75 mg total) by mouth daily., Disp: 90 tablet, Rfl: 1 .  clotrimazole-betamethasone (LOTRISONE) cream, Apply to both feet and between toes bid x 4 weeks. (Patient not taking: Reported on 10/07/2019), Disp: 45 g, Rfl: 1 .  empagliflozin (JARDIANCE) 10 MG TABS tablet, Take 10 mg by mouth daily., Disp: 90 tablet, Rfl: 3 .  escitalopram (LEXAPRO) 5 MG tablet, Take 1 tablet (5 mg total) by mouth at bedtime., Disp: 90 tablet, Rfl: 1 .  fluticasone (FLONASE) 50 MCG/ACT nasal spray, Place into both nostrils daily., Disp: , Rfl:  .  Ginsengs-Royal Jelly-Vit B12 (GINSENG ROYAL JELLY PLUS) 375-1-15 MG-MG-MCG CAPS, Take 1 capsule by mouth daily., Disp: 90 capsule, Rfl: 1 .  glucose blood test strip, Use as instructed, Disp: 100 each, Rfl: 12 .  glucose blood test strip, Test blood sugars up to 4 times a day, Disp: 100 each, Rfl: 12 .  Lancets (FREESTYLE) lancets, Use as instructed, Disp: 100 each, Rfl: 12 .  metoprolol succinate (TOPROL-XL) 50 MG 24 hr tablet, Take 1 tablet (50 mg total) by mouth daily. Take with or immediately following a meal., Disp: 90 tablet, Rfl: 1 .  nitroGLYCERIN (NITROSTAT) 0.4 MG SL tablet, Place 1 tablet (0.4 mg total) under the tongue every 5 (five) minutes as needed for chest pain., Disp: 25 tablet, Rfl: 12 .  pantoprazole (PROTONIX) 40 MG tablet, Take one tablet by mouth daily in AM, Disp: 90 tablet, Rfl: 1 .  potassium chloride SA (K-DUR) 20 MEQ tablet, Take 1 tablet (20 mEq total) by mouth daily., Disp: 90 tablet, Rfl: 1 .  rosuvastatin (CRESTOR) 20 MG tablet, Take 1 tablet (20 mg total) by mouth daily., Disp:  90 tablet, Rfl: 1 .  Vitamin D, Ergocalciferol, (DRISDOL) 1.25 MG (50000 UT) CAPS capsule, TAKE 1 CAPSULE TWICE A WEEK ( MONDAY & THURSDAY), Disp: 24 capsule, Rfl: 1 .  pneumococcal 13-valent conjugate vaccine (PREVNAR 13) SUSP injection, Inject 0.5 mLs into the muscle tomorrow at 10 am for 1 dose., Disp: 0.5 mL, Rfl: 0 .  Tdap (BOOSTRIX) 5-2.5-18.5 LF-MCG/0.5 injection, Inject 0.5 mLs into the muscle once for 1 dose., Disp: 0.5 mL, Rfl: 0   Allergies  Allergen Reactions  . Sulfa Antibiotics Hives, Itching and Nausea And Vomiting     Review of Systems  Constitutional: Negative for fatigue and malaise/fatigue.  Respiratory: Negative for shortness of breath.   Cardiovascular: Negative for chest pain and palpitations.  Endocrine: Negative for polydipsia, polyphagia and polyuria.  Neurological: Negative.  Negative for dizziness and headaches.  Psychiatric/Behavioral: Negative for confusion. The patient is not nervous/anxious.  Today's Vitals   10/07/19 0942  BP: 120/78  Pulse: 62  Temp: 98.9 F (37.2 C)  TempSrc: Oral  Weight: 190 lb 9.6 oz (86.5 kg)  Height: 5' 3.6" (1.615 m)   Body mass index is 33.13 kg/m.   Objective:  Physical Exam Vitals signs reviewed.  Constitutional:      Appearance: She is well-developed.  HENT:     Head: Normocephalic and atraumatic.  Eyes:     Pupils: Pupils are equal, round, and reactive to light.  Cardiovascular:     Rate and Rhythm: Normal rate and regular rhythm.     Pulses: Normal pulses.     Heart sounds: Normal heart sounds. No murmur.  Pulmonary:     Effort: Pulmonary effort is normal. No respiratory distress.     Breath sounds: Normal breath sounds.  Musculoskeletal: Normal range of motion.  Skin:    General: Skin is warm and dry.     Capillary Refill: Capillary refill takes less than 2 seconds.  Neurological:     General: No focal deficit present.     Mental Status: She is alert and oriented to person, place, and time.      Cranial Nerves: No cranial nerve deficit.  Psychiatric:        Mood and Affect: Mood normal. Mood is not anxious or depressed.        Speech: Speech normal.        Behavior: Behavior normal.         Assessment And Plan:     1. Acute pain of left shoulder  This is likely bursitis treated with toradol 30 mg IM  2. Type 2 diabetes mellitus without complication, without long-term current use of insulin (HCC)  Chronic, fair control  Continue with current medications   She did not bring her glucometer with her to review or ensure she is using correctly  Also made [place] aware patient was in office  Encouraged to limit intake of sugary foods and drinks  Encouraged to increase physical activity to 150 minutes per week as tolerated  3. Essential hypertension Chronic, her blood pressure is much better  4. Depression, unspecified depression type  Chronic, slightly improved per patient  No change to her depression screen   [name], FNP    THE PATIENT IS ENCOURAGED TO PRACTICE SOCIAL DISTANCING DUE TO THE COVID-19 PANDEMIC.

## 2019-10-08 LAB — BMP8+EGFR
BUN/Creatinine Ratio: 12 (ref 12–28)
BUN: 16 mg/dL (ref 8–27)
CO2: 22 mmol/L (ref 20–29)
Calcium: 9.5 mg/dL (ref 8.7–10.3)
Chloride: 104 mmol/L (ref 96–106)
Creatinine, Ser: 1.3 mg/dL — ABNORMAL HIGH (ref 0.57–1.00)
GFR calc Af Amer: 49 mL/min/{1.73_m2} — ABNORMAL LOW (ref >59)
GFR calc non Af Amer: 43 mL/min/{1.73_m2} — ABNORMAL LOW (ref >59)
Glucose: 128 mg/dL — ABNORMAL HIGH (ref 65–99)
Potassium: 4.4 mmol/L (ref 3.5–5.2)
Sodium: 140 mmol/L (ref 134–144)

## 2019-10-08 LAB — LIPID PANEL
Chol/HDL Ratio: 2.5 ratio (ref 0.0–4.4)
Cholesterol, Total: 171 mg/dL (ref 100–199)
HDL: 69 mg/dL (ref >39)
LDL Chol Calc (NIH): 88 mg/dL (ref 0–99)
Triglycerides: 73 mg/dL (ref 0–149)
VLDL Cholesterol Cal: 14 mg/dL (ref 5–40)

## 2019-10-08 LAB — HEMOGLOBIN A1C
Est. average glucose Bld gHb Est-mCnc: 157 mg/dL
Hgb A1c MFr Bld: 7.1 % — ABNORMAL HIGH (ref 4.8–5.6)

## 2019-10-08 NOTE — Progress Notes (Signed)
Chronic Care Management   Visit Note  10/07/2019 Name: Bridget Good MRN: [id number] DOB: 03/19/1951  Referred by: [name], FNP Reason for referral : Chronic Care Management   Bridget Good is a 68 y.o. year old female who is a primary care patient of [name], [place]. The CCM team was consulted for assistance with chronic disease management and care coordination needs related to DMII  Review of patient status, including review of consultants reports, relevant laboratory and other test results, and collaboration with appropriate care team members and the patient's provider was performed as part of comprehensive patient evaluation and provision of chronic care management services.    I met with Bridget Good and her husband in clinic today  Advanced Directives Status: [place] and [place] application for related entries.   Medications: Outpatient Encounter Medications as of 10/07/2019  Medication Sig   acetaminophen (TYLENOL) 500 MG tablet Take 1 tablet (500 mg total) by mouth as needed for mild pain.   amLODipine-benazepril (LOTREL) 10-40 MG capsule Take 1 capsule by mouth daily.   aspirin EC 81 MG tablet Take 1 tablet (81 mg total) by mouth daily.   blood glucose meter kit and supplies Dispense based on patient and insurance preference. Use up to four times daily as directed. (FOR ICD-10 E10.9, E11.9).   Blood Glucose Monitoring Suppl (FREESTYLE LITE) DEVI Check blood sugars twice daily   ciclopirox (PENLAC) 8 % solution Apply topically at bedtime. Apply over nail and surrounding skin. Apply daily over previous coat. Remove weekly with polish remover. (Patient taking differently: Apply 1 application topically at bedtime. Apply over nail and surrounding skin. Apply daily over previous coat. Remove weekly with polish remover.)   clopidogrel (PLAVIX) 75 MG tablet Take 1 tablet (75 mg total) by mouth daily.   clotrimazole-betamethasone (LOTRISONE) cream Apply to  both feet and between toes bid x 4 weeks. (Patient not taking: Reported on 10/07/2019)   diclofenac sodium (VOLTAREN) 1 % GEL Apply 2 g topically 4 (four) times daily.   empagliflozin (JARDIANCE) 10 MG TABS tablet Take 10 mg by mouth daily.   escitalopram (LEXAPRO) 5 MG tablet Take 1 tablet (5 mg total) by mouth at bedtime.   fluticasone (FLONASE) 50 MCG/ACT nasal spray Place into both nostrils daily.   Ginsengs-Royal Jelly-Vit B12 (GINSENG ROYAL JELLY PLUS) 375-1-15 MG-MG-MCG CAPS Take 1 capsule by mouth daily.   glucose blood test strip Use as instructed   glucose blood test strip Test blood sugars up to 4 times a day   Lancets (FREESTYLE) lancets Use as instructed   metoprolol succinate (TOPROL-XL) 50 MG 24 hr tablet Take 1 tablet (50 mg total) by mouth daily. Take with or immediately following a meal.   nitroGLYCERIN (NITROSTAT) 0.4 MG SL tablet Place 1 tablet (0.4 mg total) under the tongue every 5 (five) minutes as needed for chest pain.   pantoprazole (PROTONIX) 40 MG tablet Take one tablet by mouth daily in AM   [EXPIRED] pneumococcal 13-valent conjugate vaccine (PREVNAR 13) SUSP injection Inject 0.5 mLs into the muscle tomorrow at 10 am for 1 dose.   potassium chloride SA (K-DUR) 20 MEQ tablet Take 1 tablet (20 mEq total) by mouth daily.   rosuvastatin (CRESTOR) 20 MG tablet Take 1 tablet (20 mg total) by mouth daily.   [EXPIRED] Tdap (BOOSTRIX) 5-2.5-18.5 LF-MCG/0.5 injection Inject 0.5 mLs into the muscle once for 1 dose.   Vitamin D, Ergocalciferol, (DRISDOL) 1.25 MG (50000 UT) CAPS capsule TAKE 1  CAPSULE TWICE A WEEK ( [place])   No facility-administered encounter medications on file as of 10/07/2019.      Objective:   Goals Addressed            This Visit's Progress     Patient Stated    I would like to continue to control my BGs and keep my A1c<7% (pt-stated)       Current Barriers:   Patient's current BG meter is malfunctioning-->requested  new meter (completed)  A1C 7.5 (05/14/19)   Pharmacist Clinical Goal(s):   Over the next 14 days, patient will obtain free BG meter through insurance: FreeStyle, Precision or One Touch meters are preferred through [employer] Completed  Over the next 30 days, patient will obtain new glucometer to be able to check & maintain BG with help from husband & [place]. Goal Not Met   06/15/19: Over the next 30 days, patient will have received a new glucometer from [place] and will be able to comfortably self monitor her CBG's GOAL NOT MET  06/15/19: Over the next 60 days, patient will have a running log of her BG readings, measuring and recording her readings at least once daily. GOAL NOT MET  New - 10/06/19  Over the next 30 days, patient will work with embedded Pharm D [name] on how to use her glucometer   CCM PharmD Interventions: 10/07/19 clinic visit with patient  Discussed plans with patient for ongoing care management follow up and provided patient with direct contact information for care management team  Collaboration with provider re: medication management-->requested RX for test strips, lancets.  Pharmacist needed new instructions on RX for billing purposes.  Meter and supplies will be delivered to patient tomorrow.  Patient and pharmacist verbalized understanding  Patient has received new glucometer, however she did not have it with her at appt today.  Reminded patient to bring next week.  Will reach out at that time.  A1c has improved to 7.1% on 10/07/19, therefore patient appears to be compliant with Jardiance and diet/lifestyle.  Jardiance 57m samples given at today's visit (13-month    Encouraged patient to come to clinic on Monday or Wednesday to set up glucometer.   CCM RN CM Interventions:  10/02/19 call completed with patient    Evaluation of current treatment plan related to Diabetes and patient's adherence to plan as established by provider.  Provided  education to patient re: A1C is up to 7.5 from 7.0 obtained on 05/14/19; discussed the target goal for A1C <7.0; reinforced importance of adhering to prescribed DM treatment plan including taking all diabetic medications exactly as prescribed, monitor FBS and follow established protocol for hypo/hyperglycemic episodes, implementing exercise regularly into daily routine, at least 150 minutes weekly, adhere to strictly following a diabetic friendly diet with Meal planning using the plate method; reinforced the importance of lowering A1C in order to help prevent potential complications that may occur as a result of having uncontrolled DM  Reviewed medications with patient and discussed that patient continues to use her pill package system and reports she is taking all medications exactly as prescribed w/o missed doses   Collaborated with embedded Pharm D JuLottie Dawsonregarding patient is not checking her CBG's due to still not feeling comfortable using her glucometer; advised patient agreed to bring her glucometer into the office during her OV on 10/07/19 in order for JuAlmyra Freeo instruct patient and her spouse on how to use the glucometer  Discussed  plans with patient for ongoing care management follow up and provided patient with direct contact information for care management team  Advised patient, providing education and rationale, to check cbg daily before meals and record, calling the CCM team and or PCP provider for findings outside established parameters.  <80 and or >250   Please see past updates related to this goal by clicking on the "Past Updates" button in the selected goal          Plan:   The care management team will reach out to the patient again over the next 7-14 days.   Provider Signature Regina Eck, PharmD, BCPS Clinical Pharmacist, [facility] [place]: [phone]

## 2019-10-08 NOTE — Patient Instructions (Signed)
Visit Information  Goals Addressed            This Visit's Progress     Patient Stated   . I would like to continue to control my BGs and keep my A1c<7% (pt-stated)       Current Barriers:   Patient's current BG meter is malfunctioning-->requested new meter (completed)  A1C 7.5 (05/14/19)   Pharmacist Clinical Goal(s):   Over the next 14 days, patient will obtain free BG meter through insurance: FreeStyle, Precision or One Touch meters are preferred through [employer] Completed  Over the next 30 days, patient will obtain new glucometer to be able to check & maintain BG with help from husband & [place]. Goal Not Met   06/15/19: Over the next 30 days, patient will have received a new glucometer from [place] and will be able to comfortably self monitor her CBG's GOAL NOT MET  06/15/19: Over the next 60 days, patient will have a running log of her BG readings, measuring and recording her readings at least once daily. GOAL NOT MET  New - 10/06/19  Over the next 30 days, patient will work with embedded Pharm D [name] on how to use her glucometer   CCM PharmD Interventions: 10/07/19 clinic visit with patient  Discussed plans with patient for ongoing care management follow up and provided patient with direct contact information for care management team  Collaboration with provider re: medication management-->requested RX for test strips, lancets.  Pharmacist needed new instructions on RX for billing purposes.  Meter and supplies will be delivered to patient tomorrow.  Patient and pharmacist verbalized understanding  Patient has received new glucometer, however she did not have it with her at appt today.  Reminded patient to bring next week.  Will reach out at that time.  A1c has improved to 7.1% on 10/07/19, therefore patient appears to be compliant with Jardiance and diet/lifestyle.  Jardiance 42m samples given at today's visit (1106-month    Encouraged patient to come  to clinic on Monday or Wednesday to set up glucometer.   CCM RN CM Interventions:  10/02/19 call completed with patient   . Evaluation of current treatment plan related to Diabetes and patient's adherence to plan as established by provider. . Provided education to patient re: A1C is up to 7.5 from 7.0 obtained on 05/14/19; discussed the target goal for A1C <7.0; reinforced importance of adhering to prescribed DM treatment plan including taking all diabetic medications exactly as prescribed, monitor FBS and follow established protocol for hypo/hyperglycemic episodes, implementing exercise regularly into daily routine, at least 150 minutes weekly, adhere to strictly following a diabetic friendly diet with Meal planning using the plate method; reinforced the importance of lowering A1C in order to help prevent potential complications that may occur as a result of having uncontrolled DM . Reviewed medications with patient and discussed that patient continues to use her pill package system and reports she is taking all medications exactly as prescribed w/o missed doses  . Collaborated with embedded Pharm D JuLottie Dawsonregarding patient is not checking her CBG's due to still not feeling comfortable using her glucometer; advised patient agreed to bring her glucometer into the office during her OV on 10/07/19 in order for JuAlmyra Freeo instruct patient and her spouse on how to use the glucometer . Discussed plans with patient for ongoing care management follow up and provided patient with direct contact information for care management team . Advised patient, providing education  and rationale, to check cbg daily before meals and record, calling the CCM team and or PCP provider for findings outside established parameters.  <80 and or >250   Please see past updates related to this goal by clicking on the "Past Updates" button in the selected goal         The patient verbalized understanding of instructions  provided today and declined a print copy of patient instruction materials.   The care management team will reach out to the patient again over the next 7-14 business days.   SIGNATURE Regina Eck, PharmD, BCPS Clinical Pharmacist, [facility] [place]: [phone]

## 2019-10-12 ENCOUNTER — Ambulatory Visit: Payer: Self-pay | Admitting: Pharmacist

## 2019-10-12 NOTE — Progress Notes (Signed)
  Chronic Care Management   Outreach Note  10/12/2019 Name: Bridget Good MRN: [id number] DOB: 1951/12/07  Referred by: [name], FNP Reason for referral : Chronic Care Management   An unsuccessful telephone outreach was attempted today. The patient was referred to the case management team by for assistance with care management and care coordination.   Follow Up Plan: A HIPPA compliant phone message was left for the patient providing contact information and requesting a return call.  The care management team will reach out to the patient again over the next 7-10 days.   SIGNATURE Regina Eck, PharmD, BCPS Clinical Pharmacist, [facility] [place]: [phone]

## 2019-10-15 ENCOUNTER — Other Ambulatory Visit: Payer: Self-pay | Admitting: Nurse Practitioner

## 2019-10-22 ENCOUNTER — Telehealth: Payer: Self-pay

## 2019-11-02 ENCOUNTER — Encounter: Payer: Self-pay | Admitting: Podiatry

## 2019-11-02 ENCOUNTER — Ambulatory Visit (INDEPENDENT_AMBULATORY_CARE_PROVIDER_SITE_OTHER): Payer: PPO | Admitting: Podiatry

## 2019-11-02 ENCOUNTER — Ambulatory Visit: Payer: Self-pay

## 2019-11-02 ENCOUNTER — Other Ambulatory Visit: Payer: Self-pay

## 2019-11-02 DIAGNOSIS — B351 Tinea unguium: Secondary | ICD-10-CM

## 2019-11-02 DIAGNOSIS — M79674 Pain in right toe(s): Secondary | ICD-10-CM

## 2019-11-02 DIAGNOSIS — E119 Type 2 diabetes mellitus without complications: Secondary | ICD-10-CM

## 2019-11-02 DIAGNOSIS — M79675 Pain in left toe(s): Secondary | ICD-10-CM | POA: Diagnosis not present

## 2019-11-02 DIAGNOSIS — G3184 Mild cognitive impairment, so stated: Secondary | ICD-10-CM

## 2019-11-02 DIAGNOSIS — I10 Essential (primary) hypertension: Secondary | ICD-10-CM

## 2019-11-02 MED ORDER — NONFORMULARY OR COMPOUNDED ITEM: 3 refills | Status: DC

## 2019-11-02 NOTE — Patient Instructions (Signed)
Diabetes Mellitus and Foot Care Foot care is an important part of your health, especially when you have diabetes. Diabetes may cause you to have problems because of poor blood flow (circulation) to your feet and legs, which can cause your skin to:  Become thinner and drier.  Break more easily.  Heal more slowly.  Peel and crack. You may also have nerve damage (neuropathy) in your legs and feet, causing decreased feeling in them. This means that you may not notice minor injuries to your feet that could lead to more serious problems. Noticing and addressing any potential problems early is the best way to prevent future foot problems. How to care for your feet Foot hygiene  Wash your feet daily with warm water and mild soap. Do not use hot water. Then, pat your feet and the areas between your toes until they are completely dry. Do not soak your feet as this can dry your skin.  Trim your toenails straight across. Do not dig under them or around the cuticle. File the edges of your nails with an emery board or nail file.  Apply a moisturizing lotion or petroleum jelly to the skin on your feet and to dry, brittle toenails. Use lotion that does not contain alcohol and is unscented. Do not apply lotion between your toes. Shoes and socks  Wear clean socks or stockings every day. Make sure they are not too tight. Do not wear knee-high stockings since they may decrease blood flow to your legs.  Wear shoes that fit properly and have enough cushioning. Always look in your shoes before you put them on to be sure there are no objects inside.  To break in new shoes, wear them for just a few hours a day. This prevents injuries on your feet. Wounds, scrapes, corns, and calluses  Check your feet daily for blisters, cuts, bruises, sores, and redness. If you cannot see the bottom of your feet, use a mirror or ask someone for help.  Do not cut corns or calluses or try to remove them with medicine.  If you  find a minor scrape, cut, or break in the skin on your feet, keep it and the skin around it clean and dry. You may clean these areas with mild soap and water. Do not clean the area with peroxide, alcohol, or iodine.  If you have a wound, scrape, corn, or callus on your foot, look at it several times a day to make sure it is healing and not infected. Check for: ? Redness, swelling, or pain. ? Fluid or blood. ? Warmth. ? Pus or a bad smell. General instructions  Do not cross your legs. This may decrease blood flow to your feet.  Do not use heating pads or hot water bottles on your feet. They may burn your skin. If you have lost feeling in your feet or legs, you may not know this is happening until it is too late.  Protect your feet from hot and cold by wearing shoes, such as at the beach or on hot pavement.  Schedule a complete foot exam at least once a year (annually) or more often if you have foot problems. If you have foot problems, report any cuts, sores, or bruises to your health care provider immediately. Contact a health care provider if:  You have a medical condition that increases your risk of infection and you have any cuts, sores, or bruises on your feet.  You have an injury that is not   healing.  You have redness on your legs or feet.  You feel burning or tingling in your legs or feet.  You have pain or cramps in your legs and feet.  Your legs or feet are numb.  Your feet always feel cold.  You have pain around a toenail. Get help right away if:  You have a wound, scrape, corn, or callus on your foot and: ? You have pain, swelling, or redness that gets worse. ? You have fluid or blood coming from the wound, scrape, corn, or callus. ? Your wound, scrape, corn, or callus feels warm to the touch. ? You have pus or a bad smell coming from the wound, scrape, corn, or callus. ? You have a fever. ? You have a red line going up your leg. Summary  Check your feet every day  for cuts, sores, red spots, swelling, and blisters.  Moisturize feet and legs daily.  Wear shoes that fit properly and have enough cushioning.  If you have foot problems, report any cuts, sores, or bruises to your health care provider immediately.  Schedule a complete foot exam at least once a year (annually) or more often if you have foot problems. This information is not intended to replace advice given to you by your health care provider. Make sure you discuss any questions you have with your health care provider. Document Released: 11/23/2000 Document Revised: 01/08/2018 Document Reviewed: 12/28/2016 Elsevier Patient Education  2020 Elsevier Inc.  

## 2019-11-03 ENCOUNTER — Other Ambulatory Visit: Payer: Self-pay

## 2019-11-03 ENCOUNTER — Ambulatory Visit (INDEPENDENT_AMBULATORY_CARE_PROVIDER_SITE_OTHER): Payer: PPO | Admitting: Pharmacist

## 2019-11-03 DIAGNOSIS — E119 Type 2 diabetes mellitus without complications: Secondary | ICD-10-CM

## 2019-11-03 DIAGNOSIS — I10 Essential (primary) hypertension: Secondary | ICD-10-CM | POA: Diagnosis not present

## 2019-11-03 DIAGNOSIS — Z20822 Contact with and (suspected) exposure to covid-19: Secondary | ICD-10-CM

## 2019-11-04 ENCOUNTER — Other Ambulatory Visit: Payer: Self-pay

## 2019-11-04 DIAGNOSIS — Z20822 Contact with and (suspected) exposure to covid-19: Secondary | ICD-10-CM

## 2019-11-05 LAB — NOVEL CORONAVIRUS, NAA: SARS-CoV-2, NAA: NOT DETECTED

## 2019-11-06 LAB — NOVEL CORONAVIRUS, NAA: SARS-CoV-2, NAA: NOT DETECTED

## 2019-11-06 NOTE — Patient Instructions (Signed)
Visit Information  Goals Addressed            This Visit's Progress     Patient Stated   . I would like to continue to control my BGs and keep my A1c<7% (pt-stated)       Current Barriers:   Patient's current BG meter is malfunctioning-->requested new meter (completed)  A1C 7.5 (05/14/19)   Pharmacist Clinical Goal(s):   Over the next 14 days, patient will obtain free BG meter through insurance: FreeStyle, Precision or One Touch meters are preferred through [facility] Completed  Over the next 30 days, patient will obtain new glucometer to be able to check & maintain BG with help from husband & [place]. Goal Not Met   06/15/19: Over the next 30 days, patient will have received a new glucometer from [place] and will be able to comfortably self monitor her CBG's GOAL NOT MET  06/15/19: Over the next 60 days, patient will have a running log of her BG readings, measuring and recording her readings at least once daily. GOAL NOT MET  New - 10/06/19  Over the next 30 days, patient will work with embedded Pharm D [name] on how to use her glucometer   CCM PharmD Interventions: 11/03/19 clinic visit with patient  Discussed plans with patient for ongoing care management follow up and provided patient with direct contact information for care management team  Patient has received new glucometer, however she has been unable to follow up and come to clinic for glucometer training.  Encouraged patient to come to clinic on Monday or Wednesday to set up glucometer.  A1c has improved to 7.1% on 10/07/19, therefore patient appears to be compliant with Jardiance and diet/lifestyle.  Jardiance 93m samples left up at front desk for patient to pick up.  She is on her last bottle which would be correct given last month samples for patient    Encouraged patient to call for medication support as needed   CCM RN CM Interventions:  10/02/19 call completed with patient   . Evaluation of  current treatment plan related to Diabetes and patient's adherence to plan as established by provider. . Provided education to patient re: A1C is up to 7.5 from 7.0 obtained on 05/14/19; discussed the target goal for A1C <7.0; reinforced importance of adhering to prescribed DM treatment plan including taking all diabetic medications exactly as prescribed, monitor FBS and follow established protocol for hypo/hyperglycemic episodes, implementing exercise regularly into daily routine, at least 150 minutes weekly, adhere to strictly following a diabetic friendly diet with Meal planning using the plate method; reinforced the importance of lowering A1C in order to help prevent potential complications that may occur as a result of having uncontrolled DM . Reviewed medications with patient and discussed that patient continues to use her pill package system and reports she is taking all medications exactly as prescribed w/o missed doses  . Collaborated with embedded Pharm D JLottie Dawson regarding patient is not checking her CBG's due to still not feeling comfortable using her glucometer; advised patient agreed to bring her glucometer into the office during her OV on 10/07/19 in order for JAlmyra Freeto instruct patient and her spouse on how to use the glucometer . Discussed plans with patient for ongoing care management follow up and provided patient with direct contact information for care management team . Advised patient, providing education and rationale, to check cbg daily before meals and record, calling the CCM team and or  PCP provider for findings outside established parameters.  <80 and or >250   Please see past updates related to this goal by clicking on the "Past Updates" button in the selected goal         The patient verbalized understanding of instructions provided today and declined a print copy of patient instruction materials.   The care management team will reach out to the patient again over the  next 30 days.   SIGNATURE Regina Eck, PharmD, BCPS Clinical Pharmacist, [facility] [place]: [phone]

## 2019-11-06 NOTE — Progress Notes (Signed)
  Chronic Care Management   Outreach Note  11/02/2019 Name: KARESA SITZER MRN: [id number] DOB: 03-Jul-1951  Referred by: [name], FNP Reason for referral : Chronic Care Management  Successful call to patient, however she was currently at another doctor's appt.  She requested I call back tomorrow.   Follow Up Plan: The care management team will reach out to the patient again over the next day.  SIGNATURE Regina Eck, PharmD, BCPS Clinical Pharmacist, [facility] [place]: [phone]

## 2019-11-06 NOTE — Progress Notes (Signed)
Chronic Care Management    Visit Note  11/03/2019 Name: Bridget Good MRN: [id number] DOB: [birth year]  Referred by: Bridget Brine, FNP Reason for referral : Chronic Care Management   Bridget Good is a 68 y.o. year old female who is a primary care patient of Bridget Good, [place]. The CCM team was consulted for assistance with chronic disease management and care coordination needs related to DMII  Review of patient status, including review of consultants reports, relevant laboratory and other test results, and collaboration with appropriate care team members and the patient's provider was performed as part of comprehensive patient evaluation and provision of chronic care management services.    I spoke with Bridget Good by telephone today  Medications: Outpatient Encounter Medications as of 11/03/2019  Medication Sig  . acetaminophen (TYLENOL) 500 MG tablet Take 1 tablet (500 mg total) by mouth as needed for mild pain.  Marland Kitchen amLODipine-benazepril (LOTREL) 10-40 MG capsule TAKE 1 CAPSULE BY MOUTH DAILY.(AM)  . aspirin EC 81 MG tablet Take 1 tablet (81 mg total) by mouth daily.  . blood glucose meter kit and supplies Dispense based on patient and insurance preference. Use up to four times daily as directed. (FOR ICD-10 E10.9, E11.9).  Marland Kitchen Blood Glucose Monitoring Suppl (FREESTYLE LITE) DEVI Check blood sugars twice daily  . ciclopirox (PENLAC) 8 % solution Apply topically at bedtime. Apply over nail and surrounding skin. Apply daily over previous coat. Remove weekly with polish remover. (Patient taking differently: Apply 1 application topically at bedtime. Apply over nail and surrounding skin. Apply daily over previous coat. Remove weekly with polish remover.)  . clopidogrel (PLAVIX) 75 MG tablet TAKE ONE TABLET BY MOUTH ONCE DAILY (AM)  . clotrimazole-betamethasone (LOTRISONE) cream Apply to both feet and between toes bid x 4 weeks. (Patient not taking: Reported on 10/07/2019)  . diclofenac  sodium (VOLTAREN) 1 % GEL Apply 2 g topically 4 (four) times daily.  . empagliflozin (JARDIANCE) 10 MG TABS tablet Take 10 mg by mouth daily.  Marland Kitchen escitalopram (LEXAPRO) 5 MG tablet Take 1 tablet (5 mg total) by mouth at bedtime.  . fluticasone (FLONASE) 50 MCG/ACT nasal spray Place into both nostrils daily.  Bridget Good Jelly-Vit B12 (GINSENG ROYAL JELLY PLUS) 375-1-15 MG-MG-MCG CAPS Take 1 capsule by mouth daily.  Marland Kitchen glucose blood test strip Use as instructed  . glucose blood test strip Test blood sugars up to 4 times a day  . Lancets (FREESTYLE) lancets Use as instructed  . metoprolol succinate (TOPROL-XL) 50 MG 24 hr tablet TAKE 1 TABLET BY MOUTH DAILY. TAKE WITH OR IMMEDIATELY FOLLOWING A MEAL (AM)  . nitroGLYCERIN (NITROSTAT) 0.4 MG SL tablet Place 1 tablet (0.4 mg total) under the tongue every 5 (five) minutes as needed for chest pain.  . NONFORMULARY OR COMPOUNDED ITEM Antifungal solution: Terbinafine 3%, Fluconazole 2%, Tea Tree Oil 5%, Urea 10%, Ibuprofen 2% in DMSO suspension #26m  . pantoprazole (PROTONIX) 40 MG tablet Take one tablet by mouth daily in AM  . potassium chloride SA (KLOR-CON) 20 MEQ tablet TAKE ONE TABLET BY MOUTH ONCE DAILY (AM)  . rosuvastatin (CRESTOR) 20 MG tablet TAKE ONE TABLET BY MOUTH ONCE DAILY (BEDTIME)  . Vitamin D, Ergocalciferol, (DRISDOL) 1.25 MG (50000 UT) CAPS capsule TAKE 1 CAPSULE TWICE A WEEK ( MONDAY & THURSDAY)   No facility-administered encounter medications on file as of 11/03/2019.      Objective:   Goals Addressed  This Visit's Progress     Patient Stated   . I would like to continue to control my BGs and keep my A1c<7% (pt-stated)       Current Barriers:   Patient's current BG meter is malfunctioning-->requested new meter (completed)  A1C 7.5 (05/14/19)   Pharmacist Clinical Goal(s):   Over the next 14 days, patient will obtain free BG meter through insurance: FreeStyle, Precision or One Touch meters are preferred  through [facility] Completed  Over the next 30 days, patient will obtain new glucometer to be able to check & maintain BG with help from husband & [place]. Goal Not Met   06/15/19: Over the next 30 days, patient will have received a new glucometer from [place] and will be able to comfortably self monitor her CBG's GOAL NOT MET  06/15/19: Over the next 60 days, patient will have a running log of her BG readings, measuring and recording her readings at least once daily. GOAL NOT MET  New - 10/06/19  Over the next 30 days, patient will work with embedded Pharm D [name] on how to use her glucometer   CCM PharmD Interventions: 11/03/19 clinic visit with patient  Discussed plans with patient for ongoing care management follow up and provided patient with direct contact information for care management team  Patient has received new glucometer, however she has been unable to follow up and come to clinic for glucometer training.  Encouraged patient to come to clinic on Monday or Wednesday to set up glucometer.  A1c has improved to 7.1% on 10/07/19, therefore patient appears to be compliant with Jardiance and diet/lifestyle.  Jardiance 22m samples left up at front desk for patient to pick up.  She is on her last bottle which would be correct given last month samples for patient    Encouraged patient to call for medication support as needed   CCM RN CM Interventions:  10/02/19 call completed with patient   . Evaluation of current treatment plan related to Diabetes and patient's adherence to plan as established by provider. . Provided education to patient re: A1C is up to 7.5 from 7.0 obtained on 05/14/19; discussed the target goal for A1C <7.0; reinforced importance of adhering to prescribed DM treatment plan including taking all diabetic medications exactly as prescribed, monitor FBS and follow established protocol for hypo/hyperglycemic episodes, implementing exercise regularly into  daily routine, at least 150 minutes weekly, adhere to strictly following a diabetic friendly diet with Meal planning using the plate method; reinforced the importance of lowering A1C in order to help prevent potential complications that may occur as a result of having uncontrolled DM . Reviewed medications with patient and discussed that patient continues to use her pill package system and reports she is taking all medications exactly as prescribed w/o missed doses  . Collaborated with embedded Pharm D JLottie Dawson regarding patient is not checking her CBG's due to still not feeling comfortable using her glucometer; advised patient agreed to bring her glucometer into the office during her OV on 10/07/19 in order for JAlmyra Freeto instruct patient and her spouse on how to use the glucometer . Discussed plans with patient for ongoing care management follow up and provided patient with direct contact information for care management team . Advised patient, providing education and rationale, to check cbg daily before meals and record, calling the CCM team and or PCP provider for findings outside established parameters.  <80 and or >250   Please  see past updates related to this goal by clicking on the "Past Updates" button in the selected goal          Plan:   The care management team will reach out to the patient again over the next 30 days.   Provider Signature Regina Eck, PharmD, BCPS Clinical Pharmacist, [facility] [place]: [phone]

## 2019-11-07 NOTE — Progress Notes (Signed)
Subjective: Bridget Good is seen today for follow up painful, elongated, thickened toenails bilateral feet that she cannot cut. Pain interferes with daily activities. Aggravating factor includes wearing enclosed shoe gear and relieved with periodic debridement.  She would like to discuss treatment for her mycotic toenails.  Medications reviewed in chart.  Allergies  Allergen Reactions  . Sulfa Antibiotics Hives, Itching and Nausea And Vomiting    Objective:  Vascular Examination: Capillary refill time immediate b/l.  Dorsalis pedis present b/l.  Posterior tibial pulses present b/l.  Digital hair absent b/l.   Skin temperature gradient WNL b/l.   Dermatological Examination: Skin with normal turgor, texture and tone b/l.  Toenails 1-5 right foot and left great toe discolored, thick, dystrophic with subungual debris and pain with palpation to nailbeds due to thickness of nails.  Toenails 2-5 left foot elongated and nondystrophic.   Musculoskeletal: Muscle strength 5/5 to all LE muscle groups b/l.  No gross bony deformities b/l.  No pain, crepitus or joint limitation noted with ROM.   Neurological Examination: Protective sensation intact 5/5 b/l with 10 gram monofilament..  Assessment: Painful onychomycosis toenails 1-5 right foot and left great toe   Plan: 1. Toenails 1-5 right foot and left great toe were debrided in length and girth without iatrogenic bleeding. Nondystrophic toenails 2-5 left footl were trimmed. Discussed topical, laser and oral medication. Patient opted for topical treatment with compounded medication. Rx written for nonformulary compounding topical antifungal: [facility]: Antifungal cream - Terbinafine 3%, Fluconazole 2%, Tea Tree Oil 5%, Urea 10%, Ibuprofen 2% in DMSO Suspension #48ml. Apply to the affected nail(s) at bedtime. 2. Patient to continue soft, supportive shoe gear daily. 3. Patient to report any pedal injuries to medical  professional immediately. 4. Follow up 3 months.  5. Patient/POA to call should there be a concern in the interim.

## 2019-11-12 ENCOUNTER — Telehealth: Payer: Self-pay

## 2019-11-12 ENCOUNTER — Other Ambulatory Visit: Payer: Self-pay | Admitting: Nurse Practitioner

## 2019-11-12 DIAGNOSIS — F329 Major depressive disorder, single episode, unspecified: Secondary | ICD-10-CM

## 2019-11-12 DIAGNOSIS — F419 Anxiety disorder, unspecified: Secondary | ICD-10-CM

## 2019-11-12 DIAGNOSIS — F32A Depression, unspecified: Secondary | ICD-10-CM

## 2019-11-12 NOTE — Telephone Encounter (Signed)
Patient called stating she has a sore throat,runny nose and some drainage and It has been 2 weeks since her symptoms have started.  I RETURNED PT CALL AND LEFT V/M FOR HER TO CALL THE OFFICE SO WE CAN SCHEDULE A VIRTUAL APPOINTMENT. Bridget Good

## 2019-11-25 ENCOUNTER — Ambulatory Visit: Payer: PPO

## 2019-11-25 ENCOUNTER — Ambulatory Visit: Payer: Self-pay | Admitting: Pharmacist

## 2019-11-25 ENCOUNTER — Ambulatory Visit: Payer: PPO | Admitting: Nurse Practitioner

## 2019-11-26 NOTE — Progress Notes (Signed)
  Chronic Care Management   Outreach Note  11/25/2019 Name: Bridget Good MRN: [id number] DOB: 10-Sep-1951  Referred by: [name], FNP Reason for referral : No chief complaint on file.   An unsuccessful telephone outreach was attempted today. The patient was referred to the case management team by for assistance with care management and care coordination.   Follow Up Plan: A HIPPA compliant phone message was left for the patient providing contact information and requesting a return call.  The care management team will reach out to the patient again over the next 7-10 days.   SIGNATURE Regina Eck, PharmD, BCPS Clinical Pharmacist, [facility] [place]: [phone]

## 2019-12-02 ENCOUNTER — Ambulatory Visit: Payer: Self-pay | Admitting: Pharmacist

## 2019-12-02 ENCOUNTER — Other Ambulatory Visit: Payer: Self-pay | Admitting: Nurse Practitioner

## 2019-12-02 NOTE — Progress Notes (Signed)
  Chronic Care Management   Outreach Note  12/02/2019 Name: Bridget Good MRN: [id number] DOB: [birth year]  Referred by: [name], FNP Reason for referral : Chronic Care Management   An unsuccessful telephone outreach was attempted today. The patient was referred to the case management team by for assistance with care management and care coordination.   Follow Up Plan: The care management team will reach out to the patient again over the next 30 days.  Voicemail box was full and unable to accept messages at this time.  SIGNATURE Regina Eck, PharmD, BCPS Clinical Pharmacist, [facility] [place]: [phone]

## 2019-12-10 ENCOUNTER — Other Ambulatory Visit: Payer: Self-pay

## 2019-12-10 DIAGNOSIS — Z20822 Contact with and (suspected) exposure to covid-19: Secondary | ICD-10-CM

## 2019-12-10 DIAGNOSIS — Z20828 Contact with and (suspected) exposure to other viral communicable diseases: Secondary | ICD-10-CM | POA: Diagnosis not present

## 2019-12-12 LAB — NOVEL CORONAVIRUS, NAA: SARS-CoV-2, NAA: NOT DETECTED

## 2019-12-14 ENCOUNTER — Telehealth: Payer: Self-pay

## 2019-12-16 ENCOUNTER — Telehealth: Payer: Self-pay

## 2019-12-16 ENCOUNTER — Ambulatory Visit: Payer: Self-pay

## 2019-12-16 ENCOUNTER — Telehealth: Payer: Self-pay | Admitting: Neurology

## 2019-12-16 DIAGNOSIS — I10 Essential (primary) hypertension: Secondary | ICD-10-CM

## 2019-12-16 DIAGNOSIS — G3184 Mild cognitive impairment, so stated: Secondary | ICD-10-CM

## 2019-12-16 DIAGNOSIS — E119 Type 2 diabetes mellitus without complications: Secondary | ICD-10-CM

## 2019-12-16 DIAGNOSIS — G9689 Other specified disorders of central nervous system: Secondary | ICD-10-CM

## 2019-12-16 NOTE — Telephone Encounter (Signed)
Called the patient. Her husband sees Dr Rexene Alberts. I was able to get him in with NP for tomorrow at 9 am. Advised that he would need to check in around 8:30 am.

## 2019-12-16 NOTE — Chronic Care Management (AMB) (Signed)
  Chronic Care Management   Outreach Note  12/16/2019 Name: Bridget Good MRN: [id number] DOB: 11/21/1951  Referred by: [name], FNP Reason for referral : Chronic Care Management (CCM RNCM Telephone Follow up )   An unsuccessful telephone outreach was attempted today. The patient was referred to the case management team by [name] FNP for assistance with care management and care coordination.   Follow Up Plan: Telephone follow up appointment with care management team member scheduled for: 01/01/20  Barb Merino, RN, BSN, CCM Care Management Coordinator [facility]  Direct Phone: [phone]

## 2019-12-16 NOTE — Telephone Encounter (Signed)
Pt called wanting to know if her husband can be seen at the same time she is being seen. They would like to speak to RN. Please advise.

## 2019-12-17 ENCOUNTER — Ambulatory Visit (INDEPENDENT_AMBULATORY_CARE_PROVIDER_SITE_OTHER): Payer: PPO | Admitting: Neurology

## 2019-12-17 ENCOUNTER — Encounter: Payer: Self-pay | Admitting: Neurology

## 2019-12-17 ENCOUNTER — Other Ambulatory Visit: Payer: Self-pay

## 2019-12-17 VITALS — Temp 97.5°F | Ht 63.0 in | Wt 196.0 lb

## 2019-12-17 DIAGNOSIS — R0683 Snoring: Secondary | ICD-10-CM

## 2019-12-17 DIAGNOSIS — F32A Depression, unspecified: Secondary | ICD-10-CM

## 2019-12-17 DIAGNOSIS — F5104 Psychophysiologic insomnia: Secondary | ICD-10-CM | POA: Diagnosis not present

## 2019-12-17 DIAGNOSIS — F329 Major depressive disorder, single episode, unspecified: Secondary | ICD-10-CM | POA: Diagnosis not present

## 2019-12-17 NOTE — Patient Instructions (Signed)

## 2019-12-17 NOTE — Progress Notes (Signed)
SLEEP MEDICINE CLINIC   Provider:  Larey Seat, MD   Primary Care Physician:  [name], FNP Referring Provider: [name], FNP   Chief Complaint  Patient presents with  . New Patient (Initial Visit)    pt with husband,rm 10.  can't sleep well at night, noted more memory problems and feels irritable from not sleeping. She snores and wakes up gasping for air.     HPI: Bridget Good. Bridget Good is a 69 year old right-handed African-American female patient seen here in the presence of her husband.  She used to see Dr. Wilson Singer for many years until he retired and has now established primary care with tried another medicine.  Her past medical history is positive for hypertension, hyperlipidemia and diabetes mellitus.  She also has complaints about anxiety headaches and recently developed less restorative sleep and problems with insomnia. She feels under a lot of stress. Her husband is retired- the couple now runs a Animal nutritionist business. Her husband had resigned after 25 years from a church, after a conflict with the leadership.     Bridget Good is a 69 y.o. female patient, seen today on follow up on her sleep study and memory concerns. 12-17-2019.  For this visit, she underwent a Mini-Mental status today with exam short 25 out of 30 points the patient is a made a little error with the drawing of a clock face but corrected herself she was able to name 11 animals and her drawing was Good.  She wrote a sentence she was able to spell backwards but did not do well with calculation, I would like to add that recall was impaired which tells me that her short-term memory is her main problem.  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 She was able to do the serial 7 last time. Sleep has improved on melatonin.      CM 06-16-2019 , The polysomnography was performed on 29 July 2018, and the patient did not have any significant sleep apnea at the time nor periodic limb movements.   She did normal EKG 2 there was some sinus bradycardia but no tachycardia.  She slept over 6 hours without fragmentation of sleep.  The only recommendation I had at the time was to avoid sleeping in supine as snoring was louder in that position and may interrupt her fragmented sleep.  At least during the sleep lab stay there was no fragmentation.  I wanted to make sure that I recap her apnea hypopnea index it was 4.4 and her REM sleep AHI was 12.6.  We meet today because the patient has been rereferred for concerns of cognitive function or dysfunction.  My nurse performed a Mini-Mental status test today with Mrs. Bridget Good and she scored 24 out of 30 points which is lower than expected.  It was really is a short-term memory that is most affected orientation abstraction and following multitasks looked fine.  She was also well able to do attention and calculation.  She had developed some daytime sleepiness, was ' groggy' on melatonin. I am not sure which dose she took.   her daughter brought her her : " Mother os more forgetful, frustrated, simple tasks, misplacing items"  The patient is worked up for sarcoidosis, lupus, and was cleared of cancer. Her husband had retired also to take care of her and became depressed, grumpy.  That seems to became an issue in quarantine times, cooped up together   she never filled or took  the ZOLOFT I recommended.   MMSE - Mini Mental State Exam 12/17/2019 06/16/2019  Orientation to time 4 4  Orientation to Place 4 5  Registration 3 3  Attention/ Calculation 5 5  Recall 1 0  Language- name 2 objects 2 2  Language- repeat 1 1  Language- follow 3 step command 3 2  Language- read & follow direction 0 0  Write a sentence 1 1  Copy design 1 1  Total score 25 24    How likely are you to doze in the following situations: 0 = not likely, 1 = slight chance, 2 = moderate chance, 3 = high chance  Sitting and Reading?3 Watching Television? 2 Sitting inactive in a public place  (theater or meeting)?0 As a passenger in a car for an hour without a break?0 Lying down in the afternoon when circumstances permit?2 Sitting and talking to someone?0 Sitting quietly after lunch without alcohol?1-2 In a car, while stopped for a few minutes in traffic?0   Total = 8 today on 12-17-2019.  (17 points on 06-16-2019).   She was last seen in 2019 for a sleep evaluation. She has had a a turbulent year. Patient has been seen by oncology/ hematology. She has a stroke in 07-12-2018. She was at home in bed, got up to the bathroom and had diplopia. She went to hospital and was diagnosed with a stroke.  As I had explained last year there were a lot of social and psychological pressures on the [place] couple, and I had felt that her sleep may have partially been impaired by this. However she had seen the oncologist Dr. Zoila Shutter, who stated on 11/27/2018 that the patient had a bone lesion noted on an MRI in August 2019 with a stroke history about being on Plavix. An SPEP was negative, the infarct of the abnormal marrow signal of the clivus was compared to a study from 2016 and not found.The oncologist worked her up for possible myeloma but did not find evidence for this. Oncology also set the patient up for skeletal survey which has not been yet done.   She also wanted to refer her again to neurology for evaluation and follow-up, but there is no need for stroke follow-up at this time.  The patient already had an evaluation in the sleep clinic. She was then seen by oncologist Cecil Cobbs at the time presented with very high blood pressures 198/115 in summary Dr. Mickeal Skinner: referral to Dr. Venetia Constable for Biopsy.   She was later referred to me by PCP and Dr. Jaynee Eagles, MD for a sleep study after stroke:   IMPRESSION of her sleep study :  1. No clinically significant degree of Obstructive Sleep Apnea (OSA), Periodic Limb Movement Disorder (PLMD) and only mild- moderate Snoring. No hypoxemia.  2. Normal EKG  rhythm with some sinus bradycardia ( not on beta blocker).  3. Over 6 hours of sleep time without fragmentation.  4. Early REM onset at less than 30 minutes was unusual. (There are no other symptoms of narcolepsy endorsed, and Epworth score is less than 14 / 24, fatigue is not high).   Larey Seat, MD     08-04-2018    Sleep habits are as follows:  Dinner time is around 6 -7 Pm, and working through books and fianances of there company, often watching TV. Sometimes she sleeps on the sofa.   Bed time is around 11.30 after the late news. She takes night time medication- Tylenol or ibuprofen.  The bedroom is relatively quiet, dark and cool. She sleeps on her back and on 2 pillows, she snores, intermittently. Nocturia once at the most.  Cannot recall dreaming at all. She wakes spontaneously between 7 and 8 and rises. Her knee is stiff.  Some morning she wakes with headaches, not from headaches.  Rare naps. Less than 7 hours of nocturnal sleep.    Sleep medical history and family sleep history: NP moore has ordered a rheumatological panel, RA factor was positive at 15.6 u/ml. Vit D deficiency. Chol 211, creatinine above 1.    Social history:  Non smoker, non drinker, caffeine use - iced tea and sodas- up to 2 glasses a day.   Review of Systems: Out of a complete 14 system review, the patient complains of only the following symptoms, and all other reviewed systems are negative.  Insomnia, snoring. Depression.    Social History   Socioeconomic History  . Marital status: Married    Spouse name: Not on file  . Number of children: 2  . Years of education: Not on file  . Highest education level: Not on file  Occupational History  . Occupation: retired  Tobacco Use  . Smoking status: Never Smoker  . Smokeless tobacco: Never Used  Substance and Sexual Activity  . Alcohol use: No    Alcohol/week: 0.0 standard drinks  . Drug use: No  . Sexual activity: Yes    Partners: Male    Birth  control/protection: Surgical  Other Topics Concern  . Not on file  Social History Narrative   Married.  Husband is psychotherapist.  Several children.  Husband is Theme park manager of [employer] in [place].   Social Determinants of Health   Financial Resource Strain: Medium Risk  . Difficulty of Paying Living Expenses: Somewhat hard  Food Insecurity: Food Insecurity Present  . Worried About Charity fundraiser in the Last Year: Sometimes true  . Ran Out of Food in the Last Year: Never true  Transportation Needs: No Transportation Needs  . Lack of Transportation (Medical): No  . Lack of Transportation (Non-Medical): No  Physical Activity:   . Days of Exercise per Week: Not on file  . Minutes of Exercise per Session: Not on file  Stress: Stress Concern Present  . Feeling of Stress : Rather much  Social Connections: Not Isolated  . Frequency of Communication with Friends and Family: More than three times a week  . Frequency of Social Gatherings with Friends and Family: More than three times a week  . Attends Religious Services: More than 4 times per year  . Active Member of Clubs or Organizations: Yes  . Attends Archivist Meetings: More than 4 times per year  . Marital Status: Married  Human resources officer Violence: Not At Risk  . Fear of Current or Ex-Partner: No  . Emotionally Abused: No  . Physically Abused: No  . Sexually Abused: No    Family History  Problem Relation Age of Onset  . Heart attack Father   . Diabetes Father   . Hypertension Mother   . Heart attack Brother   . Kidney failure Brother   . Diabetes Sister   . Diabetes Sister     Past Medical History:  Diagnosis Date  . Angina   . Anxiety   . Bronchitis   . Depression   . Diabetes mellitus   . Fibromyalgia   . GERD (gastroesophageal reflux disease)   . Headache(784.0)   . Hyperlipidemia   .  Hypertension   . Hypertensive heart disease without CHF   . Lupus ([place])    "treated for it from 1992 til  2012; dr said I don't have it anymore"  . Obesity (BMI 30-39.9)   . Osteoarthritis   . Pneumonia   . Shortness of breath    lying down, upon exertion  . Shortness of breath on exertion   . Stroke [facility])    2019    Past Surgical History:  Procedure Laterality Date  . ABDOMINAL HYSTERECTOMY     partial  . CARDIAC CATHETERIZATION  ~ 2007  . CESAREAN SECTION  1978; 1981  . COLONOSCOPY    . CRANIOTOMY N/A 02/27/2019   Procedure: Endonasal Endoscopic biopsy of clival mass;  Surgeon: Judith Part, MD;  Location: [facility];  Service: Neurosurgery;  Laterality: N/A;  Endonasal Endoscopic biopsy of clival mass  . ENDOSCOPIC TRANS NASAL APPROACH WITH FUSION N/A 02/27/2019   Procedure: ENDOSCOPIC TRANS NASAL APPROACH WITH FUSION;  Surgeon: Judith Part, MD;  Location: [facility];  Service: Neurosurgery;  Laterality: N/A;  ENDOSCOPIC TRANS NASAL APPROACH WITH FUSION  . FOOT SURGERY     "had to cut it to let the fluids out; it had swollen very badly; left foot"    Current Outpatient Medications  Medication Sig Dispense Refill  . acetaminophen (TYLENOL) 500 MG tablet Take 1 tablet (500 mg total) by mouth as needed for mild pain. 90 tablet 1  . amLODipine-benazepril (LOTREL) 10-40 MG capsule TAKE 1 CAPSULE BY MOUTH DAILY.(AM) 90 capsule 1  . aspirin EC 81 MG tablet One tablet by mouth daily 90 tablet 1  . blood glucose meter kit and supplies Dispense based on patient and insurance preference. Use up to four times daily as directed. (FOR ICD-10 E10.9, E11.9). 1 each 0  . Blood Glucose Monitoring Suppl (FREESTYLE LITE) DEVI Check blood sugars twice daily 1 each 1  . ciclopirox (PENLAC) 8 % solution Apply topically at bedtime. Apply over nail and surrounding skin. Apply daily over previous coat. Remove weekly with polish remover. (Patient taking differently: Apply 1 application topically at bedtime. Apply over nail and surrounding skin. Apply daily over previous coat. Remove weekly with polish  remover.) 6.6 mL 11  . clopidogrel (PLAVIX) 75 MG tablet TAKE ONE TABLET BY MOUTH ONCE DAILY (AM) 90 tablet 1  . clotrimazole-betamethasone (LOTRISONE) cream Apply to both feet and between toes bid x 4 weeks. 45 g 1  . diclofenac sodium (VOLTAREN) 1 % GEL Apply 2 g topically 4 (four) times daily. 100 g 2  . empagliflozin (JARDIANCE) 10 MG TABS tablet Take 10 mg by mouth daily. 90 tablet 3  . escitalopram (LEXAPRO) 5 MG tablet TAKE ONE TABLET BY MOUTH DAILY AT BEDTIME. 90 tablet 0  . fluticasone (FLONASE) 50 MCG/ACT nasal spray Place into both nostrils daily.    [name] Jelly-Vit B12 (GINSENG ROYAL JELLY PLUS) 375-1-15 MG-MG-MCG CAPS Take 1 capsule by mouth daily. 90 capsule 1  . glucose blood test strip Use as instructed 100 each 12  . glucose blood test strip Test blood sugars up to 4 times a day 100 each 12  . Lancets (FREESTYLE) lancets Use as instructed 100 each 12  . metoprolol succinate (TOPROL-XL) 50 MG 24 hr tablet TAKE 1 TABLET BY MOUTH DAILY. TAKE WITH OR IMMEDIATELY FOLLOWING A MEAL (AM) 90 tablet 1  . nitroGLYCERIN (NITROSTAT) 0.4 MG SL tablet Place 1 tablet (0.4 mg total) under the tongue every 5 (five) minutes as  needed for chest pain. 25 tablet 12  . NONFORMULARY OR COMPOUNDED ITEM Antifungal solution: Terbinafine 3%, Fluconazole 2%, Tea Tree Oil 5%, Urea 10%, Ibuprofen 2% in DMSO suspension #26m 1 each 3  . pantoprazole (PROTONIX) 40 MG tablet Take one tablet by mouth daily in AM 90 tablet 1  . potassium chloride SA (KLOR-CON) 20 MEQ tablet TAKE ONE TABLET BY MOUTH ONCE DAILY (AM) 90 tablet 1  . rosuvastatin (CRESTOR) 20 MG tablet TAKE ONE TABLET BY MOUTH ONCE DAILY (BEDTIME) 90 tablet 1  . Vitamin D, Ergocalciferol, (DRISDOL) 1.25 MG (50000 UT) CAPS capsule TAKE 1 CAPSULE TWICE A WEEK ( MONDAY & THURSDAY) 24 capsule 1   No current facility-administered medications for this visit.    Allergies as of 12/17/2019 - Review Complete 12/17/2019  Allergen Reaction Noted  .  Sulfa antibiotics Hives, Itching, and Nausea And Vomiting 12/30/2011    Vitals: Temp (!) 97.5 F (36.4 C)   Ht '5\' 3"'  (1.6 m)   Wt 196 lb (88.9 kg)   BMI 34.72 kg/m  Last Weight:  Wt Readings from Last 1 Encounters:  12/17/19 196 lb (88.9 kg)   BZES:PQZRmass index is 34.72 kg/m.     Last Height:   Ht Readings from Last 1 Encounters:  12/17/19 '5\' 3"'  (1.6 m)    Physical exam:  General: The patient is awake, alert and appears not in acute distress. The patient is well groomed. Head: Normocephalic, atraumatic. Neck is supple. Mallampati 4,  neck circumference:14. Nasal airflow patent ,Retrognathia is strongly seen.  Cardiovascular:  Regular rate and rhythm, without  murmurs or carotid bruit, and without distended neck veins. Respiratory: Lungs are clear to auscultation. Skin:  Without evidence of edema, or rash Trunk: BMI is 28.3. The patient's posture is erect   Neurologic exam : The patient is awake and alert, oriented to place and time.   Memory subjective described as intact.  Attention span & concentration ability appears normal.  Speech is fluent, without dysarthria, dysphonia or aphasia.  Mood and affect are depressed, sad, worried. .  Cranial nerves: no loss of smell or taste.  Pupils are equal and briskly reactive to light.  Extraocular movements intact and without nystagmus. Visual fields by finger perimetry are intact. Hearing to finger rub intact.  Facial motor strength is symmetric, the tongue and uvula move midline. Shoulder shrug was symmetrical.   Motor exam:  Normal tone, muscle bulk and symmetric strength in all extremities. Weak grip- bilaterally- and weak pinch. .  Sensory:   Normal to vibration and temperature. Coordination: handwriting changes. Finger-to-nose maneuver  normal without evidence of ataxia, dysmetria or tremor. Gait and station: Patient walks with a cane assistive device.  She has left knee arthritis. Turns with 3 Steps.   Deep tendon  reflexes: in the upper and lower extremities are 1 plus..  Assessment:   1) insomnia was most likely stress- related -  She sleeps deeper and longer on melatonin, is unsure about the dose -  2) snoring- and hypersomnia- consider a dental device.  3) depression, sadness, likely pseudodementia. MMSE today 25/ 30 - sjhe feels stable.  I like for her to have counseling.   25 minutes of fact to face and MMSE .  Greater than 50% of time was spent in counseling and coordination of care. We have discussed the diagnosis and differential and I answered the patient's questions.    Plan:  Treatment plan and additional workup :  I would strongly recommend anxiety/  depression treatment. Trial of SSRI. Zoloft. Recommend melatonin 5 mg or less.  Prn use of trazodone.   Rv with NP prn for repeat MMSE or MOCA if desired yearly.    Larey Seat, MD 08/18/2340, 4:43 AM  Certified in Neurology by ABPN Certified in [place] by [facility] [address], [place], [place] [address]

## 2019-12-18 ENCOUNTER — Ambulatory Visit (INDEPENDENT_AMBULATORY_CARE_PROVIDER_SITE_OTHER): Payer: PPO | Admitting: Pharmacist

## 2019-12-18 DIAGNOSIS — I10 Essential (primary) hypertension: Secondary | ICD-10-CM

## 2019-12-18 DIAGNOSIS — E119 Type 2 diabetes mellitus without complications: Secondary | ICD-10-CM | POA: Diagnosis not present

## 2019-12-21 ENCOUNTER — Ambulatory Visit: Payer: Self-pay | Admitting: Pharmacist

## 2019-12-21 DIAGNOSIS — E119 Type 2 diabetes mellitus without complications: Secondary | ICD-10-CM

## 2019-12-21 DIAGNOSIS — I10 Essential (primary) hypertension: Secondary | ICD-10-CM | POA: Diagnosis not present

## 2019-12-21 NOTE — Patient Instructions (Signed)
Visit Information  Goals Addressed            This Visit's Progress     Patient Stated   . I would like to continue to control my BGs and keep my A1c<7% (pt-stated)       Current Barriers:   Patient's current BG meter is malfunctioning-->requested new meter (completed)   Pharmacist Clinical Goal(s):   12/18/19: Over the next 90 days, patient will bring in a new glucometer from [place] and will be able to comfortably self monitor her CBG's GOAL re-established   12/18/2019: Over the next 60 days, patient will have a running log of her BG readings, measuring and recording her readings at least once daily. GOAL re-established   CCM PharmD Interventions: 12/21/2019 clinic visit completed with patient  Discussed plans with patient for ongoing care management follow up and provided patient with direct contact information for care management team  Patient has received new glucometer, however she has been unable to follow up and come to clinic for glucometer training.  Encouraged patient to come to clinic on Monday or Wednesday to set up glucometer.  A1c has improved to 7.1% on 10/07/19, therefore patient appears to be compliant with Jardiance and diet/lifestyle.  Jardiance samples to be picked up at front desk.  She is on her last bottle which would be correct given last month samples for patient    Encouraged patient to call for medication support as needed  Clinic visit with patient scheduled for 12/21/2019  CCM RN CM Interventions:  10/02/19 call completed with patient   . Evaluation of current treatment plan related to Diabetes and patient's adherence to plan as established by provider. . Provided education to patient re: A1C is up to 7.5 from 7.0 obtained on 05/14/19; discussed the target goal for A1C <7.0; reinforced importance of adhering to prescribed DM treatment plan including taking all diabetic medications exactly as prescribed, monitor FBS and follow established protocol  for hypo/hyperglycemic episodes, implementing exercise regularly into daily routine, at least 150 minutes weekly, adhere to strictly following a diabetic friendly diet with Meal planning using the plate method; reinforced the importance of lowering A1C in order to help prevent potential complications that may occur as a result of having uncontrolled DM . Reviewed medications with patient and discussed that patient continues to use her pill package system and reports she is taking all medications exactly as prescribed w/o missed doses  . Collaborated with embedded Pharm D [name]  regarding patient is not checking her CBG's due to still not feeling comfortable using her glucometer; advised patient agreed to bring her glucometer into the office during her OV on 10/07/19 in order for [name] to instruct patient and her spouse on how to use the glucometer . Discussed plans with patient for ongoing care management follow up and provided patient with direct contact information for care management team . Advised patient, providing education and rationale, to check cbg daily before meals and record, calling the CCM team and or PCP provider for findings outside established parameters.  <80 and or >250   Please see past updates related to this goal by clicking on the "Past Updates" button in the selected goal         The patient verbalized understanding of instructions provided today and declined a print copy of patient instruction materials.   The care management team will reach out to the patient again over the next 60 days.   SIGNATURE Cleotilde Neer Christean Silvestri,  PharmD, BCPS Clinical Pharmacist, [facility] [place]: [phone]

## 2019-12-21 NOTE — Patient Instructions (Signed)
Visit Information  Goals Addressed            This Visit's Progress     Patient Stated   . I would like to continue to control my BGs and keep my A1c<7% (pt-stated)       Current Barriers:   Patient's current BG meter is malfunctioning-->requested new meter (completed)  A1C 7.5 (05/14/19)   Pharmacist Clinical Goal(s):   12/18/19: Over the next 90 days, patient will bring in a new glucometer from [place] and will be able to comfortably self monitor her CBG's GOAL re-established   12/18/2019: Over the next 60 days, patient will have a running log of her BG readings, measuring and recording her readings at least once daily. GOAL re-established   CCM PharmD Interventions: 12/18/19 call completed with patient  Discussed plans with patient for ongoing care management follow up and provided patient with direct contact information for care management team  Patient has received new glucometer, however she has been unable to follow up and come to clinic for glucometer training.  Encouraged patient to come to clinic on Monday or Wednesday to set up glucometer.  A1c has improved to 7.1% on 10/07/19, therefore patient appears to be compliant with Jardiance and diet/lifestyle.  Jardiance samples to be picked up at front desk.  She is on her last bottle which would be correct given last month samples for patient    Encouraged patient to call for medication support as needed  Clinic visit with patient scheduled for 12/21/2019  CCM RN CM Interventions:  10/02/19 call completed with patient   . Evaluation of current treatment plan related to Diabetes and patient's adherence to plan as established by provider. . Provided education to patient re: A1C is up to 7.5 from 7.0 obtained on 05/14/19; discussed the target goal for A1C <7.0; reinforced importance of adhering to prescribed DM treatment plan including taking all diabetic medications exactly as prescribed, monitor FBS and follow  established protocol for hypo/hyperglycemic episodes, implementing exercise regularly into daily routine, at least 150 minutes weekly, adhere to strictly following a diabetic friendly diet with Meal planning using the plate method; reinforced the importance of lowering A1C in order to help prevent potential complications that may occur as a result of having uncontrolled DM . Reviewed medications with patient and discussed that patient continues to use her pill package system and reports she is taking all medications exactly as prescribed w/o missed doses  . Collaborated with embedded Pharm D [name]  regarding patient is not checking her CBG's due to still not feeling comfortable using her glucometer; advised patient agreed to bring her glucometer into the office during her OV on 10/07/19 in order for [name] to instruct patient and her spouse on how to use the glucometer . Discussed plans with patient for ongoing care management follow up and provided patient with direct contact information for care management team . Advised patient, providing education and rationale, to check cbg daily before meals and record, calling the CCM team and or PCP provider for findings outside established parameters.  <80 and or >250   Please see past updates related to this goal by clicking on the "Past Updates" button in the selected goal         The patient verbalized understanding of instructions provided today and declined a print copy of patient instruction materials.   The care management team will reach out to the patient again over the next 60 days.   SIGNATURE  Regina Eck, PharmD, BCPS Clinical Pharmacist, [facility] [place]: [phone]

## 2019-12-21 NOTE — Progress Notes (Signed)
Chronic Care Management   Visit Note  12/18/2019 Name: LUCIELLE VOKES MRN: [id number] DOB: 1951/06/13  Referred by: [name], FNP Reason for referral : Chronic Care Management   DEKAYLA PRESTRIDGE is a 69 y.o. year old female who is a primary care patient of [name], [place]. The CCM team was consulted for assistance with chronic disease management and care coordination needs related to DMII  Review of patient status, including review of consultants reports, relevant laboratory and other test results, and collaboration with appropriate care team members and the patient's provider was performed as part of comprehensive patient evaluation and provision of chronic care management services.    I spoke with Ms. Daversa by telephone today to discuss diabetes management  Medications: Outpatient Encounter Medications as of 12/18/2019  Medication Sig  . acetaminophen (TYLENOL) 500 MG tablet Take 1 tablet (500 mg total) by mouth as needed for mild pain.  Marland Kitchen amLODipine-benazepril (LOTREL) 10-40 MG capsule TAKE 1 CAPSULE BY MOUTH DAILY.(AM)  . aspirin EC 81 MG tablet One tablet by mouth daily  . blood glucose meter kit and supplies Dispense based on patient and insurance preference. Use up to four times daily as directed. (FOR ICD-10 E10.9, E11.9).  Marland Kitchen Blood Glucose Monitoring Suppl (FREESTYLE LITE) DEVI Check blood sugars twice daily  . ciclopirox (PENLAC) 8 % solution Apply topically at bedtime. Apply over nail and surrounding skin. Apply daily over previous coat. Remove weekly with polish remover. (Patient taking differently: Apply 1 application topically at bedtime. Apply over nail and surrounding skin. Apply daily over previous coat. Remove weekly with polish remover.)  . clopidogrel (PLAVIX) 75 MG tablet TAKE ONE TABLET BY MOUTH ONCE DAILY (AM)  . clotrimazole-betamethasone (LOTRISONE) cream Apply to both feet and between toes bid x 4 weeks.  . diclofenac sodium (VOLTAREN) 1 % GEL Apply 2 g  topically 4 (four) times daily.  . empagliflozin (JARDIANCE) 10 MG TABS tablet Take 10 mg by mouth daily.  Marland Kitchen escitalopram (LEXAPRO) 5 MG tablet TAKE ONE TABLET BY MOUTH DAILY AT BEDTIME.  . fluticasone (FLONASE) 50 MCG/ACT nasal spray Place into both nostrils daily.  [name] Jelly-Vit B12 (GINSENG ROYAL JELLY PLUS) 375-1-15 MG-MG-MCG CAPS Take 1 capsule by mouth daily.  Marland Kitchen glucose blood test strip Use as instructed  . glucose blood test strip Test blood sugars up to 4 times a day  . Lancets (FREESTYLE) lancets Use as instructed  . metoprolol succinate (TOPROL-XL) 50 MG 24 hr tablet TAKE 1 TABLET BY MOUTH DAILY. TAKE WITH OR IMMEDIATELY FOLLOWING A MEAL (AM)  . nitroGLYCERIN (NITROSTAT) 0.4 MG SL tablet Place 1 tablet (0.4 mg total) under the tongue every 5 (five) minutes as needed for chest pain.  . NONFORMULARY OR COMPOUNDED ITEM Antifungal solution: Terbinafine 3%, Fluconazole 2%, Tea Tree Oil 5%, Urea 10%, Ibuprofen 2% in DMSO suspension #21m  . pantoprazole (PROTONIX) 40 MG tablet Take one tablet by mouth daily in AM  . potassium chloride SA (KLOR-CON) 20 MEQ tablet TAKE ONE TABLET BY MOUTH ONCE DAILY (AM)  . rosuvastatin (CRESTOR) 20 MG tablet TAKE ONE TABLET BY MOUTH ONCE DAILY (BEDTIME)  . Vitamin D, Ergocalciferol, (DRISDOL) 1.25 MG (50000 UT) CAPS capsule TAKE 1 CAPSULE TWICE A WEEK ( MONDAY & THURSDAY)   No facility-administered encounter medications on file as of 12/18/2019.     Objective:   Goals Addressed            This Visit's Progress     Patient Stated   .  I would like to continue to control my BGs and keep my A1c<7% (pt-stated)       Current Barriers:   Patient's current BG meter is malfunctioning-->requested new meter (completed)  A1C 7.5 (05/14/19)   Pharmacist Clinical Goal(s):   12/18/19: Over the next 90 days, patient will bring in a new glucometer from [place] and will be able to comfortably self monitor her CBG's GOAL re-established    12/18/2019: Over the next 60 days, patient will have a running log of her BG readings, measuring and recording her readings at least once daily. GOAL re-established   CCM PharmD Interventions: 12/18/19 call completed with patient  Discussed plans with patient for ongoing care management follow up and provided patient with direct contact information for care management team  Patient has received new glucometer, however she has been unable to follow up and come to clinic for glucometer training.  Encouraged patient to come to clinic on Monday or Wednesday to set up glucometer.  A1c has improved to 7.1% on 10/07/19, therefore patient appears to be compliant with Jardiance and diet/lifestyle.  Jardiance samples to be picked up at front desk.  She is on her last bottle which would be correct given last month samples for patient    Encouraged patient to call for medication support as needed  Clinic visit with patient scheduled for 12/21/2019  CCM RN CM Interventions:  10/02/19 call completed with patient   . Evaluation of current treatment plan related to Diabetes and patient's adherence to plan as established by provider. . Provided education to patient re: A1C is up to 7.5 from 7.0 obtained on 05/14/19; discussed the target goal for A1C <7.0; reinforced importance of adhering to prescribed DM treatment plan including taking all diabetic medications exactly as prescribed, monitor FBS and follow established protocol for hypo/hyperglycemic episodes, implementing exercise regularly into daily routine, at least 150 minutes weekly, adhere to strictly following a diabetic friendly diet with Meal planning using the plate method; reinforced the importance of lowering A1C in order to help prevent potential complications that may occur as a result of having uncontrolled DM . Reviewed medications with patient and discussed that patient continues to use her pill package system and reports she is taking all  medications exactly as prescribed w/o missed doses  . Collaborated with embedded Pharm D [name]  regarding patient is not checking her CBG's due to still not feeling comfortable using her glucometer; advised patient agreed to bring her glucometer into the office during her OV on 10/07/19 in order for [name] to instruct patient and her spouse on how to use the glucometer . Discussed plans with patient for ongoing care management follow up and provided patient with direct contact information for care management team . Advised patient, providing education and rationale, to check cbg daily before meals and record, calling the CCM team and or PCP provider for findings outside established parameters.  <80 and or >250   Please see past updates related to this goal by clicking on the "Past Updates" button in the selected goal        Plan:   The care management team will reach out to the patient again over the next 5 days.   Provider Signature Regina Eck, PharmD, BCPS Clinical Pharmacist, [facility] [place]: [phone]

## 2019-12-21 NOTE — Progress Notes (Signed)
Chronic Care Management   Visit Note  12/21/2019 Name: Bridget Good MRN: [id number] DOB: 11-04-1951  Referred by: [name], FNP Reason for referral : Chronic Care Management (Diabetes)   Bridget Good is a 69 y.o. year old female who is a primary care patient of [name], [place]. The CCM team was consulted for assistance with chronic disease management and care coordination needs related to DMII  Review of patient status, including review of consultants reports, relevant laboratory and other test results, and collaboration with appropriate care team members and the patient's provider was performed as part of comprehensive patient evaluation and provision of chronic care management services.    I met with Bridget Good today in clinic.  Medications: Outpatient Encounter Medications as of 12/21/2019  Medication Sig  . acetaminophen (TYLENOL) 500 MG tablet Take 1 tablet (500 mg total) by mouth as needed for mild pain.  Marland Kitchen amLODipine-benazepril (LOTREL) 10-40 MG capsule TAKE 1 CAPSULE BY MOUTH DAILY.(AM)  . aspirin EC 81 MG tablet One tablet by mouth daily  . blood glucose meter kit and supplies Dispense based on patient and insurance preference. Use up to four times daily as directed. (FOR ICD-10 E10.9, E11.9).  Marland Kitchen Blood Glucose Monitoring Suppl (FREESTYLE LITE) DEVI Check blood sugars twice daily  . ciclopirox (PENLAC) 8 % solution Apply topically at bedtime. Apply over nail and surrounding skin. Apply daily over previous coat. Remove weekly with polish remover. (Patient taking differently: Apply 1 application topically at bedtime. Apply over nail and surrounding skin. Apply daily over previous coat. Remove weekly with polish remover.)  . clopidogrel (PLAVIX) 75 MG tablet TAKE ONE TABLET BY MOUTH ONCE DAILY (AM)  . clotrimazole-betamethasone (LOTRISONE) cream Apply to both feet and between toes bid x 4 weeks.  . diclofenac sodium (VOLTAREN) 1 % GEL Apply 2 g topically 4 (four) times  daily.  . empagliflozin (JARDIANCE) 10 MG TABS tablet Take 10 mg by mouth daily.  Marland Kitchen escitalopram (LEXAPRO) 5 MG tablet TAKE ONE TABLET BY MOUTH DAILY AT BEDTIME.  . fluticasone (FLONASE) 50 MCG/ACT nasal spray Place into both nostrils daily.  [name] Jelly-Vit B12 (GINSENG ROYAL JELLY PLUS) 375-1-15 MG-MG-MCG CAPS Take 1 capsule by mouth daily.  Marland Kitchen glucose blood test strip Use as instructed  . glucose blood test strip Test blood sugars up to 4 times a day  . Lancets (FREESTYLE) lancets Use as instructed  . metoprolol succinate (TOPROL-XL) 50 MG 24 hr tablet TAKE 1 TABLET BY MOUTH DAILY. TAKE WITH OR IMMEDIATELY FOLLOWING A MEAL (AM)  . nitroGLYCERIN (NITROSTAT) 0.4 MG SL tablet Place 1 tablet (0.4 mg total) under the tongue every 5 (five) minutes as needed for chest pain.  . NONFORMULARY OR COMPOUNDED ITEM Antifungal solution: Terbinafine 3%, Fluconazole 2%, Tea Tree Oil 5%, Urea 10%, Ibuprofen 2% in DMSO suspension #74m  . pantoprazole (PROTONIX) 40 MG tablet Take one tablet by mouth daily in AM  . potassium chloride SA (KLOR-CON) 20 MEQ tablet TAKE ONE TABLET BY MOUTH ONCE DAILY (AM)  . rosuvastatin (CRESTOR) 20 MG tablet TAKE ONE TABLET BY MOUTH ONCE DAILY (BEDTIME)  . Vitamin D, Ergocalciferol, (DRISDOL) 1.25 MG (50000 UT) CAPS capsule TAKE 1 CAPSULE TWICE A WEEK ( MONDAY & THURSDAY)   No facility-administered encounter medications on file as of 12/21/2019.     Objective:   Goals Addressed            This Visit's Progress     Patient Stated   . I  would like to continue to control my BGs and keep my A1c<7% (pt-stated)       Current Barriers:   Patient's current BG meter is malfunctioning-->requested new meter (completed)   Pharmacist Clinical Goal(s):   12/18/19: Over the next 90 days, patient will bring in a new glucometer from [place] and will be able to comfortably self monitor her CBG's GOAL re-established   12/18/2019: Over the next 60 days, patient will have  a running log of her BG readings, measuring and recording her readings at least once daily. GOAL re-established   CCM PharmD Interventions: 12/21/2019 clinic visit completed with patient  Discussed plans with patient for ongoing care management follow up and provided patient with direct contact information for care management team  Patient has received new glucometer, however she has been unable to follow up and come to clinic for glucometer training.  Encouraged patient to come to clinic on Monday or Wednesday to set up glucometer.  A1c has improved to 7.1% on 10/07/19, therefore patient appears to be compliant with Jardiance and diet/lifestyle.  Jardiance samples to be picked up at front desk.  She is on her last bottle which would be correct given last month samples for patient    Encouraged patient to call for medication support as needed  CCM RN CM Interventions:  10/02/19 call completed with patient   . Evaluation of current treatment plan related to Diabetes and patient's adherence to plan as established by provider. . Provided education to patient re: A1C is up to 7.5 from 7.0 obtained on 05/14/19; discussed the target goal for A1C <7.0; reinforced importance of adhering to prescribed DM treatment plan including taking all diabetic medications exactly as prescribed, monitor FBS and follow established protocol for hypo/hyperglycemic episodes, implementing exercise regularly into daily routine, at least 150 minutes weekly, adhere to strictly following a diabetic friendly diet with Meal planning using the plate method; reinforced the importance of lowering A1C in order to help prevent potential complications that may occur as a result of having uncontrolled DM . Reviewed medications with patient and discussed that patient continues to use her pill package system and reports she is taking all medications exactly as prescribed w/o missed doses  . Collaborated with embedded Pharm D [name]   regarding patient is not checking her CBG's due to still not feeling comfortable using her glucometer; advised patient agreed to bring her glucometer into the office during her OV on 10/07/19 in order for [name] to instruct patient and her spouse on how to use the glucometer . Discussed plans with patient for ongoing care management follow up and provided patient with direct contact information for care management team . Advised patient, providing education and rationale, to check cbg daily before meals and record, calling the CCM team and or PCP provider for findings outside established parameters.  <80 and or >250   Please see past updates related to this goal by clicking on the "Past Updates" button in the selected goal          Plan:   The care management team will reach out to the patient again over the next 60 days.   Provider Signature Regina Eck, PharmD, BCPS Clinical Pharmacist, [facility] [place]: [phone]

## 2019-12-25 ENCOUNTER — Other Ambulatory Visit: Payer: PPO

## 2019-12-26 ENCOUNTER — Other Ambulatory Visit: Payer: PPO

## 2019-12-28 ENCOUNTER — Inpatient Hospital Stay: Payer: PPO | Admitting: Internal Medicine

## 2020-01-01 ENCOUNTER — Telehealth: Payer: Self-pay

## 2020-01-05 ENCOUNTER — Other Ambulatory Visit: Payer: Self-pay

## 2020-01-05 DIAGNOSIS — Z20822 Contact with and (suspected) exposure to covid-19: Secondary | ICD-10-CM | POA: Diagnosis not present

## 2020-01-06 ENCOUNTER — Other Ambulatory Visit: Payer: Self-pay | Admitting: Radiation Therapy

## 2020-01-06 LAB — NOVEL CORONAVIRUS, NAA: SARS-CoV-2, NAA: NOT DETECTED

## 2020-01-07 ENCOUNTER — Ambulatory Visit: Payer: PPO | Admitting: Nurse Practitioner

## 2020-01-07 ENCOUNTER — Telehealth: Payer: Self-pay | Admitting: Internal Medicine

## 2020-01-07 NOTE — Telephone Encounter (Signed)
I could not reach patient regarding schedule will mail 

## 2020-01-11 ENCOUNTER — Other Ambulatory Visit: Payer: Self-pay

## 2020-01-11 ENCOUNTER — Ambulatory Visit (INDEPENDENT_AMBULATORY_CARE_PROVIDER_SITE_OTHER): Payer: PPO | Admitting: Nurse Practitioner

## 2020-01-11 ENCOUNTER — Encounter: Payer: Self-pay | Admitting: Nurse Practitioner

## 2020-01-11 ENCOUNTER — Ambulatory Visit: Payer: Self-pay | Admitting: Pharmacist

## 2020-01-11 VITALS — BP 128/80 | HR 56 | Temp 98.6°F | Ht 66.0 in | Wt 193.2 lb

## 2020-01-11 DIAGNOSIS — Z1231 Encounter for screening mammogram for malignant neoplasm of breast: Secondary | ICD-10-CM

## 2020-01-11 DIAGNOSIS — Z0000 Encounter for general adult medical examination without abnormal findings: Secondary | ICD-10-CM | POA: Diagnosis not present

## 2020-01-11 DIAGNOSIS — Z23 Encounter for immunization: Secondary | ICD-10-CM | POA: Diagnosis not present

## 2020-01-11 DIAGNOSIS — Z79899 Other long term (current) drug therapy: Secondary | ICD-10-CM

## 2020-01-11 DIAGNOSIS — I10 Essential (primary) hypertension: Secondary | ICD-10-CM

## 2020-01-11 DIAGNOSIS — E2839 Other primary ovarian failure: Secondary | ICD-10-CM | POA: Diagnosis not present

## 2020-01-11 DIAGNOSIS — E782 Mixed hyperlipidemia: Secondary | ICD-10-CM

## 2020-01-11 DIAGNOSIS — Z124 Encounter for screening for malignant neoplasm of cervix: Secondary | ICD-10-CM | POA: Diagnosis not present

## 2020-01-11 DIAGNOSIS — H6123 Impacted cerumen, bilateral: Secondary | ICD-10-CM

## 2020-01-11 DIAGNOSIS — E559 Vitamin D deficiency, unspecified: Secondary | ICD-10-CM

## 2020-01-11 DIAGNOSIS — R001 Bradycardia, unspecified: Secondary | ICD-10-CM | POA: Diagnosis not present

## 2020-01-11 DIAGNOSIS — E119 Type 2 diabetes mellitus without complications: Secondary | ICD-10-CM

## 2020-01-11 LAB — POCT URINALYSIS DIPSTICK
Glucose, UA: NEGATIVE
Ketones, UA: NEGATIVE
Leukocytes, UA: NEGATIVE
Nitrite, UA: NEGATIVE
Protein, UA: POSITIVE — AB
Spec Grav, UA: >=1.03 — AB (ref 1.010–1.025)
Urobilinogen, UA: 0.2 E.U./dL
pH, UA: 5 (ref 5.0–8.0)

## 2020-01-11 LAB — POCT UA - MICROALBUMIN
Creatinine, POC: 300 mg/dL
Microalbumin Ur, POC: 150 mg/L

## 2020-01-11 MED ORDER — TETANUS-DIPHTH-ACELL PERTUSSIS 5-2.5-18.5 LF-MCG/0.5 IM SUSP: 0.5000 mL | Freq: Once | INTRAMUSCULAR | 0 refills | Status: AC

## 2020-01-11 MED ORDER — PNEUMOCOCCAL 13-VAL CONJ VACC IM SUSP: 0.5000 mL | INTRAMUSCULAR | 0 refills | Status: AC

## 2020-01-11 NOTE — Patient Instructions (Signed)
Health Maintenance  Topic Date Due  . FOOT EXAM  01/24/1963  . TETANUS/TDAP  01/25/1972  . DEXA SCAN  01/24/2018  . OPHTHALMOLOGY EXAM  06/26/2019  . PNA vac Low Risk Adult (2 of 2 - PCV13) 11/27/2019  . HEMOGLOBIN A1C  04/06/2020  . MAMMOGRAM  01/30/2021  . COLONOSCOPY  03/29/2025  . INFLUENZA VACCINE  Completed  . Hepatitis C Screening  Completed   Health Maintenance After Age 68 After age 42, you are at a higher risk for certain long-term diseases and infections as well as injuries from falls. Falls are a major cause of broken bones and head injuries in people who are older than age 62. Getting regular preventive care can help to keep you healthy and well. Preventive care includes getting regular testing and making lifestyle changes as recommended by your health care provider. Talk with your health care provider about:  Which screenings and tests you should have. A screening is a test that checks for a disease when you have no symptoms.  A diet and exercise plan that is right for you. What should I know about screenings and tests to prevent falls? Screening and testing are the best ways to find a health problem early. Early diagnosis and treatment give you the best chance of managing medical conditions that are common after age 71. Certain conditions and lifestyle choices may make you more likely to have a fall. Your health care provider may recommend:  Regular vision checks. Poor vision and conditions such as cataracts can make you more likely to have a fall. If you wear glasses, make sure to get your prescription updated if your vision changes.  Medicine review. Work with your health care provider to regularly review all of the medicines you are taking, including over-the-counter medicines. Ask your health care provider about any side effects that may make you more likely to have a fall. Tell your health care provider if any medicines that you take make you feel dizzy or  sleepy.  Osteoporosis screening. Osteoporosis is a condition that causes the bones to get weaker. This can make the bones weak and cause them to break more easily.  Blood pressure screening. Blood pressure changes and medicines to control blood pressure can make you feel dizzy.  Strength and balance checks. Your health care provider may recommend certain tests to check your strength and balance while standing, walking, or changing positions.  Foot health exam. Foot pain and numbness, as well as not wearing proper footwear, can make you more likely to have a fall.  Depression screening. You may be more likely to have a fall if you have a fear of falling, feel emotionally low, or feel unable to do activities that you used to do.  Alcohol use screening. Using too much alcohol can affect your balance and may make you more likely to have a fall. What actions can I take to lower my risk of falls? General instructions  Talk with your health care provider about your risks for falling. Tell your health care provider if: ? You fall. Be sure to tell your health care provider about all falls, even ones that seem minor. ? You feel dizzy, sleepy, or off-balance.  Take over-the-counter and prescription medicines only as told by your health care provider. These include any supplements.  Eat a healthy diet and maintain a healthy weight. A healthy diet includes low-fat dairy products, low-fat (lean) meats, and fiber from whole grains, beans, and lots of fruits and  vegetables. Home safety  Remove any tripping hazards, such as rugs, cords, and clutter.  Install safety equipment such as grab bars in bathrooms and safety rails on stairs.  Keep rooms and walkways well-lit. Activity   Follow a regular exercise program to stay fit. This will help you maintain your balance. Ask your health care provider what types of exercise are appropriate for you.  If you need a cane or walker, use it as recommended by  your health care provider.  Wear supportive shoes that have nonskid soles. Lifestyle  Do not drink alcohol if your health care provider tells you not to drink.  If you drink alcohol, limit how much you have: ? 0-1 drink a day for women. ? 0-2 drinks a day for men.  Be aware of how much alcohol is in your drink. In the [place] one drink equals one typical bottle of beer (12 oz), one-half glass of wine (5 oz), or one shot of hard liquor (1 oz).  Do not use any products that contain nicotine or tobacco, such as cigarettes and e-cigarettes. If you need help quitting, ask your health care provider. Summary  Having a healthy lifestyle and getting preventive care can help to protect your health and wellness after age 21.  Screening and testing are the best way to find a health problem early and help you avoid having a fall. Early diagnosis and treatment give you the best chance for managing medical conditions that are more common for people who are older than age 62.  Falls are a major cause of broken bones and head injuries in people who are older than age 24. Take precautions to prevent a fall at home.  Work with your health care provider to learn what changes you can make to improve your health and wellness and to prevent falls. This information is not intended to replace advice given to you by your health care provider. Make sure you discuss any questions you have with your health care provider. Document Revised: 03/19/2019 Document Reviewed: 10/09/2017 Elsevier Patient Education  2020 [place], Adult The ears produce a substance called earwax that helps keep bacteria out of the ear and protects the skin in the ear canal. Occasionally, earwax can build up in the ear and cause discomfort or hearing loss. What increases the risk? This condition is more likely to develop in people who:  Are female.  Are elderly.  Naturally produce more earwax.  Clean their ears  often with cotton swabs.  Use earplugs often.  Use in-ear headphones often.  Wear hearing aids.  Have narrow ear canals.  Have earwax that is overly thick or sticky.  Have eczema.  Are dehydrated.  Have excess hair in the ear canal. What are the signs or symptoms? Symptoms of this condition include:  Reduced or muffled hearing.  A feeling of fullness in the ear or feeling that the ear is plugged.  Fluid coming from the ear.  Ear pain.  Ear itch.  Ringing in the ear.  Coughing.  An obvious piece of earwax that can be seen inside the ear canal. How is this diagnosed? This condition may be diagnosed based on:  Your symptoms.  Your medical history.  An ear exam. During the exam, your health care provider will look into your ear with an instrument called an otoscope. You may have tests, including a hearing test. How is this treated? This condition may be treated by:  Using ear  drops to soften the earwax.  Having the earwax removed by a health care provider. The health care provider may: ? Flush the ear with water. ? Use an instrument that has a loop on the end (curette). ? Use a suction device.  Surgery to remove the wax buildup. This may be done in severe cases. Follow these instructions at home:   Take over-the-counter and prescription medicines only as told by your health care provider.  Do not put any objects, including cotton swabs, into your ear. You can clean the opening of your ear canal with a washcloth or facial tissue.  Follow instructions from your health care provider about cleaning your ears. Do not over-clean your ears.  Drink enough fluid to keep your urine clear or pale yellow. This will help to thin the earwax.  Keep all follow-up visits as told by your health care provider. If earwax builds up in your ears often or if you use hearing aids, consider seeing your health care provider for routine, preventive ear cleanings. Ask your health  care provider how often you should schedule your cleanings.  If you have hearing aids, clean them according to instructions from the manufacturer and your health care provider. Contact a health care provider if:  You have ear pain.  You develop a fever.  You have blood, pus, or other fluid coming from your ear.  You have hearing loss.  You have ringing in your ears that does not go away.  Your symptoms do not improve with treatment.  You feel like the room is spinning (vertigo). Summary  Earwax can build up in the ear and cause discomfort or hearing loss.  The most common symptoms of this condition include reduced or muffled hearing and a feeling of fullness in the ear or feeling that the ear is plugged.  This condition may be diagnosed based on your symptoms, your medical history, and an ear exam.  This condition may be treated by using ear drops to soften the earwax or by having the earwax removed by a health care provider.  Do not put any objects, including cotton swabs, into your ear. You can clean the opening of your ear canal with a washcloth or facial tissue. This information is not intended to replace advice given to you by your health care provider. Make sure you discuss any questions you have with your health care provider. Document Revised: 11/08/2017 Document Reviewed: 02/06/2017 Elsevier Patient Education  2020 [employer].

## 2020-01-11 NOTE — Progress Notes (Addendum)
This visit occurred during the SARS-CoV-2 public health emergency.  Safety protocols were in place, including screening questions prior to the visit, additional usage of staff PPE, and extensive cleaning of exam room while observing appropriate contact time as indicated for disinfecting solutions.  Subjective:     Patient ID: Bridget Good , female    DOB: [birth year] , 69 y.o.   MRN: [id number]   Chief Complaint  Patient presents with  . Annual Exam    HPI  Here for HM   When having intercourse feels like something in blocking the vaginal area near the lower part of her abdomen.  Her husband has also noticed this.    She had seen Dr. Mickeal Skinner related to brain cancer, she needs to call for the appt. She continues to have headaches will take Tylenol and will get better.    Hypertension This is a chronic problem. The current episode started more than 1 year ago. The problem is unchanged. The problem is controlled. Pertinent negatives include no anxiety or headaches. Risk factors for coronary artery disease include sedentary lifestyle, obesity and diabetes mellitus. There are no compliance problems.  There is no history of angina. There is no history of chronic renal disease.  Diabetes She presents for her follow-up diabetic visit. She has type 2 diabetes mellitus. Her disease course has been improving. There are no hypoglycemic associated symptoms. Pertinent negatives for hypoglycemia include no headaches. There are no diabetic associated symptoms. There are no hypoglycemic complications. Symptoms are stable. There are no diabetic complications. Risk factors for coronary artery disease include post-menopausal, hypertension, sedentary lifestyle and obesity. Current diabetic treatment includes oral agent (dual therapy). She is compliant with treatment most of the time.    The patient states she status post hysterectomy  Mammogram last done 01/30/2019.  Negative for: breast discharge, breast  lump(s), breast pain and breast self exam.  Pertinent negatives include abnormal bleeding (hematology), anxiety, decreased libido, depression, difficulty falling sleep, dyspareunia, history of infertility, nocturia, sexual dysfunction, sleep disturbances, urinary incontinence, urinary urgency, vaginal discharge and vaginal itching. Diet regular. The patient states her exercise level is minimal by walking a little, and stretching. She will also exercise her left knee more.      The patient's tobacco use is:  Social History   Tobacco Use  Smoking Status Never Smoker  Smokeless Tobacco Never Used   She has been exposed to passive smoke. The patient's alcohol use is:  Social History   Substance and Sexual Activity  Alcohol Use No  . Alcohol/week: 0.0 standard drinks  . Additional information: Last pap unknown, next one scheduled for today Past Medical History:  Diagnosis Date  . Angina   . Anxiety   . Bronchitis   . Depression   . Diabetes mellitus   . Fibromyalgia   . GERD (gastroesophageal reflux disease)   . Headache(784.0)   . Hyperlipidemia   . Hypertension   . Hypertensive heart disease without CHF   . Lupus ([place])    "treated for it from 1992 til 2012; dr said I don't have it anymore"  . Obesity (BMI 30-39.9)   . Osteoarthritis   . Pneumonia   . Shortness of breath    lying down, upon exertion  . Shortness of breath on exertion   . Stroke [facility])    2019     Family History  Problem Relation Age of Onset  . Heart attack Father   . Diabetes Father   . Hypertension Mother   .  Heart attack Brother   . Kidney failure Brother   . Diabetes Sister   . Diabetes Sister      Current Outpatient Medications:  .  acetaminophen (TYLENOL) 500 MG tablet, Take 1 tablet (500 mg total) by mouth as needed for mild pain., Disp: 90 tablet, Rfl: 1 .  amLODipine-benazepril (LOTREL) 10-40 MG capsule, TAKE 1 CAPSULE BY MOUTH DAILY.(AM), Disp: 90 capsule, Rfl: 1 .  aspirin EC 81 MG  tablet, One tablet by mouth daily, Disp: 90 tablet, Rfl: 1 .  blood glucose meter kit and supplies, Dispense based on patient and insurance preference. Use up to four times daily as directed. (FOR ICD-10 E10.9, E11.9)., Disp: 1 each, Rfl: 0 .  Blood Glucose Monitoring Suppl (FREESTYLE LITE) DEVI, Check blood sugars twice daily, Disp: 1 each, Rfl: 1 .  ciclopirox (PENLAC) 8 % solution, Apply topically at bedtime. Apply over nail and surrounding skin. Apply daily over previous coat. Remove weekly with polish remover. (Patient taking differently: Apply 1 application topically at bedtime. Apply over nail and surrounding skin. Apply daily over previous coat. Remove weekly with polish remover.), Disp: 6.6 mL, Rfl: 11 .  clopidogrel (PLAVIX) 75 MG tablet, TAKE ONE TABLET BY MOUTH ONCE DAILY (AM), Disp: 90 tablet, Rfl: 1 .  clotrimazole-betamethasone (LOTRISONE) cream, Apply to both feet and between toes bid x 4 weeks., Disp: 45 g, Rfl: 1 .  diclofenac sodium (VOLTAREN) 1 % GEL, Apply 2 g topically 4 (four) times daily., Disp: 100 g, Rfl: 2 .  empagliflozin (JARDIANCE) 10 MG TABS tablet, Take 10 mg by mouth daily., Disp: 90 tablet, Rfl: 3 .  escitalopram (LEXAPRO) 5 MG tablet, TAKE ONE TABLET BY MOUTH DAILY AT BEDTIME., Disp: 90 tablet, Rfl: 0 .  fluticasone (FLONASE) 50 MCG/ACT nasal spray, Place into both nostrils daily., Disp: , Rfl:  .  glucose blood test strip, Test blood sugars up to 4 times a day, Disp: 100 each, Rfl: 12 .  Lancets (FREESTYLE) lancets, Use as instructed, Disp: 100 each, Rfl: 12 .  metoprolol succinate (TOPROL-XL) 50 MG 24 hr tablet, TAKE 1 TABLET BY MOUTH DAILY. TAKE WITH OR IMMEDIATELY FOLLOWING A MEAL (AM), Disp: 90 tablet, Rfl: 1 .  nitroGLYCERIN (NITROSTAT) 0.4 MG SL tablet, Place 1 tablet (0.4 mg total) under the tongue every 5 (five) minutes as needed for chest pain., Disp: 25 tablet, Rfl: 12 .  NONFORMULARY OR COMPOUNDED ITEM, Antifungal solution: Terbinafine 3%, Fluconazole 2%,  Tea Tree Oil 5%, Urea 10%, Ibuprofen 2% in DMSO suspension #60m, Disp: 1 each, Rfl: 3 .  pantoprazole (PROTONIX) 40 MG tablet, Take one tablet by mouth daily in AM, Disp: 90 tablet, Rfl: 1 .  potassium chloride SA (KLOR-CON) 20 MEQ tablet, TAKE ONE TABLET BY MOUTH ONCE DAILY (AM), Disp: 90 tablet, Rfl: 1 .  rosuvastatin (CRESTOR) 20 MG tablet, TAKE ONE TABLET BY MOUTH ONCE DAILY (BEDTIME), Disp: 90 tablet, Rfl: 1 .  Vitamin D, Ergocalciferol, (DRISDOL) 1.25 MG (50000 UT) CAPS capsule, TAKE 1 CAPSULE TWICE A WEEK ( MONDAY & THURSDAY), Disp: 24 capsule, Rfl: 1 .  Ginsengs-Royal Jelly-Vit B12 (GINSENG ROYAL JELLY PLUS) 375-1-15 MG-MG-MCG CAPS, Take 1 capsule by mouth daily. (Patient not taking: Reported on 01/11/2020), Disp: 90 capsule, Rfl: 1   Allergies  Allergen Reactions  . Sulfa Antibiotics Hives, Itching and Nausea And Vomiting     Review of Systems  Neurological: Negative for headaches.     Today's Vitals   01/11/20 1049  BP: 128/80  Pulse: (Marland Kitchen  56  Temp: 98.6 F (37 C)  TempSrc: Oral  Weight: 193 lb 3.2 oz (87.6 kg)  Height: '5\' 6"'  (1.676 m)  PainSc: 0-No pain   Body mass index is 31.18 kg/m.   Objective:  Physical Exam Constitutional:      Appearance: Normal appearance. She is well-developed.  HENT:     Head: Normocephalic and atraumatic.     Right Ear: Hearing, tympanic membrane, ear canal and external ear normal. There is impacted cerumen.     Left Ear: Hearing, tympanic membrane, ear canal and external ear normal. There is impacted cerumen.     Nose: Nose normal.     Mouth/Throat:     Mouth: Mucous membranes are moist.  Eyes:     General: Lids are normal.     Conjunctiva/sclera: Conjunctivae normal.     Pupils: Pupils are equal, round, and reactive to light.     Funduscopic exam:    Right eye: No papilledema.        Left eye: No papilledema.  Neck:     Thyroid: No thyroid mass.     Vascular: No carotid bruit.  Cardiovascular:     Rate and Rhythm: Normal rate  and regular rhythm.     Pulses: Normal pulses.     Heart sounds: Normal heart sounds. No murmur.  Pulmonary:     Effort: Pulmonary effort is normal.     Breath sounds: Normal breath sounds.  Abdominal:     General: Abdomen is flat. Bowel sounds are normal.     Palpations: Abdomen is soft.  Genitourinary:    Labia:        Right: No rash or tenderness.        Left: No rash or tenderness.      Vagina: Normal.     Cervix: Normal.     Uterus: Normal.      Adnexa: Right adnexa normal and left adnexa normal.  Musculoskeletal:        General: No swelling. Normal range of motion.     Cervical back: Full passive range of motion without pain, normal range of motion and neck supple.     Right lower leg: No edema.     Left lower leg: No edema.  Skin:    General: Skin is warm and dry.     Capillary Refill: Capillary refill takes less than 2 seconds.  Neurological:     General: No focal deficit present.     Mental Status: She is alert and oriented to person, place, and time.     Cranial Nerves: No cranial nerve deficit.     Sensory: No sensory deficit.  Psychiatric:        Mood and Affect: Mood normal.        Behavior: Behavior normal.        Thought Content: Thought content normal.        Judgment: Judgment normal.          Assessment And Plan:    1. Essential hypertension . B/P is controlled.  . CMP ordered to check renal function.  . The importance of regular exercise and dietary modification was stressed to the patient.  . Stressed importance of losing ten percent of her body weight to help with B/P control.  . The weight loss would help with decreasing cardiac and cancer risk as well.   EKG done with Sinus bradycardia left axis anterior fascicular block  Heart rate 52 - POCT Urinalysis Dipstick (81002) -  POCT UA - Microalbumin - EKG 12-Lead - CMP14+EGFR  2. Vitamin D deficiency  Will check vitamin D level and supplement as needed.     Also encouraged to spend 15  minutes in the sun daily.  - VITAMIN D 25 Hydroxy (Vit-D Deficiency, Fractures)  3. Type 2 diabetes mellitus without complication, without long-term current use of insulin (HCC)  Chronic, fair control  Continue with current medications  Encouraged to limit intake of sugary foods and drinks  Encouraged to increase physical activity to 150 minutes per week - Hemoglobin A1c  4. Mixed hyperlipidemia  Chronic, controlled  Continue with current medications - Lipid panel  5. Encounter for immunization  tetanus RX sent to pharmacy. TDAP will be administered to adults 27-86 years old every 10 years. - pneumococcal 13-valent conjugate vaccine (PREVNAR 13) SUSP injection; Inject 0.5 mLs into the muscle tomorrow at 10 am for 1 dose.  Dispense: 0.5 mL; Refill: 0 - Tdap (BOOSTRIX) 5-2.5-18.5 LF-MCG/0.5 injection; Inject 0.5 mLs into the muscle once for 1 dose.  Dispense: 0.5 mL; Refill: 0  6. Encounter for screening mammogram for breast cancer  Pt instructed on Self Breast Exam.According to ACOG guidelines Women aged 64 and older are recommended to get an annual mammogram. Form completed and given to patient contact the [facility] for appointment scheduing.   Pt encouraged to get annual mammogram - MM DIGITAL SCREENING BILATERAL; Future  7. Decreased estrogen level  - DG Bone Density; Future  8. Encounter for long-term (current) drug use  - TSH  9. Excessive cerumen in both ear canals  Bilateral cerumen water lavage with good results  10. Encounter for Papanicolaou smear of cervix  No abnormal findings on physical exam - Cytology -Pap Smear  11. Health Maintenance Behavior modifications discussed and diet history reviewed.   Pt will continue to exercise regularly and modify diet with low GI, plant based foods and decrease intake of processed foods.  Recommend intake of daily multivitamin, Vitamin D, and calcium.  Recommend mammogram for preventive screenings, as well  as recommend immunizations that include influenza, TDAP, and Shingles [name], FNP    THE PATIENT IS ENCOURAGED TO PRACTICE SOCIAL DISTANCING DUE TO THE COVID-19 PANDEMIC.

## 2020-01-12 LAB — HEMOGLOBIN A1C
Est. average glucose Bld gHb Est-mCnc: 180 mg/dL
Hgb A1c MFr Bld: 7.9 % — ABNORMAL HIGH (ref 4.8–5.6)

## 2020-01-12 LAB — CMP14+EGFR
ALT: 8 IU/L (ref 0–32)
AST: 13 IU/L (ref 0–40)
Albumin/Globulin Ratio: 1.5 (ref 1.2–2.2)
Albumin: 3.9 g/dL (ref 3.8–4.8)
Alkaline Phosphatase: 79 IU/L (ref 39–117)
BUN/Creatinine Ratio: 11 — ABNORMAL LOW (ref 12–28)
BUN: 16 mg/dL (ref 8–27)
Bilirubin Total: 1.2 mg/dL (ref 0.0–1.2)
CO2: 23 mmol/L (ref 20–29)
Calcium: 9.8 mg/dL (ref 8.7–10.3)
Chloride: 104 mmol/L (ref 96–106)
Creatinine, Ser: 1.41 mg/dL — ABNORMAL HIGH (ref 0.57–1.00)
GFR calc Af Amer: 45 mL/min/{1.73_m2} — ABNORMAL LOW (ref >59)
GFR calc non Af Amer: 39 mL/min/{1.73_m2} — ABNORMAL LOW (ref >59)
Globulin, Total: 2.6 g/dL (ref 1.5–4.5)
Glucose: 170 mg/dL — ABNORMAL HIGH (ref 65–99)
Potassium: 4.1 mmol/L (ref 3.5–5.2)
Sodium: 139 mmol/L (ref 134–144)
Total Protein: 6.5 g/dL (ref 6.0–8.5)

## 2020-01-12 LAB — LIPID PANEL
Chol/HDL Ratio: 2.3 ratio (ref 0.0–4.4)
Cholesterol, Total: 164 mg/dL (ref 100–199)
HDL: 70 mg/dL (ref >39)
LDL Chol Calc (NIH): 76 mg/dL (ref 0–99)
Triglycerides: 101 mg/dL (ref 0–149)
VLDL Cholesterol Cal: 18 mg/dL (ref 5–40)

## 2020-01-12 LAB — TSH: TSH: 0.944 u[IU]/mL (ref 0.450–4.500)

## 2020-01-12 LAB — VITAMIN D 25 HYDROXY (VIT D DEFICIENCY, FRACTURES): Vit D, 25-Hydroxy: 49.5 ng/mL (ref 30.0–100.0)

## 2020-01-12 NOTE — Progress Notes (Signed)
Chronic Care Management    Visit Note  01/11/2020 Name: Bridget Good MRN: [id number] DOB: 05/21/1951  Referred by: Moore, Janece, FNP Reason for referral : CCM-diabetes   Bridget Good is a 69 y.o. year old female who is a primary care patient of Moore, Janece, FNP. The CCM team was consulted for assistance with chronic disease management and care coordination needs related to DMII  Review of patient status, including review of consultants reports, relevant laboratory and other test results, and collaboration with appropriate care team members and the patient's provider was performed as part of comprehensive patient evaluation and provision of chronic care management services.    Patient was seen in clinic today regarding her diabetes and chronic care management  Medications: Outpatient Encounter Medications as of 01/11/2020  Medication Sig  . acetaminophen (TYLENOL) 500 MG tablet Take 1 tablet (500 mg total) by mouth as needed for mild pain.  . amLODipine-benazepril (LOTREL) 10-40 MG capsule TAKE 1 CAPSULE BY MOUTH DAILY.(AM)  . aspirin EC 81 MG tablet One tablet by mouth daily  . blood glucose meter kit and supplies Dispense based on patient and insurance preference. Use up to four times daily as directed. (FOR ICD-10 E10.9, E11.9).  . Blood Glucose Monitoring Suppl (FREESTYLE LITE) DEVI Check blood sugars twice daily  . ciclopirox (PENLAC) 8 % solution Apply topically at bedtime. Apply over nail and surrounding skin. Apply daily over previous coat. Remove weekly with polish remover. (Patient taking differently: Apply 1 application topically at bedtime. Apply over nail and surrounding skin. Apply daily over previous coat. Remove weekly with polish remover.)  . clopidogrel (PLAVIX) 75 MG tablet TAKE ONE TABLET BY MOUTH ONCE DAILY (AM)  . clotrimazole-betamethasone (LOTRISONE) cream Apply to both feet and between toes bid x 4 weeks.  . diclofenac sodium (VOLTAREN) 1 % GEL Apply 2 g  topically 4 (four) times daily.  . empagliflozin (JARDIANCE) 10 MG TABS tablet Take 10 mg by mouth daily.  . escitalopram (LEXAPRO) 5 MG tablet TAKE ONE TABLET BY MOUTH DAILY AT BEDTIME.  . fluticasone (FLONASE) 50 MCG/ACT nasal spray Place into both nostrils daily.  . Ginsengs-Royal Jelly-Vit B12 (GINSENG ROYAL JELLY PLUS) 375-1-15 MG-MG-MCG CAPS Take 1 capsule by mouth daily. (Patient not taking: Reported on 01/11/2020)  . glucose blood test strip Test blood sugars up to 4 times a day  . Lancets (FREESTYLE) lancets Use as instructed  . metoprolol succinate (TOPROL-XL) 50 MG 24 hr tablet TAKE 1 TABLET BY MOUTH DAILY. TAKE WITH OR IMMEDIATELY FOLLOWING A MEAL (AM)  . nitroGLYCERIN (NITROSTAT) 0.4 MG SL tablet Place 1 tablet (0.4 mg total) under the tongue every 5 (five) minutes as needed for chest pain.  . NONFORMULARY OR COMPOUNDED ITEM Antifungal solution: Terbinafine 3%, Fluconazole 2%, Tea Tree Oil 5%, Urea 10%, Ibuprofen 2% in DMSO suspension #30mL  . pantoprazole (PROTONIX) 40 MG tablet Take one tablet by mouth daily in AM  . [EXPIRED] pneumococcal 13-valent conjugate vaccine (PREVNAR 13) SUSP injection Inject 0.5 mLs into the muscle tomorrow at 10 am for 1 dose.  . potassium chloride SA (KLOR-CON) 20 MEQ tablet TAKE ONE TABLET BY MOUTH ONCE DAILY (AM)  . rosuvastatin (CRESTOR) 20 MG tablet TAKE ONE TABLET BY MOUTH ONCE DAILY (BEDTIME)  . [EXPIRED] Tdap (BOOSTRIX) 5-2.5-18.5 LF-MCG/0.5 injection Inject 0.5 mLs into the muscle once for 1 dose.  . Vitamin D, Ergocalciferol, (DRISDOL) 1.25 MG (50000 UT) CAPS capsule TAKE 1 CAPSULE TWICE A WEEK ( MONDAY & THURSDAY)     No facility-administered encounter medications on file as of 01/11/2020.     Objective:   Goals Addressed            This Visit's Progress     Patient Stated   . I would like to continue to control my BGs and keep my A1c<7% (pt-stated)       Current Barriers:   Patient's current BG meter is malfunctioning-->requested new  meter (completed)   Pharmacist Clinical Goal(s):   12/18/19: Over the next 90 days, patient will bring in a new glucometer from [place] and will be able to comfortably self monitor her CBG's GOAL re-established   12/18/2019: Over the next 60 days, patient will have a running log of her BG readings, measuring and recording her readings at least once daily. GOAL re-established   CCM PharmD Interventions: 01/11/2020 clinic visit completed with patient  Discussed plans with patient for ongoing care management follow up and provided patient with direct contact information for care management team  Patient has received new glucometer, however she has been unable to follow up and come to clinic for glucometer training.  Encouraged patient to come to clinic on Monday or Wednesday to set up glucometer.  A1c has increased to 7.9%, patient has been running out of jardiance sampeles and has not reached out for more.  32-monthof samples was provided for patient    Encouraged patient to call for medication support as needed  Will reach out ne  CCM RN CM Interventions:  10/02/19 call completed with patient   . Evaluation of current treatment plan related to Diabetes and patient's adherence to plan as established by provider. . Provided education to patient re: A1C is up to 7.5 from 7.0 obtained on 05/14/19; discussed the target goal for A1C <7.0; reinforced importance of adhering to prescribed DM treatment plan including taking all diabetic medications exactly as prescribed, monitor FBS and follow established protocol for hypo/hyperglycemic episodes, implementing exercise regularly into daily routine, at least 150 minutes weekly, adhere to strictly following a diabetic friendly diet with Meal planning using the plate method; reinforced the importance of lowering A1C in order to help prevent potential complications that may occur as a result of having uncontrolled DM . Reviewed medications with patient  and discussed that patient continues to use her pill package system and reports she is taking all medications exactly as prescribed w/o missed doses  . Collaborated with embedded Pharm D JLottie Dawson regarding patient is not checking her CBG's due to still not feeling comfortable using her glucometer; advised patient agreed to bring her glucometer into the office during her OV on 10/07/19 in order for JAlmyra Freeto instruct patient and her spouse on how to use the glucometer . Discussed plans with patient for ongoing care management follow up and provided patient with direct contact information for care management team . Advised patient, providing education and rationale, to check cbg daily before meals and record, calling the CCM team and or PCP provider for findings outside established parameters.  <80 and or >250   Please see past updates related to this goal by clicking on the "Past Updates" button in the selected goal          Plan:   The care management team will reach out to the patient again over the next 30 days.   Provider Signature JRegina Eck PharmD, BCPS Clinical Pharmacist, [facility] [place] [phone]

## 2020-01-18 ENCOUNTER — Other Ambulatory Visit (HOSPITAL_COMMUNITY)
Admission: RE | Admit: 2020-01-18 | Discharge: 2020-01-18 | Disposition: A | Discharge status: Home / Self Care | Payer: PPO | Source: Ambulatory Visit | Attending: Nurse Practitioner | Admitting: Nurse Practitioner

## 2020-01-18 DIAGNOSIS — Z124 Encounter for screening for malignant neoplasm of cervix: Secondary | ICD-10-CM | POA: Diagnosis not present

## 2020-01-20 LAB — CYTOLOGY - PAP
Adequacy: ABSENT
Diagnosis: NEGATIVE

## 2020-01-23 ENCOUNTER — Other Ambulatory Visit: Payer: PPO

## 2020-01-25 ENCOUNTER — Inpatient Hospital Stay: Payer: PPO | Admitting: Internal Medicine

## 2020-01-25 ENCOUNTER — Inpatient Hospital Stay: Payer: PPO | Attending: Internal Medicine

## 2020-01-25 DIAGNOSIS — R9389 Abnormal findings on diagnostic imaging of other specified body structures: Secondary | ICD-10-CM | POA: Insufficient documentation

## 2020-01-25 DIAGNOSIS — M329 Systemic lupus erythematosus, unspecified: Secondary | ICD-10-CM | POA: Insufficient documentation

## 2020-01-25 DIAGNOSIS — Z90710 Acquired absence of both cervix and uterus: Secondary | ICD-10-CM | POA: Insufficient documentation

## 2020-01-25 DIAGNOSIS — I119 Hypertensive heart disease without heart failure: Secondary | ICD-10-CM | POA: Insufficient documentation

## 2020-01-25 DIAGNOSIS — F329 Major depressive disorder, single episode, unspecified: Secondary | ICD-10-CM | POA: Insufficient documentation

## 2020-01-25 DIAGNOSIS — K219 Gastro-esophageal reflux disease without esophagitis: Secondary | ICD-10-CM | POA: Insufficient documentation

## 2020-01-25 DIAGNOSIS — E785 Hyperlipidemia, unspecified: Secondary | ICD-10-CM | POA: Insufficient documentation

## 2020-01-25 DIAGNOSIS — E119 Type 2 diabetes mellitus without complications: Secondary | ICD-10-CM | POA: Insufficient documentation

## 2020-01-25 DIAGNOSIS — F419 Anxiety disorder, unspecified: Secondary | ICD-10-CM | POA: Insufficient documentation

## 2020-01-25 DIAGNOSIS — Z8673 Personal history of transient ischemic attack (TIA), and cerebral infarction without residual deficits: Secondary | ICD-10-CM | POA: Insufficient documentation

## 2020-01-25 DIAGNOSIS — Z791 Long term (current) use of non-steroidal anti-inflammatories (NSAID): Secondary | ICD-10-CM | POA: Insufficient documentation

## 2020-01-25 DIAGNOSIS — Z79899 Other long term (current) drug therapy: Secondary | ICD-10-CM | POA: Insufficient documentation

## 2020-01-25 DIAGNOSIS — Z833 Family history of diabetes mellitus: Secondary | ICD-10-CM | POA: Insufficient documentation

## 2020-01-25 DIAGNOSIS — R519 Headache, unspecified: Secondary | ICD-10-CM | POA: Insufficient documentation

## 2020-01-25 DIAGNOSIS — Z8249 Family history of ischemic heart disease and other diseases of the circulatory system: Secondary | ICD-10-CM | POA: Insufficient documentation

## 2020-01-25 DIAGNOSIS — Z7982 Long term (current) use of aspirin: Secondary | ICD-10-CM | POA: Insufficient documentation

## 2020-01-26 ENCOUNTER — Other Ambulatory Visit: Payer: Self-pay | Admitting: *Deleted

## 2020-01-28 ENCOUNTER — Telehealth: Payer: Self-pay | Admitting: Internal Medicine

## 2020-01-28 NOTE — Telephone Encounter (Signed)
Scheduled appt per 2/15 sch message - pt aware of appt date and time   

## 2020-02-01 ENCOUNTER — Ambulatory Visit: Payer: PPO | Admitting: Podiatry

## 2020-02-01 ENCOUNTER — Other Ambulatory Visit: Payer: Self-pay | Admitting: *Deleted

## 2020-02-01 ENCOUNTER — Inpatient Hospital Stay (HOSPITAL_BASED_OUTPATIENT_CLINIC_OR_DEPARTMENT_OTHER): Payer: PPO | Admitting: Internal Medicine

## 2020-02-01 ENCOUNTER — Ambulatory Visit: Payer: PPO | Attending: Family

## 2020-02-01 ENCOUNTER — Other Ambulatory Visit: Payer: Self-pay

## 2020-02-01 VITALS — BP 136/67 | HR 68 | Temp 98.2°F | Resp 18 | Ht 66.0 in | Wt 198.8 lb

## 2020-02-01 DIAGNOSIS — R9389 Abnormal findings on diagnostic imaging of other specified body structures: Secondary | ICD-10-CM | POA: Diagnosis not present

## 2020-02-01 DIAGNOSIS — R519 Headache, unspecified: Secondary | ICD-10-CM | POA: Diagnosis not present

## 2020-02-01 DIAGNOSIS — E119 Type 2 diabetes mellitus without complications: Secondary | ICD-10-CM | POA: Diagnosis not present

## 2020-02-01 DIAGNOSIS — Z8673 Personal history of transient ischemic attack (TIA), and cerebral infarction without residual deficits: Secondary | ICD-10-CM | POA: Diagnosis not present

## 2020-02-01 DIAGNOSIS — M898X8 Other specified disorders of bone, other site: Secondary | ICD-10-CM | POA: Diagnosis not present

## 2020-02-01 DIAGNOSIS — Z79899 Other long term (current) drug therapy: Secondary | ICD-10-CM | POA: Diagnosis not present

## 2020-02-01 DIAGNOSIS — Z90710 Acquired absence of both cervix and uterus: Secondary | ICD-10-CM | POA: Diagnosis not present

## 2020-02-01 DIAGNOSIS — F419 Anxiety disorder, unspecified: Secondary | ICD-10-CM | POA: Diagnosis not present

## 2020-02-01 DIAGNOSIS — I119 Hypertensive heart disease without heart failure: Secondary | ICD-10-CM | POA: Diagnosis not present

## 2020-02-01 DIAGNOSIS — Z833 Family history of diabetes mellitus: Secondary | ICD-10-CM | POA: Diagnosis not present

## 2020-02-01 DIAGNOSIS — M329 Systemic lupus erythematosus, unspecified: Secondary | ICD-10-CM | POA: Diagnosis not present

## 2020-02-01 DIAGNOSIS — F329 Major depressive disorder, single episode, unspecified: Secondary | ICD-10-CM | POA: Diagnosis not present

## 2020-02-01 DIAGNOSIS — Z791 Long term (current) use of non-steroidal anti-inflammatories (NSAID): Secondary | ICD-10-CM | POA: Diagnosis not present

## 2020-02-01 DIAGNOSIS — Z7982 Long term (current) use of aspirin: Secondary | ICD-10-CM | POA: Diagnosis not present

## 2020-02-01 DIAGNOSIS — Z23 Encounter for immunization: Secondary | ICD-10-CM | POA: Insufficient documentation

## 2020-02-01 DIAGNOSIS — G9689 Other specified disorders of central nervous system: Secondary | ICD-10-CM

## 2020-02-01 DIAGNOSIS — E785 Hyperlipidemia, unspecified: Secondary | ICD-10-CM | POA: Diagnosis not present

## 2020-02-01 DIAGNOSIS — Z8249 Family history of ischemic heart disease and other diseases of the circulatory system: Secondary | ICD-10-CM | POA: Diagnosis not present

## 2020-02-01 DIAGNOSIS — K219 Gastro-esophageal reflux disease without esophagitis: Secondary | ICD-10-CM | POA: Diagnosis not present

## 2020-02-01 NOTE — Progress Notes (Signed)
[facility] [place], [place] [address] [phone]   Interval Evaluation  Date of Service: 02/01/20 Patient Name: Bridget Good Patient MRN: [id number] Patient DOB: 1951-08-18 Provider: Ventura Sellers, MD  Identifying Statement:  Bridget Good is a 69 y.o. 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 Zada Finders  Interval History:  Bridget Good presents today for follow up after scheduled MRI.  She continues to have occassional headaches. Also describes ongoing mild memory loss, though is functionally not affected. She has not been over-using analgesia, and sleep has been normal.  Otherwise no new or progressive neurologic deficits, no seizures.  Walking is normal and independent.  No further weight loss.    H+P (12/15/18) Patient presents after referral for abnormal brain MRI.  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  There were and are no symptoms clearly associated with the bony mass.  Referral was initially placed for Dr. Walden Field given abnormal light chains found on serology.  Per patient and Dr. Walden Field, further workup has not uncovered myeloma or other related malignancy to this point.  Skeletal survey was also performed and was normal.  Still, patient has not had CT scans of the body, and no CNS imaging in more than 5 months.  She maintains good functional status, walks with a cane because of orthopedic limitations.  Double vision has resolved.  Of related concern is that she describes greater than 100lbs unintentional weight loss over the past year.    Medications: Current Outpatient Medications on File Prior to Visit  Medication Sig Dispense Refill  . acetaminophen (TYLENOL) 500 MG tablet Take 1 tablet (500 mg total) by mouth as needed for mild pain. 90 tablet 1  . amLODipine-benazepril (LOTREL) 10-40 MG capsule TAKE 1 CAPSULE BY MOUTH DAILY.(AM) 90 capsule 1  . aspirin EC 81 MG tablet One tablet by mouth daily 90 tablet 1  . blood glucose meter kit and supplies Dispense based on patient and insurance preference. Use up to four times daily as directed. (FOR ICD-10 E10.9, E11.9). 1 each 0  . Blood Glucose Monitoring Suppl (FREESTYLE LITE) DEVI Check blood sugars twice daily 1 each 1  . ciclopirox (PENLAC) 8 % solution Apply topically at bedtime. Apply over nail and surrounding skin. Apply daily over previous coat. Remove weekly with polish remover. (Patient taking differently: Apply 1 application topically at bedtime. Apply over nail and surrounding skin. Apply daily over previous coat. Remove weekly with polish remover.) 6.6 mL 11  . clopidogrel (PLAVIX) 75 MG tablet TAKE ONE TABLET BY MOUTH ONCE DAILY (AM) 90 tablet 1  . clotrimazole-betamethasone (LOTRISONE) cream Apply to both feet and between toes bid x 4 weeks. 45 g 1  . diclofenac sodium (VOLTAREN) 1 % GEL Apply 2 g topically 4 (four) times daily. 100 g 2  . empagliflozin (JARDIANCE) 10 MG TABS tablet Take 10 mg by mouth daily. 90 tablet 3  . escitalopram (LEXAPRO) 5 MG tablet TAKE ONE TABLET BY MOUTH DAILY AT BEDTIME. 90 tablet 0  . fluticasone (FLONASE) 50 MCG/ACT nasal spray Place into both nostrils daily.    Marland Kitchen  Ginsengs-Royal Jelly-Vit B12 (GINSENG ROYAL JELLY PLUS) 375-1-15 MG-MG-MCG CAPS Take 1 capsule by mouth daily. 90 capsule 1  . glucose blood test strip Test blood sugars up to 4 times a day 100 each 12  . Lancets (FREESTYLE) lancets Use as instructed 100 each 12  . metoprolol succinate (TOPROL-XL) 50 MG 24 hr tablet TAKE 1 TABLET BY MOUTH DAILY. TAKE WITH OR IMMEDIATELY  FOLLOWING A MEAL (AM) 90 tablet 1  . nitroGLYCERIN (NITROSTAT) 0.4 MG SL tablet Place 1 tablet (0.4 mg total) under the tongue every 5 (five) minutes as needed for chest pain. 25 tablet 12  . NONFORMULARY OR COMPOUNDED ITEM Antifungal solution: Terbinafine 3%, Fluconazole 2%, Tea Tree Oil 5%, Urea 10%, Ibuprofen 2% in DMSO suspension #47m 1 each 3  . pantoprazole (PROTONIX) 40 MG tablet Take one tablet by mouth daily in AM 90 tablet 1  . potassium chloride SA (KLOR-CON) 20 MEQ tablet TAKE ONE TABLET BY MOUTH ONCE DAILY (AM) 90 tablet 1  . rosuvastatin (CRESTOR) 20 MG tablet TAKE ONE TABLET BY MOUTH ONCE DAILY (BEDTIME) 90 tablet 1  . Vitamin D, Ergocalciferol, (DRISDOL) 1.25 MG (50000 UT) CAPS capsule TAKE 1 CAPSULE TWICE A WEEK ( MONDAY & THURSDAY) 24 capsule 1   No current facility-administered medications on file prior to visit.    Allergies:  Allergies  Allergen Reactions  . Sulfa Antibiotics Hives, Itching and Nausea And Vomiting   Past Medical History:  Past Medical History:  Diagnosis Date  . Angina   . Anxiety   . Bronchitis   . Depression   . Diabetes mellitus   . Fibromyalgia   . GERD (gastroesophageal reflux disease)   . Headache(784.0)   . Hyperlipidemia   . Hypertension   . Hypertensive heart disease without CHF   . [place]    "treated for it from 1992 til 2012; dr said I don't have it anymore"  . Obesity (BMI 30-39.9)   . Osteoarthritis   . Pneumonia   . Shortness of breath    lying down, upon exertion  . Shortness of breath on exertion   . Stroke ([facility]    2019   Past Surgical History:  Past Surgical History:  Procedure Laterality Date  . ABDOMINAL HYSTERECTOMY     partial  . CARDIAC CATHETERIZATION  ~ 2007  . CESAREAN SECTION  1978; 1981  . COLONOSCOPY    . CRANIOTOMY N/A 02/27/2019   Procedure: Endonasal Endoscopic biopsy of clival mass;  Surgeon: OJudith Part MD;  [facility]  Service: Neurosurgery;  Laterality: N/A;  Endonasal  Endoscopic biopsy of clival mass  . ENDOSCOPIC TRANS NASAL APPROACH WITH FUSION N/A 02/27/2019   Procedure: ENDOSCOPIC TRANS NASAL APPROACH WITH FUSION;  Surgeon: OJudith Part MD;  [facility]  Service: Neurosurgery;  Laterality: N/A;  ENDOSCOPIC TRANS NASAL APPROACH WITH FUSION  . FOOT SURGERY     "had to cut it to let the fluids out; it had swollen very badly; left foot"   Social History:  Social History   Socioeconomic History  . Marital status: Married    Spouse name: Not on file  . Number of children: 2  . Years of education: Not on file  . Highest education level: Not on file  Occupational History  . Occupation: retired  Tobacco Use  . Smoking status: Never Smoker  . Smokeless tobacco: Never Used  Substance and Sexual Activity  . Alcohol use: No    Alcohol/week: 0.0 standard drinks  .  Drug use: No  . Sexual activity: Yes    Partners: Male    Birth control/protection: Surgical  Other Topics Concern  . Not on file  Social History Narrative   Married.  Husband is psychotherapist.  Several children.  Husband is Theme park manager of [employer] in [place].   Social Determinants of Health   Financial Resource Strain: Medium Risk  . Difficulty of Paying Living Expenses: Somewhat hard  Food Insecurity: Food Insecurity Present  . Worried About Charity fundraiser in the Last Year: Sometimes true  . Ran Out of Food in the Last Year: Never true  Transportation Needs: No Transportation Needs  . Lack of Transportation (Medical): No  . Lack of Transportation (Non-Medical): No  Physical Activity:   . Days of Exercise per Week: Not on file  . Minutes of Exercise per Session: Not on file  Stress: Stress Concern Present  . Feeling of Stress : Rather much  Social Connections: Not Isolated  . Frequency of Communication with Friends and Family: More than three times a week  . Frequency of Social Gatherings with Friends and Family: More than three times a week  . Attends  Religious Services: More than 4 times per year  . Active Member of Clubs or Organizations: Yes  . Attends Archivist Meetings: More than 4 times per year  . Marital Status: Married  Human resources officer Violence: Not At Risk  . Fear of Current or Ex-Partner: No  . Emotionally Abused: No  . Physically Abused: No  . Sexually Abused: No   Family History:  Family History  Problem Relation Age of Onset  . Heart attack Father   . Diabetes Father   . Hypertension Mother   . Heart attack Brother   . Kidney failure Brother   . Diabetes Sister   . Diabetes Sister     Review of Systems: Constitutional: Denies fevers, chills or abnormal weight loss Eyes: Denies blurriness of vision Ears, nose, mouth, throat, and face: Denies mucositis or sore throat Respiratory: Denies cough, dyspnea or wheezes Cardiovascular: Denies palpitation, chest discomfort or lower extremity swelling Gastrointestinal:  Denies nausea, constipation, diarrhea GU: Denies dysuria or incontinence Skin: Denies abnormal skin rashes Neurological: Per HPI Musculoskeletal: joint pain Behavioral/Psych: +anxiety  Physical Exam: Vitals:   02/01/20 1138  BP: 136/67  Pulse: 68  Resp: 18  Temp: 98.2 F (36.8 C)  SpO2: 99%   KPS: 80. General: Alert, cooperative, pleasant, in no acute distress Head: Normal EENT: No conjunctival injection or scleral icterus. Oral mucosa moist Lungs: Resp effort normal Cardiac: Regular rate and rhythm Abdomen: Soft, non-distended abdomen Skin: No rashes cyanosis or petechiae. Extremities: No clubbing or edema  Neurologic Exam: Mental Status: Awake, alert, attentive to examiner. Oriented to self and environment. Language is fluent with intact comprehension.  Cranial Nerves: Visual acuity is grossly normal. Visual fields are full. Extra-ocular movements intact. No ptosis. Face is symmetric, tongue midline. Motor: Tone and bulk are normal. Power is full in both arms and legs.  Reflexes are symmetric, no pathologic reflexes present. Intact finger to nose bilaterally Sensory: Intact to light touch and temperature Gait: Normal and tandem gait is normal.   Labs: I have reviewed the data as listed    Component Value Date/Time   NA 139 01/11/2020 1148   K 4.1 01/11/2020 1148   CL 104 01/11/2020 1148   CO2 23 01/11/2020 1148   GLUCOSE 170 (H) 01/11/2020 1148   GLUCOSE 161 (H) 04/17/2019 1023  BUN 16 01/11/2020 1148   CREATININE 1.41 (H) 01/11/2020 1148   CALCIUM 9.8 01/11/2020 1148   PROT 6.5 01/11/2020 1148   ALBUMIN 3.9 01/11/2020 1148   AST 13 01/11/2020 1148   ALT 8 01/11/2020 1148   ALKPHOS 79 01/11/2020 1148   BILITOT 1.2 01/11/2020 1148   GFRNONAA 39 (L) 01/11/2020 1148   GFRAA 45 (L) 01/11/2020 1148   Lab Results  Component Value Date   WBC 3.6 (L) 04/17/2019   NEUTROABS 1.7 04/17/2019   HGB 14.1 04/17/2019   HCT 44.1 04/17/2019   MCV 92.8 04/17/2019   PLT 314 04/17/2019    Assessment/Plan  1. CNS mass  Bridget Good is clinically stable today.  MRI was cancelled due to weather circumstances and will be rescheduled to tomorrow as discussed.  Recommend continued PRN Tylenol for breakthrough headaches.  We will call her on 02/03/20 to review her MRI scan and our brain/spine tumor board discussion.  We appreciate the opportunity to participate in the care of Bridget Good.  She should follow up after brain MRI yearly, or sooner if needed.  All questions were answered. The patient knows to call the clinic with any problems, questions or concerns. No barriers to learning were detected.  The total time spent in the encounter was 30 minutes and more than 50% was on counseling and review of test results   Ventura Sellers, MD Medical Director of Neuro-Oncology [facility] at [place] 02/01/20 11:41 AM

## 2020-02-01 NOTE — Progress Notes (Signed)
   Covid-19 Vaccination Clinic  Name:  Bridget Good    MRN: [id number] DOB: [birth year]  02/01/2020  [name] was observed post Covid-19 immunization for 15 minutes without incidence. She was provided with Vaccine Information Sheet and instruction to access the V-Safe system.   [name] was instructed to call 911 with any severe reactions post vaccine: Marland Kitchen Difficulty breathing  . Swelling of your face and throat  . A fast heartbeat  . A bad rash all over your body  . Dizziness and weakness    Immunizations Administered    Name Date Dose VIS Date Route   Moderna COVID-19 Vaccine 02/01/2020  3:52 PM 0.5 mL 11/10/2019 Intramuscular   Manufacturer: Moderna   Lot: [id number]   [id number]

## 2020-02-02 ENCOUNTER — Telehealth: Payer: Self-pay | Admitting: Internal Medicine

## 2020-02-02 ENCOUNTER — Other Ambulatory Visit: Payer: PPO

## 2020-02-02 NOTE — Telephone Encounter (Signed)
Scheduled appt per 2/22 los.  Spoke with pt and she is aware of the appt date and time.

## 2020-02-04 ENCOUNTER — Ambulatory Visit: Payer: PPO | Admitting: Internal Medicine

## 2020-02-04 ENCOUNTER — Telehealth: Payer: Self-pay

## 2020-02-05 ENCOUNTER — Ambulatory Visit
Admission: RE | Admit: 2020-02-05 | Discharge: 2020-02-05 | Disposition: A | Discharge status: Home / Self Care | Payer: PPO | Source: Ambulatory Visit | Attending: Internal Medicine | Admitting: Internal Medicine

## 2020-02-05 DIAGNOSIS — R413 Other amnesia: Secondary | ICD-10-CM | POA: Diagnosis not present

## 2020-02-05 DIAGNOSIS — G9689 Other specified disorders of central nervous system: Secondary | ICD-10-CM

## 2020-02-05 MED ORDER — GADOBENATE DIMEGLUMINE 529 MG/ML IV SOLN
15.0000 mL | Freq: Once | INTRAVENOUS | Status: AC | PRN
  Administered 2020-02-05: 15 mL via INTRAVENOUS

## 2020-02-08 ENCOUNTER — Telehealth: Payer: Self-pay | Admitting: Internal Medicine

## 2020-02-08 ENCOUNTER — Inpatient Hospital Stay: Payer: PPO | Attending: Internal Medicine | Admitting: Internal Medicine

## 2020-02-08 DIAGNOSIS — G9689 Other specified disorders of central nervous system: Secondary | ICD-10-CM

## 2020-02-08 NOTE — Progress Notes (Signed)
I connected with Bridget Good on 02/08/20 at 10:00 AM EST by telephone visit and verified that I am speaking with the correct person using two identifiers.  I discussed the limitations, risks, security and privacy concerns of performing an evaluation and management service by telemedicine and the availability of in-person appointments. I also discussed with the patient that there may be a patient responsible charge related to this service. The patient expressed understanding and agreed to proceed.  Other persons participating in the visit and their role in the encounter:  Husband  Patient's location:  Home  Provider's location:  Office  Chief Complaint:  Clival mass, granuloma  History of Present Ilness: Called to discuss results of recent MRI brain and tumor board discussion.  No new or recurrent symptoms. Observations: Language and cognition at baseline  Imaging:  [place] Clinician Interpretation: I have personally reviewed the CNS images as listed.  My interpretation, in the context of the patient's clinical presentation, is progressive disease  MR Brain W Wo Contrast  Result Date: 02/06/2020 CLINICAL DATA:  Headaches, blurred vision and short-term memory loss over the last year. Skull base lesion. EXAM: MRI HEAD WITHOUT AND WITH CONTRAST TECHNIQUE: Multiplanar, multiecho pulse sequences of the brain and surrounding structures were obtained without and with intravenous contrast. CONTRAST:  61mL MULTIHANCE GADOBENATE DIMEGLUMINE 529 MG/ML IV SOLN COMPARISON:  01/07/2019.  07/12/2018. FINDINGS: Brain: Diffusion imaging does not show any acute or subacute infarction. Chronic small-vessel ischemic changes affect pons. No focal cerebellar finding. Cerebral hemispheres show old small vessel infarctions of the thalami, basal ganglia and cerebral hemispheric white matter. No cortical or large vessel territory infarction. No intra-axial mass lesion, hemorrhage, hydrocephalus or extra-axial collection.  Vascular: Major vessels at the base of the brain show flow. Skull and upper cervical spine: Slow progressive enlargement of the clival marrow space lesion. No evidence of extraosseous extension. New marrow space replacement seen affecting the C2 vertebral body. Multifocal discrete marrow space lesions remain worrisome for metastatic disease or myeloma. The course of progression is slow over time however. Sinuses/Orbits: Clear/normal Other: None IMPRESSION: Moderate chronic small-vessel ischemic changes throughout the brain. No acute brain finding. Slowly progressive marrow space lesion of the clivus. Newly seen marrow space lesion of the C2 vertebral body. Metastatic disease and myeloma are the primary concern in this patient, though the course of progression is quite slow. Multifocal Paget's disease is possible. Electronically Signed   By: Nelson Chimes M.D.   On: 02/06/2020 01:38    Assessment and Plan: Asymptomatic progression of granuloma within clivus and C2 vertebral body Follow Up Instructions: Will continue to follow radiographically as long as asymptomatic.  Discussed case with Dr. Zada Finders and Vertell Limber.  If decompensation of C2 develops, could necessitate surgical stabilization.  Will plan to repeat MRI and follow up next year.  I discussed the assessment and treatment plan with the patient.  The patient was provided an opportunity to ask questions and all were answered.  The patient agreed with the plan and demonstrated understanding of the instructions.    The patient was advised to call back or seek an in-person evaluation if the symptoms worsen or if the condition fails to improve as anticipated.  I provided 5-10 minutes of non-face-to-face time during this enocunter.  Ventura Sellers, MD   I provided 15 minutes of non face-to-face telephone visit time during this encounter, and > 50% was spent counseling as documented under my assessment & plan.

## 2020-02-08 NOTE — Telephone Encounter (Signed)
Scheduled appt per 3/1 los. ° °Sent a message to HIM pool to get a calendar mailed out. °

## 2020-02-16 ENCOUNTER — Other Ambulatory Visit: Payer: Self-pay | Admitting: Radiation Therapy

## 2020-02-22 ENCOUNTER — Other Ambulatory Visit: Payer: Self-pay | Admitting: Nurse Practitioner

## 2020-02-22 DIAGNOSIS — M25512 Pain in left shoulder: Secondary | ICD-10-CM

## 2020-02-22 DIAGNOSIS — F419 Anxiety disorder, unspecified: Secondary | ICD-10-CM

## 2020-02-22 DIAGNOSIS — F32A Depression, unspecified: Secondary | ICD-10-CM

## 2020-02-22 DIAGNOSIS — F329 Major depressive disorder, single episode, unspecified: Secondary | ICD-10-CM

## 2020-03-08 ENCOUNTER — Other Ambulatory Visit: Payer: Self-pay

## 2020-03-08 ENCOUNTER — Telehealth: Payer: Self-pay

## 2020-03-08 ENCOUNTER — Ambulatory Visit: Payer: Self-pay

## 2020-03-08 DIAGNOSIS — G3184 Mild cognitive impairment, so stated: Secondary | ICD-10-CM

## 2020-03-08 DIAGNOSIS — G9689 Other specified disorders of central nervous system: Secondary | ICD-10-CM

## 2020-03-08 DIAGNOSIS — I10 Essential (primary) hypertension: Secondary | ICD-10-CM

## 2020-03-08 DIAGNOSIS — E119 Type 2 diabetes mellitus without complications: Secondary | ICD-10-CM

## 2020-03-09 ENCOUNTER — Telehealth: Payer: Self-pay

## 2020-03-09 NOTE — Chronic Care Management (AMB) (Signed)
  Chronic Care Management   Outreach Note  03/09/2020 Name: Bridget Good MRN: [id number] DOB: 1951/01/23  Referred by: [name], FNP Reason for referral : Chronic Care Management ([facility] RN Call)   An unsuccessful telephone outreach was attempted today. The patient was referred to the case management team for assistance with care management and care coordination.   Follow Up Plan: Telephone follow up appointment with care management team member scheduled for:03/23/20  Barb Merino, RN, BSN, CCM Care Management Coordinator [facility]  Direct Phone: [phone]

## 2020-03-15 ENCOUNTER — Ambulatory Visit: Payer: PPO | Attending: Family

## 2020-03-15 DIAGNOSIS — Z23 Encounter for immunization: Secondary | ICD-10-CM

## 2020-03-15 NOTE — Progress Notes (Signed)
   Covid-19 Vaccination Clinic  Name:  Bridget Good    MRN: [id number] DOB: [birth year]  03/15/2020  [name] was observed post Covid-19 immunization for 15 minutes without incident. She was provided with Vaccine Information Sheet and instruction to access the V-Safe system.   [name] was instructed to call 911 with any severe reactions post vaccine: Marland Kitchen Difficulty breathing  . Swelling of face and throat  . A fast heartbeat  . A bad rash all over body  . Dizziness and weakness   Immunizations Administered    Name Date Dose VIS Date Route   Moderna COVID-19 Vaccine 03/15/2020 10:44 AM 0.5 mL 11/10/2019 Intramuscular   Manufacturer: Moderna   Lot: [id number]   [id number]

## 2020-03-23 ENCOUNTER — Ambulatory Visit (INDEPENDENT_AMBULATORY_CARE_PROVIDER_SITE_OTHER): Payer: PPO

## 2020-03-23 ENCOUNTER — Other Ambulatory Visit: Payer: Self-pay

## 2020-03-23 ENCOUNTER — Telehealth: Payer: Self-pay

## 2020-03-23 DIAGNOSIS — E119 Type 2 diabetes mellitus without complications: Secondary | ICD-10-CM | POA: Diagnosis not present

## 2020-03-23 DIAGNOSIS — I10 Essential (primary) hypertension: Secondary | ICD-10-CM | POA: Diagnosis not present

## 2020-03-23 DIAGNOSIS — G9689 Other specified disorders of central nervous system: Secondary | ICD-10-CM

## 2020-03-23 DIAGNOSIS — G3184 Mild cognitive impairment, so stated: Secondary | ICD-10-CM

## 2020-03-23 NOTE — Patient Instructions (Signed)
Visit Information  Goals Addressed      Patient Stated   . "I have to use my magnifying glass to see my medicines" (pt-stated)       Current Barriers:  Marland Kitchen Knowledge Deficits related to impaired vision   Nurse Case Manager Clinical Goal(s):  Marland Kitchen Over the next 60 days, patient will attend all scheduled medical appointments: including her eye exam with [place] scheduled for 06/22/19.  Goal Not Met . 08/04/19 Over the next 60 days, patient will follow up with an Eye Specialist to evaluate impaired vision.   CCM RN CM Interventions:  03/23/20 completed call with patient  . Evaluation of current treatment plan related to impaired vision and patient's adherence to plan as established by provider . Determined patient missed her eye appointment, she checked in but the doctor was running behind, she did not stay for the appointment . Determined she did not reschedule the appointment but plans to do so in the near future . Reinforced the importance of adhering to an annual eye exam to evaluate for diabetic retinopathy and or other potential optic complications; provided verbal education related to potential eye/vision complications that may occur secondary to diabetes  . Discussed plans with patient for ongoing care management follow up and provided patient with direct contact information for care management team  Patient Self Care Activities:  . Self administers medications as prescribed . Attends all scheduled provider appointments . Calls pharmacy for medication refills . Performs ADL's independently . Performs IADL's independently . Calls provider office for new concerns or questions  Please see past updates related to this goal by clicking on the "Past Updates" button in the selected goal     . "I would like to improve my kidney function" (pt-stated)       CARE PLAN ENTRY (see longitudinal plan of care for additional care plan information)  Current Barriers:  Marland Kitchen Knowledge  Deficits related to disease process and Self Health management of CKD  . Chronic Disease Management support and education needs related to DM, HTN, CNS mass  Nurse Case Manager Clinical Goal(s):  Marland Kitchen Over the next 90 days, patient will work with the [place] CM and PCP  to address needs related to disease education and support to improve Self Health management of CKD   CCM RN CM Interventions:  . Inter-disciplinary care team collaboration (see longitudinal plan of care) . Evaluation of current treatment plan related to CKD  and patient's adherence to plan as established by provider. . Provided education to patient re: stages of CKD; Educated on patient's current GFR of 45; educated on how to improve renal function by controlling DM and drinking more water . Discussed plans with patient for ongoing care management follow up and provided patient with direct contact information for care management team . Provided patient with printed  educational materials related to Stages of Chronic Kidney Disease; 6 Ways to be Water Wise  Patient Self Care Activities:  . Self administers medications as prescribed . Attends all scheduled provider appointments . Calls pharmacy for medication refills . Performs ADL's independently . Performs IADL's independently . Calls provider office for new concerns or questions  Initial goal documentation     . I would like to continue to control my BGs and keep my A1c<7% (pt-stated)       Current Barriers:   Patient's current BG meter is malfunctioning-->requested new meter (completed)   Pharmacist Clinical Goal(s):   12/18/19: Over the next 90  days, patient will bring in a new glucometer from [place] and will be able to comfortably self monitor her CBG's GOAL re-established   12/18/2019: Over the next 60 days, patient will have a running log of her BG readings, measuring and recording her readings at least once daily. GOAL re-established   CCM PharmD  Interventions: 01/11/2020 clinic visit completed with patient  Discussed plans with patient for ongoing care management follow up and provided patient with direct contact information for care management team  Patient has received new glucometer, however she has been unable to follow up and come to clinic for glucometer training.  Encouraged patient to come to clinic on Monday or Wednesday to set up glucometer.  A1c has increased to 7.9%, patient has been running out of jardiance sampeles and has not reached out for more.  31-monthof samples was provided for patient    Encouraged patient to call for medication support as needed  Will reach out ne  CCM RN CM Interventions:  10/02/19 call completed with patient  . Determined patient is Self monitoring her CBG's but continues to struggle with doing so, she is not checking daily . Determined patient would like to schedule OV to have CMA instruct and demonstrate with her husband how to use the glucometer and he is willing to help her monitor her CBG's . Evaluation of current treatment plan related to Diabetes and patient's adherence to plan as established by provider. . Provided education to patient re: A1C is up to 7.9 from 7.5 obtained on 01/11/20; discussed the target goal for A1C <7.0; reinforced importance of adhering to prescribed DM treatment plan including taking all diabetic medications exactly as prescribed, monitor FBS and follow established protocol for hypo/hyperglycemic episodes, implementing exercise regularly into daily routine, at least 150 minutes weekly, adhere to strictly following a diabetic friendly diet with Meal planning using the plate method; reinforced the importance of lowering A1C in order to help prevent potential complications that may occur as a result of having uncontrolled DM; Educated on daily glycemic control FBS 80-130, <180 after meals . Reviewed medications with patient and discussed that patient continues to use  her pill package system and reports she is taking all medications exactly as prescribed w/o missed doses  . Discussed plans with patient for ongoing care management follow up and provided patient with direct contact information for care management team . Provided patient with printed educational materials related to Diabetes Safety Zone Tool  . Advised patient, providing education and rationale, to check cbg twice daily before meals and record, calling the CCM team and or PCP provider for findings outside established parameters.    Please see past updates related to this goal by clicking on the "Past Updates" button in the selected goal        Patient verbalizes understanding of instructions provided today.   The care management team will reach out to the patient again over the next 14-30 days.   ABarb Merino RN, BSN, CCM Care Management Coordinator [facility]  Direct Phone: [phone]

## 2020-03-23 NOTE — Chronic Care Management (AMB) (Signed)
Chronic Care Management   Follow Up Note   03/23/2020 Name: Bridget Good MRN: [id number] DOB: 05-Apr-1951  Referred by: [name], FNP Reason for referral : Chronic Care Management (FU RN Call )   Bridget Good is a 69 y.o. year old female who is a primary care patient of [name], [place]. The CCM team was consulted for assistance with chronic disease management and care coordination needs.    Review of patient status, including review of consultants reports, relevant laboratory and other test results, and collaboration with appropriate care team members and the patient's provider was performed as part of comprehensive patient evaluation and provision of chronic care management services.    SDOH (Social Determinants of Health) assessments performed: No See Care Plan activities for detailed interventions related to [place])   Placed outbound call to patient for a CCM RN CM follow up.    Outpatient Encounter Medications as of 03/23/2020  Medication Sig  . acetaminophen (TYLENOL) 500 MG tablet Take 1 tablet (500 mg total) by mouth as needed for mild pain.  Marland Kitchen amLODipine-benazepril (LOTREL) 10-40 MG capsule TAKE 1 CAPSULE BY MOUTH DAILY.(AM)  . aspirin EC 81 MG tablet One tablet by mouth daily  . blood glucose meter kit and supplies Dispense based on patient and insurance preference. Use up to four times daily as directed. (FOR ICD-10 E10.9, E11.9).  Marland Kitchen Blood Glucose Monitoring Suppl (FREESTYLE LITE) DEVI Check blood sugars twice daily  . ciclopirox (PENLAC) 8 % solution Apply topically at bedtime. Apply over nail and surrounding skin. Apply daily over previous coat. Remove weekly with polish remover. (Patient taking differently: Apply 1 application topically at bedtime. Apply over nail and surrounding skin. Apply daily over previous coat. Remove weekly with polish remover.)  . clopidogrel (PLAVIX) 75 MG tablet TAKE ONE TABLET BY MOUTH ONCE DAILY (AM)  . clotrimazole-betamethasone  (LOTRISONE) cream Apply to both feet and between toes bid x 4 weeks.  . diclofenac Sodium (VOLTAREN) 1 % GEL APPLY 2 GRAMS TOPICALLY 4 (FOUR) TIMES DAILY.  Marland Kitchen empagliflozin (JARDIANCE) 10 MG TABS tablet Take 10 mg by mouth daily.  Marland Kitchen escitalopram (LEXAPRO) 5 MG tablet TAKE ONE TABLET BY MOUTH DAILY AT BEDTIME.  . fluticasone (FLONASE) 50 MCG/ACT nasal spray Place into both nostrils daily.  [name] Jelly-Vit B12 (GINSENG ROYAL JELLY PLUS) 375-1-15 MG-MG-MCG CAPS Take 1 capsule by mouth daily.  Marland Kitchen glucose blood test strip Test blood sugars up to 4 times a day  . Lancets (FREESTYLE) lancets Use as instructed  . metoprolol succinate (TOPROL-XL) 50 MG 24 hr tablet TAKE 1 TABLET BY MOUTH DAILY. TAKE WITH OR IMMEDIATELY FOLLOWING A MEAL (AM)  . nitroGLYCERIN (NITROSTAT) 0.4 MG SL tablet Place 1 tablet (0.4 mg total) under the tongue every 5 (five) minutes as needed for chest pain.  . NONFORMULARY OR COMPOUNDED ITEM Antifungal solution: Terbinafine 3%, Fluconazole 2%, Tea Tree Oil 5%, Urea 10%, Ibuprofen 2% in DMSO suspension #54m  . pantoprazole (PROTONIX) 40 MG tablet Take one tablet by mouth daily in AM  . potassium chloride SA (KLOR-CON) 20 MEQ tablet TAKE ONE TABLET BY MOUTH ONCE DAILY (AM)  . rosuvastatin (CRESTOR) 20 MG tablet TAKE ONE TABLET BY MOUTH ONCE DAILY (BEDTIME)  . Vitamin D, Ergocalciferol, (DRISDOL) 1.25 MG (50000 UT) CAPS capsule TAKE 1 CAPSULE TWICE A WEEK ( MONDAY & THURSDAY)   No facility-administered encounter medications on file as of 03/23/2020.     Objective:  Lab Results  Component Value Date  HGBA1C 7.9 (H) 01/11/2020   HGBA1C 7.1 (H) 10/07/2019   HGBA1C 7.5 (H) 05/14/2019   Lab Results  Component Value Date   MICROALBUR 150 01/11/2020   LDLCALC 76 01/11/2020   CREATININE 1.41 (H) 01/11/2020   BP Readings from Last 3 Encounters:  02/01/20 136/67  01/11/20 128/80  10/07/19 120/78    Goals Addressed      Patient Stated   . "I have to use my  magnifying glass to see my medicines" (pt-stated)       Current Barriers:  Marland Kitchen Knowledge Deficits related to impaired vision   Nurse Case Manager Clinical Goal(s):  Marland Kitchen Over the next 60 days, patient will attend all scheduled medical appointments: including her eye exam with [facility] scheduled for 06/22/19.  Goal Not Met . 08/04/19 Over the next 60 days, patient will follow up with an Eye Specialist to evaluate impaired vision.   CCM RN CM Interventions:  03/23/20 completed call with patient  . Evaluation of current treatment plan related to impaired vision and patient's adherence to plan as established by provider . Determined patient missed her eye appointment, she checked in but the doctor was running behind, she did not stay for the appointment . Determined she did not reschedule the appointment but plans to do so in the near future . Reinforced the importance of adhering to an annual eye exam to evaluate for diabetic retinopathy and or other potential optic complications; provided verbal education related to potential eye/vision complications that may occur secondary to diabetes  . Discussed plans with patient for ongoing care management follow up and provided patient with direct contact information for care management team  Patient Self Care Activities:  . Self administers medications as prescribed . Attends all scheduled provider appointments . Calls pharmacy for medication refills . Performs ADL's independently . Performs IADL's independently . Calls provider office for new concerns or questions  Please see past updates related to this goal by clicking on the "Past Updates" button in the selected goal      . "I would like to improve my kidney function" (pt-stated)       CARE PLAN ENTRY (see longitudinal plan of care for additional care plan information)  Current Barriers:  Marland Kitchen Knowledge Deficits related to disease process and Self Health management of CKD  . Chronic  Disease Management support and education needs related to DM, HTN, CNS mass  Nurse Case Manager Clinical Goal(s):  Marland Kitchen Over the next 90 days, patient will work with the [place] CM and PCP  to address needs related to disease education and support to improve Self Health management of CKD   CCM RN CM Interventions:  . Inter-disciplinary care team collaboration (see longitudinal plan of care) . Evaluation of current treatment plan related to CKD  and patient's adherence to plan as established by provider. . Provided education to patient re: stages of CKD; Educated on patient's current GFR of 45; educated on how to improve renal function by controlling DM and drinking more water . Discussed plans with patient for ongoing care management follow up and provided patient with direct contact information for care management team . Provided patient with printed  educational materials related to Stages of Chronic Kidney Disease; 6 Ways to be Water Wise  Patient Self Care Activities:  . Self administers medications as prescribed . Attends all scheduled provider appointments . Calls pharmacy for medication refills . Performs ADL's independently . Performs IADL's independently . Calls provider office  for new concerns or questions  Initial goal documentation     . I would like to continue to control my BGs and keep my A1c<7% (pt-stated)       Current Barriers:   Patient's current BG meter is malfunctioning-->requested new meter (completed)   Pharmacist Clinical Goal(s):   12/18/19: Over the next 90 days, patient will bring in a new glucometer from [place] [facility] and will be able to comfortably self monitor her CBG's GOAL re-established   12/18/2019: Over the next 60 days, patient will have a running log of her BG readings, measuring and recording her readings at least once daily. GOAL re-established   CCM PharmD Interventions: 01/11/2020 clinic visit completed with patient  Discussed plans with  patient for ongoing care management follow up and provided patient with direct contact information for care management team  Patient has received new glucometer, however she has been unable to follow up and come to clinic for glucometer training.  Encouraged patient to come to clinic on Monday or Wednesday to set up glucometer.  A1c has increased to 7.9%, patient has been running out of jardiance sampeles and has not reached out for more.  75-monthof samples was provided for patient    Encouraged patient to call for medication support as needed  Will reach out ne  CCM RN CM Interventions:  10/02/19 call completed with patient  . Determined patient is Self monitoring her CBG's but continues to struggle with doing so, she is not checking daily . Determined patient would like to schedule OV to have CMA instruct and demonstrate with her husband how to use the glucometer and he is willing to help her monitor her CBG's . Evaluation of current treatment plan related to Diabetes and patient's adherence to plan as established by provider. . Provided education to patient re: A1C is up to 7.9 from 7.5 obtained on 01/11/20; discussed the target goal for A1C <7.0; reinforced importance of adhering to prescribed DM treatment plan including taking all diabetic medications exactly as prescribed, monitor FBS and follow established protocol for hypo/hyperglycemic episodes, implementing exercise regularly into daily routine, at least 150 minutes weekly, adhere to strictly following a diabetic friendly diet with Meal planning using the plate method; reinforced the importance of lowering A1C in order to help prevent potential complications that may occur as a result of having uncontrolled DM; Educated on daily glycemic control FBS 80-130, <180 after meals . Reviewed medications with patient and discussed that patient continues to use her pill package system and reports she is taking all medications exactly as prescribed  w/o missed doses  . Discussed plans with patient for ongoing care management follow up and provided patient with direct contact information for care management team . Provided patient with printed educational materials related to Diabetes Safety Zone Tool  . Advised patient, providing education and rationale, to check cbg twice daily before meals and record, calling the CCM team and or PCP provider for findings outside established parameters.     Please see past updates related to this goal by clicking on the "Past Updates" button in the selected goal         Plan:   The care management team will reach out to the patient again over the next 14-30 days.   ABarb Merino RN, BSN, CCM Care Management Coordinator [facility]  Direct Phone: [phone]

## 2020-03-30 ENCOUNTER — Other Ambulatory Visit: Payer: Self-pay | Admitting: Nurse Practitioner

## 2020-03-30 DIAGNOSIS — E559 Vitamin D deficiency, unspecified: Secondary | ICD-10-CM

## 2020-04-05 ENCOUNTER — Telehealth: Payer: Self-pay | Admitting: Nurse Practitioner

## 2020-04-05 NOTE — Chronic Care Management (AMB) (Signed)
  Care Management   Note  04/05/2020 Name: KIMIYO GRILLI MRN: [id number] DOB: 07-07-1951  ALIYANA CHIRDON is a 69 y.o. year old female who is a primary care patient of [name], [place] and is actively engaged with the care management team. I reached out to [name] by phone today to assist with scheduling an initial visit with the Pharmacist referred by patients health plan.  Follow up plan: Face to Face appointment with care management team member scheduled for: 04/11/2020.  [place], [place] [address] Direct Dial: [phone] [email or web address]

## 2020-04-11 ENCOUNTER — Ambulatory Visit: Payer: PPO

## 2020-04-11 ENCOUNTER — Ambulatory Visit: Payer: PPO | Admitting: Nurse Practitioner

## 2020-04-11 ENCOUNTER — Other Ambulatory Visit: Payer: Self-pay

## 2020-04-11 DIAGNOSIS — I10 Essential (primary) hypertension: Secondary | ICD-10-CM

## 2020-04-11 DIAGNOSIS — E119 Type 2 diabetes mellitus without complications: Secondary | ICD-10-CM

## 2020-04-11 DIAGNOSIS — E782 Mixed hyperlipidemia: Secondary | ICD-10-CM

## 2020-04-11 NOTE — Chronic Care Management (AMB) (Deleted)
Chronic Care Management Pharmacy  Name: Bridget Good  MRN: [id number] DOB: November 17, 1951  Chief Complaint/ HPI  Bridget Good,  69 y.o. , female presents for their Initial CCM visit with the clinical pharmacist In office.  PCP : [name], FNP  Their chronic conditions include: Diabetes, Hypertension, CAD/Hyperlipidemia, GERD  Office Visits: 01/11/20- OV- Presented for annual exam; diet and exercise education, renal function checked  Consult Visit: 03/23/20- CCM Encounter with RN- Patient education on chronic disease states  02/08/20- Oncology TV w/ Dr Mickeal Skinner- MRI shows progressive disease (asymtpomatic progression of granuloma). Repeat MRI yearly, surgical stabilization will be required if decompensation occurs.   02/01/20- Oncology OV w/ Dr. Mickeal Skinner- Presented for follow up after scheduled MRI; Complained of mild memory loss, and headaches, but clinically stable today; Recommended to continue Tylenol use for breakthrough headaches. Follow up after MRI rescheduled  01/11/20- CCM Encounter w/ Dr. Blanca Friend- Comprehensive medication review, 1 month of Jardiance samples given  12/21/19- CCM Encounter w/ Dr. Blanca Friend- Comprehensive medication review, 1 month of Jardiance samples given  12/18/19- CCM Encounter w/ Dr. Blanca Friend- Encouraged patient to come to clinic for glucometer training, discussed plans for ongoing care management  12/17/19- Neurology OV w/ Dr. Brett Fairy- Follow up on sleep study and memory concerns; sleep has improved on melatonin, No significant obstructive sleep apnea, normal EKD w/ some sinus bradycardia; counseling recommended for depression; recommended SSRI trial with sertraline, melatonin 31m or less, and trazodone as needed  11/03/19- CCM Encounter with Dr. PBlanca Friend Encouraged patient to come to clinic for glucometer training, discussed plans for ongoing care management  11/02/19- Podiatry OV w/ Dr. GElisha Ponder Debridement of toenails, topical mediations prescribed for  onchomycosis. Follow up in 3 months Medications: Outpatient Encounter Medications as of 04/11/2020  Medication Sig  . acetaminophen (TYLENOL) 500 MG tablet Take 1 tablet (500 mg total) by mouth as needed for mild pain.  .Marland KitchenamLODipine-benazepril (LOTREL) 10-40 MG capsule TAKE 1 CAPSULE BY MOUTH DAILY.(AM)  . aspirin EC 81 MG tablet One tablet by mouth daily  . blood glucose meter kit and supplies Dispense based on patient and insurance preference. Use up to four times daily as directed. (FOR ICD-10 E10.9, E11.9).  .Marland KitchenBlood Glucose Monitoring Suppl (FREESTYLE LITE) DEVI Check blood sugars twice daily  . ciclopirox (PENLAC) 8 % solution Apply topically at bedtime. Apply over nail and surrounding skin. Apply daily over previous coat. Remove weekly with polish remover. (Patient taking differently: Apply 1 application topically at bedtime. Apply over nail and surrounding skin. Apply daily over previous coat. Remove weekly with polish remover.)  . clopidogrel (PLAVIX) 75 MG tablet TAKE ONE TABLET BY MOUTH ONCE DAILY (AM)  . clotrimazole-betamethasone (LOTRISONE) cream Apply to both feet and between toes bid x 4 weeks.  . diclofenac Sodium (VOLTAREN) 1 % GEL APPLY 2 GRAMS TOPICALLY 4 (FOUR) TIMES DAILY.  .Marland Kitchenempagliflozin (JARDIANCE) 10 MG TABS tablet Take 10 mg by mouth daily.  .Marland Kitchenescitalopram (LEXAPRO) 5 MG tablet TAKE ONE TABLET BY MOUTH DAILY AT BEDTIME.  . fluticasone (FLONASE) 50 MCG/ACT nasal spray Place into both nostrils daily.  .Jonna ClarkJelly-Vit B12 (GINSENG ROYAL JELLY PLUS) 375-1-15 MG-MG-MCG CAPS Take 1 capsule by mouth daily.  .Marland Kitchenglucose blood test strip Test blood sugars up to 4 times a day  . Lancets (FREESTYLE) lancets Use as instructed  . metoprolol succinate (TOPROL-XL) 50 MG 24 hr tablet TAKE 1 TABLET BY MOUTH DAILY. TAKE WITH OR IMMEDIATELY FOLLOWING A MEAL (AM)  . nitroGLYCERIN (  NITROSTAT) 0.4 MG SL tablet Place 1 tablet (0.4 mg total) under the tongue every 5 (five) minutes as  needed for chest pain.  . NONFORMULARY OR COMPOUNDED ITEM Antifungal solution: Terbinafine 3%, Fluconazole 2%, Tea Tree Oil 5%, Urea 10%, Ibuprofen 2% in DMSO suspension #79m  . pantoprazole (PROTONIX) 40 MG tablet Take one tablet by mouth daily in AM  . potassium chloride SA (KLOR-CON) 20 MEQ tablet TAKE ONE TABLET BY MOUTH ONCE DAILY (AM)  . rosuvastatin (CRESTOR) 20 MG tablet TAKE ONE TABLET BY MOUTH ONCE DAILY (BEDTIME)  . Vitamin D, Ergocalciferol, (DRISDOL) 1.25 MG (50000 UNIT) CAPS capsule TAKE 1 CAPSULE TWICE A WEEK ( MONDAY & THURSDAY)   No facility-administered encounter medications on file as of 04/11/2020.     Current Diagnosis/Assessment:  Goals Addressed   None     Diabetes   Recent Relevant Labs: Lab Results  Component Value Date/Time   HGBA1C 7.9 (H) 01/11/2020 11:48 AM   HGBA1C 7.1 (H) 10/07/2019 02:16 PM   MICROALBUR 150 01/11/2020 11:43 AM   Kidney Function Lab Results  Component Value Date/Time   CREATININE 1.41 (H) 01/11/2020 11:48 AM   CREATININE 1.30 (H) 10/07/2019 02:16 PM   GFRNONAA 39 (L) 01/11/2020 11:48 AM   GFRAA 45 (L) 01/11/2020 11:48 AM  Stage 3a CKD  Checking BG: {CHL HP Blood Glucose Monitoring [phone]}  Recent FBG Readings: Recent pre-meal BG readings: *** Recent 2hr PP BG readings:  *** Recent HS BG readings: *** Patient has failed these meds in past: *** Patient is currently {CHL Controlled/Uncontrolled:[phone]} on the following medications:  -Jardiance 171mdaily  Last diabetic Foot exam: 11/02/19  Last diabetic Eye exam: 06/25/18 Referred for annual eye exam on 02/04/19 Completed?** Lab Results  Component Value Date/Time   HMDIABEYEEXA Retinopathy (A) 06/25/2018 12:00 AM   HMDIABEYEEXA Retinopathy (A) 06/25/2018 12:00 AM    We discussed: {CHL HP Upstream Pharmacy discussion:[id number]}  Plan  Continue {CHL HP Upstream Pharmacy Plans:[phone]},   Hypertension   BP today is:  {CHL HP UPSTREAM Pharmacist  BP ranges:[phone]}  Office blood pressures are  BP Readings from Last 3 Encounters:  02/01/20 136/67  01/11/20 128/80  10/07/19 120/78    Patient has failed these meds in the past: Diltiazem, furosemide, valsartan, valsartan/HCTZ  Patient is currently {CHL Controlled/Uncontrolled:[phone]} on the following medications:  -Amlodipine/benazepril 10/4078maily -Metoprolol succinate 44m60mily with or immediately following a meal -Potassium Chloride SA 20mE39mily (01/11/20- 4.1)  Patient checks BP at home {CHL HP BP Monitoring Frequency:[phone]}  Patient home BP readings are ranging: ***  We discussed {CHL HP Upstream Pharmacy discussion:6134437159}  Plan  Continue {CHL HP Upstream Pharmacy Plans:[phone]}     Hyperlipidemia   Lipid Panel     Component Value Date/Time   CHOL 164 01/11/2020 1148   TRIG 101 01/11/2020 1148   HDL 70 01/11/2020 1148   CHOLHDL 2.3 01/11/2020 1148   CHOLHDL 3.5 07/13/2018 0516   VLDL 20 07/13/2018 0516   LDLCALC 76 01/11/2020 1148   LABVLDL 18 01/11/2020 1148     The ASCVD Risk score (Goff DC Jr., et al., 2013) failed to calculate for the following reasons:   The patient has a prior MI or stroke diagnosis   Patient has failed these meds in past: Simvastatin Patient is currently {CHL Controlled/Uncontrolled:[phone]} on the following medications:  -Rosuvastatin 20mg 65medtime -Aspirin 81mg d17m -Clopidogrel 75mg da33m We discussed:  {CHL HP Upstream Pharmacy discussion:[id number]}  Plan  Continue {CHL HP Upstream Pharmacy Plans:[id number]:[id number]}  Depression   Patient has failed these meds in past: Sertraline (recommended but never taken) Patient is currently {CHL Controlled/Uncontrolled:[phone]} on the following medications:  -Escitalopram 36m at bedtime  We discussed:  {CHL HP Upstream Pharmacy discussion:[phone]}   Plan  Continue {CHL HP Upstream Pharmacy [phone]}  GERD   Patient has failed these  meds in past: *** Patient is currently {CHL Controlled/Uncontrolled:[phone]} on the following medications:  -Pantoprazole 469mdaily  We discussed:  {CHL HP Upstream Pharmacy discussion:[phone]}  Plan  Continue {CHL HP Upstream Pharmacy [phone]}  Vitamin D Deficiency  Last Vitamin D level: 49.5 on 01/11/20 Patient has failed these meds in past: *** Patient is currently {CHL Controlled/Uncontrolled:[phone]} on the following medications:  -Vitamin D2 50,000 units twice weekly (Mondays and Thursdays)  We discussed:  {CHL HP Upstream Pharmacy discussion:[phone]}  Plan  Continue {CHL HP Upstream Pharmacy [phone]}   Over the Counter Medication   Patient is currently on the following medications:  -Acetaminophen 50060mWe discussed:  {CHL HP Upstream Pharmacy discussion:[phone]}  Plan  Continue {CHL HP Upstream Pharmacy [phone]}    Miscellaneous -nitro SL -Antifungal compound -Ciclopirox 8% solution -Ginseng Royal Jelly plus -Fluticasone  -Diclofenac gel 1% -Lotrisone cream apply to both feet and between toes BID x4weeks  Vaccines   Reviewed and discussed patient's vaccination history.    Immunization History  Administered Date(s) Administered  . Influenza Inj Mdck Quad Pf 01/02/2018  . Influenza, High Dose Seasonal PF 09/19/2018  . Influenza,inj,Quad PF,6+ Mos 12/06/2016  . Moderna SARS-COVID-2 Vaccination 02/01/2020, 03/15/2020  . Pneumococcal Polysaccharide-23 11/26/2018    Plan  Recommended patient receive *** vaccine in *** office/pharmacy.  Medication Management  ***Advantaged  Pt uses [facility] pharmacy for all medications Uses pill box? {Yes or If no, why not?:20788} Pt endorses ***% compliance  We discussed: ***  Plan  {US Pharmacy PlaNRWK:83015} Follow up: *** month phone visit  ***

## 2020-04-11 NOTE — Chronic Care Management (AMB) (Signed)
Chronic Care Management Pharmacy  Name: Bridget Good  MRN: [id number] DOB: [birth year]  Chief Complaint/ HPI  [name],  68 y.o. , female presents for their Initial CCM visit with the clinical pharmacist In office.  PCP : [name], FNP  Their chronic conditions include: Diabetes, Hypertension, Hyperlipidemia/CAD, GERD  Office Visits: 01/11/20- OV: Presented for annual exam with diabetes and hypertension follow up. EKG performed (sinus bradycardia left axis anterior fascicular block). Diet and exercise education. Labs ordered (CMP14+EGFR, VitD, UA, HgbA1c lipid panel, TSH). Tdap and Prevnar13 Rxs sent to pharmacy. Bilateral cerumen water lavage. Pap smear performed.    Consult Visit: 03/23/20- CCM Encounter with RN- Patient education on chronic disease states  02/08/20- Oncology TV w/ Dr Mickeal Skinner- MRI shows progressive disease (asymtpomatic progression of granuloma). Follow radiographically while asymptomatic. Repeat MRI yearly, surgical stabilization will be required if decompensation occurs.   02/01/20- Oncology OV w/ Dr. Mickeal Skinner- Presented for follow up after scheduled MRI; Complained of mild memory loss, and headaches, but clinically stable today; Recommended to continue Tylenol use for breakthrough headaches. Follow up after MRI rescheduled  01/11/20- CCM Encounter w/ Dr. Blanca Friend- Comprehensive medication review, 1 month of Jardiance samples given  12/21/19- CCM Encounter w/ Dr. Blanca Friend- Comprehensive medication review, 1 month of Jardiance samples given  12/18/19- CCM Encounter w/ Dr. Blanca Friend- Encouraged patient to come to clinic for glucometer training, discussed plans for ongoing care management  12/17/19- Neurology OV w/ Dr. Brett Fairy- Follow up on sleep study and memory concerns; sleep has improved on melatonin, No significant obstructive sleep apnea, normal EKD w/ some sinus bradycardia; counseling recommended for depression; recommended SSRI trial with sertraline, melatonin  67m or less, and trazodone as needed  11/03/19- CCM Encounter with Dr. PBlanca Friend Encouraged patient to come to clinic for glucometer training, discussed plans for ongoing care management  11/02/19- Podiatry OV w/ Dr. GElisha Ponder Debridement of toenails, topical mediations prescribed for onchomycosis. Follow up in 3 months  Medications: Outpatient Encounter Medications as of 04/11/2020  Medication Sig  . acetaminophen (TYLENOL) 500 MG tablet Take 1 tablet (500 mg total) by mouth as needed for mild pain.  .Marland KitchenamLODipine-benazepril (LOTREL) 10-40 MG capsule TAKE 1 CAPSULE BY MOUTH DAILY.(AM)  . aspirin EC 81 MG tablet One tablet by mouth daily  . blood glucose meter kit and supplies Dispense based on patient and insurance preference. Use up to four times daily as directed. (FOR ICD-10 E10.9, E11.9).  .Marland KitchenBlood Glucose Monitoring Suppl (FREESTYLE LITE) DEVI Check blood sugars twice daily  . ciclopirox (PENLAC) 8 % solution Apply topically at bedtime. Apply over nail and surrounding skin. Apply daily over previous coat. Remove weekly with polish remover. (Patient taking differently: Apply 1 application topically at bedtime. Apply over nail and surrounding skin. Apply daily over previous coat. Remove weekly with polish remover.)  . clopidogrel (PLAVIX) 75 MG tablet TAKE ONE TABLET BY MOUTH ONCE DAILY (AM)  . clotrimazole-betamethasone (LOTRISONE) cream Apply to both feet and between toes bid x 4 weeks.  . diclofenac Sodium (VOLTAREN) 1 % GEL APPLY 2 GRAMS TOPICALLY 4 (FOUR) TIMES DAILY.  .Marland Kitchenempagliflozin (JARDIANCE) 10 MG TABS tablet Take 10 mg by mouth daily.  .Marland Kitchenescitalopram (LEXAPRO) 5 MG tablet TAKE ONE TABLET BY MOUTH DAILY AT BEDTIME.  . fluticasone (FLONASE) 50 MCG/ACT nasal spray Place into both nostrils daily.  .Jonna ClarkJelly-Vit B12 (GINSENG ROYAL JELLY PLUS) 375-1-15 MG-MG-MCG CAPS Take 1 capsule by mouth daily.  .Marland Kitchenglucose blood test strip Test blood  sugars up to 4 times a day  . Lancets  (FREESTYLE) lancets Use as instructed  . metoprolol succinate (TOPROL-XL) 50 MG 24 hr tablet TAKE 1 TABLET BY MOUTH DAILY. TAKE WITH OR IMMEDIATELY FOLLOWING A MEAL (AM)  . nitroGLYCERIN (NITROSTAT) 0.4 MG SL tablet Place 1 tablet (0.4 mg total) under the tongue every 5 (five) minutes as needed for chest pain.  . NONFORMULARY OR COMPOUNDED ITEM Antifungal solution: Terbinafine 3%, Fluconazole 2%, Tea Tree Oil 5%, Urea 10%, Ibuprofen 2% in DMSO suspension #7m  . pantoprazole (PROTONIX) 40 MG tablet Take one tablet by mouth daily in AM  . potassium chloride SA (KLOR-CON) 20 MEQ tablet TAKE ONE TABLET BY MOUTH ONCE DAILY (AM)  . rosuvastatin (CRESTOR) 20 MG tablet TAKE ONE TABLET BY MOUTH ONCE DAILY (BEDTIME)  . Vitamin D, Ergocalciferol, (DRISDOL) 1.25 MG (50000 UNIT) CAPS capsule TAKE 1 CAPSULE TWICE A WEEK ( MONDAY & THURSDAY)   No facility-administered encounter medications on file as of 04/11/2020.   Current Diagnosis/Assessment:  SDOH Interventions     Most Recent Value  SDOH Interventions  SDOH Interventions for the Following Domains  Physical Activity  Physical Activity Interventions  [facility] [employer], Other (Comments) [Encouraged patient to increase physical activity to 10 minutes 5 times weekly. Goal is 30 minutes 5 times weekly]      Goals Addressed            This Visit's Progress   . Initial Care Plan       CARE PLAN ENTRY  Current Barriers:  . Chronic Disease Management support, education, and care coordination needs related to Hypertension, Hyperlipidemia, and Diabetes   Hypertension . Pharmacist Clinical Goal(s): o Over the next 45 days, patient will work with PharmD and providers to maintain BP goal <130/80 . Current regimen:  o Amlodipine/benazepril 10/482mdaily o Metoprolol succinate 509maily with or immediately following a meal . Interventions: o Extensive dietary and exercise recommendations provided . Patient self care activities - Over the next 45 days,  patient will: o Check BP as needed if symptomatic, document, and provide at future appointments o Ensure daily salt intake < 2300 mg/day  Hyperlipidemia . Pharmacist Clinical Goal(s): o Over the next 45 days, patient will work with PharmD and providers to achieve LDL goal < 70 . Current regimen:  o Rosuvastatin 8m35m bedtime . Interventions: o Extensive dietary and exercise recommendations provided . Patient self care activities - Over the next 45 days, patient will: o Increase exercise to 10 minutes 5 times weekly (with eventual goal of 30 minutes 5 times weekly) o Limit eating out to 3 times weekly o Limit the use of sauces and dressings  Diabetes . Pharmacist Clinical Goal(s): o Over the next 45 days, patient will work with PharmD and providers to achieve A1c goal <7% . Current regimen:  o Jardiance 10mg8mly . Interventions: o Encouraged taking medication daily as directed o Extensive dietary and exercise recommendations provided o Samples of Jardiance provided o Will assist in applying for patient assistance if needed . Patient self care activities - Over the next 45 days, patient will: o Check blood sugar once daily, document, and provide at future appointments o Increase exercise to 10 minutes 5 times weekly o Limit carbohydrates (pasta, bread, potatoes, rice, etc) o Switch out lettuce for buns and bread when eating burgers and sandwiches o Limit sauces and dressings o Contact provider with any episodes of hypoglycemia  Medication management . Pharmacist Clinical Goal(s): o Over the  next 45 days, patient will work with PharmD and providers to achieve optimal medication adherence . Current pharmacy: [facility] . Interventions o Continue current medication management strategy . Patient self care activities - Over the next 45 days, patient will: o Take medications as prescribed o Report any questions or concerns to PharmD and/or provider(s)   Initial goal documentation        Diabetes   Recent Relevant Labs: Lab Results  Component Value Date/Time   HGBA1C 7.9 (H) 01/11/2020 11:48 AM   HGBA1C 7.1 (H) 10/07/2019 02:16 PM   MICROALBUR 150 01/11/2020 11:43 AM   Kidney Function Lab Results  Component Value Date/Time   CREATININE 1.41 (H) 01/11/2020 11:48 AM   CREATININE 1.30 (H) 10/07/2019 02:16 PM   GFRNONAA 39 (L) 01/11/2020 11:48 AM   GFRAA 45 (L) 01/11/2020 11:48 AM  Stage 3a CKD  Checking BG: Weekly  Recent FBG Readings: Recent pre-meal BG readings:  Recent 2hr PP BG readings:  Recent HS BG readings:  Patient has failed these meds in past: Glipizide/metformin, glyburide, metformin, Januvia Patient is currently uncontrolled on the following medications:  -Jardiance 25m daily  Last diabetic Foot exam: 11/02/19  Last diabetic Eye exam: 06/25/18 Referred for annual eye exam on 02/04/19 Lab Results  Component Value Date/Time   HMDIABEYEEXA Retinopathy (A) 06/25/2018 12:00 AM   HMDIABEYEEXA Retinopathy (A) 06/25/2018 12:00 AM    We discussed: Diet extensively -Breakfast: Cheerios, toast, no milk (doesn't eat breakfast often) -Lunch: Cookout (twice a week, hamburger or hot dog), salads, [employer] -Dinner: Salad, broccoli, corn, baked chicken w/ BBQ sauce, [employer] -Drinks water mostly with some sweet tea and fruit drinks, but very little soda -Pt sates she is trying to cut down on eating pasta, but husband cooks it and she doesn't want to hurt his feelings -Pt reports currently eating out for meals about 5 times per week -Recommended limiting sauces and dressings which contain a lot of sugar -Recommended reducing eating out to a maximum of 3 times per week -Recommended getting hamburgers and sandwiches wrapped in lettuce instead of on a bun/bread  Exercise extensively -Pt currently walks 2-3 days per week for 10 minutes -States she cannot workout very much due to arthritis -Recommended Silver Sneakers  program and water aerobics at [place] be easier on joints/arthritis -Recommended small weights for resistance training which can be done sitting down -Suggested Youtube for workout programs at home -Goal is the exercise 30 minutes 5 times per week -Pt is going to try to increase exercise to 10 minutes 5 times per week right now  -Pt reported difficulty using her Freestyle meter. She can stick herself, but finds it difficult to coordinate the blood onto the strip and the strip into the meter -Recommended CGM if covered by insurance to reduce problems associated with checking blood sugar -Samples of Jardiance provided  Plan -Continue current medications -Explore covered CGM options -Explore Silver Sneakers as option for patient  Hypertension   Office blood pressures are  BP Readings from Last 3 Encounters:  02/01/20 136/67  01/11/20 128/80  10/07/19 120/78    Patient has failed these meds in the past: Diltiazem, furosemide, valsartan, valsartan/HCTZ Patient is currently controlled on the following medications:  -Amlodipine/benazepril 10/477mdaily -Metoprolol succinate 5016maily with or immediately following a meal -Potassium Chloride SA 32m54maily   Patient checks BP at home infrequently  Patient home BP readings are ranging: N/A  We discussed: -Diet and exercise extensively -  Medication compliance  Plan -Continue current medications    Hyperlipidemia   Lipid Panel     Component Value Date/Time   CHOL 164 01/11/2020 1148   TRIG 101 01/11/2020 1148   HDL 70 01/11/2020 1148   CHOLHDL 2.3 01/11/2020 1148   CHOLHDL 3.5 07/13/2018 0516   VLDL 20 07/13/2018 0516   LDLCALC 76 01/11/2020 1148   LABVLDL 18 01/11/2020 1148     The ASCVD Risk score (Goff DC Jr., et al., 2013) failed to calculate for the following reasons:   The patient has a prior MI or stroke diagnosis   Patient has failed these meds in past: Simvastatin Patient is currently uncontrolled on the  following medications:  -Rosuvastatin 36m at bedtime -Aspirin 832mdaily -Clopidogrel 7574maily  We discussed:  -Diet and exercise extensively  Plan -Continue current medications  Over the Counter Medication   Patient is currently on the following medications:  -Acetaminophen 500m72m needed for pain  We discussed:   -Mass as cause of headaches. Relieved by Tylenol  Plan Continue current medications   Medication Management   Pt uses [facility] for all medications Pt does not endorse 100% compliance. States that she misses medications sometimes because she gets busy and forgets.  We discussed:  -Importance of medication compliance  Plan Continue current medication management strategy  Comprehensive medication review to be performed at follow up visit   Follow up: 6 week phone visit  CourJannette FogoarmD Clinical Pharmacist [facility] [phone]

## 2020-04-15 ENCOUNTER — Telehealth: Payer: Self-pay

## 2020-04-25 NOTE — Patient Instructions (Addendum)
Visit Information  Goals Addressed            This Visit's Progress   . Initial Care Plan       CARE PLAN ENTRY  Current Barriers:  . Chronic Disease Management support, education, and care coordination needs related to Hypertension, Hyperlipidemia, and Diabetes   Hypertension . Pharmacist Clinical Goal(s): o Over the next 45 days, patient will work with PharmD and providers to maintain BP goal <130/80 . Current regimen:  o Amlodipine/benazepril 10/40mg  daily o Metoprolol succinate 50mg  daily with or immediately following a meal . Interventions: o Extensive dietary and exercise recommendations provided . Patient self care activities - Over the next 45 days, patient will: o Check BP as needed if symptomatic, document, and provide at future appointments o Ensure daily salt intake < 2300 mg/day  Hyperlipidemia . Pharmacist Clinical Goal(s): o Over the next 45 days, patient will work with PharmD and providers to achieve LDL goal < 70 . Current regimen:  o Rosuvastatin 20mg  at bedtime . Interventions: o Extensive dietary and exercise recommendations provided . Patient self care activities - Over the next 45 days, patient will: o Increase exercise to 10 minutes 5 times weekly (with eventual goal of 30 minutes 5 times weekly) o Limit eating out to 3 times weekly o Limit the use of sauces and dressings  Diabetes . Pharmacist Clinical Goal(s): o Over the next 45 days, patient will work with PharmD and providers to achieve A1c goal <7% . Current regimen:  o Jardiance 10mg  daily . Interventions: o Encouraged taking medication daily as directed o Extensive dietary and exercise recommendations provided o Samples of Jardiance provided o Will assist in applying for patient assistance if needed . Patient self care activities - Over the next 45 days, patient will: o Check blood sugar once daily, document, and provide at future appointments o Increase exercise to 10 minutes 5 times  weekly o Limit carbohydrates (pasta, bread, potatoes, rice, etc) o Switch out lettuce for buns and bread when eating burgers and sandwiches o Limit sauces and dressings o Contact provider with any episodes of hypoglycemia  Medication management . Pharmacist Clinical Goal(s): o Over the next 45 days, patient will work with PharmD and providers to achieve optimal medication adherence . Current pharmacy: [facility] . Interventions o Continue current medication management strategy . Patient self care activities - Over the next 45 days, patient will: o Take medications as prescribed o Report any questions or concerns to PharmD and/or provider(s)  Initial goal documentation        Bridget Good was given information about Chronic Care Management services today including:  1. CCM service includes personalized support from designated clinical staff supervised by her physician, including individualized plan of care and coordination with other care providers 2. 24/7 contact phone numbers for assistance for urgent and routine care needs. 3. Standard insurance, coinsurance, copays and deductibles apply for chronic care management only during months in which we provide at least 20 minutes of these services. Most insurances cover these services at 100%, however patients may be responsible for any copay, coinsurance and/or deductible if applicable. This service may help you avoid the need for more expensive face-to-face services. 4. Only one practitioner may furnish and bill the service in a calendar month. 5. The patient may stop CCM services at any time (effective at the end of the month) by phone call to the office staff.  Patient agreed to services and verbal consent obtained.  The patient verbalized understanding of instructions provided today and agreed to receive a mailed copy of patient instruction and/or educational materials. Telephone follow up appointment with  pharmacy team member scheduled for: 05/10/20 @ [place], PharmD Clinical Pharmacist [facility] [phone]   Diabetes Mellitus and Nutrition, Adult When you have diabetes (diabetes mellitus), it is very important to have healthy eating habits because your blood sugar (glucose) levels are greatly affected by what you eat and drink. Eating healthy foods in the appropriate amounts, at about the same times every day, can help you:  Control your blood glucose.  Lower your risk of heart disease.  Improve your blood pressure.  Reach or maintain a healthy weight. Every person with diabetes is different, and each person has different needs for a meal plan. Your health care provider may recommend that you work with a diet and nutrition specialist (dietitian) to make a meal plan that is best for you. Your meal plan may vary depending on factors such as:  The calories you need.  The medicines you take.  Your weight.  Your blood glucose, blood pressure, and cholesterol levels.  Your activity level.  Other health conditions you have, such as heart or kidney disease. How do carbohydrates affect me? Carbohydrates, also called carbs, affect your blood glucose level more than any other type of food. Eating carbs naturally raises the amount of glucose in your blood. Carb counting is a method for keeping track of how many carbs you eat. Counting carbs is important to keep your blood glucose at a healthy level, especially if you use insulin or take certain oral diabetes medicines. It is important to know how many carbs you can safely have in each meal. This is different for every person. Your dietitian can help you calculate how many carbs you should have at each meal and for each snack. Foods that contain carbs include:  Bread, cereal, rice, pasta, and crackers.  Potatoes and corn.  Peas, beans, and lentils.  Milk and yogurt.  Fruit and juice.  Desserts,  such as cakes, cookies, ice cream, and candy. How does alcohol affect me? Alcohol can cause a sudden decrease in blood glucose (hypoglycemia), especially if you use insulin or take certain oral diabetes medicines. Hypoglycemia can be a life-threatening condition. Symptoms of hypoglycemia (sleepiness, dizziness, and confusion) are similar to symptoms of having too much alcohol. If your health care provider says that alcohol is safe for you, follow these guidelines:  Limit alcohol intake to no more than 1 drink per day for nonpregnant women and 2 drinks per day for men. One drink equals 12 oz of beer, 5 oz of wine, or 1 oz of hard liquor.  Do not drink on an empty stomach.  Keep yourself hydrated with water, diet soda, or unsweetened iced tea.  Keep in mind that regular soda, juice, and other mixers may contain a lot of sugar and must be counted as carbs. What are tips for following this plan?  Reading food labels  Start by checking the serving size on the "Nutrition Facts" label of packaged foods and drinks. The amount of calories, carbs, fats, and other nutrients listed on the label is based on one serving of the item. Many items contain more than one serving per package.  Check the total grams (g) of carbs in one serving. You can calculate the number of servings of carbs in one serving by dividing the total carbs by 15. For example, if  a food has 30 g of total carbs, it would be equal to 2 servings of carbs.  Check the number of grams (g) of saturated and trans fats in one serving. Choose foods that have low or no amount of these fats.  Check the number of milligrams (mg) of salt (sodium) in one serving. Most people should limit total sodium intake to less than 2,300 mg per day.  Always check the nutrition information of foods labeled as "low-fat" or "nonfat". These foods may be higher in added sugar or refined carbs and should be avoided.  Talk to your dietitian to identify your daily  goals for nutrients listed on the label. Shopping  Avoid buying canned, premade, or processed foods. These foods tend to be high in fat, sodium, and added sugar.  Shop around the outside edge of the grocery store. This includes fresh fruits and vegetables, bulk grains, fresh meats, and fresh dairy. Cooking  Use low-heat cooking methods, such as baking, instead of high-heat cooking methods like deep frying.  Cook using healthy oils, such as olive, canola, or sunflower oil.  Avoid cooking with butter, cream, or high-fat meats. Meal planning  Eat meals and snacks regularly, preferably at the same times every day. Avoid going long periods of time without eating.  Eat foods high in fiber, such as fresh fruits, vegetables, beans, and whole grains. Talk to your dietitian about how many servings of carbs you can eat at each meal.  Eat 4-6 ounces (oz) of lean protein each day, such as lean meat, chicken, fish, eggs, or tofu. One oz of lean protein is equal to: ? 1 oz of meat, chicken, or fish. ? 1 egg. ?  cup of tofu.  Eat some foods each day that contain healthy fats, such as avocado, nuts, seeds, and fish. Lifestyle  Check your blood glucose regularly.  Exercise regularly as told by your health care provider. This may include: ? 150 minutes of moderate-intensity or vigorous-intensity exercise each week. This could be brisk walking, biking, or water aerobics. ? Stretching and doing strength exercises, such as yoga or weightlifting, at least 2 times a week.  Take medicines as told by your health care provider.  Do not use any products that contain nicotine or tobacco, such as cigarettes and e-cigarettes. If you need help quitting, ask your health care provider.  Work with a Social worker or diabetes educator to identify strategies to manage stress and any emotional and social challenges. Questions to ask a health care provider  Do I need to meet with a diabetes educator?  Do I need to  meet with a dietitian?  What number can I call if I have questions?  When are the best times to check my blood glucose? Where to find more information:  American Diabetes Association: diabetes.org  Academy of Nutrition and Dietetics: www.eatright.[employer] of Diabetes and Digestive and Kidney Diseases (NIH): [employer] Summary  A healthy meal plan will help you control your blood glucose and maintain a healthy lifestyle.  Working with a diet and nutrition specialist (dietitian) can help you make a meal plan that is best for you.  Keep in mind that carbohydrates (carbs) and alcohol have immediate effects on your blood glucose levels. It is important to count carbs and to use alcohol carefully. This information is not intended to replace advice given to you by your health care provider. Make sure you discuss any questions you have with your health care provider. Document Revised: 11/08/2017 Document  Reviewed: 12/31/2016 Elsevier Patient Education  [place].  Blood Glucose Monitoring, Adult Monitoring your blood sugar (glucose) is an important part of managing your diabetes (diabetes mellitus). Blood glucose monitoring involves checking your blood glucose as often as directed and keeping a record (log) of your results over time. Checking your blood glucose regularly and keeping a blood glucose log can:  Help you and your health care provider adjust your diabetes management plan as needed, including your medicines or insulin.  Help you understand how food, exercise, illnesses, and medicines affect your blood glucose.  Let you know what your blood glucose is at any time. You can quickly find out if you have low blood glucose (hypoglycemia) or high blood glucose (hyperglycemia). Your health care provider will set individualized treatment goals for you. Your goals will be based on your age, other medical conditions you have, and how you respond to diabetes  treatment. Generally, the goal of treatment is to maintain the following blood glucose levels:  Before meals (preprandial): 80-130 mg/dL (4.4-7.2 mmol/L).  After meals (postprandial): below 180 mg/dL (10 mmol/L).  A1c level: less than 7%. Supplies needed:  Blood glucose meter.  Test strips for your meter. Each meter has its own strips. You must use the strips that came with your meter.  A needle to prick your finger (lancet). Do not use a lancet more than one time.  A device that holds the lancet (lancing device).  A journal or log book to write down your results. How to check your blood glucose  1. Wash your hands with soap and water. 2. Prick the side of your finger (not the tip) with the lancet. Use a different finger each time. 3. Gently rub the finger until a small drop of blood appears. 4. Follow instructions that come with your meter for inserting the test strip, applying blood to the strip, and using your blood glucose meter. 5. Write down your result and any notes. Some meters allow you to use areas of your body other than your finger (alternative sites) to test your blood. The most common alternative sites are:  Forearm.  Thigh.  Palm of the hand. If you think you may have hypoglycemia, or if you have a history of not knowing when your blood glucose is getting low (hypoglycemia unawareness), do not use alternative sites. Use your finger instead. Alternative sites may not be as accurate as the fingers, because blood flow is slower in these areas. This means that the result you get may be delayed, and it may be different from the result that you would get from your finger. Follow these instructions at home: Blood glucose log   Every time you check your blood glucose, write down your result. Also write down any notes about things that may be affecting your blood glucose, such as your diet and exercise for the day. This information can help you and your health care  provider: ? Look for patterns in your blood glucose over time. ? Adjust your diabetes management plan as needed.  Check if your meter allows you to download your records to a computer. Most glucose meters store a record of glucose readings in the meter. If you have type 1 diabetes:  Check your blood glucose 2 or more times a day.  Also check your blood glucose: ? Before every insulin injection. ? Before and after exercise. ? Before meals. ? 2 hours after a meal. ? Occasionally between 2:00 a.m. and 3:00 a.m., as directed. ?  Before potentially dangerous tasks, like driving or using heavy machinery. ? At bedtime.  You may need to check your blood glucose more often, up to 6-10 times a day, if you: ? Use an insulin pump. ? Need multiple daily injections (MDI). ? Have diabetes that is not well-controlled. ? Are ill. ? Have a history of severe hypoglycemia. ? Have hypoglycemia unawareness. If you have type 2 diabetes:  If you take insulin or other diabetes medicines, check your blood glucose 2 or more times a day.  If you are on intensive insulin therapy, check your blood glucose 4 or more times a day. Occasionally, you may also need to check between 2:00 a.m. and 3:00 a.m., as directed.  Also check your blood glucose: ? Before and after exercise. ? Before potentially dangerous tasks, like driving or using heavy machinery.  You may need to check your blood glucose more often if: ? Your medicine is being adjusted. ? Your diabetes is not well-controlled. ? You are ill. General tips  Always keep your supplies with you.  If you have questions or need help, all blood glucose meters have a 24-hour "hotline" phone number that you can call. You may also contact your health care provider.  After you use a few boxes of test strips, adjust (calibrate) your blood glucose meter by following instructions that came with your meter. Contact a health care provider if:  Your blood glucose is  at or above 240 mg/dL (13.3 mmol/L) for 2 days in a row.  You have been sick or have had a fever for 2 days or longer, and you are not getting better.  You have any of the following problems for more than 6 hours: ? You cannot eat or drink. ? You have nausea or vomiting. ? You have diarrhea. Get help right away if:  Your blood glucose is lower than 54 mg/dL (3 mmol/L).  You become confused or you have trouble thinking clearly.  You have difficulty breathing.  You have moderate or large ketone levels in your urine. Summary  Monitoring your blood sugar (glucose) is an important part of managing your diabetes (diabetes mellitus).  Blood glucose monitoring involves checking your blood glucose as often as directed and keeping a record (log) of your results over time.  Your health care provider will set individualized treatment goals for you. Your goals will be based on your age, other medical conditions you have, and how you respond to diabetes treatment.  Every time you check your blood glucose, write down your result. Also write down any notes about things that may be affecting your blood glucose, such as your diet and exercise for the day. This information is not intended to replace advice given to you by your health care provider. Make sure you discuss any questions you have with your health care provider. Document Revised: 09/19/2018 Document Reviewed: 05/07/2016 Elsevier Patient Education  2020 [employer].

## 2020-05-05 ENCOUNTER — Other Ambulatory Visit: Payer: Self-pay | Admitting: Nurse Practitioner

## 2020-05-06 DIAGNOSIS — E119 Type 2 diabetes mellitus without complications: Secondary | ICD-10-CM | POA: Diagnosis not present

## 2020-05-06 DIAGNOSIS — I208 Other forms of angina pectoris: Secondary | ICD-10-CM | POA: Diagnosis not present

## 2020-05-06 DIAGNOSIS — I10 Essential (primary) hypertension: Secondary | ICD-10-CM | POA: Diagnosis not present

## 2020-05-06 DIAGNOSIS — E785 Hyperlipidemia, unspecified: Secondary | ICD-10-CM | POA: Diagnosis not present

## 2020-05-10 ENCOUNTER — Telehealth: Payer: Self-pay

## 2020-05-11 ENCOUNTER — Other Ambulatory Visit: Payer: Self-pay

## 2020-05-11 ENCOUNTER — Ambulatory Visit (INDEPENDENT_AMBULATORY_CARE_PROVIDER_SITE_OTHER): Payer: PPO | Admitting: Nurse Practitioner

## 2020-05-11 ENCOUNTER — Encounter: Payer: Self-pay | Admitting: Nurse Practitioner

## 2020-05-11 VITALS — BP 132/80 | HR 93 | Temp 97.7°F | Ht 66.0 in | Wt 199.6 lb

## 2020-05-11 DIAGNOSIS — I10 Essential (primary) hypertension: Secondary | ICD-10-CM

## 2020-05-11 DIAGNOSIS — E559 Vitamin D deficiency, unspecified: Secondary | ICD-10-CM | POA: Diagnosis not present

## 2020-05-11 DIAGNOSIS — E782 Mixed hyperlipidemia: Secondary | ICD-10-CM | POA: Diagnosis not present

## 2020-05-11 DIAGNOSIS — E119 Type 2 diabetes mellitus without complications: Secondary | ICD-10-CM

## 2020-05-11 NOTE — Progress Notes (Signed)
This visit occurred during the SARS-CoV-2 public health emergency.  Safety protocols were in place, including screening questions prior to the visit, additional usage of staff PPE, and extensive cleaning of exam room while observing appropriate contact time as indicated for disinfecting solutions.  Subjective:     Patient ID: Bridget Good , female    DOB: [birth year] , 69 y.o.   MRN: [id number]   Chief Complaint  Patient presents with  . Diabetes  . Hypertension    HPI  She reports she seen Dr. Terrence Dupont for intermittent chest pain. When she gets upset and angry will race.    Diabetes She presents for her follow-up diabetic visit. She has type 2 diabetes mellitus. Her disease course has been improving. There are no hypoglycemic associated symptoms. Pertinent negatives for hypoglycemia include no confusion, dizziness, headaches or nervousness/anxiousness. There are no diabetic associated symptoms. Pertinent negatives for diabetes include no chest pain, no fatigue and no weakness. There are no hypoglycemic complications. Symptoms are stable. There are no diabetic complications. Risk factors for coronary artery disease include post-menopausal, hypertension, sedentary lifestyle and obesity. Current diabetic treatment includes oral agent (dual therapy). She is compliant with treatment most of the time. She has not had a previous visit with a dietitian. She rarely participates in exercise. (She has not been checking her blood sugar due to not knowing how to use the machine) Eye exam is not current.  Hypertension This is a chronic problem. The current episode started more than 1 year ago. The problem is unchanged. The problem is controlled. Pertinent negatives include no anxiety, chest pain, headaches, palpitations or shortness of breath. Risk factors for coronary artery disease include sedentary lifestyle, obesity and diabetes mellitus. There are no compliance problems.  There is no history of  angina. There is no history of chronic renal disease.     Past Medical History:  Diagnosis Date  . Angina   . Anxiety   . Bronchitis   . Depression   . Diabetes mellitus   . Fibromyalgia   . GERD (gastroesophageal reflux disease)   . Headache(784.0)   . Hyperlipidemia   . Hypertension   . Hypertensive heart disease without CHF   . Lupus ([place])    "treated for it from 1992 til 2012; dr said I don't have it anymore"  . Obesity (BMI 30-39.9)   . Osteoarthritis   . Pneumonia   . Shortness of breath    lying down, upon exertion  . Shortness of breath on exertion   . Stroke [facility])    2019     Family History  Problem Relation Age of Onset  . Heart attack Father   . Diabetes Father   . Hypertension Mother   . Heart attack Brother   . Kidney failure Brother   . Diabetes Sister   . Diabetes Sister      Current Outpatient Medications:  .  acetaminophen (TYLENOL) 500 MG tablet, Take 1 tablet (500 mg total) by mouth as needed for mild pain., Disp: 90 tablet, Rfl: 1 .  aspirin EC 81 MG tablet, One tablet by mouth daily, Disp: 90 tablet, Rfl: 1 .  blood glucose meter kit and supplies, Dispense based on patient and insurance preference. Use up to four times daily as directed. (FOR ICD-10 E10.9, E11.9)., Disp: 1 each, Rfl: 0 .  Blood Glucose Monitoring Suppl (FREESTYLE LITE) DEVI, Check blood sugars twice daily, Disp: 1 each, Rfl: 1 .  ciclopirox (PENLAC) 8 % solution, Apply topically  at bedtime. Apply over nail and surrounding skin. Apply daily over previous coat. Remove weekly with polish remover. (Patient taking differently: Apply 1 application topically at bedtime. Apply over nail and surrounding skin. Apply daily over previous coat. Remove weekly with polish remover.), Disp: 6.6 mL, Rfl: 11 .  clopidogrel (PLAVIX) 75 MG tablet, TAKE ONE TABLET BY MOUTH ONCE DAILY (AM), Disp: 90 tablet, Rfl: 1 .  diclofenac Sodium (VOLTAREN) 1 % GEL, APPLY 2 GRAMS TOPICALLY 4 (FOUR) TIMES DAILY.,  Disp: 100 g, Rfl: 2 .  empagliflozin (JARDIANCE) 10 MG TABS tablet, Take 10 mg by mouth daily., Disp: 90 tablet, Rfl: 3 .  Ginsengs-Royal Jelly-Vit B12 (GINSENG ROYAL JELLY PLUS) 375-1-15 MG-MG-MCG CAPS, Take 1 capsule by mouth daily., Disp: 90 capsule, Rfl: 1 .  glucose blood test strip, Test blood sugars up to 4 times a day, Disp: 100 each, Rfl: 12 .  Lancets (FREESTYLE) lancets, Use as instructed, Disp: 100 each, Rfl: 12 .  metoprolol succinate (TOPROL-XL) 50 MG 24 hr tablet, TAKE 1 TABLET BY MOUTH DAILY. TAKE WITH OR IMMEDIATELY FOLLOWING A MEAL (AM), Disp: 90 tablet, Rfl: 1 .  nitroGLYCERIN (NITROSTAT) 0.4 MG SL tablet, Place 1 tablet (0.4 mg total) under the tongue every 5 (five) minutes as needed for chest pain., Disp: 25 tablet, Rfl: 12 .  NONFORMULARY OR COMPOUNDED ITEM, Antifungal solution: Terbinafine 3%, Fluconazole 2%, Tea Tree Oil 5%, Urea 10%, Ibuprofen 2% in DMSO suspension #16m, Disp: 1 each, Rfl: 3 .  potassium chloride SA (KLOR-CON) 20 MEQ tablet, TAKE ONE TABLET BY MOUTH ONCE DAILY (AM), Disp: 90 tablet, Rfl: 1 .  rosuvastatin (CRESTOR) 20 MG tablet, TAKE ONE TABLET BY MOUTH ONCE DAILY (BEDTIME), Disp: 90 tablet, Rfl: 1 .  Vitamin D, Ergocalciferol, (DRISDOL) 1.25 MG (50000 UNIT) CAPS capsule, TAKE 1 CAPSULE TWICE A WEEK ( MONDAY & THURSDAY), Disp: 24 capsule, Rfl: 1 .  amLODipine-benazepril (LOTREL) 10-40 MG capsule, TAKE 1 CAPSULE BY MOUTH DAILY.(AM) (Patient not taking: Reported on 05/11/2020), Disp: 90 capsule, Rfl: 1 .  clotrimazole-betamethasone (LOTRISONE) cream, Apply to both feet and between toes bid x 4 weeks. (Patient not taking: Reported on 05/11/2020), Disp: 45 g, Rfl: 1 .  escitalopram (LEXAPRO) 5 MG tablet, TAKE ONE TABLET BY MOUTH DAILY AT BEDTIME. (Patient not taking: Reported on 05/11/2020), Disp: 90 tablet, Rfl: 0 .  fluticasone (FLONASE) 50 MCG/ACT nasal spray, Place into both nostrils daily., Disp: , Rfl:  .  pantoprazole (PROTONIX) 40 MG tablet, TAKE ONE TABLET BY  MOUTH DAILY IN THE MORNING. (Patient not taking: Reported on 05/11/2020), Disp: 90 tablet, Rfl: 1   Allergies  Allergen Reactions  . Sulfa Antibiotics Hives, Itching and Nausea And Vomiting     Review of Systems  Constitutional: Negative.  Negative for fatigue.  Eyes: Negative for visual disturbance.  Respiratory: Negative.  Negative for shortness of breath.   Cardiovascular: Negative.  Negative for chest pain, palpitations and leg swelling.  Gastrointestinal: Negative.   Endocrine: Negative.   Musculoskeletal: Negative.   Skin: Negative.   Neurological: Negative for dizziness, weakness and headaches.  Psychiatric/Behavioral: Negative for confusion. The patient is not nervous/anxious.      Today's Vitals   05/11/20 1050  BP: 132/80  Pulse: 93  Temp: 97.7 F (36.5 C)  TempSrc: Oral  SpO2: 96%  Weight: 199 lb 9.6 oz (90.5 kg)  Height: '5\' 6"'  (1.676 m)  PainSc: 0-No pain   Body mass index is 32.22 kg/m.   Objective:  Physical Exam Constitutional:  General: She is not in acute distress.    Appearance: Normal appearance. She is well-developed. She is obese.  Cardiovascular:     Rate and Rhythm: Normal rate and regular rhythm.     Pulses: Normal pulses.     Heart sounds: Normal heart sounds. No murmur.  Pulmonary:     Effort: Pulmonary effort is normal.     Breath sounds: Normal breath sounds.  Chest:     Chest wall: No tenderness.  Musculoskeletal:        General: Normal range of motion.  Skin:    General: Skin is warm and dry.     Capillary Refill: Capillary refill takes less than 2 seconds.  Neurological:     General: No focal deficit present.     Mental Status: She is alert and oriented to person, place, and time.  Psychiatric:        Mood and Affect: Mood normal.        Behavior: Behavior normal.        Thought Content: Thought content normal.        Judgment: Judgment normal.         Assessment And Plan:     1. Type 2 diabetes mellitus without  complication, without long-term current use of insulin (HCC)  Chronic, she is doing well with her medications according to the patient  Will check HgbA1c.  - CMP14+EGFR - Hemoglobin A1c  2. Essential hypertension . B/P is fairly controlled.  . CMP ordered to check renal function.  . The importance of regular exercise and dietary modification was stressed to the patient.  - CMP14+EGFR  3. Vitamin D deficiency  Will check vitamin D level and supplement as needed.     Also encouraged to spend 15 minutes in the sun daily.   4. Mixed hyperlipidemia  Chronic, controlled  Continue with current medications  Advised to avoid fried and fatty foods - Lipid panel   [name], FNP    THE PATIENT IS ENCOURAGED TO PRACTICE SOCIAL DISTANCING DUE TO THE COVID-19 PANDEMIC.

## 2020-05-16 ENCOUNTER — Telehealth: Payer: Self-pay

## 2020-05-31 ENCOUNTER — Ambulatory Visit: Payer: Self-pay

## 2020-05-31 ENCOUNTER — Other Ambulatory Visit: Payer: Self-pay

## 2020-05-31 DIAGNOSIS — I10 Essential (primary) hypertension: Secondary | ICD-10-CM

## 2020-05-31 DIAGNOSIS — E119 Type 2 diabetes mellitus without complications: Secondary | ICD-10-CM

## 2020-05-31 DIAGNOSIS — E782 Mixed hyperlipidemia: Secondary | ICD-10-CM

## 2020-05-31 NOTE — Chronic Care Management (AMB) (Signed)
Chronic Care Management Pharmacy  Name: Bridget Good  MRN: [id number] DOB: [birth year]  Chief Complaint/ HPI  Bridget Good,  69 y.o. , female presents for their Follow-Up CCM visit with the clinical pharmacist via telephone.  PCP : Bridget Brine, FNP  Their chronic conditions include: Diabetes, Hypertension, Hyperlipidemia/CAD, GERD  Office Visits: 05/11/20 OV: Presented for DM and HTN follow up. Pt reported seeing Dr. Terrence Good for intermittent chest pain (when she gets upset.angry heart races). Pt reports doing well on medications for DM. Labs ordered (CMP14+EGFR, HgbA1c, Vitamin D, Lipid panel). BP fairly controlled.   01/11/20 OV: Presented for annual exam with diabetes and hypertension follow up. EKG performed (sinus bradycardia left axis anterior fascicular block). Diet and exercise education. Labs ordered (CMP14+EGFR, VitD, UA, HgbA1c lipid panel, TSH). Tdap and Prevnar13 Rxs sent to pharmacy. Bilateral cerumen water lavage. Pap smear performed.    Consult Visits: 02/08/20 Oncology TV w/ Dr Bridget Good: MRI shows progressive disease (asymptomatic progression of granuloma). Follow radiographically while asymptomatic. Repeat MRI yearly, surgical stabilization will be required if decompensation occurs.   02/01/20 Oncology OV w/ Dr. Mickeal Good: Presented for follow up after scheduled MRI; Complained of mild memory loss, and headaches, but clinically stable today; Recommended to continue Tylenol use for breakthrough headaches. Follow up after MRI rescheduled  12/17/19 Neurology OV w/ Dr. Brett Good: Follow up on sleep study and memory concerns; sleep has improved on melatonin, No significant obstructive sleep apnea, normal EKG w/ some sinus bradycardia; counseling recommended for depression; recommended SSRI trial with sertraline, melatonin 73m or less, and trazodone as needed  11/02/19 Podiatry OV w/ Dr. GElisha Good Debridement of toenails, topical mediations prescribed for onychomycosis. Follow up  in 3 months  CCM Encounters: 03/23/20 RN: Patient education on chronic disease states  01/11/20 PharmD: Comprehensive medication review, 1 month of Jardiance samples given  12/21/19 PharmD: Comprehensive medication review, 1 month of Jardiance samples given  12/18/19 PharmD: Encouraged patient to come to clinic for glucometer training, discussed plans for ongoing care management  11/03/19 PharmD: Encouraged patient to come to clinic for glucometer training, discussed plans for ongoing care management  Medications: Outpatient Encounter Medications as of 05/31/2020  Medication Sig  . acetaminophen (TYLENOL) 500 MG tablet Take 1 tablet (500 mg total) by mouth as needed for mild pain.  .Marland KitchenamLODipine-benazepril (LOTREL) 10-40 MG capsule TAKE 1 CAPSULE BY MOUTH DAILY.(AM)  . aspirin EC 81 MG tablet One tablet by mouth daily  . blood glucose meter kit and supplies Dispense based on patient and insurance preference. Use up to four times daily as directed. (FOR ICD-10 E10.9, E11.9).  .Marland KitchenBlood Glucose Monitoring Suppl (FREESTYLE LITE) DEVI Check blood sugars twice daily  . clopidogrel (PLAVIX) 75 MG tablet TAKE ONE TABLET BY MOUTH ONCE DAILY (AM)  . diclofenac Sodium (VOLTAREN) 1 % GEL APPLY 2 GRAMS TOPICALLY 4 (FOUR) TIMES DAILY.  .Marland Kitchenempagliflozin (JARDIANCE) 10 MG TABS tablet Take 10 mg by mouth daily.  .Marland Kitchenescitalopram (LEXAPRO) 5 MG tablet TAKE ONE TABLET BY MOUTH DAILY AT BEDTIME.  .Marland Kitchenglucose blood test strip Test blood sugars up to 4 times a day  . Lancets (FREESTYLE) lancets Use as instructed  . metoprolol succinate (TOPROL-XL) 50 MG 24 hr tablet TAKE 1 TABLET BY MOUTH DAILY. TAKE WITH OR IMMEDIATELY FOLLOWING A MEAL (AM)  . nitroGLYCERIN (NITROSTAT) 0.4 MG SL tablet Place 1 tablet (0.4 mg total) under the tongue every 5 (five) minutes as needed for chest pain.  . pantoprazole (PROTONIX) 40 MG  tablet TAKE ONE TABLET BY MOUTH DAILY IN THE MORNING.  Marland Kitchen potassium chloride SA (KLOR-CON) 20 MEQ tablet TAKE  ONE TABLET BY MOUTH ONCE DAILY (AM)  . rosuvastatin (CRESTOR) 20 MG tablet TAKE ONE TABLET BY MOUTH ONCE DAILY (BEDTIME)  . Vitamin D, Ergocalciferol, (DRISDOL) 1.25 MG (50000 UNIT) CAPS capsule TAKE 1 CAPSULE TWICE A WEEK ( MONDAY & THURSDAY)  . [DISCONTINUED] diclofenac Sodium (VOLTAREN) 1 % GEL APPLY 2 GRAMS TOPICALLY 4 (FOUR) TIMES DAILY.  . [DISCONTINUED] escitalopram (LEXAPRO) 5 MG tablet TAKE ONE TABLET BY MOUTH DAILY AT BEDTIME.  . ciclopirox (PENLAC) 8 % solution Apply topically at bedtime. Apply over nail and surrounding skin. Apply daily over previous coat. Remove weekly with polish remover. (Patient not taking: Reported on 05/31/2020)  . clotrimazole-betamethasone (LOTRISONE) cream Apply to both feet and between toes bid x 4 weeks. (Patient not taking: Reported on 05/11/2020)  . fluticasone (FLONASE) 50 MCG/ACT nasal spray Place into both nostrils daily. (Patient not taking: Reported on 05/31/2020)  . Ginsengs-Royal Jelly-Vit B12 (GINSENG ROYAL JELLY PLUS) 375-1-15 MG-MG-MCG CAPS Take 1 capsule by mouth daily. (Patient not taking: Reported on 05/31/2020)  . NONFORMULARY OR COMPOUNDED ITEM Antifungal solution: Terbinafine 3%, Fluconazole 2%, Tea Tree Oil 5%, Urea 10%, Ibuprofen 2% in DMSO suspension #78m (Patient not taking: Reported on 05/31/2020)   No facility-administered encounter medications on file as of 05/31/2020.   Current Diagnosis/Assessment:  SDOH Interventions     Most Recent Value  SDOH Interventions  Financial Strain Interventions Other (Comment)  [Will assist patient in applying for Jardiance patient assistance through [employer]           This Visit's Progress   . Pharmacy Care Plan       CARE PLAN ENTRY  Current Barriers:  . Chronic Disease Management support, education, and care coordination needs related to Hypertension, Hyperlipidemia, and Diabetes   Hypertension . Pharmacist Clinical Goal(s): o Over the next 90 days, patient will  work with PharmD and providers to maintain BP goal <130/80 . Current regimen:  o Amlodipine/benazepril 10/449mdaily o Metoprolol succinate 5069maily with or immediately following a meal . Interventions: o Extensive dietary and exercise recommendations provided o Advised patient that I will assist with using home blood pressure monitor if she will come into the office . Patient self care activities - Over the next 90 days, patient will: o Check BP as needed if symptomatic, document, and provide at future appointments o Ensure daily salt intake < 2300 mg/day  Hyperlipidemia . Pharmacist Clinical Goal(s): o Over the next 90 days, patient will work with PharmD and providers to achieve LDL goal < 70 . Current regimen:  o Rosuvastatin 65m40m bedtime . Interventions: o Extensive dietary and exercise recommendations provided o Monitor next lipid panel to determine appropriateness of statin regimen . Patient self care activities - Over the next 90 days, patient will: o Increase exercise to 10 minutes 5 times weekly (with eventual goal of 30 minutes 5 times weekly) o Limit eating out to 3 times weekly o Limit the use of sauces and dressings  Diabetes . Pharmacist Clinical Goal(s): o Over the next 90 days, patient will work with PharmD and providers to achieve A1c goal <7% . Current regimen:  o Jardiance 10mg64mly . Interventions: o Encouraged taking medication daily as directed o Extensive dietary and exercise recommendations provided o Samples of Jardiance available at the office o Will assist in applying for  patient assistance for Jardiance through [employer] (patient needs to provide proof of income documents) o Counseled patient on use of blood glucose monitor and how to obtain more blood when pricking finger o Advised patient I will assist with using blood glucose meter if she will come into the office o Contacted [employer] and determined that patient is eligible for  Silver Sneakers benefit, she just needs to call the activate it. Notified patient o Not eligible for CGM due to not being on insulin . Patient self care activities - Over the next 45 days, patient will: o Check blood sugar once daily, document, and provide at future appointments o Massage finger from base to tip after pricking it to obtain a larger droplet of blood o Increase exercise to 10 minutes 5 times weekly o Limit carbohydrates (pasta, bread, potatoes, rice, etc) o Switch out lettuce for buns and bread when eating burgers and sandwiches o Limit sauces and dressings o Contact provider with any episodes of hypoglycemia o Provide proof of income documents for Jardiance patient assistance program  Medication management . Pharmacist Clinical Goal(s): o Over the next 45 days, patient will work with PharmD and providers to achieve optimal medication adherence . Current pharmacy: [facility] . Interventions o Comprehensive medication review performed o Utilize [facility] for medication synchronization, packaging and delivery o Provided education regarding proper use of diclofenac/Voltaren and advised patient that these are the same medication o Instructed patient to follow up with podiatry for refills of antifungal medications for feet . Patient self care activities - Over the next 45 days, patient will: o Take medications as prescribed o Report any questions or concerns to PharmD and/or provider(s)  Please see past updates related to this goal by clicking on the "Past Updates" button in the selected goal         Diabetes   Recent Relevant Labs: Lab Results  Component Value Date/Time   HGBA1C 7.9 (H) 01/11/2020 11:48 AM   HGBA1C 7.1 (H) 10/07/2019 02:16 PM   MICROALBUR 150 01/11/2020 11:43 AM   Kidney Function Lab Results  Component Value Date/Time   CREATININE 1.41 (H) 01/11/2020 11:48 AM   CREATININE 1.30 (H) 10/07/2019 02:16 PM   GFRNONAA  39 (L) 01/11/2020 11:48 AM   GFRAA 45 (L) 01/11/2020 11:48 AM  Stage 3a CKD  Checking BG: Weekly  Recent FBG Readings: Recent pre-meal BG readings:  Recent 2hr PP BG readings:  Recent HS BG readings:  Patient has failed these meds in past: Glipizide/metformin, glyburide, metformin, Januvia Patient is currently uncontrolled on the following medications:   Jardiance 9m daily  Last diabetic Foot exam: 11/02/19 Last diabetic Eye exam: 06/25/18 Referred for annual eye exam on 02/04/19 Lab Results  Component Value Date/Time   HMDIABEYEEXA Retinopathy (A) 06/25/2018 12:00 AM   HMDIABEYEEXA Retinopathy (A) 06/25/2018 12:00 AM    We discussed: Diet extensively at previous visit  Pt does not report having a great appetite Exercise extensively  Pt is eligible for Silver Sneakers through insurance ([employer]). She just needs to call to activate  Pt is not eligible for CGM due to not using insulin  Samples of Jardiance available at the office for pt to pick up  [place] me regarding her annual income and provide proof of income  Pt mentioned having difficulty using BG meter. Advised pt that I show her how to use the meter if she will come into  the office  Pt stated that she is not able to get much blood out of her fingers and that is part of the reason she has trouble with the meter  Advised pt to massage her finger from base to tip after pricking, which will allow her to get a bigger droplet of blood  Recommend pt check BG every day if possible and at least a few times a week  Plan -Continue current medications  -Assist pt with applying for Jardiance patient assistance program through [facility] cares  Hypertension   Office blood pressures are  BP Readings from Last 3 Encounters:  06/14/20 127/86  05/11/20 132/80  02/01/20 136/67    Patient has failed these meds in the past: Diltiazem, furosemide, valsartan,  valsartan/HCTZ Patient is currently controlled on the following medications:   Amlodipine/benazepril 10/33m daily  Metoprolol succinate 570mdaily with or immediately following a meal  Potassium Chloride SA 2056mdaily   Patient checks BP at home infrequently  Patient home BP readings are ranging: N/A  We discussed:  Diet and exercise extensively  Medication compliance  Pt states that she is not good at checking BP at home  Advised pt that I could assist with using the BP monitor if she will come into the office  Plan -Continue current medications    Hyperlipidemia   Lipid Panel     Component Value Date/Time   CHOL 164 01/11/2020 1148   TRIG 101 01/11/2020 1148   HDL 70 01/11/2020 1148   CHOLHDL 2.3 01/11/2020 1148   CHOLHDL 3.5 07/13/2018 0516   VLDL 20 07/13/2018 0516   LDLCALC 76 01/11/2020 1148   LABVLDL 18 01/11/2020 1148     The ASCVD Risk score (Goff DC Jr., et al., 2013) failed to calculate for the following reasons:   The patient has a prior MI or stroke diagnosis   Patient has failed these meds in past: Simvastatin Patient is currently uncontrolled on the following medications:   Rosuvastatin 54m23m bedtime  Aspirin 81mg46mly  Clopidogrel 75mg 51my  We discussed:   Diet and exercise extensively  Plan -Continue current medications  -Monitor lipid panel to determine appropriateness of statin regimen  Pain   Patient is currently on the following medications:   Acetaminophen 500mg a105meded for pain  Diclofenac 1% gel apply 2 grams topically 4 times daily  We discussed:    Pt was not sure she could take Tylenol with her other medications  Advised pt that there were no drug interactions between Tylenol and her other medications and it is safe for her to take as directed.  Pt reports she has been using diclofenac on her feet because she thought it was for fungus  Advised pt that diclofenac is an anti-inflammatory medication that is  used for pain  Pt is going to start using this on her knees  Pt also reports that she has been buying Voltaren gel OTC and using it on her knees  Explained that diclofenac and Voltaren are the same medication and she does not need to buy OTC Voltaren when she has been getting diclofenac through her insurance at the pharmacy  Plan Continue current medications   Fungal infection on feet   Patient is currently uncontrolled on the following medications:   Ciclopirox 8% solution apply over nail and surrounding skin at bedtime each day. Remove  Weekly with polish remover  Lotrisone 1- 0.05% apply to both feet and between toes twice daily for 4 week  We discussed:    Pt mentions that she has ketoconazole at home. Explained that this is an antifungal medication  Pt reports being almost out of ciclopirox and out of Lotrisone. Recommend she follow up with podiatry to get more refills of these medications  Plan Continue current medications  Medication Management   Pt uses [facility] for all medications Pt does not endorse 100% compliance. States that she misses medications sometimes because she gets busy and forgets.  We discussed:   Importance of medication compliance  Plan Utilize [facility] for medication synchronization, packaging and delivery  Comprehensive medication review performed  Verbal consent obtained for [facility] enhanced pharmacy services (medication synchronization, adherence packaging, delivery coordination). A medication sync plan was created to allow patient to get all medications delivered once every 30 to 90 days per patient preference. Patient understands they have freedom to choose pharmacy and clinical pharmacist will coordinate care between all prescribers and [facility].  Follow up: 8 week phone visit  Jannette Fogo, PharmD Clinical Pharmacist [facility] [phone]

## 2020-06-01 ENCOUNTER — Other Ambulatory Visit: Payer: Self-pay | Admitting: Nurse Practitioner

## 2020-06-01 DIAGNOSIS — F32A Depression, unspecified: Secondary | ICD-10-CM

## 2020-06-01 DIAGNOSIS — F419 Anxiety disorder, unspecified: Secondary | ICD-10-CM

## 2020-06-01 DIAGNOSIS — M25512 Pain in left shoulder: Secondary | ICD-10-CM

## 2020-06-10 ENCOUNTER — Telehealth: Payer: Self-pay

## 2020-06-13 ENCOUNTER — Other Ambulatory Visit: Payer: Self-pay

## 2020-06-13 ENCOUNTER — Emergency Department (HOSPITAL_COMMUNITY)
Admission: EM | Admit: 2020-06-13 | Discharge: 2020-06-14 | Disposition: A | Discharge status: Home / Self Care | Payer: PPO | Attending: Emergency Medicine | Admitting: Emergency Medicine

## 2020-06-13 DIAGNOSIS — Z7984 Long term (current) use of oral hypoglycemic drugs: Secondary | ICD-10-CM | POA: Insufficient documentation

## 2020-06-13 DIAGNOSIS — T6591XA Toxic effect of unspecified substance, accidental (unintentional), initial encounter: Secondary | ICD-10-CM

## 2020-06-13 DIAGNOSIS — Z8673 Personal history of transient ischemic attack (TIA), and cerebral infarction without residual deficits: Secondary | ICD-10-CM | POA: Diagnosis not present

## 2020-06-13 DIAGNOSIS — Z79899 Other long term (current) drug therapy: Secondary | ICD-10-CM | POA: Diagnosis not present

## 2020-06-13 DIAGNOSIS — E119 Type 2 diabetes mellitus without complications: Secondary | ICD-10-CM | POA: Insufficient documentation

## 2020-06-13 DIAGNOSIS — I2510 Atherosclerotic heart disease of native coronary artery without angina pectoris: Secondary | ICD-10-CM | POA: Insufficient documentation

## 2020-06-13 DIAGNOSIS — T492X1A Poisoning by local astringents and local detergents, accidental (unintentional), initial encounter: Secondary | ICD-10-CM | POA: Diagnosis not present

## 2020-06-13 DIAGNOSIS — I10 Essential (primary) hypertension: Secondary | ICD-10-CM | POA: Diagnosis not present

## 2020-06-14 ENCOUNTER — Encounter (HOSPITAL_COMMUNITY): Payer: Self-pay | Admitting: *Deleted

## 2020-06-14 ENCOUNTER — Ambulatory Visit: Payer: Self-pay

## 2020-06-14 ENCOUNTER — Other Ambulatory Visit: Payer: Self-pay

## 2020-06-14 DIAGNOSIS — G3184 Mild cognitive impairment, so stated: Secondary | ICD-10-CM

## 2020-06-14 DIAGNOSIS — E119 Type 2 diabetes mellitus without complications: Secondary | ICD-10-CM

## 2020-06-14 DIAGNOSIS — I10 Essential (primary) hypertension: Secondary | ICD-10-CM

## 2020-06-14 NOTE — ED Notes (Signed)
Spoke with poison control. States that since patient did not swallow "unknown" substance and spit it out there no need for monitoring. They have no recommendations at this time.

## 2020-06-14 NOTE — Chronic Care Management (AMB) (Signed)
  Care Management    Consult Note  06/14/2020 Name: AJANAY FARVE MRN: [id number] DOB: 1950/12/25  Care management team received notification of patient's recent emergency department visit related to accidental ingestion of unknown substance .Based on review of health record, CHANTRELL APSEY is currently active in the embedded care coordination program.   Review of patient status, including review of consultants reports, relevant laboratory and other test results, and collaboration with appropriate care team members and the patient's provider was performed as part of comprehensive patient evaluation and provision of chronic care management services.    Plan: Collaboration with RN Care Manager and embedded PharmD to inform of recent ED visit. SW is available to assist with care coordination resources if needs arise.  Daneen Schick, BSW, CDP Social Worker, Certified Dementia Practitioner [facility] [phone]

## 2020-06-14 NOTE — Discharge Instructions (Addendum)
Thank you for allowing me to care for you today in the Emergency Department.   You have now been observed for almost 12 hours since the time of ingestion and has not had any symptoms.  Please make sure that you are labeling all containers that may have cleaning products or other substances in them to avoid an accidental ingestion.  Return the emergency department if you develop uncontrollable vomiting, difficulty breathing, severe abdominal pain, or other new, concerning symptoms.

## 2020-06-14 NOTE — ED Triage Notes (Signed)
Pt states she picked up an unlabeled bottle of what she thought was lemonade on her night stand, she said it tasted soapy and bubbly so she spit it out. She wanted to be checked out because she does not know what it was she drank.

## 2020-06-14 NOTE — ED Notes (Signed)
PA at bedside.

## 2020-06-14 NOTE — ED Provider Notes (Signed)
[facility] EMERGENCY DEPARTMENT Provider Note   CSN: [id number] Arrival date & time: 06/13/20  2343     History Chief Complaint  Patient presents with  . Ingestion    Bridget Good is a 69 y.o. female with history of CVA, clival lesion followed by oncology and neurosurgery, GERD, fibromyalgia, HLD, and HTN who presents to the emergency department with a chief complaint of ingestion.  The patient states that she took a drink from a water bottle that she thought was lemonade on her husband's night stand at approximately 17:00 yesterday.  Reports that she did not swallow any of the liquid and noticed after it was in her mouth that it did not taste right so she spit it back out.  She reports that she was cleaning yesterday and using Pine-Sol.  Neither she or her husband recall mixing any chemicals or cleaning products in the water bottle and they are unsure of how the water bottle ended on his nightstand.  She reports that she has been having increased flatus since the episode, but denies chest pain, shortness of breath, dysphagia, sore throat, oral pain or bleeding, nausea, vomiting, diarrhea, abdominal pain, confusion, headache, or other associated symptoms. No SI, HI, or AVN.   The history is provided by the patient. No language interpreter was used.       Past Medical History:  Diagnosis Date  . Angina   . Anxiety   . Bronchitis   . Depression   . Diabetes mellitus   . Fibromyalgia   . GERD (gastroesophageal reflux disease)   . Headache(784.0)   . Hyperlipidemia   . Hypertension   . Hypertensive heart disease without CHF   . Lupus ([place])    "treated for it from 1992 til 2012; dr said I don't have it anymore"  . Obesity (BMI 30-39.9)   . Osteoarthritis   . Pneumonia   . Shortness of breath    lying down, upon exertion  . Shortness of breath on exertion   . Stroke [facility])    2019    Patient Active Problem List   Diagnosis Date Noted  . Skull mass  02/27/2019  . 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 Anxiety about health 02/09/2019  . Brain tumor ([place]) 02/04/2019  . Arm bruise, left, initial encounter 01/07/2019  . Anxiety 12/30/2018  . Decreased estrogen level 12/23/2018  . Abnormal blood chemistry 12/23/2018  . Depression 12/23/2018  . CNS mass 12/15/2018  . Stroke ([place]) 12/15/2018  . Hypertensive retinopathy 12/08/2018  . Palpitations 07/16/2018  . Cerebral thrombosis with cerebral infarction 07/12/2018  . Chest pain 07/11/2018  . Abnormal EKG 07/11/2018  . AKI (acute kidney injury) ([place]) 07/11/2018  . Essential hypertension   . Retrognathia 07/10/2018  . Psychophysiological insomnia 07/10/2018  . Snoring 07/10/2018  . History of lupus 01/02/2012  . CAD (coronary artery disease)   . Type 2 diabetes mellitus without complication, without long-term current use of insulin ([place])   . Hypertensive heart disease without CHF   . Hyperlipidemia   . Obesity (BMI 30-39.9)   . GERD (gastroesophageal reflux disease)   . Osteoarthritis     Past Surgical History:  Procedure Laterality Date  . ABDOMINAL HYSTERECTOMY     partial  . CARDIAC CATHETERIZATION  ~ 2007  . CESAREAN SECTION  1978; 1981  . COLONOSCOPY    . CRANIOTOMY N/A 02/27/2019   Procedure: Endonasal Endoscopic biopsy of clival mass;  Surgeon: Judith Part, MD;  Location: [facility] OR;  Service: Neurosurgery;  Laterality: N/A;  Endonasal Endoscopic biopsy of clival mass  . ENDOSCOPIC TRANS NASAL APPROACH WITH FUSION N/A 02/27/2019   Procedure: ENDOSCOPIC TRANS NASAL APPROACH WITH FUSION;  Surgeon: Judith Part, MD;  Location: [facility];  Service: Neurosurgery;  Laterality: N/A;  ENDOSCOPIC TRANS NASAL APPROACH WITH FUSION  . FOOT SURGERY     "had to cut it to let the fluids out; it had swollen very badly; left foot"     OB History   No obstetric history on file.     Family History  Problem Relation Age of Onset  . Heart attack Father   . Diabetes Father   .  Hypertension Mother   . Heart attack Brother   . Kidney failure Brother   . Diabetes Sister   . Diabetes Sister     Social History   Tobacco Use  . Smoking status: Never Smoker  . Smokeless tobacco: Never Used  Vaping Use  . Vaping Use: Never used  Substance Use Topics  . Alcohol use: No    Alcohol/week: 0.0 standard drinks  . Drug use: No    Home Medications Prior to Admission medications   Medication Sig Start Date End Date Taking? Authorizing Provider  acetaminophen (TYLENOL) 500 MG tablet Take 1 tablet (500 mg total) by mouth as needed for mild pain. 06/04/19   [name], FNP  amLODipine-benazepril (LOTREL) 10-40 MG capsule TAKE 1 CAPSULE BY MOUTH DAILY.(AM) 05/05/20   [name], FNP  aspirin EC 81 MG tablet One tablet by mouth daily 12/02/19   [name], FNP  blood glucose meter kit and supplies Dispense based on patient and insurance preference. Use up to four times daily as directed. (FOR ICD-10 E10.9, E11.9). 06/04/19   [name], FNP  Blood Glucose Monitoring Suppl (FREESTYLE LITE) DEVI Check blood sugars twice daily 06/04/19   [name], FNP  ciclopirox [facility]) 8 % solution Apply topically at bedtime. Apply over nail and surrounding skin. Apply daily over previous coat. Remove weekly with polish remover. Patient not taking: Reported on 05/31/2020 02/05/19   Marzetta Board, DPM  clopidogrel (PLAVIX) 75 MG tablet TAKE ONE TABLET BY MOUTH ONCE DAILY (AM) 05/05/20   [name], FNP  clotrimazole-betamethasone (LOTRISONE) cream Apply to both feet and between toes bid x 4 weeks. Patient not taking: Reported on 05/11/2020 08/10/19   Marzetta Board, DPM  diclofenac Sodium (VOLTAREN) 1 % GEL APPLY 2 GRAMS TOPICALLY 4 (FOUR) TIMES DAILY. 06/01/20   [name], FNP  empagliflozin (JARDIANCE) 10 MG TABS tablet Take 10 mg by mouth daily. 03/25/19   [name], FNP  escitalopram (LEXAPRO) 5 MG tablet TAKE ONE TABLET BY MOUTH DAILY AT BEDTIME. 06/01/20    [name], FNP  fluticasone (FLONASE) 50 MCG/ACT nasal spray Place into both nostrils daily. Patient not taking: Reported on 05/31/2020    [provider]  Ginsengs-Royal Jelly-Vit B12 (GINSENG ROYAL JELLY PLUS) 375-1-15 MG-MG-MCG CAPS Take 1 capsule by mouth daily. Patient not taking: Reported on 05/31/2020 06/04/19   [name], FNP  glucose blood test strip Test blood sugars up to 4 times a day 06/16/19   [name], FNP  Lancets (FREESTYLE) lancets Use as instructed 06/15/19   [name], FNP  metoprolol succinate (TOPROL-XL) 50 MG 24 hr tablet TAKE 1 TABLET BY MOUTH DAILY. TAKE WITH OR IMMEDIATELY FOLLOWING A MEAL (AM) 05/05/20   [name], FNP  nitroGLYCERIN (NITROSTAT) 0.4 MG SL tablet Place 1  tablet (0.4 mg total) under the tongue every 5 (five) minutes as needed for chest pain. 06/04/19   [name], FNP  NONFORMULARY OR COMPOUNDED ITEM Antifungal solution: Terbinafine 3%, Fluconazole 2%, Tea Tree Oil 5%, Urea 10%, Ibuprofen 2% in DMSO suspension #53m Patient not taking: Reported on 05/31/2020 11/02/19   GMarzetta Board DPM  pantoprazole (PROTONIX) 40 MG tablet TAKE ONE TABLET BY MOUTH DAILY IN THE MORNING. 05/05/20   MMinette Brine FNP  potassium chloride SA (KLOR-CON) 20 MEQ tablet TAKE ONE TABLET BY MOUTH ONCE DAILY (AM) 05/05/20   MMinette Brine FNP  rosuvastatin (CRESTOR) 20 MG tablet TAKE ONE TABLET BY MOUTH ONCE DAILY (BEDTIME) 05/05/20   MMinette Brine FNP  Vitamin D, Ergocalciferol, (DRISDOL) 1.25 MG (50000 UNIT) CAPS capsule TAKE 1 CAPSULE TWICE A WEEK ( MONDAY & THURSDAY) 03/30/20   MMinette Brine FNP    Allergies    Sulfa antibiotics  Review of Systems   Review of Systems  Constitutional: Negative for activity change, chills and fever.  HENT: Negative for congestion, mouth sores, sinus pressure, sinus pain, sore throat and trouble swallowing.   Eyes: Negative for visual disturbance.  Respiratory: Negative for shortness of breath.   Cardiovascular:  Negative for chest pain and palpitations.  Gastrointestinal: Negative for abdominal pain, diarrhea, nausea and vomiting.  Genitourinary: Negative for dysuria.  Musculoskeletal: Negative for back pain.  Skin: Negative for rash.  Allergic/Immunologic: Negative for immunocompromised state.  Neurological: Negative for seizures, syncope, weakness, numbness and headaches.  Psychiatric/Behavioral: Negative for confusion.    Physical Exam Updated Vital Signs BP 127/86   Pulse (!) 45   Temp 98.1 F (36.7 C) (Oral)   Resp 18   SpO2 100%   Physical Exam Vitals and nursing note reviewed.  Constitutional:      General: She is not in acute distress.    Appearance: Normal appearance. She is not ill-appearing, toxic-appearing or diaphoretic.  HENT:     Head: Normocephalic.     Mouth/Throat:     Mouth: Mucous membranes are moist.     Pharynx: No oropharyngeal exudate or posterior oropharyngeal erythema.     Comments: No ulcerations, rashes, or bleeding noted in the oral cavity.  Posterior oropharynx is unremarkable. Eyes:     Conjunctiva/sclera: Conjunctivae normal.  Cardiovascular:     Rate and Rhythm: Normal rate and regular rhythm.     Heart sounds: No murmur heard.  No friction rub. No gallop.   Pulmonary:     Effort: Pulmonary effort is normal. No respiratory distress.     Breath sounds: No stridor. No wheezing, rhonchi or rales.  Chest:     Chest wall: No tenderness.  Abdominal:     General: There is no distension.     Palpations: Abdomen is soft. There is no mass.     Tenderness: There is no abdominal tenderness. There is no right CVA tenderness, left CVA tenderness, guarding or rebound.     Hernia: No hernia is present.  Musculoskeletal:     Cervical back: Neck supple.  Skin:    General: Skin is warm.     Findings: No rash.  Neurological:     Mental Status: She is alert.  Psychiatric:        Behavior: Behavior normal.     ED Results / Procedures / Treatments    Labs (all labs ordered are listed, but only abnormal results are displayed) Labs Reviewed - No data to display  EKG None  Radiology No results  found.  Procedures Procedures (including critical care time)  Medications Ordered in ED Medications - No data to display  ED Course  I have reviewed the triage vital signs and the nursing notes.  Pertinent labs & imaging results that were available during my care of the patient were reviewed by me and considered in my medical decision making (see chart for details).    MDM Rules/Calculators/A&P                          69 year old female with history of CVA, clival lesion followed by oncology and neurosurgery, GERD, fibromyalgia, HLD, and HTN presenting after accidental ingestion of an unknown substance that occurred at 17:00.  No SI, HI, or AVN. she took a drink from a water bottle containing an unknown substance and then promptly spit it back out.  She has the bottle with her at bedside.  The faint yellow substance smells strongly of a liquid soap or detergent ([place]?).  I am not sure what the yellow substance is?  Perhaps there was a minimal amount of lemonade prior to the detergent being mixed in. Specifically, I do not smell any bleach, or ammonia products.   Poison control was contacted by triage RN who did not have any further recommendations at this time.  The patient was discussed with Dr. Nicholes Stairs, attending physician.  Vital signs are normal.  Her physical exam is unremarkable.  Ingestion occurred more than 10 hours ago and she is asymptomatic.  At this time, I feel that no further urgent or emergent indication is warranted.  Strongly advised the patient and her husband to make sure that they are labeling all mixtures of substances, especially if they are using them to clean around the household.  All questions answered.  She is hemodynamically stable and in no acute distress.  Safe for discharge to home with outpatient follow-up as  indicated.  Final Clinical Impression(s) / ED Diagnoses Final diagnoses:  Accidental ingestion of substance, initial encounter    Rx / DC Orders ED Discharge Orders    None       Joanne Gavel, PA-C 06/14/20 0720    Palumbo, April, MD 06/15/20 2700

## 2020-06-27 NOTE — Patient Instructions (Addendum)
Visit Information  Goals Addressed            This Visit's Progress   . Pharmacy Care Plan       CARE PLAN ENTRY  Current Barriers:  . Chronic Disease Management support, education, and care coordination needs related to Hypertension, Hyperlipidemia, and Diabetes   Hypertension . Pharmacist Clinical Goal(s): o Over the next 90 days, patient will work with PharmD and providers to maintain BP goal <130/80 . Current regimen:  o Amlodipine/benazepril 10/40mg  daily o Metoprolol succinate 50mg  daily with or immediately following a meal . Interventions: o Extensive dietary and exercise recommendations provided o Advised patient that I will assist with using home blood pressure monitor if she will come into the office . Patient self care activities - Over the next 90 days, patient will: o Check BP as needed if symptomatic, document, and provide at future appointments o Ensure daily salt intake < 2300 mg/day  Hyperlipidemia . Pharmacist Clinical Goal(s): o Over the next 90 days, patient will work with PharmD and providers to achieve LDL goal < 70 . Current regimen:  o Rosuvastatin 20mg  at bedtime . Interventions: o Extensive dietary and exercise recommendations provided o Monitor next lipid panel to determine appropriateness of statin regimen . Patient self care activities - Over the next 90 days, patient will: o Increase exercise to 10 minutes 5 times weekly (with eventual goal of 30 minutes 5 times weekly) o Limit eating out to 3 times weekly o Limit the use of sauces and dressings  Diabetes . Pharmacist Clinical Goal(s): o Over the next 90 days, patient will work with PharmD and providers to achieve A1c goal <7% . Current regimen:  o Jardiance 10mg  daily . Interventions: o Encouraged taking medication daily as directed o Extensive dietary and exercise recommendations provided o Samples of Jardiance available at the office o Will assist in applying for patient assistance for  Jardiance through [employer] (patient needs to provide proof of income documents) o Counseled patient on use of blood glucose monitor and how to obtain more blood when pricking finger o Advised patient I will assist with using blood glucose meter if she will come into the office o Contacted [employer] and determined that patient is eligible for Silver Sneakers benefit, she just needs to call the activate it. Notified patient o Not eligible for CGM due to not being on insulin . Patient self care activities - Over the next 45 days, patient will: o Check blood sugar once daily, document, and provide at future appointments o Massage finger from base to tip after pricking it to obtain a larger droplet of blood o Increase exercise to 10 minutes 5 times weekly o Limit carbohydrates (pasta, bread, potatoes, rice, etc) o Switch out lettuce for buns and bread when eating burgers and sandwiches o Limit sauces and dressings o Contact provider with any episodes of hypoglycemia o Provide proof of income documents for Jardiance patient assistance program  Medication management . Pharmacist Clinical Goal(s): o Over the next 45 days, patient will work with PharmD and providers to achieve optimal medication adherence . Current pharmacy: [facility] . Interventions o Comprehensive medication review performed o Utilize [facility] for medication synchronization, packaging and delivery o Provided education regarding proper use of diclofenac/Voltaren and advised patient that these are the same medication o Instructed patient to follow up with podiatry for refills of antifungal medications for feet . Patient self care activities - Over the next 45 days,  patient will: o Take medications as prescribed o Report any questions or concerns to PharmD and/or provider(s)  Please see past updates related to this goal by clicking on the "Past Updates" button in the selected goal          The patient verbalized understanding of instructions provided today and agreed to receive a mailed copy of patient instruction and/or educational materials.  Telephone follow up appointment with pharmacy team member scheduled for: 07/22/20 @ 8:30 AM  Jannette Fogo, PharmD Clinical Pharmacist [facility] [phone]    Blood Glucose Monitoring, Adult Monitoring your blood sugar (glucose) is an important part of managing your diabetes (diabetes mellitus). Blood glucose monitoring involves checking your blood glucose as often as directed and keeping a record (log) of your results over time. Checking your blood glucose regularly and keeping a blood glucose log can:  Help you and your health care provider adjust your diabetes management plan as needed, including your medicines or insulin.  Help you understand how food, exercise, illnesses, and medicines affect your blood glucose.  Let you know what your blood glucose is at any time. You can quickly find out if you have low blood glucose (hypoglycemia) or high blood glucose (hyperglycemia). Your health care provider will set individualized treatment goals for you. Your goals will be based on your age, other medical conditions you have, and how you respond to diabetes treatment. Generally, the goal of treatment is to maintain the following blood glucose levels:  Before meals (preprandial): 80-130 mg/dL (4.4-7.2 mmol/L).  After meals (postprandial): below 180 mg/dL (10 mmol/L).  A1c level: less than 7%. Supplies needed:  Blood glucose meter.  Test strips for your meter. Each meter has its own strips. You must use the strips that came with your meter.  A needle to prick your finger (lancet). Do not use a lancet more than one time.  A device that holds the lancet (lancing device).  A journal or log book to write down your results. How to check your blood glucose  1. Wash your hands with soap and  water. 2. Prick the side of your finger (not the tip) with the lancet. Use a different finger each time. 3. Gently rub the finger until a small drop of blood appears. 4. Follow instructions that come with your meter for inserting the test strip, applying blood to the strip, and using your blood glucose meter. 5. Write down your result and any notes. Some meters allow you to use areas of your body other than your finger (alternative sites) to test your blood. The most common alternative sites are:  Forearm.  Thigh.  Palm of the hand. If you think you may have hypoglycemia, or if you have a history of not knowing when your blood glucose is getting low (hypoglycemia unawareness), do not use alternative sites. Use your finger instead. Alternative sites may not be as accurate as the fingers, because blood flow is slower in these areas. This means that the result you get may be delayed, and it may be different from the result that you would get from your finger. Follow these instructions at home: Blood glucose log   Every time you check your blood glucose, write down your result. Also write down any notes about things that may be affecting your blood glucose, such as your diet and exercise for the day. This information can help you and your health care provider: ? Look for patterns in your blood glucose over time. ?  Adjust your diabetes management plan as needed.  Check if your meter allows you to download your records to a computer. Most glucose meters store a record of glucose readings in the meter. If you have type 1 diabetes:  Check your blood glucose 2 or more times a day.  Also check your blood glucose: ? Before every insulin injection. ? Before and after exercise. ? Before meals. ? 2 hours after a meal. ? Occasionally between 2:00 a.m. and 3:00 a.m., as directed. ? Before potentially dangerous tasks, like driving or using heavy machinery. ? At bedtime.  You may need to check your  blood glucose more often, up to 6-10 times a day, if you: ? Use an insulin pump. ? Need multiple daily injections (MDI). ? Have diabetes that is not well-controlled. ? Are ill. ? Have a history of severe hypoglycemia. ? Have hypoglycemia unawareness. If you have type 2 diabetes:  If you take insulin or other diabetes medicines, check your blood glucose 2 or more times a day.  If you are on intensive insulin therapy, check your blood glucose 4 or more times a day. Occasionally, you may also need to check between 2:00 a.m. and 3:00 a.m., as directed.  Also check your blood glucose: ? Before and after exercise. ? Before potentially dangerous tasks, like driving or using heavy machinery.  You may need to check your blood glucose more often if: ? Your medicine is being adjusted. ? Your diabetes is not well-controlled. ? You are ill. General tips  Always keep your supplies with you.  If you have questions or need help, all blood glucose meters have a 24-hour "hotline" phone number that you can call. You may also contact your health care provider.  After you use a few boxes of test strips, adjust (calibrate) your blood glucose meter by following instructions that came with your meter. Contact a health care provider if:  Your blood glucose is at or above 240 mg/dL (13.3 mmol/L) for 2 days in a row.  You have been sick or have had a fever for 2 days or longer, and you are not getting better.  You have any of the following problems for more than 6 hours: ? You cannot eat or drink. ? You have nausea or vomiting. ? You have diarrhea. Get help right away if:  Your blood glucose is lower than 54 mg/dL (3 mmol/L).  You become confused or you have trouble thinking clearly.  You have difficulty breathing.  You have moderate or large ketone levels in your urine. Summary  Monitoring your blood sugar (glucose) is an important part of managing your diabetes (diabetes mellitus).  Blood  glucose monitoring involves checking your blood glucose as often as directed and keeping a record (log) of your results over time.  Your health care provider will set individualized treatment goals for you. Your goals will be based on your age, other medical conditions you have, and how you respond to diabetes treatment.  Every time you check your blood glucose, write down your result. Also write down any notes about things that may be affecting your blood glucose, such as your diet and exercise for the day. This information is not intended to replace advice given to you by your health care provider. Make sure you discuss any questions you have with your health care provider. Document Revised: 09/19/2018 Document Reviewed: 05/07/2016 Elsevier Patient Education  2020 [employer].

## 2020-07-22 ENCOUNTER — Telehealth: Payer: Self-pay

## 2020-07-22 NOTE — Chronic Care Management (AMB) (Deleted)
Chronic Care Management Pharmacy  Name: Bridget Good  MRN: [id number] DOB: [birth year]  Chief Complaint/ HPI  [name],  69 y.o. , female presents for their Follow-Up CCM visit with the clinical pharmacist via telephone.  PCP : [name], FNP  Their chronic conditions include: Diabetes, Hypertension, Hyperlipidemia/CAD, GERD  Office Visits: 05/11/20 OV: Presented for DM and HTN follow up. Pt reported seeing Dr. Terrence Dupont for intermittent chest pain (when she gets upset.angry heart races). Pt reports doing well on medications for DM. Labs ordered (CMP14+EGFR, HgbA1c, Vitamin D, Lipid panel). BP fairly controlled.   01/11/20 OV: Presented for annual exam with diabetes and hypertension follow up. EKG performed (sinus bradycardia left axis anterior fascicular block). Diet and exercise education. Labs ordered (CMP14+EGFR, VitD, UA, HgbA1c lipid panel, TSH). Tdap and Prevnar13 Rxs sent to pharmacy. Bilateral cerumen water lavage. Pap smear performed.    Consult Visits: 02/08/20 Oncology TV w/ Dr Mickeal Skinner: MRI shows progressive disease (asymptomatic progression of granuloma). Follow radiographically while asymptomatic. Repeat MRI yearly, surgical stabilization will be required if decompensation occurs.   02/01/20 Oncology OV w/ Dr. Mickeal Skinner: Presented for follow up after scheduled MRI; Complained of mild memory loss, and headaches, but clinically stable today; Recommended to continue Tylenol use for breakthrough headaches. Follow up after MRI rescheduled  12/17/19 Neurology OV w/ Dr. Brett Fairy: Follow up on sleep study and memory concerns; sleep has improved on melatonin, No significant obstructive sleep apnea, normal EKG w/ some sinus bradycardia; counseling recommended for depression; recommended SSRI trial with sertraline, melatonin 59m or less, and trazodone as needed  11/02/19 Podiatry OV w/ Dr. GElisha Ponder Debridement of toenails, topical mediations prescribed for onychomycosis. Follow up  in 3 months  CCM Encounters: 03/23/20 RN: Patient education on chronic disease states  01/11/20 PharmD: Comprehensive medication review, 1 month of Jardiance samples given  12/21/19 PharmD: Comprehensive medication review, 1 month of Jardiance samples given  12/18/19 PharmD: Encouraged patient to come to clinic for glucometer training, discussed plans for ongoing care management  11/03/19 PharmD: Encouraged patient to come to clinic for glucometer training, discussed plans for ongoing care management  Medications: Outpatient Encounter Medications as of 07/22/2020  Medication Sig  . acetaminophen (TYLENOL) 500 MG tablet Take 1 tablet (500 mg total) by mouth as needed for mild pain.  .Marland KitchenamLODipine-benazepril (LOTREL) 10-40 MG capsule TAKE 1 CAPSULE BY MOUTH DAILY.(AM)  . aspirin EC 81 MG tablet One tablet by mouth daily  . blood glucose meter kit and supplies Dispense based on patient and insurance preference. Use up to four times daily as directed. (FOR ICD-10 E10.9, E11.9).  .Marland KitchenBlood Glucose Monitoring Suppl (FREESTYLE LITE) DEVI Check blood sugars twice daily  . ciclopirox (PENLAC) 8 % solution Apply topically at bedtime. Apply over nail and surrounding skin. Apply daily over previous coat. Remove weekly with polish remover. (Patient not taking: Reported on 05/31/2020)  . clopidogrel (PLAVIX) 75 MG tablet TAKE ONE TABLET BY MOUTH ONCE DAILY (AM)  . clotrimazole-betamethasone (LOTRISONE) cream Apply to both feet and between toes bid x 4 weeks. (Patient not taking: Reported on 05/11/2020)  . diclofenac Sodium (VOLTAREN) 1 % GEL APPLY 2 GRAMS TOPICALLY 4 (FOUR) TIMES DAILY.  .Marland Kitchenempagliflozin (JARDIANCE) 10 MG TABS tablet Take 10 mg by mouth daily.  .Marland Kitchenescitalopram (LEXAPRO) 5 MG tablet TAKE ONE TABLET BY MOUTH DAILY AT BEDTIME.  . fluticasone (FLONASE) 50 MCG/ACT nasal spray Place into both nostrils daily. (Patient not taking: Reported on 05/31/2020)  . Ginsengs-Royal Jelly-Vit B12 (  GINSENG ROYAL JELLY  PLUS) 375-1-15 MG-MG-MCG CAPS Take 1 capsule by mouth daily. (Patient not taking: Reported on 05/31/2020)  . glucose blood test strip Test blood sugars up to 4 times a day  . Lancets (FREESTYLE) lancets Use as instructed  . metoprolol succinate (TOPROL-XL) 50 MG 24 hr tablet TAKE 1 TABLET BY MOUTH DAILY. TAKE WITH OR IMMEDIATELY FOLLOWING A MEAL (AM)  . nitroGLYCERIN (NITROSTAT) 0.4 MG SL tablet Place 1 tablet (0.4 mg total) under the tongue every 5 (five) minutes as needed for chest pain.  . NONFORMULARY OR COMPOUNDED ITEM Antifungal solution: Terbinafine 3%, Fluconazole 2%, Tea Tree Oil 5%, Urea 10%, Ibuprofen 2% in DMSO suspension #81m (Patient not taking: Reported on 05/31/2020)  . pantoprazole (PROTONIX) 40 MG tablet TAKE ONE TABLET BY MOUTH DAILY IN THE MORNING.  .Marland Kitchenpotassium chloride SA (KLOR-CON) 20 MEQ tablet TAKE ONE TABLET BY MOUTH ONCE DAILY (AM)  . rosuvastatin (CRESTOR) 20 MG tablet TAKE ONE TABLET BY MOUTH ONCE DAILY (BEDTIME)  . Vitamin D, Ergocalciferol, (DRISDOL) 1.25 MG (50000 UNIT) CAPS capsule TAKE 1 CAPSULE TWICE A WEEK ( MONDAY & THURSDAY)   No facility-administered encounter medications on file as of 07/22/2020.   Current Diagnosis/Assessment:    Goals Addressed   None     Diabetes   Recent Relevant Labs: Lab Results  Component Value Date/Time   HGBA1C 7.9 (H) 01/11/2020 11:48 AM   HGBA1C 7.1 (H) 10/07/2019 02:16 PM   MICROALBUR 150 01/11/2020 11:43 AM   Kidney Function Lab Results  Component Value Date/Time   CREATININE 1.41 (H) 01/11/2020 11:48 AM   CREATININE 1.30 (H) 10/07/2019 02:16 PM   GFRNONAA 39 (L) 01/11/2020 11:48 AM   GFRAA 45 (L) 01/11/2020 11:48 AM  Stage 3a CKD  Checking BG: Weekly  Recent FBG Readings: Recent pre-meal BG readings:  Recent 2hr PP BG readings:  Recent HS BG readings:  Patient has failed these meds in past: Glipizide/metformin, glyburide, metformin, Januvia Patient is currently uncontrolled on the following medications:    Jardiance 131mdaily  Last diabetic Foot exam: 11/02/19 Last diabetic Eye exam: 06/25/18 Referred for annual eye exam on 02/04/19 Lab Results  Component Value Date/Time   HMDIABEYEEXA Retinopathy (A) 06/25/2018 12:00 AM   HMDIABEYEEXA Retinopathy (A) 06/25/2018 12:00 AM    We discussed: Diet extensively at previous visit  Pt does not report having a great appetite Exercise extensively  Pt is eligible for Silver Sneakers through insurance ([employer]). She just needs to call to activate  Pt is not eligible for CGM due to not using insulin  Samples of Jardiance available at the office for pt to pick up  [place] assistance through [employer] contact me regarding her annual income and provide proof of income  Pt mentioned having difficulty using BG meter. Advised pt that I show her how to use the meter if she will come into the office  Pt stated that she is not able to get much blood out of her fingers and that is part of the reason she has trouble with the meter  Advised pt to massage her finger from base to tip after pricking, which will allow her to get a bigger droplet of blood  Recommend pt check BG every day if possible and at least a few times a week  Plan -Continue current medications  -Assist pt with applying for Jardiance patient assistance program [facility]  Hypertension   Office blood pressures are  BP Readings  from Last 3 Encounters:  06/14/20 127/86  05/11/20 132/80  02/01/20 136/67    Patient has failed these meds in the past: Diltiazem, furosemide, valsartan, valsartan/HCTZ Patient is currently controlled on the following medications:   Amlodipine/benazepril 10/25m daily  Metoprolol succinate 584mdaily with or immediately following a meal  Potassium Chloride SA 2071mdaily   Patient checks BP at home infrequently  Patient home BP readings are ranging: N/A  We discussed:  Diet and exercise  extensively  Medication compliance  Pt states that she is not good at checking BP at home  Advised pt that I could assist with using the BP monitor if she will come into the office  Plan -Continue current medications    Hyperlipidemia   Lipid Panel     Component Value Date/Time   CHOL 164 01/11/2020 1148   TRIG 101 01/11/2020 1148   HDL 70 01/11/2020 1148   CHOLHDL 2.3 01/11/2020 1148   CHOLHDL 3.5 07/13/2018 0516   VLDL 20 07/13/2018 0516   LDLCALC 76 01/11/2020 1148   LABVLDL 18 01/11/2020 1148     The ASCVD Risk score (Goff DC Jr., et al., 2013) failed to calculate for the following reasons:   The patient has a prior MI or stroke diagnosis   Patient has failed these meds in past: Simvastatin Patient is currently uncontrolled on the following medications:   Rosuvastatin 105m69m bedtime  Aspirin 81mg55mly  Clopidogrel 75mg 52my  We discussed:   Diet and exercise extensively  Plan -Continue current medications  -Monitor lipid panel to determine appropriateness of statin regimen  Pain   Patient is currently on the following medications:   Acetaminophen 500mg a68meded for pain  Diclofenac 1% gel apply 2 grams topically 4 times daily  We discussed:    Pt was not sure she could take Tylenol with her other medications  Advised pt that there were no drug interactions between Tylenol and her other medications and it is safe for her to take as directed.  Pt reports she has been using diclofenac on her feet because she thought it was for fungus  Advised pt that diclofenac is an anti-inflammatory medication that is used for pain  Pt is going to start using this on her knees  Pt also reports that she has been buying Voltaren gel OTC and using it on her knees  Explained that diclofenac and Voltaren are the same medication and she does not need to buy OTC Voltaren when she has been getting diclofenac through her insurance at the pharmacy  Plan Continue  current medications   Fungal infection on feet   Patient is currently uncontrolled on the following medications:   Ciclopirox 8% solution apply over nail and surrounding skin at bedtime each day. Remove  Weekly with polish remover  Lotrisone 1- 0.05% apply to both feet and between toes twice daily for 4 week  We discussed:    Pt mentions that she has ketoconazole at home. Explained that this is an antifungal medication  Pt reports being almost out of ciclopirox and out of Lotrisone. Recommend she follow up with podiatry to get more refills of these medications  Plan Continue current medications  Medication Management   Pt uses [facility] for all medications Pt does not endorse 100% compliance. States that she misses medications sometimes because she gets busy and forgets.  We discussed:   Importance of medication compliance  Plan Utilize [facility] for medication synchronization, packaging  and delivery  Comprehensive medication review performed  Verbal consent obtained for [facility] enhanced pharmacy services (medication synchronization, adherence packaging, delivery coordination). A medication sync plan was created to allow patient to get all medications delivered once every 30 to 90 days per patient preference. Patient understands they have freedom to choose pharmacy and clinical pharmacist will coordinate care between all prescribers and [facility].  Follow up: 8 week phone visit  Jannette Fogo, PharmD Clinical Pharmacist [facility] [phone]

## 2020-07-26 ENCOUNTER — Telehealth: Payer: Self-pay

## 2020-07-27 ENCOUNTER — Telehealth: Payer: PPO

## 2020-07-27 ENCOUNTER — Telehealth: Payer: Self-pay

## 2020-07-27 NOTE — Telephone Encounter (Signed)
°  Chronic Care Management   Outreach Note  07/27/2020 Name: Bridget Good MRN: [id number] DOB: 03-10-1951  Referred by: [name], FNP Reason for referral : Care Coordination   SW placed an unsuccessful outbound call to the patient to assist with updating patient care plan. SW unable to leave a voice message due to the patients voice mailbox being full. SW collaboration with Consulting civil engineer who plans to follow up with the patient over the next month.  Follow Up Plan: No SW follow up planned at this time. The patient will remain active with RN Care Manager.  Daneen Schick, BSW, CDP Social Worker, Certified Dementia Practitioner [place] [facility] [place] [facility] [phone]

## 2020-08-10 ENCOUNTER — Telehealth: Payer: Self-pay

## 2020-08-10 NOTE — Chronic Care Management (AMB) (Signed)
Chronic Care Management Pharmacy Assistant   Name: Bridget Good  MRN: [id number] DOB: 11-Aug-1951  Reason for Encounter: Patient Assistance Coordination   PCP : [name], FNP  Allergies:   Allergies  Allergen Reactions  . Sulfa Antibiotics Hives, Itching and Nausea And Vomiting    Medications: Outpatient Encounter Medications as of 08/10/2020  Medication Sig  . acetaminophen (TYLENOL) 500 MG tablet Take 1 tablet (500 mg total) by mouth as needed for mild pain.  Marland Kitchen amLODipine-benazepril (LOTREL) 10-40 MG capsule TAKE 1 CAPSULE BY MOUTH DAILY.(AM)  . aspirin EC 81 MG tablet One tablet by mouth daily  . blood glucose meter kit and supplies Dispense based on patient and insurance preference. Use up to four times daily as directed. (FOR ICD-10 E10.9, E11.9).  Marland Kitchen Blood Glucose Monitoring Suppl (FREESTYLE LITE) DEVI Check blood sugars twice daily  . ciclopirox (PENLAC) 8 % solution Apply topically at bedtime. Apply over nail and surrounding skin. Apply daily over previous coat. Remove weekly with polish remover. (Patient not taking: Reported on 05/31/2020)  . clopidogrel (PLAVIX) 75 MG tablet TAKE ONE TABLET BY MOUTH ONCE DAILY (AM)  . clotrimazole-betamethasone (LOTRISONE) cream Apply to both feet and between toes bid x 4 weeks. (Patient not taking: Reported on 05/11/2020)  . diclofenac Sodium (VOLTAREN) 1 % GEL APPLY 2 GRAMS TOPICALLY 4 (FOUR) TIMES DAILY.  Marland Kitchen empagliflozin (JARDIANCE) 10 MG TABS tablet Take 10 mg by mouth daily.  Marland Kitchen escitalopram (LEXAPRO) 5 MG tablet TAKE ONE TABLET BY MOUTH DAILY AT BEDTIME.  . fluticasone (FLONASE) 50 MCG/ACT nasal spray Place into both nostrils daily. (Patient not taking: Reported on 05/31/2020)  . Ginsengs-Royal Jelly-Vit B12 (GINSENG ROYAL JELLY PLUS) 375-1-15 MG-MG-MCG CAPS Take 1 capsule by mouth daily. (Patient not taking: Reported on 05/31/2020)  . glucose blood test strip Test blood sugars up to 4 times a day  . Lancets (FREESTYLE) lancets  Use as instructed  . metoprolol succinate (TOPROL-XL) 50 MG 24 hr tablet TAKE 1 TABLET BY MOUTH DAILY. TAKE WITH OR IMMEDIATELY FOLLOWING A MEAL (AM)  . nitroGLYCERIN (NITROSTAT) 0.4 MG SL tablet Place 1 tablet (0.4 mg total) under the tongue every 5 (five) minutes as needed for chest pain.  . NONFORMULARY OR COMPOUNDED ITEM Antifungal solution: Terbinafine 3%, Fluconazole 2%, Tea Tree Oil 5%, Urea 10%, Ibuprofen 2% in DMSO suspension #66m (Patient not taking: Reported on 05/31/2020)  . pantoprazole (PROTONIX) 40 MG tablet TAKE ONE TABLET BY MOUTH DAILY IN THE MORNING.  .Marland Kitchenpotassium chloride SA (KLOR-CON) 20 MEQ tablet TAKE ONE TABLET BY MOUTH ONCE DAILY (AM)  . rosuvastatin (CRESTOR) 20 MG tablet TAKE ONE TABLET BY MOUTH ONCE DAILY (BEDTIME)  . Vitamin D, Ergocalciferol, (DRISDOL) 1.25 MG (50000 UNIT) CAPS capsule TAKE 1 CAPSULE TWICE A WEEK ( MONDAY & THURSDAY)   No facility-administered encounter medications on file as of 08/10/2020.    Current Diagnosis: Patient Active Problem List   Diagnosis Date Noted  . Skull mass 02/27/2019  . [place] of clivus ([place] 02/23/2019  . Anxiety about health 02/09/2019  . [place] 02/04/2019  . Arm bruise, left, initial encounter 01/07/2019  . Anxiety 12/30/2018  . Decreased estrogen level 12/23/2018  . Abnormal blood chemistry 12/23/2018  . Depression 12/23/2018  . CNS mass 12/15/2018  . [place]roke ([place] 12/15/2018  . Hypertensive retinopathy 12/08/2018  . Palpitations 07/16/2018  . Cerebral thrombosis with cerebral infarction 07/12/2018  . Chest pain 07/11/2018  . Abnormal EKG 07/11/2018  . AKI (acute kidney  injury) ([place]) 07/11/2018  . Essential hypertension   . Retrognathia 07/10/2018  . Psychophysiological insomnia 07/10/2018  . Snoring 07/10/2018  . History of lupus 01/02/2012  . CAD (coronary artery disease)   . Type 2 diabetes mellitus without complication, without long-term current use of insulin ([place])   . Hypertensive heart  disease without CHF   . Hyperlipidemia   . Obesity (BMI 30-39.9)   . GERD (gastroesophageal reflux disease)   . Osteoarthritis      Follow-Up:  Patient Assistance Coordination- Patient Assistance form completed for Jardiance 10 mg from [facility], form printed with demographics, insurance card and med list. Awaiting patient signature, proof of income and provider signature. Jannette Fogo, CPP aware.  Pharmacy Adherence program onboarding form initiated for [facility]. Jannette Fogo, CPP aware.  Called patient to remind of appointment tomorrow and items needed. Patient will need to bring proof of income, all medication bottles along with blood pressure and blood sugar logs or meters. Awaiting return call.   Patient missed appointment with Pharmacist on 08/11/2020, appointment rescheduled for 08/24/2020.  08/22/2020- Judithann Sheen, CPA tried to contact both patient and spouse, phone number not available to receive calls.  08/23/2020- Tried calling patient and spouse number again, both phone are not available to receive calls. Called emergency contact [place], left message to return call.  Courtney Caudill,CPP updated.  08/24/2020- Tried calling patient and spouses number again, both numbers are not available to receive calls. Jannette Fogo, CPP notified. Appointment is today at 4 pm.  Pattricia Boss, [place] Pharmacist Assistant [phone]

## 2020-08-11 ENCOUNTER — Ambulatory Visit: Payer: PPO | Admitting: Nurse Practitioner

## 2020-08-11 ENCOUNTER — Ambulatory Visit: Payer: PPO

## 2020-08-23 ENCOUNTER — Ambulatory Visit: Payer: PPO | Attending: Internal Medicine

## 2020-08-23 DIAGNOSIS — Z23 Encounter for immunization: Secondary | ICD-10-CM

## 2020-08-23 NOTE — Progress Notes (Signed)
   Covid-19 Vaccination Clinic  Name:  Bridget Good    MRN: [id number] DOB: 11-21-1951  08/23/2020  [name] was observed post Covid-19 immunization for 15 minutes without incident. She was provided with Vaccine Information Sheet and instruction to access the V-Safe system.   Ms. Chain was instructed to call 911 with any severe reactions post vaccine: Marland Kitchen Difficulty breathing  . Swelling of face and throat  . A fast heartbeat  . A bad rash all over body  . Dizziness and weakness

## 2020-08-24 ENCOUNTER — Ambulatory Visit: Payer: PPO | Admitting: Nurse Practitioner

## 2020-08-24 ENCOUNTER — Ambulatory Visit: Payer: PPO

## 2020-08-29 ENCOUNTER — Telehealth: Payer: Self-pay

## 2020-08-29 NOTE — Telephone Encounter (Cosign Needed)
  Chronic Care Management   Outreach Note  08/29/2020 Name: Bridget Good MRN: [id number] DOB: 1951-09-18  Referred by: [name], FNP Reason for referral : Chronic Care Management (FU RN CM Call )   An unsuccessful telephone outreach was attempted today. The patient was referred to the case management team for assistance with care management and care coordination.   Follow Up Plan: Telephone follow up appointment with care management team member scheduled for: 09/22/20  Barb Merino, RN, BSN, CCM Care Management Coordinator [place] [facility]  Direct Phone: [phone]

## 2020-09-22 ENCOUNTER — Telehealth: Payer: PPO

## 2020-10-03 ENCOUNTER — Other Ambulatory Visit: Payer: Self-pay | Admitting: Nurse Practitioner

## 2020-10-03 DIAGNOSIS — F32A Depression, unspecified: Secondary | ICD-10-CM

## 2020-10-03 DIAGNOSIS — M25512 Pain in left shoulder: Secondary | ICD-10-CM

## 2020-10-03 DIAGNOSIS — F419 Anxiety disorder, unspecified: Secondary | ICD-10-CM

## 2020-10-12 ENCOUNTER — Telehealth: Payer: Self-pay

## 2020-10-12 ENCOUNTER — Ambulatory Visit: Payer: PPO

## 2020-10-12 ENCOUNTER — Ambulatory Visit: Payer: PPO | Admitting: Nurse Practitioner

## 2020-10-12 NOTE — Telephone Encounter (Signed)
This nurse called patient in order to follow up on missed appointments. She stated that she forgot. Rescheduled for 11/02/2020.

## 2020-10-28 ENCOUNTER — Telehealth: Payer: PPO

## 2020-10-28 ENCOUNTER — Telehealth: Payer: Self-pay

## 2020-10-28 NOTE — Telephone Encounter (Cosign Needed)
  Chronic Care Management   Outreach Note  10/28/2020 Name: Bridget Good MRN: [id number] DOB: 05-22-1951  Referred by: [name], FNP Reason for referral : Chronic Care Management (RNCM FU Call )   An unsuccessful telephone outreach was attempted today. The patient was referred to the case management team for assistance with care management and care coordination.   Follow Up Plan: Telephone follow up appointment with care management team member scheduled for: 11/23/20  Barb Merino, RN, BSN, CCM Care Management Coordinator [place] [facility]  Direct Phone: [phone]

## 2020-11-02 ENCOUNTER — Ambulatory Visit: Payer: PPO | Admitting: Nurse Practitioner

## 2020-11-02 ENCOUNTER — Telehealth: Payer: Self-pay

## 2020-11-02 NOTE — Telephone Encounter (Signed)
This nurse called patient in regards to missing appointments. She states that she had an important meeting that she had to attend and could not get away. Rescheduled for 11/24/2020.

## 2020-11-23 ENCOUNTER — Telehealth: Payer: PPO

## 2020-11-23 ENCOUNTER — Other Ambulatory Visit: Payer: Self-pay

## 2020-11-23 ENCOUNTER — Ambulatory Visit: Payer: Self-pay

## 2020-11-23 DIAGNOSIS — G3184 Mild cognitive impairment, so stated: Secondary | ICD-10-CM

## 2020-11-23 DIAGNOSIS — E119 Type 2 diabetes mellitus without complications: Secondary | ICD-10-CM

## 2020-11-23 DIAGNOSIS — E782 Mixed hyperlipidemia: Secondary | ICD-10-CM

## 2020-11-23 DIAGNOSIS — I10 Essential (primary) hypertension: Secondary | ICD-10-CM

## 2020-11-23 DIAGNOSIS — E559 Vitamin D deficiency, unspecified: Secondary | ICD-10-CM

## 2020-11-24 ENCOUNTER — Ambulatory Visit: Payer: PPO | Admitting: Nurse Practitioner

## 2020-11-24 ENCOUNTER — Telehealth: Payer: Self-pay

## 2020-11-24 NOTE — Telephone Encounter (Signed)
This nurse attempted to call patient due to missed appointments. Voice mailbox was full. Unable to leave a message.

## 2020-11-29 NOTE — Chronic Care Management (AMB) (Signed)
Chronic Care Management   Follow Up Note   11/23/2020 Name: Bridget Good MRN: [id number] DOB: 1951/08/03  Referred by: [name], FNP Reason for referral : Chronic Care Management (RN CM FU Call )   Bridget Good is a 69 y.o. year old female who is a primary care patient of [name], [place]. The CCM team was consulted for assistance with chronic disease management and care coordination needs.    Review of patient status, including review of consultants reports, relevant laboratory and other test results, and collaboration with appropriate care team members and the patient's provider was performed as part of comprehensive patient evaluation and provision of chronic care management services.    SDOH (Social Determinants of Health) assessments performed: Yes - financial restraints  See Care Plan activities for detailed interventions related to [place])   Placed outbound CCM RN CM follow up call to patient for a care plan update.     Outpatient Encounter Medications as of 11/23/2020  Medication Sig  . acetaminophen (TYLENOL) 500 MG tablet Take 1 tablet (500 mg total) by mouth as needed for mild pain.  Marland Kitchen amLODipine-benazepril (LOTREL) 10-40 MG capsule TAKE 1 CAPSULE BY MOUTH DAILY.(AM)  . aspirin EC 81 MG tablet One tablet by mouth daily  . blood glucose meter kit and supplies Dispense based on patient and insurance preference. Use up to four times daily as directed. (FOR ICD-10 E10.9, E11.9).  Marland Kitchen Blood Glucose Monitoring Suppl (FREESTYLE LITE) DEVI Check blood sugars twice daily  . ciclopirox (PENLAC) 8 % solution Apply topically at bedtime. Apply over nail and surrounding skin. Apply daily over previous coat. Remove weekly with polish remover. (Patient not taking: Reported on 05/31/2020)  . clopidogrel (PLAVIX) 75 MG tablet TAKE ONE TABLET BY MOUTH ONCE DAILY (AM)  . clotrimazole-betamethasone (LOTRISONE) cream Apply to both feet and between toes bid x 4 weeks. (Patient not  taking: Reported on 05/11/2020)  . diclofenac Sodium (VOLTAREN) 1 % GEL APPLY 2 GRAMS TOPICALLY 4 (FOUR) TIMES DAILY.  Marland Kitchen empagliflozin (JARDIANCE) 10 MG TABS tablet Take 10 mg by mouth daily.  Marland Kitchen escitalopram (LEXAPRO) 5 MG tablet TAKE ONE TABLET BY MOUTH DAILY AT BEDTIME.  . fluticasone (FLONASE) 50 MCG/ACT nasal spray Place into both nostrils daily. (Patient not taking: Reported on 05/31/2020)  . Ginsengs-Royal Jelly-Vit B12 (GINSENG ROYAL JELLY PLUS) 375-1-15 MG-MG-MCG CAPS Take 1 capsule by mouth daily. (Patient not taking: Reported on 05/31/2020)  . glucose blood test strip Test blood sugars up to 4 times a day  . Lancets (FREESTYLE) lancets Use as instructed  . metoprolol succinate (TOPROL-XL) 50 MG 24 hr tablet TAKE 1 TABLET BY MOUTH DAILY. TAKE WITH OR IMMEDIATELY FOLLOWING A MEAL (AM)  . nitroGLYCERIN (NITROSTAT) 0.4 MG SL tablet Place 1 tablet (0.4 mg total) under the tongue every 5 (five) minutes as needed for chest pain.  . NONFORMULARY OR COMPOUNDED ITEM Antifungal solution: Terbinafine 3%, Fluconazole 2%, Tea Tree Oil 5%, Urea 10%, Ibuprofen 2% in DMSO suspension #23m (Patient not taking: Reported on 05/31/2020)  . pantoprazole (PROTONIX) 40 MG tablet TAKE ONE TABLET BY MOUTH DAILY IN THE MORNING.  .Marland Kitchenpotassium chloride SA (KLOR-CON) 20 MEQ tablet TAKE ONE TABLET BY MOUTH ONCE DAILY (AM)  . rosuvastatin (CRESTOR) 20 MG tablet TAKE ONE TABLET BY MOUTH ONCE DAILY (BEDTIME)  . Vitamin D, Ergocalciferol, (DRISDOL) 1.25 MG (50000 UNIT) CAPS capsule TAKE 1 CAPSULE TWICE A WEEK ( MONDAY & THURSDAY)   No facility-administered encounter medications on file as  of 11/23/2020.     Objective:  Lab Results  Component Value Date   HGBA1C 7.9 (H) 01/11/2020   HGBA1C 7.1 (H) 10/07/2019   HGBA1C 7.5 (H) 05/14/2019   Lab Results  Component Value Date   MICROALBUR 150 01/11/2020   LDLCALC 76 01/11/2020   CREATININE 1.41 (H) 01/11/2020    Goals Addressed      Patient Stated   .  COMPLETED: "I  am having left arm pain" (pt-stated)        Current Barriers:  Marland Kitchen Knowledge Deficits related to diagnosis and treatment for left arm pain  Nurse Case Manager Clinical Goal(s):  Marland Kitchen Over the next 30 days, patient will work with PCP provider [name], FNP to address needs related to new onset of left arm pain . Cognitive Deficits . Financial Restraints  CCM RN CM Interventions:  11/23/20 call completed with patient  . Evaluation of current treatment plan related to acute onset of left arm pain and patient's adherence to plan as established by provider . Determined patient is no longer having left arm pain or numbness . Determined patient has no questions or concerns related to this condition  . Discussed plans with patient for ongoing care management follow up and provided patient with direct contact information for care management team  Patient Self Care Activities:  . Unable to independently perform Self Care  Please see past updates related to this goal by clicking on the "Past Updates" button in the selected goal     .  "I am taking my Vitamin D as directed" (pt-stated)   On track     Current Barriers:  Marland Kitchen Knowledge Deficits related to potential complications secondary to Vitamin D deficiency  . Chronic Disease Management support and education needs related to Type 2 Diabetes Mellitus, Essential Hypertension, Mild Cognitive Impairment, Mixed Hyperlipidemia, Vitamin D deficiency . Cognitive Deficits . Financial Restraints  Nurse Case Manager Clinical Goal(s):  Marland Kitchen Over the next 30 days, patient will verbalize basic understanding of the potential complications that may occur secondary to Vitamin D deficiency and self health management plan as evidenced by patient will eat a Vitamin D rich diet and report 100% adherence to following her pharmacological recommendations . Over the next 90 days, patient will have a repeat Vitamin D level redrawn and her level will be WNL  CCM RN CM  Interventions: 11/23/20 call completed with patient  . Evaluation of current treatment plan related to Vitamin D deficieny and patient's adherence to plan as established by provider . Reviewed and discussed patient's current Vitamin D has increased from 7.5 to 49.4 . Educated patient on target range of 60-80 for optimal health . Assessed for adherence to patient taking her Vitamin D as prescribed, 50,000 UT twice weekly on Mondays and Thursdays - patient reports adherence . Discussed plans with patient for ongoing care management follow up and provided patient with direct contact information for care management team  Patient Self Care Activities:  . Currently UNABLE TO independently perform self care . Continue to take Vitamin D exactly as prescribed . Continue to eat Vitamin D rich foods . Continue exposure of daily sunshine when appropriate and able . Keep all MD follow up visits as scheduled . Call the CCM team and or PCP for questions or concerns   Please see past updates related to this goal by clicking on the "Past Updates" button in the selected goal      .  "I have to use my  magnifying glass to see my medicines" (pt-stated)   Not on track     Current Barriers:  Marland Kitchen Knowledge Deficits related to impaired vision  . Chronic Disease Management support and education needs related to Type 2 Diabetes Mellitus, Essential Hypertension, Mild Cognitive Impairment, Mixed Hyperlipidemia, Vitamin D deficiency . Cognitive Deficits . Financial Restraints  Nurse Case Manager Clinical Goal(s):  Marland Kitchen Over the next 60 days, patient will attend all scheduled medical appointments: including her eye exam with [place] scheduled for 06/22/19.  Goal Not Met . 08/04/19 Over the next 60 days, patient will follow up with an Eye Specialist to evaluate impaired vision. Goal Not Met . 11/23/20 New Over the next 30 days, patient will follow up with an Eye Specialist to evaluate impaired vision.   CCM RN  CM Interventions:  11/23/20 completed call with patient . Evaluation of current treatment plan related to impaired vision and patient's adherence to plan as established by provider . Determined patient has not f/u with her Retinal Specialist . Educated on importance of completing annual eye exams to evaluate for diabetic retinopathy and or other potential optic complications; provided verbal education related to potential eye/vision complications that may occur secondary to diabetes  . Offered to assist patient with scheduling her appointment, patient denies but agrees to schedule today following our call . Assessed for barriers for ineffective health maintenance  . Discussed plans with patient for ongoing care management follow up and provided patient with direct contact information for care management team  Patient Self Care Activities:  Over the next 30 days, patient will:  . Schedule eye exam with Retinal Specialist as soon as possible  . Self administer medications as prescribed . Attend all scheduled provider appointments . Call pharmacy for medication refills . Perform ADL's independently . Perform IADL's independently . Calls provider office for new concerns or questions  Please see past updates related to this goal by clicking on the "Past Updates" button in the selected goal     .  "I would like to improve my kidney function" (pt-stated)   On track     [place] (see longitudinal plan of care for additional care plan information)  Current Barriers:  Marland Kitchen Knowledge Deficits related to disease process and Self Health management of CKD  . Chronic Disease Management support and education needs related to DM, HTN, CNS mass . Cognitive Deficits . Financial Restraints   Nurse Case Manager Clinical Goal(s):  Marland Kitchen Over the next 90 days, patient will work with the [facility] CM and PCP  to address needs related to disease education and support to improve Self Health management of CKD    CCM RN CM Interventions:  11/23/20 completed call with patent  . Inter-disciplinary care team collaboration (see longitudinal plan of care) . Evaluation of current treatment plan related to CKD  and patient's adherence to plan as established by provider. . Provided education to patient re: stages of CKD; Educated on patient's current GFR of 45; educated on how to improve renal function by controlling DM and drinking more water . Discussed plans with patient for ongoing care management follow up and provided patient with direct contact information for care management team . Confirmed patient received and reviewed printed  educational materials related to Stages of Chronic Kidney Disease; 6 Ways to be Water Wise  Patient Self Care Activities:  Over the next 90 days, patient will:  . Increase water intake to 64oz per day . Avoid adding  salt substitutes to food . Keep blood pressure and diabetes well controlled . Self administer medications as prescribed . Attend all scheduled provider appointments . Call pharmacy for medication refills . Perform ADL's independently . Perform IADL's independently . Calls provider office for new concerns or questions  Please see past updates related to this goal by clicking on the "Past Updates" button in the selected goal      .  COMPLETED: I would like to continue to control my BGs and keep my A1c<7% (pt-stated)        Current Barriers:   Patient's current BG meter is malfunctioning-->requested new meter (completed)   Pharmacist Clinical Goal(s):   12/18/19: Over the next 90 days, patient will bring in a new glucometer from [facility] and will be able to comfortably self monitor her CBG's GOAL re-established   12/18/2019: Over the next 60 days, patient will have a running log of her BG readings, measuring and recording her readings at least once daily. GOAL re-established   CCM PharmD Interventions: 01/11/2020 clinic visit completed with  patient  Discussed plans with patient for ongoing care management follow up and provided patient with direct contact information for care management team  Patient has received new glucometer, however she has been unable to follow up and come to clinic for glucometer training.  Encouraged patient to come to clinic on Monday or Wednesday to set up glucometer.  A1c has increased to 7.9%, patient has been running out of jardiance sampeles and has not reached out for more.  56-monthof samples was provided for patient    Encouraged patient to call for medication support as needed  Will reach out ne  CCM RN CM Interventions:  10/02/19 call completed with patient  . Determined patient is Self monitoring her CBG's but continues to struggle with doing so, she is not checking daily . Determined patient would like to schedule OV to have CMA instruct and demonstrate with her husband how to use the glucometer and he is willing to help her monitor her CBG's . Evaluation of current treatment plan related to Diabetes and patient's adherence to plan as established by provider. . Provided education to patient re: A1C is up to 7.9 from 7.5 obtained on 01/11/20; discussed the target goal for A1C <7.0; reinforced importance of adhering to prescribed DM treatment plan including taking all diabetic medications exactly as prescribed, monitor FBS and follow established protocol for hypo/hyperglycemic episodes, implementing exercise regularly into daily routine, at least 150 minutes weekly, adhere to strictly following a diabetic friendly diet with Meal planning using the plate method; reinforced the importance of lowering A1C in order to help prevent potential complications that may occur as a result of having uncontrolled DM; Educated on daily glycemic control FBS 80-130, <180 after meals . Reviewed medications with patient and discussed that patient continues to use her pill package system and reports she is taking all  medications exactly as prescribed w/o missed doses  . Discussed plans with patient for ongoing care management follow up and provided patient with direct contact information for care management team . Provided patient with printed educational materials related to Diabetes Safety Zone Tool  . Advised patient, providing education and rationale, to check cbg twice daily before meals and record, calling the CCM team and or PCP provider for findings outside established parameters.    Please see past updates related to this goal by clicking on the "Past Updates" button in the selected goal  Plan:   Telephone follow up appointment with care management team member scheduled for: 2/16/.22  Barb Merino, RN, BSN, CCM Care Management Coordinator [place] [facility]  Direct Phone: [phone]

## 2020-11-29 NOTE — Patient Instructions (Signed)
Visit Information  Goals Addressed      Patient Stated   .  COMPLETED: "I am having left arm pain" (pt-stated)        Current Barriers:  Marland Kitchen Knowledge Deficits related to diagnosis and treatment for left arm pain  Nurse Case Manager Clinical Goal(s):  Marland Kitchen Over the next 30 days, patient will work with PCP provider [name], FNP to address needs related to new onset of left arm pain  CCM RN CM Interventions:  11/23/20 call completed with patient  . Evaluation of current treatment plan related to acute onset of left arm pain and patient's adherence to plan as established by provider . Determined patient is no longer having left arm pain or numbness . Determined patient has no questions or concerns related to this condition  . Discussed plans with patient for ongoing care management follow up and provided patient with direct contact information for care management team  Patient Self Care Activities:  . Unable to independently perform Self Care  Please see past updates related to this goal by clicking on the "Past Updates" button in the selected goal     .  "I am taking my Vitamin D as directed" (pt-stated)   On track     Current Barriers:  Marland Kitchen Knowledge Deficits related to potential complications secondary to Vitamin D deficiency  . Chronic Disease Management support and education needs related to Type 2 Diabetes Mellitus, Essential Hypertension, Mild Cognitive Impairment, Mixed Hyperlipidemia, Vitamin D deficiency . Cognitive Deficits . Financial Restraints  Nurse Case Manager Clinical Goal(s):  Marland Kitchen Over the next 30 days, patient will verbalize basic understanding of the potential complications that may occur secondary to Vitamin D deficiency and self health management plan as evidenced by patient will eat a Vitamin D rich diet and report 100% adherence to following her pharmacological recommendations . Over the next 90 days, patient will have a repeat Vitamin D level redrawn and her level  will be WNL  CCM RN CM Interventions: 11/23/20 call completed with patient  . Evaluation of current treatment plan related to Vitamin D deficieny and patient's adherence to plan as established by provider . Reviewed and discussed patient's current Vitamin D has increased from 7.5 to 49.4 . Educated patient on target range of 60-80 for optimal health . Assessed for adherence to patient taking her Vitamin D as prescribed, 50,000 UT twice weekly on Mondays and Thursdays - patient reports adherence . Discussed plans with patient for ongoing care management follow up and provided patient with direct contact information for care management team  Patient Self Care Activities:  . Currently UNABLE TO independently perform self care . Continue to take Vitamin D exactly as prescribed . Continue to eat Vitamin D rich foods . Continue exposure of daily sunshine when appropriate and able . Keep all MD follow up visits as scheduled . Call the CCM team and or PCP for questions or concerns   Please see past updates related to this goal by clicking on the "Past Updates" button in the selected goal      .  "I have to use my magnifying glass to see my medicines" (pt-stated)   Not on track     Current Barriers:  Marland Kitchen Knowledge Deficits related to impaired vision  . Chronic Disease Management support and education needs related to Type 2 Diabetes Mellitus, Essential Hypertension, Mild Cognitive Impairment, Mixed Hyperlipidemia, Vitamin D deficiency . Cognitive Deficits . Financial Restraints   Nurse Case Manager Clinical  Goal(s):  Marland Kitchen Over the next 60 days, patient will attend all scheduled medical appointments: including her eye exam with [facility] scheduled for 06/22/19.  Goal Not Met . 08/04/19 Over the next 60 days, patient will follow up with an Eye Specialist to evaluate impaired vision. Goal Not Met . 11/23/20 New Over the next 30 days, patient will follow up with an Eye Specialist to evaluate  impaired vision.   CCM RN CM Interventions:  11/23/20 completed call with patient . Evaluation of current treatment plan related to impaired vision and patient's adherence to plan as established by provider . Determined patient has not f/u with her Retinal Specialist . Educated on importance of completing annual eye exams to evaluate for diabetic retinopathy and or other potential optic complications; provided verbal education related to potential eye/vision complications that may occur secondary to diabetes  . Offered to assist patient with scheduling her appointment, patient denies but agrees to schedule today following our call . Assessed for barriers for ineffective health maintenance  . Discussed plans with patient for ongoing care management follow up and provided patient with direct contact information for care management team  Patient Self Care Activities:  Over the next 30 days, patient will:  . Schedule eye exam with Retinal Specialist as soon as possible  . Self administer medications as prescribed . Attend all scheduled provider appointments . Call pharmacy for medication refills . Perform ADL's independently . Perform IADL's independently . Calls provider office for new concerns or questions  Please see past updates related to this goal by clicking on the "Past Updates" button in the selected goal     .  "I would like to improve my kidney function" (pt-stated)   On track     [place] (see longitudinal plan of care for additional care plan information)  Current Barriers:  Marland Kitchen Knowledge Deficits related to disease process and Self Health management of CKD  . Chronic Disease Management support and education needs related to DM, HTN, CNS mass . Cognitive Deficits . Financial Restraints  Nurse Case Manager Clinical Goal(s):  Marland Kitchen Over the next 90 days, patient will work with the [facility] CM and PCP  to address needs related to disease education and support to improve Self  Health management of CKD   CCM RN CM Interventions:  11/23/20 completed call with patent  . Inter-disciplinary care team collaboration (see longitudinal plan of care) . Evaluation of current treatment plan related to CKD  and patient's adherence to plan as established by provider. . Provided education to patient re: stages of CKD; Educated on patient's current GFR of 45; educated on how to improve renal function by controlling DM and drinking more water . Discussed plans with patient for ongoing care management follow up and provided patient with direct contact information for care management team . Confirmed patient received and reviewed printed  educational materials related to Stages of Chronic Kidney Disease; 6 Ways to be Water Wise  Patient Self Care Activities:  Over the next 90 days, patient will:  . Increase water intake to 64oz per day . Avoid adding salt substitutes to food . Keep blood pressure and diabetes well controlled . Self administer medications as prescribed . Attend all scheduled provider appointments . Call pharmacy for medication refills . Perform ADL's independently . Perform IADL's independently . Calls provider office for new concerns or questions  Please see past updates related to this goal by clicking on the "Past Updates" button  in the selected goal      .  COMPLETED: I would like to continue to control my BGs and keep my A1c<7% (pt-stated)        Current Barriers:   Patient's current BG meter is malfunctioning-->requested new meter (completed)   Pharmacist Clinical Goal(s):   12/18/19: Over the next 90 days, patient will bring in a new glucometer from [place] and will be able to comfortably self monitor her CBG's GOAL re-established   12/18/2019: Over the next 60 days, patient will have a running log of her BG readings, measuring and recording her readings at least once daily. GOAL re-established   CCM PharmD Interventions: 01/11/2020 clinic  visit completed with patient  Discussed plans with patient for ongoing care management follow up and provided patient with direct contact information for care management team  Patient has received new glucometer, however she has been unable to follow up and come to clinic for glucometer training.  Encouraged patient to come to clinic on Monday or Wednesday to set up glucometer.  A1c has increased to 7.9%, patient has been running out of jardiance sampeles and has not reached out for more.  19-monthof samples was provided for patient    Encouraged patient to call for medication support as needed  Will reach out ne  CCM RN CM Interventions:  10/02/19 call completed with patient  . Determined patient is Self monitoring her CBG's but continues to struggle with doing so, she is not checking daily . Determined patient would like to schedule OV to have CMA instruct and demonstrate with her husband how to use the glucometer and he is willing to help her monitor her CBG's . Evaluation of current treatment plan related to Diabetes and patient's adherence to plan as established by provider. . Provided education to patient re: A1C is up to 7.9 from 7.5 obtained on 01/11/20; discussed the target goal for A1C <7.0; reinforced importance of adhering to prescribed DM treatment plan including taking all diabetic medications exactly as prescribed, monitor FBS and follow established protocol for hypo/hyperglycemic episodes, implementing exercise regularly into daily routine, at least 150 minutes weekly, adhere to strictly following a diabetic friendly diet with Meal planning using the plate method; reinforced the importance of lowering A1C in order to help prevent potential complications that may occur as a result of having uncontrolled DM; Educated on daily glycemic control FBS 80-130, <180 after meals . Reviewed medications with patient and discussed that patient continues to use her pill package system and reports  she is taking all medications exactly as prescribed w/o missed doses  . Discussed plans with patient for ongoing care management follow up and provided patient with direct contact information for care management team . Provided patient with printed educational materials related to Diabetes Safety Zone Tool  . Advised patient, providing education and rationale, to check cbg twice daily before meals and record, calling the CCM team and or PCP provider for findings outside established parameters.     Please see past updates related to this goal by clicking on the "Past Updates" button in the selected goal        The patient verbalized understanding of instructions, educational materials, and care plan provided today and declined offer to receive copy of patient instructions, educational materials, and care plan.   Telephone follow up appointment with care management team member scheduled for: 01/25/21  ALynne Logan RN

## 2020-12-02 ENCOUNTER — Other Ambulatory Visit: Payer: Self-pay | Admitting: Nurse Practitioner

## 2020-12-02 DIAGNOSIS — E559 Vitamin D deficiency, unspecified: Secondary | ICD-10-CM

## 2020-12-05 ENCOUNTER — Telehealth: Payer: Self-pay

## 2020-12-05 ENCOUNTER — Telehealth: Payer: PPO

## 2020-12-05 NOTE — Telephone Encounter (Signed)
  Chronic Care Management   Outreach Note  12/05/2020 Name: ARLEENE SETTLE MRN: [id number] DOB: [birth year]  Referred by: Arnette Felts, FNP Reason for referral : Care Coordination   CATHERINE CUBERO is enrolled in a Managed Medicaid Health Plan: No  An unsuccessful telephone outreach was attempted today. The patient was referred to the case management team for assistance with care management and care coordination.   Follow Up Plan: A HIPAA compliant phone message was left for the patient providing contact information and requesting a return call.  The care management team will reach out to the patient again over the next 20 days.   Bevelyn Ngo, BSW, CDP Social Worker, Certified Dementia Practitioner [facility] Care Management [phone]

## 2020-12-05 NOTE — Chronic Care Management (AMB) (Signed)
Chronic Care Management Pharmacy Assistant   Name: Bridget Good  MRN: [id number] DOB: 1951/03/15  Reason for Encounter: Medication Review    PCP : [name], FNP  Allergies:   Allergies  Allergen Reactions  . Sulfa Antibiotics Hives, Itching and Nausea And Vomiting    Medications: Outpatient Encounter Medications as of 12/05/2020  Medication Sig  . acetaminophen (TYLENOL) 500 MG tablet Take 1 tablet (500 mg total) by mouth as needed for mild pain.  Marland Kitchen amLODipine-benazepril (LOTREL) 10-40 MG capsule TAKE 1 CAPSULE BY MOUTH DAILY.(AM)  . aspirin EC 81 MG tablet One tablet by mouth daily  . blood glucose meter kit and supplies Dispense based on patient and insurance preference. Use up to four times daily as directed. (FOR ICD-10 E10.9, E11.9).  Marland Kitchen Blood Glucose Monitoring Suppl (FREESTYLE LITE) DEVI Check blood sugars twice daily  . ciclopirox (PENLAC) 8 % solution Apply topically at bedtime. Apply over nail and surrounding skin. Apply daily over previous coat. Remove weekly with polish remover. (Patient not taking: Reported on 05/31/2020)  . clopidogrel (PLAVIX) 75 MG tablet TAKE ONE TABLET BY MOUTH ONCE DAILY (AM)  . clotrimazole-betamethasone (LOTRISONE) cream Apply to both feet and between toes bid x 4 weeks. (Patient not taking: Reported on 05/11/2020)  . diclofenac Sodium (VOLTAREN) 1 % GEL APPLY 2 GRAMS TOPICALLY 4 (FOUR) TIMES DAILY.  Marland Kitchen empagliflozin (JARDIANCE) 10 MG TABS tablet Take 10 mg by mouth daily.  Marland Kitchen escitalopram (LEXAPRO) 5 MG tablet TAKE ONE TABLET BY MOUTH DAILY AT BEDTIME.  . fluticasone (FLONASE) 50 MCG/ACT nasal spray Place into both nostrils daily. (Patient not taking: Reported on 05/31/2020)  . Ginsengs-Royal Jelly-Vit B12 (GINSENG ROYAL JELLY PLUS) 375-1-15 MG-MG-MCG CAPS Take 1 capsule by mouth daily. (Patient not taking: Reported on 05/31/2020)  . glucose blood test strip Test blood sugars up to 4 times a day  . Lancets (FREESTYLE) lancets Use as  instructed  . metoprolol succinate (TOPROL-XL) 50 MG 24 hr tablet TAKE 1 TABLET BY MOUTH DAILY. TAKE WITH OR IMMEDIATELY FOLLOWING A MEAL (AM)  . nitroGLYCERIN (NITROSTAT) 0.4 MG SL tablet Place 1 tablet (0.4 mg total) under the tongue every 5 (five) minutes as needed for chest pain.  . NONFORMULARY OR COMPOUNDED ITEM Antifungal solution: Terbinafine 3%, Fluconazole 2%, Tea Tree Oil 5%, Urea 10%, Ibuprofen 2% in DMSO suspension #56m (Patient not taking: Reported on 05/31/2020)  . pantoprazole (PROTONIX) 40 MG tablet TAKE ONE TABLET BY MOUTH DAILY IN THE MORNING.  .Marland Kitchenpotassium chloride SA (KLOR-CON) 20 MEQ tablet TAKE ONE TABLET BY MOUTH ONCE DAILY (AM)  . rosuvastatin (CRESTOR) 20 MG tablet TAKE ONE TABLET BY MOUTH ONCE DAILY (BEDTIME)  . Vitamin D, Ergocalciferol, (DRISDOL) 1.25 MG (50000 UNIT) CAPS capsule TAKE 1 CAPSULE TWICE A WEEK ( MONDAY & THURSDAY)   No facility-administered encounter medications on file as of 12/05/2020.    Current Diagnosis: Patient Active Problem List   Diagnosis Date Noted  . Skull mass 02/27/2019  . 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 Anxiety about health 02/09/2019  . Brain tumor ([place] 02/04/2019  . Arm bruise, left, initial encounter 01/07/2019  . Anxiety 12/30/2018  . Decreased estrogen level 12/23/2018  . Abnormal blood chemistry 12/23/2018  . Depression 12/23/2018  . CNS mass 12/15/2018  . Stroke ([place] 12/15/2018  . Hypertensive retinopathy 12/08/2018  . Palpitations 07/16/2018  . Cerebral thrombosis with cerebral infarction 07/12/2018  . Chest pain 07/11/2018  . Abnormal EKG 07/11/2018  . AKI (acute kidney  injury) ([place]) 07/11/2018  . Essential hypertension   . Retrognathia 07/10/2018  . Psychophysiological insomnia 07/10/2018  . Snoring 07/10/2018  . History of lupus 01/02/2012  . CAD (coronary artery disease)   . Type 2 diabetes mellitus without complication, without long-term current use of insulin ([place])   . Hypertensive heart disease  without CHF   . Hyperlipidemia   . Obesity (BMI 30-39.9)   . GERD (gastroesophageal reflux disease)   . Osteoarthritis       Follow-Up:  Patient Assistance Coordination - New patient assistance application filled out to [employer] for [facility] . Waiting for provider and patient signature.   Called patient to have her come in to PCP office to bring proof of income and to sign application. Per patient will be in bring proof of income and sign application on [phone]/[phone].   [name], CPP notified   Judithann Sheen, [place] Pharmacist Assistant [phone]

## 2020-12-06 ENCOUNTER — Ambulatory Visit: Payer: PPO | Admitting: Nurse Practitioner

## 2020-12-07 ENCOUNTER — Encounter: Payer: PPO | Admitting: Nurse Practitioner

## 2020-12-07 ENCOUNTER — Ambulatory Visit: Payer: PPO

## 2020-12-19 ENCOUNTER — Encounter: Payer: Self-pay | Admitting: Adult Health

## 2020-12-19 ENCOUNTER — Ambulatory Visit: Payer: PPO | Admitting: Adult Health

## 2020-12-22 ENCOUNTER — Telehealth: Payer: Self-pay

## 2020-12-22 NOTE — Chronic Care Management (AMB) (Signed)
Chronic Care Management Pharmacy Assistant   Name: Bridget Good  MRN: [id number] DOB: 1951-06-05  Reason for Encounter: Hypertension and Diabetes Adherence Call  Patient Questions:  1.  Have you seen any other providers since your last visit? No    2.  Any changes in your medicines or health? No     PCP : [name], FNP  Allergies:   Allergies  Allergen Reactions   Sulfa Antibiotics Hives, Itching and Nausea And Vomiting    Medications: Outpatient Encounter Medications as of 12/22/2020  Medication Sig   acetaminophen (TYLENOL) 500 MG tablet Take 1 tablet (500 mg total) by mouth as needed for mild pain.   amLODipine-benazepril (LOTREL) 10-40 MG capsule TAKE 1 CAPSULE BY MOUTH DAILY.(AM)   ASPIRIN LOW DOSE 81 MG EC tablet TAKE ONE TABLET BY MOUTH ONCE DAILY (AM)   blood glucose meter kit and supplies Dispense based on patient and insurance preference. Use up to four times daily as directed. (FOR ICD-10 E10.9, E11.9).   Blood Glucose Monitoring Suppl (FREESTYLE LITE) DEVI Check blood sugars twice daily   ciclopirox (PENLAC) 8 % solution Apply topically at bedtime. Apply over nail and surrounding skin. Apply daily over previous coat. Remove weekly with polish remover. (Patient not taking: Reported on 05/31/2020)   clopidogrel (PLAVIX) 75 MG tablet TAKE ONE TABLET BY MOUTH ONCE DAILY (AM)   clotrimazole-betamethasone (LOTRISONE) cream Apply to both feet and between toes bid x 4 weeks. (Patient not taking: Reported on 05/11/2020)   diclofenac Sodium (VOLTAREN) 1 % GEL APPLY 2 GRAMS TOPICALLY 4 (FOUR) TIMES DAILY.   empagliflozin (JARDIANCE) 10 MG TABS tablet Take 10 mg by mouth daily.   escitalopram (LEXAPRO) 5 MG tablet TAKE ONE TABLET BY MOUTH DAILY AT BEDTIME.   fluticasone (FLONASE) 50 MCG/ACT nasal spray Place into both nostrils daily. (Patient not taking: Reported on 05/31/2020)   Ginsengs-Royal Jelly-Vit B12 (GINSENG ROYAL JELLY PLUS) 375-1-15 MG-MG-MCG  CAPS Take 1 capsule by mouth daily. (Patient not taking: Reported on 05/31/2020)   glucose blood test strip Test blood sugars up to 4 times a day   Lancets (FREESTYLE) lancets Use as instructed   metoprolol succinate (TOPROL-XL) 50 MG 24 hr tablet TAKE 1 TABLET BY MOUTH DAILY. TAKE WITH OR IMMEDIATELY FOLLOWING A MEAL (AM)   nitroGLYCERIN (NITROSTAT) 0.4 MG SL tablet Place 1 tablet (0.4 mg total) under the tongue every 5 (five) minutes as needed for chest pain.   NONFORMULARY OR COMPOUNDED ITEM Antifungal solution: Terbinafine 3%, Fluconazole 2%, Tea Tree Oil 5%, Urea 10%, Ibuprofen 2% in DMSO suspension #33m (Patient not taking: Reported on 05/31/2020)   pantoprazole (PROTONIX) 40 MG tablet TAKE ONE TABLET BY MOUTH DAILY IN THE MORNING.   potassium chloride SA (KLOR-CON) 20 MEQ tablet TAKE ONE TABLET BY MOUTH ONCE DAILY (AM)   rosuvastatin (CRESTOR) 20 MG tablet TAKE ONE TABLET BY MOUTH ONCE DAILY (BEDTIME)   Vitamin D, Ergocalciferol, (DRISDOL) 1.25 MG (50000 UNIT) CAPS capsule TAKE 1 CAPSULE TWICE A WEEK ( MONDAY & THURSDAY)   No facility-administered encounter medications on file as of 12/22/2020.    Current Diagnosis: Patient Active Problem List   Diagnosis Date Noted   Skull mass 02/27/2019   Chordoma of clivus ([facility] 02/23/2019   Anxiety about health 02/09/2019   Brain tumor ([place] 02/04/2019   Arm bruise, left, initial encounter 01/07/2019   Anxiety 12/30/2018   Decreased estrogen level 12/23/2018   Abnormal blood chemistry 12/23/2018   Depression 12/23/2018   CNS  mass 12/15/2018   Stroke ([place]) 12/15/2018   Hypertensive retinopathy 12/08/2018   Palpitations 07/16/2018   Cerebral thrombosis with cerebral infarction 07/12/2018   Chest pain 07/11/2018   Abnormal EKG 07/11/2018   AKI (acute kidney injury) ([place]) 07/11/2018   Essential hypertension    Retrognathia 07/10/2018   Psychophysiological insomnia 07/10/2018   Snoring 07/10/2018   History of  lupus 01/02/2012   CAD (coronary artery disease)    Type 2 diabetes mellitus without complication, without long-term current use of insulin (H[facility])    Hypertensive heart disease without CHF    Hyperlipidemia    Obesity (BMI 30-39.9)    GERD (gastroesophageal reflux disease)    Osteoarthritis    Recent Relevant Labs: Lab Results  Component Value Date/Time   HGBA1C 7.9 (H) 01/11/2020 11:48 AM   HGBA1C 7.1 (H) 10/07/2019 02:16 PM   MICROALBUR 150 01/11/2020 11:43 AM    Kidney Function Lab Results  Component Value Date/Time   CREATININE 1.41 (H) 01/11/2020 11:48 AM   CREATININE 1.30 (H) 10/07/2019 02:16 PM   GFRNONAA 39 (L) 01/11/2020 11:48 AM   GFRAA 45 (L) 01/11/2020 11:48 AM     Current antihyperglycemic regimen:  ? Jardiance 29m daily   What recent interventions/DTPs have been made to improve glycemic control:  o Recent interventions was to check blood sugar once daily, document, and provide at future appointments, Massage finger from base to tip after pricking it to obtain a larger droplet of blood, Increase exercise to 10 minutes 5 times weekly, Limit carbohydrates (pasta, bread, potatoes, rice, etc), Switch out lettuce for buns and bread when eating burgers and sandwiches, Limit sauces and dressings, Contact provider with any episodes of hypoglycemia.   Have there been any recent hospitalizations or ED visits since last visit with CPP? No    Adherence Review: Is the patient currently on a STATIN medication? Yes Is the patient currently on ACE/ARB medication? No    Reviewed chart prior to disease state call. Spoke with patient regarding BP  Recent Office Vitals: BP Readings from Last 3 Encounters:  06/14/20 127/86  05/11/20 132/80  02/01/20 136/67   Pulse Readings from Last 3 Encounters:  06/14/20 (!) 45  05/11/20 93  02/01/20 68    Wt Readings from Last 3 Encounters:  05/11/20 199 lb 9.6 oz (90.5 kg)  02/01/20 198 lb 12.8 oz (90.2 kg)  01/11/20 193  lb 3.2 oz (87.6 kg)     Kidney Function Lab Results  Component Value Date/Time   CREATININE 1.41 (H) 01/11/2020 11:48 AM   CREATININE 1.30 (H) 10/07/2019 02:16 PM   GFRNONAA 39 (L) 01/11/2020 11:48 AM   GFRAA 45 (L) 01/11/2020 11:48 AM    BMP Latest Ref Rng & Units 01/11/2020 10/07/2019 05/14/2019  Glucose 65 - 99 mg/dL 170(H) 128(H) 139(H)  BUN 8 - 27 mg/dL _0 Creatinine 0.57 - 1.00 mg/dL 1.41(H) 1.30(H) 1.14(H)  BUN/Creat Ratio 12 - 28 11(L) 12 14  Sodium 134 - 144 mmol/L 139 140 141  Potassium 3.5 - 5.2 mmol/L 4.1 4.4 3.7  Chloride 96 - 106 mmol/L 104 104 101  CO2 20 - 29 mmol/L _1 Calcium 8.7 - 10.3 mg/dL 9.8 9.5 9.7     Current antihypertensive regimen:  ? Amlodipine/benazepril 10/468mdaily ? Metoprolol succinate 5077maily with or immediately following a meal    What recent interventions/DTPs have been made by any provider to improve Blood Pressure control since last CPP Visit: Recent interventions was  to check BP as needed, and ensure daily salt intake <2300 mg/day.  Adherence Review: Is the patient currently on ACE/ARB medication? No     Follow-Up:  Pharmacist Review-Third attempt made on 01/02/21.  Notified [name], CPP.  Raynelle Highland, [place] Pharmacist Assistant [phone]

## 2020-12-27 ENCOUNTER — Telehealth: Payer: PPO

## 2020-12-27 ENCOUNTER — Telehealth: Payer: Self-pay

## 2020-12-27 NOTE — Telephone Encounter (Signed)
  Chronic Care Management   Outreach Note  12/27/2020 Name: Bridget Good MRN: [id number] DOB: 01/20/1951  Referred by: [name], FNP Reason for referral : Care Coordination   A second unsuccessful telephone outreach was attempted today. The patient was referred to the case management team for assistance with care management and care coordination.   Follow Up Plan: The care management team will reach out to the patient again over the next 21 days.   Daneen Schick, BSW, CDP Social Worker, Certified Dementia Practitioner [place] / [place] Management [phone]

## 2020-12-30 ENCOUNTER — Telehealth: Payer: Self-pay

## 2020-12-30 NOTE — Chronic Care Management (AMB) (Signed)
Chronic Care Management Pharmacy Assistant   Name: Bridget Good  MRN: [id number] DOB: 1951-10-16  Reason for Encounter: Patient Assistance Coordination  12/30/20-Attempted to outreach the patient regarding patient assistance application for Jardiance. No answer; unable to leave a voicemail.  PCP : [name], FNP  Allergies:   Allergies  Allergen Reactions   Sulfa Antibiotics Hives, Itching and Nausea And Vomiting    Medications: Outpatient Encounter Medications as of 12/30/2020  Medication Sig   acetaminophen (TYLENOL) 500 MG tablet Take 1 tablet (500 mg total) by mouth as needed for mild pain.   amLODipine-benazepril (LOTREL) 10-40 MG capsule TAKE 1 CAPSULE BY MOUTH DAILY.(AM)   ASPIRIN LOW DOSE 81 MG EC tablet TAKE ONE TABLET BY MOUTH ONCE DAILY (AM)   blood glucose meter kit and supplies Dispense based on patient and insurance preference. Use up to four times daily as directed. (FOR ICD-10 E10.9, E11.9).   Blood Glucose Monitoring Suppl (FREESTYLE LITE) DEVI Check blood sugars twice daily   ciclopirox (PENLAC) 8 % solution Apply topically at bedtime. Apply over nail and surrounding skin. Apply daily over previous coat. Remove weekly with polish remover. (Patient not taking: Reported on 05/31/2020)   clopidogrel (PLAVIX) 75 MG tablet TAKE ONE TABLET BY MOUTH ONCE DAILY (AM)   clotrimazole-betamethasone (LOTRISONE) cream Apply to both feet and between toes bid x 4 weeks. (Patient not taking: Reported on 05/11/2020)   diclofenac Sodium (VOLTAREN) 1 % GEL APPLY 2 GRAMS TOPICALLY 4 (FOUR) TIMES DAILY.   empagliflozin (JARDIANCE) 10 MG TABS tablet Take 10 mg by mouth daily.   escitalopram (LEXAPRO) 5 MG tablet TAKE ONE TABLET BY MOUTH DAILY AT BEDTIME.   fluticasone (FLONASE) 50 MCG/ACT nasal spray Place into both nostrils daily. (Patient not taking: Reported on 05/31/2020)   Ginsengs-Royal Jelly-Vit B12 (GINSENG ROYAL JELLY PLUS) 375-1-15 MG-MG-MCG CAPS Take 1  capsule by mouth daily. (Patient not taking: Reported on 05/31/2020)   glucose blood test strip Test blood sugars up to 4 times a day   Lancets (FREESTYLE) lancets Use as instructed   metoprolol succinate (TOPROL-XL) 50 MG 24 hr tablet TAKE 1 TABLET BY MOUTH DAILY. TAKE WITH OR IMMEDIATELY FOLLOWING A MEAL (AM)   nitroGLYCERIN (NITROSTAT) 0.4 MG SL tablet Place 1 tablet (0.4 mg total) under the tongue every 5 (five) minutes as needed for chest pain.   NONFORMULARY OR COMPOUNDED ITEM Antifungal solution: Terbinafine 3%, Fluconazole 2%, Tea Tree Oil 5%, Urea 10%, Ibuprofen 2% in DMSO suspension #75m (Patient not taking: Reported on 05/31/2020)   pantoprazole (PROTONIX) 40 MG tablet TAKE ONE TABLET BY MOUTH DAILY IN THE MORNING.   potassium chloride SA (KLOR-CON) 20 MEQ tablet TAKE ONE TABLET BY MOUTH ONCE DAILY (AM)   rosuvastatin (CRESTOR) 20 MG tablet TAKE ONE TABLET BY MOUTH ONCE DAILY (BEDTIME)   Vitamin D, Ergocalciferol, (DRISDOL) 1.25 MG (50000 UNIT) CAPS capsule TAKE 1 CAPSULE TWICE A WEEK ( MONDAY & THURSDAY)   No facility-administered encounter medications on file as of 12/30/2020.    Current Diagnosis: Patient Active Problem List   Diagnosis Date Noted   Skull mass 02/27/2019   [place] of clivus ([place] 02/23/2019   Anxiety about health 02/09/2019   Brain tumor ([place] 02/04/2019   Arm bruise, left, initial encounter 01/07/2019   Anxiety 12/30/2018   Decreased estrogen level 12/23/2018   Abnormal blood chemistry 12/23/2018   Depression 12/23/2018   CNS mass 12/15/2018   Stroke ([place] 12/15/2018   Hypertensive retinopathy 12/08/2018   Palpitations 07/16/2018  Cerebral thrombosis with cerebral infarction 07/12/2018   Chest pain 07/11/2018   Abnormal EKG 07/11/2018   AKI (acute kidney injury) ([place]) 07/11/2018   Essential hypertension    Retrognathia 07/10/2018   Psychophysiological insomnia 07/10/2018   Snoring 07/10/2018   History of lupus  01/02/2012   CAD (coronary artery disease)    Type 2 diabetes mellitus without complication, without long-term current use of insulin (HCC)    Hypertensive heart disease without CHF    Hyperlipidemia    Obesity (BMI 30-39.9)    GERD (gastroesophageal reflux disease)    Osteoarthritis     Goals Addressed   None     Follow-Up:  Patient Assistance Coordination-Notified [name], CPP.  Raynelle Highland, [place] Pharmacist Assistant [phone]

## 2021-01-06 ENCOUNTER — Other Ambulatory Visit: Payer: Self-pay | Admitting: Radiation Therapy

## 2021-01-06 ENCOUNTER — Telehealth: Payer: Self-pay | Admitting: Radiation Therapy

## 2021-01-06 NOTE — Telephone Encounter (Signed)
Spoke with Bridget Good about her upcoming brain MRI and follow-up visit with Dr. Mickeal Skinner.   Mont Dutton R.T.(R)(T) Radiation Special Procedures Navigator

## 2021-01-11 ENCOUNTER — Encounter: Payer: PPO | Admitting: Nurse Practitioner

## 2021-01-12 ENCOUNTER — Other Ambulatory Visit: Payer: Self-pay | Admitting: Nurse Practitioner

## 2021-01-12 DIAGNOSIS — Z23 Encounter for immunization: Secondary | ICD-10-CM

## 2021-01-13 ENCOUNTER — Telehealth: Payer: Self-pay

## 2021-01-13 ENCOUNTER — Telehealth: Payer: PPO

## 2021-01-13 NOTE — Telephone Encounter (Signed)
  Chronic Care Management   Outreach Note  01/13/2021 Name: Bridget Good MRN: [id number] DOB: February 28, 1951  Referred by: [name], FNP Reason for referral : Care Coordination   Third unsuccessful telephone outreach was attempted today. The patient was referred to the case management team for assistance with care management and care coordination. The patient's primary care provider has been notified of our unsuccessful attempts to make or maintain contact with the patient. The care management team is pleased to engage with this patient at any time in the future should he/she be interested in assistance from the care management team.   Follow Up Plan: No SW follow up planned at this time due to an inability to establish patient contact. Patient will remain active with RN Care Manager.  Daneen Schick, BSW, CDP Social Worker, Certified Dementia Practitioner [place] [facility] [place] [facility] [phone]

## 2021-01-18 ENCOUNTER — Other Ambulatory Visit: Payer: Self-pay | Admitting: Nurse Practitioner

## 2021-01-19 ENCOUNTER — Other Ambulatory Visit: Payer: Self-pay | Admitting: Nurse Practitioner

## 2021-01-19 DIAGNOSIS — Z1231 Encounter for screening mammogram for malignant neoplasm of breast: Secondary | ICD-10-CM

## 2021-01-19 DIAGNOSIS — E2839 Other primary ovarian failure: Secondary | ICD-10-CM

## 2021-01-20 ENCOUNTER — Inpatient Hospital Stay: Admission: RE | Admit: 2021-01-20 | Payer: PPO | Source: Ambulatory Visit

## 2021-01-20 ENCOUNTER — Telehealth: Payer: Self-pay

## 2021-01-20 NOTE — Chronic Care Management (AMB) (Signed)
° ° °Chronic Care Management °Pharmacy Assistant  ° °Name: Bridget Good  MRN: [id number] DOB: 04/27/1951 ° °Reason for Encounter: Hypertension Adherence Call ° °Patient Questions: ° 1.  Have you seen any other providers since your last visit? No  ° ° 2.  Any changes in your medicines or health? No ° ° °PCP : Moore, Janece, FNP ° °Allergies:   °Allergies  °Allergen Reactions  °• Sulfa Antibiotics Hives, Itching and Nausea And Vomiting  ° ° °Medications: °Outpatient Encounter Medications as of 01/20/2021  °Medication Sig  °• acetaminophen (TYLENOL) 500 MG tablet Take 1 tablet (500 mg total) by mouth as needed for mild pain.  °• amLODipine-benazepril (LOTREL) 10-40 MG capsule TAKE 1 CAPSULE BY MOUTH DAILY.(AM)  °• ASPIRIN LOW DOSE 81 MG EC tablet TAKE ONE TABLET BY MOUTH ONCE DAILY (AM)  °• blood glucose meter kit and supplies Dispense based on patient and insurance preference. Use up to four times daily as directed. (FOR ICD-10 E10.9, E11.9).  °• Blood Glucose Monitoring Suppl (FREESTYLE LITE) DEVI Check blood sugars twice daily  °• ciclopirox (PENLAC) 8 % solution Apply topically at bedtime. Apply over nail and surrounding skin. Apply daily over previous coat. Remove weekly with polish remover. (Patient not taking: Reported on 05/31/2020)  °• clopidogrel (PLAVIX) 75 MG tablet TAKE ONE TABLET BY MOUTH ONCE DAILY (AM)  °• clotrimazole-betamethasone (LOTRISONE) cream Apply to both feet and between toes bid x 4 weeks. (Patient not taking: Reported on 05/11/2020)  °• diclofenac Sodium (VOLTAREN) 1 % GEL APPLY 2 GRAMS TOPICALLY 4 (FOUR) TIMES DAILY.  °• empagliflozin (JARDIANCE) 10 MG TABS tablet Take 10 mg by mouth daily.  °• escitalopram (LEXAPRO) 5 MG tablet TAKE ONE TABLET BY MOUTH DAILY AT BEDTIME.  °• fluticasone (FLONASE) 50 MCG/ACT nasal spray Place into both nostrils daily. (Patient not taking: Reported on 05/31/2020)  °• Ginsengs-Royal Jelly-Vit B12 (GINSENG ROYAL JELLY PLUS) 375-1-15 MG-MG-MCG CAPS Take 1 capsule  by mouth daily. (Patient not taking: Reported on 05/31/2020)  °• glucose blood test strip Test blood sugars up to 4 times a day  °• Lancets (FREESTYLE) lancets Use as instructed  °• metoprolol succinate (TOPROL-XL) 50 MG 24 hr tablet TAKE 1 TABLET BY MOUTH DAILY. TAKE WITH OR IMMEDIATELY FOLLOWING A MEAL (AM)  °• nitroGLYCERIN (NITROSTAT) 0.4 MG SL tablet Place 1 tablet (0.4 mg total) under the tongue every 5 (five) minutes as needed for chest pain.  °• NONFORMULARY OR COMPOUNDED ITEM Antifungal solution: Terbinafine 3%, Fluconazole 2%, Tea Tree Oil 5%, Urea 10%, Ibuprofen 2% in DMSO suspension #30mL (Patient not taking: Reported on 05/31/2020)  °• pantoprazole (PROTONIX) 40 MG tablet TAKE ONE TABLET BY MOUTH DAILY IN THE MORNING.  °• potassium chloride SA (KLOR-CON) 20 MEQ tablet TAKE ONE TABLET BY MOUTH ONCE DAILY (AM)  °• rosuvastatin (CRESTOR) 20 MG tablet TAKE ONE TABLET BY MOUTH ONCE DAILY (BEDTIME)  °• Vitamin D, Ergocalciferol, (DRISDOL) 1.25 MG (50000 UNIT) CAPS capsule TAKE 1 CAPSULE TWICE A WEEK ( MONDAY & THURSDAY)  ° °No facility-administered encounter medications on file as of 01/20/2021.  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  °• Anxiety about health 02/09/2019  °• Brain tumor (HCC) 02/04/2019  °• Arm bruise, left, initial encounter 01/07/2019  °• Anxiety 12/30/2018  °• Decreased estrogen level 12/23/2018  °• Abnormal blood chemistry 12/23/2018  °• Depression 12/23/2018  °• CNS mass 12/15/2018  °•   Stroke (HCC) 12/15/2018  °• Hypertensive retinopathy 12/08/2018  °• Palpitations 07/16/2018  °• Cerebral thrombosis with cerebral infarction 07/12/2018  °• Chest pain 07/11/2018  °• Abnormal EKG 07/11/2018  °• AKI (acute kidney injury) (HCC) 07/11/2018  °• Essential hypertension   °• Retrognathia 07/10/2018  °• Psychophysiological insomnia 07/10/2018  °• Snoring 07/10/2018  °• History of lupus 01/02/2012  °•  CAD (coronary artery disease)   °• Type 2 diabetes mellitus without complication, without long-term current use of insulin (HCC)   °• Hypertensive heart disease without CHF   °• Hyperlipidemia   °• Obesity (BMI 30-39.9)   °• GERD (gastroesophageal reflux disease)   °• Osteoarthritis   ° °Reviewed chart prior to disease state call. Spoke with patient regarding BP ° °Recent Office Vitals: °BP Readings from Last 3 Encounters:  °06/14/20 127/86  °05/11/20 132/80  °02/01/20 136/67  ° °Pulse Readings from Last 3 Encounters:  °06/14/20 (!) 45  °05/11/20 93  °02/01/20 68  °  °Wt Readings from Last 3 Encounters:  °05/11/20 199 lb 9.6 oz (90.5 kg)  °02/01/20 198 lb 12.8 oz (90.2 kg)  °01/11/20 193 lb 3.2 oz (87.6 kg)  °  ° °Kidney Function °Lab Results  °Component Value Date/Time  ° CREATININE 1.41 (H) 01/11/2020 11:48 AM  ° CREATININE 1.30 (H) 10/07/2019 02:16 PM  ° GFRNONAA 39 (L) 01/11/2020 11:48 AM  ° GFRAA 45 (L) 01/11/2020 11:48 AM  ° ° °BMP Latest Ref Rng & Units 01/11/2020 10/07/2019 05/14/2019  °Glucose 65 - 99 mg/dL 170(H) 128(H) 139(H)  °BUN 8 - 27 mg/dL 16 16 16  °Creatinine 0.57 - 1.00 mg/dL 1.41(H) 1.30(H) 1.14(H)  °BUN/Creat Ratio 12 - 28 11(L) 12 14  °Sodium 134 - 144 mmol/L 139 140 141  °Potassium 3.5 - 5.2 mmol/L 4.1 4.4 3.7  °Chloride 96 - 106 mmol/L 104 104 101  °CO2 20 - 29 mmol/L 23 22 23  °Calcium 8.7 - 10.3 mg/dL 9.8 9.5 9.7  ° ° °• Current antihypertensive regimen:  °? Amlodipine/benazepril 10/40mg daily °? Metoprolol succinate 50mg daily with or immediately following a meal ° °• How often are you checking your Blood Pressure? Patient reports she is not checking her BP like she use to and has a difficult time operating the BP Cuff.   ° °• Current home BP readings: None.  ° °• What recent interventions/DTPs have been made by any provider to improve Blood Pressure control since last CPP Visit:  °- Patient reports she is taking her medications as directed.  ° °• Any recent hospitalizations or ED visits since  last visit with CPP? No  ° °• What diet changes have been made to improve Blood Pressure Control?  °o Patient reports she has cut down on the amount of eating. Patient states she eats a sandwich from [employer], doesn't consume soda as much, does drink a lot of water, husband typically cooks baked chicken, greens salads, does cook a lot of heart healthy meals. ° °• What exercise is being done to improve your Blood Pressure Control?  °o Patient reports she is active throughout the day with house chores and cleaning behind her grandchildren.  ° °Adherence Review: °Is the patient currently on ACE/ARB medication? No  ° °Does the patient have >5 day gap between last estimated fill dates? Yes ° ° ° °Goals Addressed   °  °  °  °  ° This Visit's Progress  ° • Pharmacy Care Plan   On track  °  CARE PLAN   ENTRY  Current Barriers:   Chronic Disease Management support, education, and care coordination needs related to Hypertension, Hyperlipidemia, and Diabetes   Hypertension  Pharmacist Clinical Goal(s): o Over the next 90 days, patient will work with PharmD and providers to maintain BP goal <130/80  Current regimen:  o Amlodipine/benazepril 10/9m daily o Metoprolol succinate 598mdaily with or immediately following a meal  Interventions: o Extensive dietary and exercise recommendations provided o Advised patient that I will assist with using home blood pressure monitor if she will come into the office  Patient self care activities - Over the next 90 days, patient will: o Check BP as needed if symptomatic, document, and provide at future appointments o Ensure daily salt intake < 2300 mg/day  Hyperlipidemia  Pharmacist Clinical Goal(s): o Over the next 90 days, patient will work with PharmD and providers to achieve LDL goal < 70  Current regimen:  o Rosuvastatin 201mt bedtime  Interventions: o Extensive dietary and exercise recommendations provided o Monitor next lipid panel to determine  appropriateness of statin regimen  Patient self care activities - Over the next 90 days, patient will: o Increase exercise to 10 minutes 5 times weekly (with eventual goal of 30 minutes 5 times weekly) o Limit eating out to 3 times weekly o Limit the use of sauces and dressings  Diabetes  Pharmacist Clinical Goal(s): o Over the next 90 days, patient will work with PharmD and providers to achieve A1c goal <7%  Current regimen:  o Jardiance 51m5mily  Interventions: o Encouraged taking medication daily as directed o Extensive dietary and exercise recommendations provided o Samples of Jardiance available at the office o Will assist in applying for patient assistance for Jardiance through [employer] needs to provide proof of income documents) o Counseled patient on use of blood glucose monitor and how to obtain more blood when pricking finger o Advised patient I will assist with using blood glucose meter if she will come into the office o Contacted [employer] and determined that patient is eligible for Silver Sneakers benefit, she just needs to call the activate it. Notified patient o Not eligible for CGM due to not being on insulin  Patient self care activities - Over the next 45 days, patient will: o Check blood sugar once daily, document, and provide at future appointments o Massage finger from base to tip after pricking it to obtain a larger droplet of blood o Increase exercise to 10 minutes 5 times weekly o Limit carbohydrates (pasta, bread, potatoes, rice, etc) o Switch out lettuce for buns and bread when eating burgers and sandwiches o Limit sauces and dressings o Contact provider with any episodes of hypoglycemia o Provide proof of income documents for Jardiance patient assistance program  Medication management  Pharmacist Clinical Goal(s): o Over the next 45 days, patient will work with PharmD and providers to achieve optimal medication  adherence  [facility]  Interventions o Comprehensive medication review performed o Utilize [facility] for medication synchronization, packaging and delivery o Provided education regarding proper use of diclofenac/Voltaren and advised patient that these are the same medication o Instructed patient to follow up with podiatry for refills of antifungal medications for feet  Patient self care activities - Over the next 45 days, patient will: o Take medications as prescribed o Report any questions or concerns to PharmD and/or provider(s)  Please see past updates related to this goal by clicking on the "Past Updates"  button in the selected goal   °  °  ° ° °Follow-Up:  Pharmacist Review-Patient reports she has been feeling frustrated and overwhelmed these past couple of days but voiced she is doing much better mentally wise.  ° °Patient voiced she is using a bubble pack system through [facility] and verbalized that it is very helpful to her. Patient also voiced she is seeking to referred out to have a mammogram done. Communicated with the patient I will relay this request to Vallie Pearson, CPP to speak with Janece Moore, FNP regarding this; the patient voiced understanding. ° °Informed the patient we are in needing her to sign and provide proof of income to complete Jardiance Assistance application. Patient voiced she completely forgot to come into the office and take care of but will try to come into the office one day this week.  ° ° °Vallie Pearson, CPP Notified ° °Candice Biancardi, CMA °Clinical Pharmacist Assistant °[phone] °CCM Total Time: 38 minutes  ° °

## 2021-01-23 ENCOUNTER — Inpatient Hospital Stay: Payer: PPO | Attending: Internal Medicine | Admitting: Internal Medicine

## 2021-01-23 ENCOUNTER — Inpatient Hospital Stay: Payer: PPO

## 2021-01-24 ENCOUNTER — Telehealth: Payer: Self-pay

## 2021-01-24 NOTE — Telephone Encounter (Signed)
Pt called to find out her appt pt was not scheduled. Scheduled pt an appt couldn't leave vm it was full

## 2021-01-25 ENCOUNTER — Telehealth: Payer: Self-pay

## 2021-01-25 ENCOUNTER — Telehealth: Payer: PPO

## 2021-01-25 NOTE — Telephone Encounter (Cosign Needed)
  Chronic Care Management   Outreach Note  01/25/2021 Name: Bridget Good MRN: [id number] DOB: 12-13-1950  Referred by: [name], FNP Reason for referral : Chronic Care Management (RN CM FU Call )   An unsuccessful telephone outreach was attempted today. The patient was referred to the case management team for assistance with care management and care coordination.   Follow Up Plan: Telephone follow up appointment with care management team member scheduled for: 02/23/21  Barb Merino, RN, BSN, CCM Care Management Coordinator [place] [facility]  Direct Phone: [phone]

## 2021-01-31 ENCOUNTER — Telehealth: Payer: Self-pay

## 2021-01-31 NOTE — Chronic Care Management (AMB) (Addendum)
Chronic Care Management Pharmacy Assistant   Name: Bridget Good  MRN: [id number] DOB: 1951/08/19  Reason for Encounter: Patient Assistance Coordination   PCP : [name], FNP  Allergies:   Allergies  Allergen Reactions   Sulfa Antibiotics Hives, Itching and Nausea And Vomiting    Medications: Outpatient Encounter Medications as of 01/31/2021  Medication Sig   acetaminophen (TYLENOL) 500 MG tablet Take 1 tablet (500 mg total) by mouth as needed for mild pain.   amLODipine-benazepril (LOTREL) 10-40 MG capsule TAKE 1 CAPSULE BY MOUTH DAILY.(AM)   ASPIRIN LOW DOSE 81 MG EC tablet TAKE ONE TABLET BY MOUTH ONCE DAILY (AM)   blood glucose meter kit and supplies Dispense based on patient and insurance preference. Use up to four times daily as directed. (FOR ICD-10 E10.9, E11.9).   Blood Glucose Monitoring Suppl (FREESTYLE LITE) DEVI Check blood sugars twice daily   ciclopirox (PENLAC) 8 % solution Apply topically at bedtime. Apply over nail and surrounding skin. Apply daily over previous coat. Remove weekly with polish remover. (Patient not taking: Reported on 05/31/2020)   clopidogrel (PLAVIX) 75 MG tablet TAKE ONE TABLET BY MOUTH ONCE DAILY (AM)   clotrimazole-betamethasone (LOTRISONE) cream Apply to both feet and between toes bid x 4 weeks. (Patient not taking: Reported on 05/11/2020)   diclofenac Sodium (VOLTAREN) 1 % GEL APPLY 2 GRAMS TOPICALLY 4 (FOUR) TIMES DAILY.   empagliflozin (JARDIANCE) 10 MG TABS tablet Take 10 mg by mouth daily.   escitalopram (LEXAPRO) 5 MG tablet TAKE ONE TABLET BY MOUTH DAILY AT BEDTIME.   fluticasone (FLONASE) 50 MCG/ACT nasal spray Place into both nostrils daily. (Patient not taking: Reported on 05/31/2020)   Ginsengs-Royal Jelly-Vit B12 (GINSENG ROYAL JELLY PLUS) 375-1-15 MG-MG-MCG CAPS Take 1 capsule by mouth daily. (Patient not taking: Reported on 05/31/2020)   glucose blood test strip Test blood sugars up to 4 times a day   Lancets (FREESTYLE)  lancets Use as instructed   metoprolol succinate (TOPROL-XL) 50 MG 24 hr tablet TAKE 1 TABLET BY MOUTH DAILY. TAKE WITH OR IMMEDIATELY FOLLOWING A MEAL (AM)   nitroGLYCERIN (NITROSTAT) 0.4 MG SL tablet Place 1 tablet (0.4 mg total) under the tongue every 5 (five) minutes as needed for chest pain.   NONFORMULARY OR COMPOUNDED ITEM Antifungal solution: Terbinafine 3%, Fluconazole 2%, Tea Tree Oil 5%, Urea 10%, Ibuprofen 2% in DMSO suspension #22m (Patient not taking: Reported on 05/31/2020)   pantoprazole (PROTONIX) 40 MG tablet TAKE ONE TABLET BY MOUTH DAILY IN THE MORNING.   potassium chloride SA (KLOR-CON) 20 MEQ tablet TAKE ONE TABLET BY MOUTH ONCE DAILY (AM)   rosuvastatin (CRESTOR) 20 MG tablet TAKE ONE TABLET BY MOUTH ONCE DAILY (BEDTIME)   Vitamin D, Ergocalciferol, (DRISDOL) 1.25 MG (50000 UNIT) CAPS capsule TAKE 1 CAPSULE TWICE A WEEK ( MONDAY & THURSDAY)   No facility-administered encounter medications on file as of 01/31/2021.    Current Diagnosis: Patient Active Problem List   Diagnosis Date Noted   Skull mass 02/27/2019   [place] of clivus ([place] 02/23/2019   Anxiety about health 02/09/2019   [place]rain tumor ([place] 02/04/2019   Arm bruise, left, initial encounter 01/07/2019   Anxiety 12/30/2018   Decreased estrogen level 12/23/2018   Abnormal blood chemistry 12/23/2018   Depression 12/23/2018   CNS mass 12/15/2018   Stroke ([place] 12/15/2018   Hypertensive retinopathy 12/08/2018   Palpitations 07/16/2018   Cerebral thrombosis with cerebral infarction 07/12/2018   Chest pain 07/11/2018   Abnormal EKG 07/11/2018  AKI (acute kidney injury) ([place]) 07/11/2018   Essential hypertension    Retrognathia 07/10/2018   Psychophysiological insomnia 07/10/2018   Snoring 07/10/2018   History of lupus 01/02/2012   CAD (coronary artery disease)    Type 2 diabetes mellitus without complication, without long-term current use of insulin (HCC)    Hypertensive heart disease without CHF     Hyperlipidemia    Obesity (BMI 30-39.9)    GERD (gastroesophageal reflux disease)    Osteoarthritis     01/31/21-Attempted to outreach the patient to see if she can come by PCP's office to provide proof of income and to sign application forms. No answer; left a voicemail with a call back number.  Follow-Up:  Pharmacist Review-Spoke with the patient regarding her patient assistance application for Jardiance. Per patient; her and husband are without transportation at this time but will attempt to come into PCP's office next week to provide proof of income and sign application.    [name], CPP Notified.  Raynelle Highland, [place] Pharmacist Assistant [phone] CCM Total Time: 9 minutes  I have reviewed the care management and care coordination activities outlined in this encounter and I am certifying that I agree with the content of this note. No further action required.  2 minutes spent in review, coordination, and documentation.  Mayford Knife, [facility] 02/06/21 8:39 AM

## 2021-02-01 ENCOUNTER — Ambulatory Visit: Payer: PPO | Admitting: Nurse Practitioner

## 2021-02-09 ENCOUNTER — Ambulatory Visit: Payer: PPO

## 2021-02-09 ENCOUNTER — Telehealth: Payer: Self-pay

## 2021-02-09 NOTE — Telephone Encounter (Signed)
This nurse attempted to call patient in regards to missing today's appointment. I called both numbers in the chart. Both numbers had mailboxes that were filled. Unable to leave a message.

## 2021-02-23 ENCOUNTER — Telehealth: Payer: Self-pay

## 2021-02-23 ENCOUNTER — Telehealth: Payer: PPO

## 2021-02-23 NOTE — Telephone Encounter (Signed)
  Chronic Care Management   Outreach Note  02/23/2021 Name: Bridget Good MRN: [id number] DOB: 01-05-1951  Referred by: [name], FNP Reason for referral : Chronic Care Management (RN CM FU Call )   A second unsuccessful telephone outreach was attempted today. The patient was referred to the case management team for assistance with care management and care coordination.   Follow Up Plan: A HIPAA compliant phone message was left for the patient providing contact information and requesting a return call. Telephone follow up appointment with care management team member scheduled for: 03/30/21  Barb Merino, RN, BSN, CCM Care Management Coordinator [place] [facility]  Direct Phone: [phone]

## 2021-03-10 ENCOUNTER — Other Ambulatory Visit: Payer: Self-pay | Admitting: Nurse Practitioner

## 2021-03-10 DIAGNOSIS — E119 Type 2 diabetes mellitus without complications: Secondary | ICD-10-CM | POA: Diagnosis not present

## 2021-03-10 DIAGNOSIS — E785 Hyperlipidemia, unspecified: Secondary | ICD-10-CM | POA: Diagnosis not present

## 2021-03-10 DIAGNOSIS — F32A Depression, unspecified: Secondary | ICD-10-CM

## 2021-03-10 DIAGNOSIS — I208 Other forms of angina pectoris: Secondary | ICD-10-CM | POA: Diagnosis not present

## 2021-03-10 DIAGNOSIS — F419 Anxiety disorder, unspecified: Secondary | ICD-10-CM

## 2021-03-10 DIAGNOSIS — I10 Essential (primary) hypertension: Secondary | ICD-10-CM | POA: Diagnosis not present

## 2021-03-13 ENCOUNTER — Telehealth: Payer: Self-pay | Admitting: Nurse Practitioner

## 2021-03-13 NOTE — Telephone Encounter (Signed)
Left message for patient to call back and schedule Medicare Annual Wellness Visit (AWV) either virtually or in office.   Last AWV 10/07/2019  [facility]    This should be a 45 minute visit.

## 2021-03-30 ENCOUNTER — Ambulatory Visit: Payer: Self-pay

## 2021-03-30 ENCOUNTER — Telehealth: Payer: PPO

## 2021-03-30 DIAGNOSIS — E119 Type 2 diabetes mellitus without complications: Secondary | ICD-10-CM

## 2021-03-30 DIAGNOSIS — G3184 Mild cognitive impairment, so stated: Secondary | ICD-10-CM

## 2021-03-30 DIAGNOSIS — E782 Mixed hyperlipidemia: Secondary | ICD-10-CM

## 2021-03-30 DIAGNOSIS — I10 Essential (primary) hypertension: Secondary | ICD-10-CM

## 2021-03-30 DIAGNOSIS — E559 Vitamin D deficiency, unspecified: Secondary | ICD-10-CM

## 2021-03-30 NOTE — Chronic Care Management (AMB) (Signed)
  Chronic Care Management   Outreach Note  03/30/2021 Name: NAVIKA HOOPES MRN: [id number] DOB: 02-23-1951  Referred by: [name], FNP Reason for referral : Chronic Care Management (Case Closure)   Third unsuccessful telephone outreach was attempted today. The patient was referred to the case management team for assistance with care management and care coordination. The patient's primary care provider has been notified of our unsuccessful attempts to make or maintain contact with the patient. The care management team is pleased to engage with this patient at any time in the future should he/she be interested in assistance from the care management team.   Follow Up Plan: We have been unable to make contact with the patient for follow up. The care management team is available to follow up with the patient after provider conversation with the patient regarding recommendation for care management engagement and subsequent re-referral to the care management team.   Barb Merino, RN, BSN, CCM Care Management Coordinator [facility]  Direct Phone: [phone]

## 2021-05-01 ENCOUNTER — Telehealth: Payer: Self-pay

## 2021-05-01 NOTE — Telephone Encounter (Signed)
lvm for patient to return call to schedule appt not seen since 05/11/20

## 2021-05-02 ENCOUNTER — Other Ambulatory Visit: Payer: Self-pay | Admitting: Nurse Practitioner

## 2021-05-02 DIAGNOSIS — F419 Anxiety disorder, unspecified: Secondary | ICD-10-CM

## 2021-05-02 DIAGNOSIS — F32A Depression, unspecified: Secondary | ICD-10-CM

## 2021-06-06 ENCOUNTER — Telehealth: Payer: Self-pay | Admitting: Nurse Practitioner

## 2021-06-06 NOTE — Telephone Encounter (Signed)
Left message for patient to call back and schedule Medicare Annual Wellness Visit (AWV) either virtually or in office.   Last AWV 10/07/2019  please schedule at anytime with [facility]   Patient also needs appointment with pcp last appointment 05/11/20  This should be a 45 minute visit.

## 2021-06-21 ENCOUNTER — Encounter: Payer: Self-pay | Admitting: Nurse Practitioner

## 2021-06-21 ENCOUNTER — Inpatient Hospital Stay: Admission: RE | Admit: 2021-06-21 | Payer: PPO | Source: Ambulatory Visit

## 2021-06-26 ENCOUNTER — Ambulatory Visit: Payer: PPO | Admitting: Nurse Practitioner

## 2021-06-27 ENCOUNTER — Encounter: Payer: Self-pay | Admitting: Nurse Practitioner

## 2021-06-27 ENCOUNTER — Ambulatory Visit (INDEPENDENT_AMBULATORY_CARE_PROVIDER_SITE_OTHER): Payer: Medicare HMO | Admitting: Nurse Practitioner

## 2021-06-27 ENCOUNTER — Other Ambulatory Visit: Payer: Self-pay

## 2021-06-27 VITALS — BP 130/76 | Temp 98.7°F | Ht 64.0 in | Wt 188.6 lb

## 2021-06-27 DIAGNOSIS — E119 Type 2 diabetes mellitus without complications: Secondary | ICD-10-CM

## 2021-06-27 DIAGNOSIS — G9689 Other specified disorders of central nervous system: Secondary | ICD-10-CM

## 2021-06-27 DIAGNOSIS — R413 Other amnesia: Secondary | ICD-10-CM | POA: Diagnosis not present

## 2021-06-27 DIAGNOSIS — I10 Essential (primary) hypertension: Secondary | ICD-10-CM

## 2021-06-27 DIAGNOSIS — E559 Vitamin D deficiency, unspecified: Secondary | ICD-10-CM | POA: Diagnosis not present

## 2021-06-27 DIAGNOSIS — E669 Obesity, unspecified: Secondary | ICD-10-CM

## 2021-06-27 DIAGNOSIS — Z6832 Body mass index (BMI) 32.0-32.9, adult: Secondary | ICD-10-CM

## 2021-06-27 DIAGNOSIS — H539 Unspecified visual disturbance: Secondary | ICD-10-CM | POA: Diagnosis not present

## 2021-06-27 DIAGNOSIS — F419 Anxiety disorder, unspecified: Secondary | ICD-10-CM

## 2021-06-27 DIAGNOSIS — E782 Mixed hyperlipidemia: Secondary | ICD-10-CM

## 2021-06-27 DIAGNOSIS — G3184 Mild cognitive impairment, so stated: Secondary | ICD-10-CM

## 2021-06-27 DIAGNOSIS — F32A Depression, unspecified: Secondary | ICD-10-CM

## 2021-06-27 DIAGNOSIS — Z23 Encounter for immunization: Secondary | ICD-10-CM

## 2021-06-27 MED ORDER — PNEUMOCOCCAL 13-VAL CONJ VACC IM SUSP: 0.5000 mL | INTRAMUSCULAR | 0 refills | Status: AC

## 2021-06-27 MED ORDER — TETANUS-DIPHTH-ACELL PERTUSSIS 5-2.5-18.5 LF-MCG/0.5 IM SUSP: 0.5000 mL | Freq: Once | INTRAMUSCULAR | 0 refills | Status: AC

## 2021-06-27 MED ORDER — ESCITALOPRAM OXALATE 5 MG PO TABS: 5.0000 mg | ORAL_TABLET | Freq: Every day | ORAL | 1 refills | Status: DC

## 2021-06-27 MED ORDER — EMPAGLIFLOZIN 10 MG PO TABS: 10.0000 mg | ORAL_TABLET | Freq: Every day | ORAL | 3 refills | Status: DC

## 2021-06-27 NOTE — Progress Notes (Signed)
I,Bridget Good,acting as a Education administrator for [employer], FNP.,have documented all relevant documentation on the behalf of Bridget Brine, FNP,as directed by  Bridget Brine, FNP while in the presence of Bridget Good, [place].  This visit occurred during the SARS-CoV-2 public health emergency.  Safety protocols were in place, including screening questions prior to the visit, additional usage of staff PPE, and extensive cleaning of exam room while observing appropriate contact time as indicated for disinfecting solutions.  Subjective:     Patient ID: Bridget Good , female    DOB: August 24, 1951 , 70 y.o.   MRN: [id number]   Chief Complaint  Patient presents with   Hypertension   Diabetes    HPI  She reports she seen Bridget Good for intermittent chest pain. Daughter is her with her. Her children are concerned with her cognition.  She has not been seen in more than a year. Her son is the first number to call. She does get pill pack will have to get prompting from others to take. She continues to have trouble with her glucometer.  She has not been back to Dr Bridget Good; she was due for another MRI this year. She is not eating regularly even when her children bring her food. She is not currently exercising.   She is reporting she has dots when looking ahead.  She has not been to the eye doctor was going to [facility].   She is no longer driving due to her children  being concerned. When she was driving last year. Her anxiety was getting out of control with the people driving beside and behind them.    Diabetes She presents for her follow-up diabetic visit. She has type 2 diabetes mellitus. Her disease course has been improving. There are no hypoglycemic associated symptoms. Pertinent negatives for hypoglycemia include no confusion, dizziness, headaches or nervousness/anxiousness. There are no diabetic associated symptoms. Pertinent negatives for diabetes include no chest pain, no fatigue and no weakness.  There are no hypoglycemic complications. Symptoms are stable. There are no diabetic complications. Risk factors for coronary artery disease include post-menopausal, hypertension, sedentary lifestyle and obesity. Current diabetic treatment includes oral agent (dual therapy). She is compliant with treatment most of the time. She has not had a previous visit with a dietitian. She rarely participates in exercise. (She has not been checking her blood sugar due to not knowing how to use the machine) She does not see a podiatrist.Eye exam is not current.  Hypertension This is a chronic problem. The current episode started more than 1 year ago. The problem is unchanged. The problem is controlled. Pertinent negatives include no anxiety, chest pain, headaches, palpitations or shortness of breath. Risk factors for coronary artery disease include sedentary lifestyle, obesity and diabetes mellitus. There are no compliance problems.  There is no history of angina. There is no history of chronic renal disease.    Past Medical History:  Diagnosis Date   Angina    Anxiety    Bronchitis    Depression    Diabetes mellitus    Fibromyalgia    GERD (gastroesophageal reflux disease)    Headache(784.0)    Hyperlipidemia    Hypertension    Hypertensive heart disease without CHF    Lupus ([place])    "treated for it from 1992 til 2012; dr said I don't have it anymore"   Obesity (BMI 30-39.9)    Osteoarthritis    Pneumonia    Shortness of breath    lying  down, upon exertion   Shortness of breath on exertion    Stroke [facility])    2019     Family History  Problem Relation Age of Onset   Heart attack Father    Diabetes Father    Hypertension Mother    Heart attack Brother    Kidney failure Brother    Diabetes Sister    Diabetes Sister      Current Outpatient Medications:    acetaminophen (TYLENOL) 500 MG tablet, Take 1 tablet (500 mg total) by mouth as needed for mild pain., Disp: 90 tablet, Rfl: 1    amLODipine-benazepril (LOTREL) 10-40 MG capsule, TAKE 1 CAPSULE BY MOUTH DAILY.(AM), Disp: 90 capsule, Rfl: 1   ASPIRIN LOW DOSE 81 MG EC tablet, TAKE ONE TABLET BY MOUTH ONCE DAILY (AM), Disp: 90 tablet, Rfl: 1   blood glucose meter kit and supplies, Dispense based on patient and insurance preference. Use up to four times daily as directed. (FOR ICD-10 E10.9, E11.9)., Disp: 1 each, Rfl: 0   Blood Glucose Monitoring Suppl (FREESTYLE LITE) DEVI, Check blood sugars twice daily, Disp: 1 each, Rfl: 1   ciclopirox (PENLAC) 8 % solution, Apply topically at bedtime. Apply over nail and surrounding skin. Apply daily over previous coat. Remove weekly with polish remover. (Patient not taking: Reported on 05/31/2020), Disp: 6.6 mL, Rfl: 11   clopidogrel (PLAVIX) 75 MG tablet, TAKE ONE TABLET BY MOUTH ONCE DAILY (AM), Disp: 90 tablet, Rfl: 1   clotrimazole-betamethasone (LOTRISONE) cream, Apply to both feet and between toes bid x 4 weeks. (Patient not taking: Reported on 05/11/2020), Disp: 45 g, Rfl: 1   diclofenac Sodium (VOLTAREN) 1 % GEL, APPLY 2 GRAMS TOPICALLY 4 (FOUR) TIMES DAILY., Disp: 100 g, Rfl: 2   empagliflozin (JARDIANCE) 10 MG TABS tablet, Take 1 tablet (10 mg total) by mouth daily., Disp: 90 tablet, Rfl: 3   escitalopram (LEXAPRO) 5 MG tablet, Take 1 tablet (5 mg total) by mouth at bedtime., Disp: 90 tablet, Rfl: 1   fluticasone (FLONASE) 50 MCG/ACT nasal spray, Place into both nostrils daily. (Patient not taking: Reported on 05/31/2020), Disp: , Rfl:    Ginsengs-Royal Jelly-Vit B12 (GINSENG ROYAL JELLY PLUS) 375-1-15 MG-MG-MCG CAPS, Take 1 capsule by mouth daily. (Patient not taking: Reported on 05/31/2020), Disp: 90 capsule, Rfl: 1   glucose blood test strip, Test blood sugars up to 4 times a day, Disp: 100 each, Rfl: 12   Lancets (FREESTYLE) lancets, Use as instructed, Disp: 100 each, Rfl: 12   metoprolol succinate (TOPROL-XL) 50 MG 24 hr tablet, TAKE 1 TABLET BY MOUTH DAILY. TAKE WITH OR IMMEDIATELY  FOLLOWING A MEAL (AM), Disp: 90 tablet, Rfl: 1   nitroGLYCERIN (NITROSTAT) 0.4 MG SL tablet, Place 1 tablet (0.4 mg total) under the tongue every 5 (five) minutes as needed for chest pain., Disp: 25 tablet, Rfl: 12   NONFORMULARY OR COMPOUNDED ITEM, Antifungal solution: Terbinafine 3%, Fluconazole 2%, Tea Tree Oil 5%, Urea 10%, Ibuprofen 2% in DMSO suspension #2m (Patient not taking: Reported on 05/31/2020), Disp: 1 each, Rfl: 3   pantoprazole (PROTONIX) 40 MG tablet, TAKE ONE TABLET BY MOUTH DAILY IN THE MORNING., Disp: 90 tablet, Rfl: 1   potassium chloride SA (KLOR-CON) 20 MEQ tablet, TAKE ONE TABLET BY MOUTH ONCE DAILY (AM), Disp: 90 tablet, Rfl: 1   rosuvastatin (CRESTOR) 20 MG tablet, TAKE ONE TABLET BY MOUTH ONCE DAILY (BEDTIME), Disp: 90 tablet, Rfl: 1   Vitamin D, Ergocalciferol, (DRISDOL) 1.25 MG (50000 UNIT) CAPS capsule,  TAKE 1 CAPSULE TWICE A WEEK ( MONDAY & THURSDAY), Disp: 24 capsule, Rfl: 1   Allergies  Allergen Reactions   Sulfa Antibiotics Hives, Itching and Nausea And Vomiting     Review of Systems  Constitutional: Negative.  Negative for fatigue.  Respiratory: Negative.  Negative for shortness of breath.   Cardiovascular: Negative.  Negative for chest pain and palpitations.  Gastrointestinal: Negative.   Neurological: Negative.  Negative for dizziness, weakness and headaches.  Psychiatric/Behavioral:  Negative for confusion. The patient is not nervous/anxious.     Today's Vitals   06/27/21 1539  BP: 130/76  Temp: 98.7 F (37.1 C)  TempSrc: Oral  Weight: 188 lb 9.6 oz (85.5 kg)  Height: '5\' 4"'  (1.626 m)   Body mass index is 32.37 kg/m.  Wt Readings from Last 3 Encounters:  06/27/21 188 lb 9.6 oz (85.5 kg)  05/11/20 199 lb 9.6 oz (90.5 kg)  02/01/20 198 lb 12.8 oz (90.2 kg)    Objective:  Physical Exam Vitals reviewed.  Constitutional:      General: She is not in acute distress.    Appearance: Normal appearance.  Cardiovascular:     Rate and Rhythm:  Normal rate and regular rhythm.     Pulses: Normal pulses.     Heart sounds: Normal heart sounds. No murmur heard. Pulmonary:     Effort: Pulmonary effort is normal. No respiratory distress.     Breath sounds: Normal breath sounds. No wheezing.  Skin:    Capillary Refill: Capillary refill takes less than 2 seconds.  Neurological:     General: No focal deficit present.     Mental Status: She is alert and oriented to person, place, and time.     Cranial Nerves: No cranial nerve deficit.     Motor: No weakness.  Psychiatric:        Mood and Affect: Mood normal.        Behavior: Behavior normal.        Thought Content: Thought content normal.        Judgment: Judgment normal.        Assessment And Plan:     1. Type 2 diabetes mellitus without complication, without long-term current use of insulin (HCC) Comments: will check HgbA1c and make changes to medications pending results - Hemoglobin A1c - Ambulatory referral to [facility] - empagliflozin (JARDIANCE) 10 MG TABS tablet; Take 1 tablet (10 mg total) by mouth daily.  Dispense: 90 tablet; Refill: 3  2. Essential hypertension Comments: Fair control Continue with current medications - CMP14+EGFR - Ambulatory referral to [facility]  3. Mild cognitive impairment Comments: Her memory is worsening, we will try her on donepezil  Her children are becoming more involved Will add home health to see if can help with medication management - Vitamin B12 - VITAMIN D 25 Hydroxy (Vit-D Deficiency, Fractures) - TSH - CBC - Ambulatory referral to [place]  4. Mixed hyperlipidemia Comments: Will check lipid panel - Lipid panel  5. Vitamin D deficiency Will check vitamin D level and supplement as needed.    Also encouraged to spend 15 minutes in the sun daily.  - VITAMIN D 25 Hydroxy (Vit-D Deficiency, Fractures) - CBC  6. Vision disturbance - Hemoglobin A1c - Ambulatory referral to Ophthalmology  7. CNS mass Comments: she  had been followed by Dr. Mickeal Good and is to have a repeat CT scan  8. Depression, unspecified depression type Comments: Has not taken the antidepressant Encouraged to take daily  to see if this helps mood - escitalopram (LEXAPRO) 5 MG tablet; Take 1 tablet (5 mg total) by mouth at bedtime.  Dispense: 90 tablet; Refill: 1  9. Anxiety - escitalopram (LEXAPRO) 5 MG tablet; Take 1 tablet (5 mg total) by mouth at bedtime.  Dispense: 90 tablet; Refill: 1  10. Obesity (BMI 30-39.9) Encouraged to increase physical activity  11. Encounter for immunization - pneumococcal 13-valent conjugate vaccine (PREVNAR 13) SUSP injection; Inject 0.5 mLs into the muscle tomorrow at 10 am for 1 dose.  Dispense: 0.5 mL; Refill: 0 - Tdap (BOOSTRIX) 5-2.5-18.5 LF-MCG/0.5 injection; Inject 0.5 mLs into the muscle once for 1 dose.  Dispense: 0.5 mL; Refill: 0  She is encouraged to strive for BMI less than 30 to decrease cardiac risk. Advised to aim for at least 150 minutes of exercise per week.    Patient was given opportunity to ask questions. Patient verbalized understanding of the plan and was able to repeat key elements of the plan. All questions were answered to their satisfaction.  Bridget Brine, FNP   I, Bridget Brine, FNP, have reviewed all documentation for this visit. The documentation on 07/04/21 for the exam, diagnosis, procedures, and orders are all accurate and complete.   IF YOU HAVE BEEN REFERRED TO A SPECIALIST, IT MAY TAKE 1-2 WEEKS TO SCHEDULE/PROCESS THE REFERRAL. IF YOU HAVE NOT HEARD FROM US/SPECIALIST IN TWO WEEKS, PLEASE GIVE [place] A CALL AT [phone] X [phone].   THE PATIENT IS ENCOURAGED TO PRACTICE SOCIAL DISTANCING DUE TO THE COVID-19 PANDEMIC.

## 2021-06-27 NOTE — Patient Instructions (Signed)

## 2021-06-28 LAB — CMP14+EGFR
ALT: 11 IU/L (ref 0–32)
AST: 16 IU/L (ref 0–40)
Albumin/Globulin Ratio: 1.5 (ref 1.2–2.2)
Albumin: 4.1 g/dL (ref 3.8–4.8)
Alkaline Phosphatase: 90 IU/L (ref 44–121)
BUN/Creatinine Ratio: 8 — ABNORMAL LOW (ref 12–28)
BUN: 10 mg/dL (ref 8–27)
Bilirubin Total: 0.8 mg/dL (ref 0.0–1.2)
CO2: 22 mmol/L (ref 20–29)
Calcium: 9.5 mg/dL (ref 8.7–10.3)
Chloride: 106 mmol/L (ref 96–106)
Creatinine, Ser: 1.28 mg/dL — ABNORMAL HIGH (ref 0.57–1.00)
Globulin, Total: 2.7 g/dL (ref 1.5–4.5)
Glucose: 208 mg/dL — ABNORMAL HIGH (ref 65–99)
Potassium: 4.2 mmol/L (ref 3.5–5.2)
Sodium: 143 mmol/L (ref 134–144)
Total Protein: 6.8 g/dL (ref 6.0–8.5)
eGFR: 46 mL/min/{1.73_m2} — ABNORMAL LOW (ref >59)

## 2021-06-28 LAB — CBC
Hematocrit: 44.3 % (ref 34.0–46.6)
Hemoglobin: 14.3 g/dL (ref 11.1–15.9)
MCH: 29.4 pg (ref 26.6–33.0)
MCHC: 32.3 g/dL (ref 31.5–35.7)
MCV: 91 fL (ref 79–97)
Platelets: 284 10*3/uL (ref 150–450)
RBC: 4.87 x10E6/uL (ref 3.77–5.28)
RDW: 13.6 % (ref 11.7–15.4)
WBC: 4.9 10*3/uL (ref 3.4–10.8)

## 2021-06-28 LAB — HEMOGLOBIN A1C
Est. average glucose Bld gHb Est-mCnc: 186 mg/dL
Hgb A1c MFr Bld: 8.1 % — ABNORMAL HIGH (ref 4.8–5.6)

## 2021-06-28 LAB — LIPID PANEL
Chol/HDL Ratio: 2.9 ratio (ref 0.0–4.4)
Cholesterol, Total: 226 mg/dL — ABNORMAL HIGH (ref 100–199)
HDL: 77 mg/dL (ref >39)
LDL Chol Calc (NIH): 126 mg/dL — ABNORMAL HIGH (ref 0–99)
Triglycerides: 134 mg/dL (ref 0–149)
VLDL Cholesterol Cal: 23 mg/dL (ref 5–40)

## 2021-06-28 LAB — VITAMIN B12: Vitamin B-12: 343 pg/mL (ref 232–1245)

## 2021-06-28 LAB — VITAMIN D 25 HYDROXY (VIT D DEFICIENCY, FRACTURES): Vit D, 25-Hydroxy: 53.2 ng/mL (ref 30.0–100.0)

## 2021-06-28 LAB — TSH: TSH: 0.964 u[IU]/mL (ref 0.450–4.500)

## 2021-08-18 ENCOUNTER — Telehealth: Payer: Self-pay

## 2021-08-18 NOTE — Chronic Care Management (AMB) (Signed)
Patient had a gap in medication fill on the rosuvastatin and amlodipine. Left patient a voicemail asking when the last fill on medications were then called [facility] to confirm. Pharmacist stated patient picked up a 30 day supply on both medications on 08-04-21. Updated gap report sheet.  [place] Pharmacist Assistant [phone]

## 2021-08-24 ENCOUNTER — Other Ambulatory Visit: Payer: Self-pay

## 2021-08-24 ENCOUNTER — Ambulatory Visit (INDEPENDENT_AMBULATORY_CARE_PROVIDER_SITE_OTHER): Payer: Medicare (Managed Care)

## 2021-08-24 VITALS — BP 142/76 | HR 60 | Temp 98.7°F | Ht 63.2 in | Wt 188.0 lb

## 2021-08-24 DIAGNOSIS — Z23 Encounter for immunization: Secondary | ICD-10-CM

## 2021-08-24 DIAGNOSIS — Z0000 Encounter for general adult medical examination without abnormal findings: Secondary | ICD-10-CM

## 2021-08-24 DIAGNOSIS — E119 Type 2 diabetes mellitus without complications: Secondary | ICD-10-CM | POA: Diagnosis not present

## 2021-08-24 LAB — POCT URINALYSIS DIPSTICK
Bilirubin, UA: NEGATIVE
Blood, UA: NEGATIVE
Glucose, UA: NEGATIVE
Leukocytes, UA: NEGATIVE
Nitrite, UA: NEGATIVE
Protein, UA: POSITIVE — AB
Spec Grav, UA: 1.025 (ref 1.010–1.025)
Urobilinogen, UA: 1 E.U./dL
pH, UA: 5 (ref 5.0–8.0)

## 2021-08-24 LAB — POCT UA - MICROALBUMIN
Creatinine, POC: 300 mg/dL
Microalbumin Ur, POC: 80 mg/L

## 2021-08-24 MED ORDER — PREVNAR 20 0.5 ML IM SUSY: 0.5000 mL | PREFILLED_SYRINGE | INTRAMUSCULAR | 0 refills | Status: AC

## 2021-08-24 MED ORDER — BOOSTRIX 5-2.5-18.5 LF-MCG/0.5 IM SUSP: 0.5000 mL | Freq: Once | INTRAMUSCULAR | 0 refills | Status: AC

## 2021-08-24 NOTE — Addendum Note (Signed)
Addended by: Kellie Simmering on: 08/24/2021 04:52 PM   Modules accepted: Orders

## 2021-08-24 NOTE — Progress Notes (Signed)
This visit occurred during the SARS-CoV-2 public health emergency.  Safety protocols were in place, including screening questions prior to the visit, additional usage of staff PPE, and extensive cleaning of exam room while observing appropriate contact time as indicated for disinfecting solutions.  Subjective:   Bridget Good is a 70 y.o. female who presents for Medicare Annual (Subsequent) preventive examination.  Review of Systems     Cardiac Risk Factors include: advanced age (>52mn, >>55women);diabetes mellitus;obesity (BMI >30kg/m2);sedentary lifestyle     Objective:    Today's Vitals   08/24/21 1553 08/24/21 1634  BP: (!) 150/80 (!) 142/76  Pulse: 60   Temp: 99 F (37.2 C) 98.7 F (37.1 C)  TempSrc: Oral Oral  SpO2: 97%   Weight: 188 lb (85.3 kg)   Height: 5' 3.2" (1.605 m)    Body mass index is 33.09 kg/m.  Advanced Directives 08/24/2021 06/14/2020 10/07/2019 04/17/2019 03/09/2019 02/27/2019 02/27/2019  Does Patient Have a Medical Advance Directive? No No Yes No Yes Yes Yes  Type of Advance Directive - - - - [place]Press photographerLiving will [place] will [place] will  Does patient want to make changes to medical advance directive? - - Yes (MAU/Ambulatory/Procedural Areas - Information given) - - No - Patient declined -  Copy of [place] Chart? - - - - No - copy requested No - copy requested No - copy requested  Would patient like information on creating a medical advance directive? No - Patient declined Yes (ED - send information to MyChart) - - - - -  Pre-existing out of facility DNR order (yellow form or pink MOST form) - - - - - - -    Current Medications (verified) Outpatient Encounter Medications as of 08/24/2021  Medication Sig   acetaminophen (TYLENOL) 500 MG tablet Take 1 tablet (500 mg total) by mouth as needed for mild pain.   amLODipine-benazepril (LOTREL) 10-40 MG capsule  TAKE 1 CAPSULE BY MOUTH DAILY.(AM)   ASPIRIN LOW DOSE 81 MG EC tablet TAKE ONE TABLET BY MOUTH ONCE DAILY (AM)   blood glucose meter kit and supplies Dispense based on patient and insurance preference. Use up to four times daily as directed. (FOR ICD-10 E10.9, E11.9).   Blood Glucose Monitoring Suppl (FREESTYLE LITE) DEVI Check blood sugars twice daily   clopidogrel (PLAVIX) 75 MG tablet TAKE ONE TABLET BY MOUTH ONCE DAILY (AM)   empagliflozin (JARDIANCE) 10 MG TABS tablet Take 1 tablet (10 mg total) by mouth daily.   escitalopram (LEXAPRO) 5 MG tablet Take 1 tablet (5 mg total) by mouth at bedtime.   glucose blood test strip Test blood sugars up to 4 times a day   Lancets (FREESTYLE) lancets Use as instructed   metoprolol succinate (TOPROL-XL) 50 MG 24 hr tablet TAKE 1 TABLET BY MOUTH DAILY. TAKE WITH OR IMMEDIATELY FOLLOWING A MEAL (AM)   nitroGLYCERIN (NITROSTAT) 0.4 MG SL tablet Place 1 tablet (0.4 mg total) under the tongue every 5 (five) minutes as needed for chest pain.   pantoprazole (PROTONIX) 40 MG tablet TAKE ONE TABLET BY MOUTH DAILY IN THE MORNING.   pneumococcal 20-Val Conj Vacc (PREVNAR 20) 0.5 ML injection Inject 0.5 mLs into the muscle tomorrow at 10 am for 1 dose.   potassium chloride SA (KLOR-CON) 20 MEQ tablet TAKE ONE TABLET BY MOUTH ONCE DAILY (AM)   rosuvastatin (CRESTOR) 20 MG tablet TAKE ONE TABLET BY MOUTH ONCE DAILY (BEDTIME)  Tdap (BOOSTRIX) 5-2.5-18.5 LF-MCG/0.5 injection Inject 0.5 mLs into the muscle once for 1 dose.   Vitamin D, Ergocalciferol, (DRISDOL) 1.25 MG (50000 UNIT) CAPS capsule TAKE 1 CAPSULE TWICE A WEEK ( MONDAY & THURSDAY)   ciclopirox (PENLAC) 8 % solution Apply topically at bedtime. Apply over nail and surrounding skin. Apply daily over previous coat. Remove weekly with polish remover. (Patient not taking: No sig reported)   clotrimazole-betamethasone (LOTRISONE) cream Apply to both feet and between toes bid x 4 weeks. (Patient not taking: No sig  reported)   diclofenac Sodium (VOLTAREN) 1 % GEL APPLY 2 GRAMS TOPICALLY 4 (FOUR) TIMES DAILY. (Patient not taking: Reported on 08/24/2021)   fluticasone (FLONASE) 50 MCG/ACT nasal spray Place into both nostrils daily. (Patient not taking: No sig reported)   Ginsengs-Royal Jelly-Vit B12 (GINSENG ROYAL JELLY PLUS) 375-1-15 MG-MG-MCG CAPS Take 1 capsule by mouth daily. (Patient not taking: No sig reported)   NONFORMULARY OR COMPOUNDED ITEM Antifungal solution: Terbinafine 3%, Fluconazole 2%, Tea Tree Oil 5%, Urea 10%, Ibuprofen 2% in DMSO suspension #30m (Patient not taking: No sig reported)   No facility-administered encounter medications on file as of 08/24/2021.    Allergies (verified) Sulfa antibiotics   History: Past Medical History:  Diagnosis Date   Angina    Anxiety    Bronchitis    Depression    Diabetes mellitus    Fibromyalgia    GERD (gastroesophageal reflux disease)    Headache(784.0)    Hyperlipidemia    Hypertension    Hypertensive heart disease without CHF    Lupus ([place]    "treated for it from 1992 til 2012; dr said I don't have it anymore"   Obesity (BMI 30-39.9)    Osteoarthritis    Pneumonia    Shortness of breath    lying down, upon exertion   Shortness of breath on exertion    Stroke ([place]    2019   Past Surgical History:  Procedure Laterality Date   ABDOMINAL HYSTERECTOMY     partial   CARDIAC CATHETERIZATION  ~ 2007   CESAREAN SECTION  1978; 1981   COLONOSCOPY     CRANIOTOMY N/A 02/27/2019   Procedure: Endonasal Endoscopic biopsy of clival mass;  Surgeon: OJudith Part MD;  [facility]  Service: Neurosurgery;  Laterality: N/A;  Endonasal Endoscopic biopsy of clival mass   ENDOSCOPIC TRANS NASAL APPROACH WITH FUSION N/A 02/27/2019   Procedure: ENDOSCOPIC TRANS NASAL APPROACH WITH FUSION;  Surgeon: OJudith Part MD;  [facility]  Service: Neurosurgery;  Laterality: N/A;  ENDOSCOPIC TRANS NASAL APPROACH WITH FUSION   FOOT SURGERY      "had to cut it to let the fluids out; it had swollen very badly; left foot"   Family History  Problem Relation Age of Onset   Heart attack Father    Diabetes Father    Hypertension Mother    Heart attack Brother    Kidney failure Brother    Diabetes Sister    Diabetes Sister    Social History   Socioeconomic History   Marital status: Married    Spouse name: Not on file   Number of children: 2   Years of education: Not on file   Highest education level: Not on file  Occupational History   Occupation: retired  Tobacco Use   Smoking status: Never   Smokeless tobacco: Never  Vaping Use   Vaping Use: Never used  Substance and Sexual Activity   Alcohol use:  No    Alcohol/week: 0.0 standard drinks   Drug use: No   Sexual activity: Yes    Partners: Male    Birth control/protection: Surgical  Other Topics Concern   Not on file  Social History Narrative   Married.  Husband is psychotherapist.  Several children.  Husband is Theme park manager of [employer] in [place].   Social Determinants of Health   Financial Resource Strain: Low Risk    Difficulty of Paying Living Expenses: Not hard at all  Food Insecurity: No Food Insecurity   Worried About Charity fundraiser in the Last Year: Never true   [place] in the Last Year: Never true  Transportation Needs: No Transportation Needs   Lack of Transportation (Medical): No   Lack of Transportation (Non-Medical): No  Physical Activity: Inactive   Days of Exercise per Week: 0 days   Minutes of Exercise per Session: 0 min  Stress: No Stress Concern Present   Feeling of Stress : Not at all  Social Connections: Not on file    Tobacco Counseling Counseling given: Not Answered   Clinical Intake:  Pre-visit preparation completed: Yes  Pain : No/denies pain     Nutritional Status: BMI > 30  Obese Nutritional Risks: None Diabetes: Yes  How often do you need to have someone help you when you read instructions,  pamphlets, or other written materials from your doctor or pharmacy?: 1 - Never What is the last grade level you completed in school?: college  Diabetic? Yes Nutrition Risk Assessment:  Has the patient had any N/V/D within the last 2 months?  No  Does the patient have any non-healing wounds?  No  Has the patient had any unintentional weight loss or weight gain?  Yes   Diabetes:  Is the patient diabetic?  Yes  If diabetic, was a CBG obtained today?  No  Did the patient bring in their glucometer from home?  No  How often do you monitor your CBG's? Does not.   Financial Strains and Diabetes Management:  Are you having any financial strains with the device, your supplies or your medication? No .  Does the patient want to be seen by Chronic Care Management for management of their diabetes?  No  Would the patient like to be referred to a Nutritionist or for Diabetic Management?  No   Diabetic Exams:  Diabetic Eye Exam: Overdue for diabetic eye exam. Pt has been advised about the importance in completing this exam. Patient advised to call and schedule an eye exam. Diabetic Foot Exam: Overdue, Pt has been advised about the importance in completing this exam. Pt is scheduled for diabetic foot exam on next appointment.   Interpreter Needed?: No  Information entered by :: NAllen LPN   Activities of Daily Living In your present state of health, do you have any difficulty performing the following activities: 08/24/2021 06/27/2021  Hearing? N N  Vision? N Y  Difficulty concentrating or making decisions? Tempie Donning  Walking or climbing stairs? Y Y  Dressing or bathing? N N  Doing errands, shopping? Tempie Donning  Preparing Food and eating ? Y -  Using the Toilet? N -  In the past six months, have you accidently leaked urine? N -  Do you have problems with loss of bowel control? N -  Managing your Medications? Y -  Managing your Finances? Y -  Housekeeping or managing your Housekeeping? Y -  Some recent  data might  be hidden    Patient Care Team: [name], FNP as PCP - General (General Practice) Nahser, Wonda Cheng, MD as PCP - Cardiology (Cardiology) Jacolyn Reedy, MD as Consulting Physician (Cardiology)  Indicate any recent Medical Services you may have received from other than [facility] providers in the past year (date may be approximate).     Assessment:   This is a routine wellness examination for Bridget Good.  Hearing/Vision screen Vision Screening - Comments:: No regular eye exams,  Dietary issues and exercise activities discussed: Current Exercise Habits: The patient does not participate in regular exercise at present   Goals Addressed             This Visit's Progress    Patient Stated       08/24/2021, no goals       Depression Screen PHQ 2/9 Scores 08/24/2021 06/27/2021 05/11/2020 01/11/2020 10/07/2019 10/07/2019 05/14/2019  PHQ - 2 Score 0 1 0 0 2 1 0  PHQ- 9 Score - 6 1 0 3 3 -    Fall Risk Fall Risk  08/24/2021 06/27/2021 01/11/2020 12/17/2019 10/07/2019  Falls in the past year? 0 0 0 0 0  Number falls in past yr: - 0 - - 0  Injury with Fall? - 0 - - -  Risk Factor Category  - - - - -  Risk for fall due to : Medication side effect - - - Medication side effect  Follow up Falls evaluation completed;Education provided;Falls prevention discussed - - - Falls evaluation completed;Education provided;Falls prevention discussed    FALL RISK PREVENTION PERTAINING TO THE HOME:  Any stairs in or around the home? Yes  If so, are there any without handrails? Yes  Home free of loose throw rugs in walkways, pet beds, electrical cords, etc? Yes  Adequate lighting in your home to reduce risk of falls? Yes   ASSISTIVE DEVICES UTILIZED TO PREVENT FALLS:  Life alert? No  Use of a cane, walker or w/c? Yes  Grab bars in the bathroom? Yes  Shower chair or bench in shower? Yes  Elevated toilet seat or a handicapped toilet? No   TIMED UP AND GO:  Was the test performed? No .     Gait slow and steady with assistive device  Cognitive Function: MMSE - Mini Mental State Exam 12/17/2019 06/16/2019  Orientation to time 4 4  Orientation to Place 4 5  Registration 3 3  Attention/ Calculation 5 5  Recall 1 0  Language- name 2 objects 2 2  Language- repeat 1 1  Language- follow 3 step command 3 2  Language- read & follow direction 0 0  Write a sentence 1 1  Copy design 1 1  Total score 25 24     6CIT Screen 06/27/2021 10/07/2019  What Year? 0 points 0 points  What month? 0 points 0 points  What time? 0 points 0 points  Count back from 20 4 points 0 points  Months in reverse 4 points 0 points  Repeat phrase 10 points 6 points  Total Score 18 6    Immunizations Immunization History  Administered Date(s) Administered   Fluad Quad(high Dose 65+) 08/24/2021   Influenza Inj Mdck Quad Pf 01/02/2018   Influenza, High Dose Seasonal PF 09/19/2018   Influenza,inj,Quad PF,6+ Mos 12/06/2016   Moderna Sars-Covid-2 Vaccination 02/01/2020, 03/15/2020, 08/23/2020   Pneumococcal Polysaccharide-23 11/26/2018    TDAP status: Due, Education has been provided regarding the importance of this vaccine. Advised may receive this vaccine  at local pharmacy or Health Dept. Aware to provide a copy of the vaccination record if obtained from local pharmacy or Health Dept. Verbalized acceptance and understanding.  Flu Vaccine status: Completed at today's visit  Pneumococcal vaccine status: Declined,  Education has been provided regarding the importance of this vaccine but patient still declined. Advised may receive this vaccine at local pharmacy or Health Dept. Aware to provide a copy of the vaccination record if obtained from local pharmacy or Health Dept. Verbalized acceptance and understanding.   Covid-19 vaccine status: Completed vaccines  Qualifies for Shingles Vaccine? Yes   Zostavax completed No   Shingrix Completed?: No.    Education has been provided regarding the importance  of this vaccine. Patient has been advised to call insurance company to determine out of pocket expense if they have not yet received this vaccine. Advised may also receive vaccine at local pharmacy or Health Dept. Verbalized acceptance and understanding.  Screening Tests Health Maintenance  Topic Date Due   FOOT EXAM  Never done   TETANUS/TDAP  Never done   Zoster Vaccines- Shingrix (1 of 2) Never done   DEXA SCAN  Never done   OPHTHALMOLOGY EXAM  06/26/2019   PNA vac Low Risk Adult (2 of 2 - PCV13) 11/27/2019   COVID-19 Vaccine (4 - Booster for Moderna series) 11/15/2020   MAMMOGRAM  01/30/2021   HEMOGLOBIN A1C  12/28/2021   COLONOSCOPY (Pts 45-106yr Insurance coverage will need to be confirmed)  03/29/2025   INFLUENZA VACCINE  Completed   Hepatitis C Screening  Completed   HPV VACCINES  Aged Out    Health Maintenance  Health Maintenance Due  Topic Date Due   FOOT EXAM  Never done   TETANUS/TDAP  Never done   Zoster Vaccines- Shingrix (1 of 2) Never done   DEXA SCAN  Never done   OPHTHALMOLOGY EXAM  06/26/2019   PNA vac Low Risk Adult (2 of 2 - PCV13) 11/27/2019   COVID-19 Vaccine (4 - Booster for Moderna series) 11/15/2020   MAMMOGRAM  01/30/2021    Colorectal cancer screening: Type of screening: Colonoscopy. Completed 03/30/2015. Repeat every 10 years  Mammogram status: patient to schedule  Bone Density status: due  Lung Cancer Screening: (Low Dose CT Chest recommended if Age 68-80years, 30 pack-year currently smoking OR have quit w/in 15years.) does not qualify.   Lung Cancer Screening Referral: no  Additional Screening:  Hepatitis C Screening: does qualify; Completed 05/14/2019  Vision Screening: Recommended annual ophthalmology exams for early detection of glaucoma and other disorders of the eye. Is the patient up to date with their annual eye exam?  No  Who is the provider or what is the name of the office in which the patient attends annual eye exams? none If  pt is not established with a provider, would they like to be referred to a provider to establish care? No .   Dental Screening: Recommended annual dental exams for proper oral hygiene  Community Resource Referral / Chronic Care Management: CRR required this visit?  No   CCM required this visit?  No      Plan:     I have personally reviewed and noted the following in the patient's chart:   Medical and social history Use of alcohol, tobacco or illicit drugs  Current medications and supplements including opioid prescriptions.  Functional ability and status Nutritional status Physical activity Advanced directives List of other physicians Hospitalizations, surgeries, and ER visits in previous 12 months  Vitals Screenings to include cognitive, depression, and falls Referrals and appointments  In addition, I have reviewed and discussed with patient certain preventive protocols, quality metrics, and best practice recommendations. A written personalized care plan for preventive services as well as general preventive health recommendations were provided to patient.     Kellie Simmering, LPN   [id number]   Nurse Notes: 6 CIT not administered. Some cognitive decline noted via direct observation

## 2021-08-24 NOTE — Patient Instructions (Signed)
[name] , Thank you for taking time to come for your Medicare Wellness Visit. I appreciate your ongoing commitment to your health goals. Please review the following plan we discussed and let me know if I can assist you in the future.   Screening recommendations/referrals: Colonoscopy: completed 03/30/2015 Mammogram: daughter to schedule Bone Density: due Recommended yearly ophthalmology/optometry visit for glaucoma screening and checkup Recommended yearly dental visit for hygiene and checkup  Vaccinations: Influenza vaccine: today Pneumococcal vaccine: sent to pharmacy Tdap vaccine: sent to pharmacy Shingles vaccine: discussed    Covid-19:08/23/2020, 03/15/2020, 02/01/2020  Advanced directives: Advance directive discussed with you today. Even though you declined this today please call our office should you change your mind and we can give you the proper paperwork for you to fill out.  Conditions/risks identified: none  Next appointment: Follow up in one year for your annual wellness visit    Preventive Care 65 Years and Older, Female Preventive care refers to lifestyle choices and visits with your health care provider that can promote health and wellness. What does preventive care include? A yearly physical exam. This is also called an annual well check. Dental exams once or twice a year. Routine eye exams. Ask your health care provider how often you should have your eyes checked. Personal lifestyle choices, including: Daily care of your teeth and gums. Regular physical activity. Eating a healthy diet. Avoiding tobacco and drug use. Limiting alcohol use. Practicing safe sex. Taking low-dose aspirin every day. Taking vitamin and mineral supplements as recommended by your health care provider. What happens during an annual well check? The services and screenings done by your health care provider during your annual well check will depend on your age, overall health, lifestyle risk  factors, and family history of disease. Counseling  Your health care provider may ask you questions about your: Alcohol use. Tobacco use. Drug use. Emotional well-being. Home and relationship well-being. Sexual activity. Eating habits. History of falls. Memory and ability to understand (cognition). Work and work Statistician. Reproductive health. Screening  You may have the following tests or measurements: Height, weight, and BMI. Blood pressure. Lipid and cholesterol levels. These may be checked every 5 years, or more frequently if you are over 47 years old. Skin check. Lung cancer screening. You may have this screening every year starting at age 36 if you have a 30-pack-year history of smoking and currently smoke or have quit within the past 15 years. Fecal occult blood test (FOBT) of the stool. You may have this test every year starting at age 22. Flexible sigmoidoscopy or colonoscopy. You may have a sigmoidoscopy every 5 years or a colonoscopy every 10 years starting at age 74. Hepatitis C blood test. Hepatitis B blood test. Sexually transmitted disease (STD) testing. Diabetes screening. This is done by checking your blood sugar (glucose) after you have not eaten for a while (fasting). You may have this done every 1-3 years. Bone density scan. This is done to screen for osteoporosis. You may have this done starting at age 39. Mammogram. This may be done every 1-2 years. Talk to your health care provider about how often you should have regular mammograms. Talk with your health care provider about your test results, treatment options, and if necessary, the need for more tests. Vaccines  Your health care provider may recommend certain vaccines, such as: Influenza vaccine. This is recommended every year. Tetanus, diphtheria, and acellular pertussis (Tdap, Td) vaccine. You may need a Td booster every 10 years. Zoster  vaccine. You may need this after age 57. Pneumococcal 13-valent  conjugate (PCV13) vaccine. One dose is recommended after age 61. Pneumococcal polysaccharide (PPSV23) vaccine. One dose is recommended after age 83. Talk to your health care provider about which screenings and vaccines you need and how often you need them. This information is not intended to replace advice given to you by your health care provider. Make sure you discuss any questions you have with your health care provider. Document Released: 12/23/2015 Document Revised: 08/15/2016 Document Reviewed: 09/27/2015 Elsevier Interactive Patient Education  2017 [place] Prevention in the Home Falls can cause injuries. They can happen to people of all ages. There are many things you can do to make your home safe and to help prevent falls. What can I do on the outside of my home? Regularly fix the edges of walkways and driveways and fix any cracks. Remove anything that might make you trip as you walk through a door, such as a raised step or threshold. Trim any bushes or trees on the path to your home. Use bright outdoor lighting. Clear any walking paths of anything that might make someone trip, such as rocks or tools. Regularly check to see if handrails are loose or broken. Make sure that both sides of any steps have handrails. Any raised decks and porches should have guardrails on the edges. Have any leaves, snow, or ice cleared regularly. Use sand or salt on walking paths during winter. Clean up any spills in your garage right away. This includes oil or grease spills. What can I do in the bathroom? Use night lights. Install grab bars by the toilet and in the tub and shower. Do not use towel bars as grab bars. Use non-skid mats or decals in the tub or shower. If you need to sit down in the shower, use a plastic, non-slip stool. Keep the floor dry. Clean up any water that spills on the floor as soon as it happens. Remove soap buildup in the tub or shower regularly. Attach bath mats  securely with double-sided non-slip rug tape. Do not have throw rugs and other things on the floor that can make you trip. What can I do in the bedroom? Use night lights. Make sure that you have a light by your bed that is easy to reach. Do not use any sheets or blankets that are too big for your bed. They should not hang down onto the floor. Have a firm chair that has side arms. You can use this for support while you get dressed. Do not have throw rugs and other things on the floor that can make you trip. What can I do in the kitchen? Clean up any spills right away. Avoid walking on wet floors. Keep items that you use a lot in easy-to-reach places. If you need to reach something above you, use a strong step stool that has a grab bar. Keep electrical cords out of the way. Do not use floor polish or wax that makes floors slippery. If you must use wax, use non-skid floor wax. Do not have throw rugs and other things on the floor that can make you trip. What can I do with my stairs? Do not leave any items on the stairs. Make sure that there are handrails on both sides of the stairs and use them. Fix handrails that are broken or loose. Make sure that handrails are as long as the stairways. Check any carpeting to make sure that it  is firmly attached to the stairs. Fix any carpet that is loose or worn. Avoid having throw rugs at the top or bottom of the stairs. If you do have throw rugs, attach them to the floor with carpet tape. Make sure that you have a light switch at the top of the stairs and the bottom of the stairs. If you do not have them, ask someone to add them for you. What else can I do to help prevent falls? Wear shoes that: Do not have high heels. Have rubber bottoms. Are comfortable and fit you well. Are closed at the toe. Do not wear sandals. If you use a stepladder: Make sure that it is fully opened. Do not climb a closed stepladder. Make sure that both sides of the stepladder  are locked into place. Ask someone to hold it for you, if possible. Clearly mark and make sure that you can see: Any grab bars or handrails. First and last steps. Where the edge of each step is. Use tools that help you move around (mobility aids) if they are needed. These include: Canes. Walkers. Scooters. Crutches. Turn on the lights when you go into a dark area. Replace any light bulbs as soon as they burn out. Set up your furniture so you have a clear path. Avoid moving your furniture around. If any of your floors are uneven, fix them. If there are any pets around you, be aware of where they are. Review your medicines with your doctor. Some medicines can make you feel dizzy. This can increase your chance of falling. Ask your doctor what other things that you can do to help prevent falls. This information is not intended to replace advice given to you by your health care provider. Make sure you discuss any questions you have with your health care provider. Document Released: 09/22/2009 Document Revised: 05/03/2016 Document Reviewed: 12/31/2014 Elsevier Interactive Patient Education  2017 [employer].

## 2021-09-18 ENCOUNTER — Ambulatory Visit: Payer: Medicare (Managed Care) | Admitting: Podiatry

## 2021-09-18 ENCOUNTER — Other Ambulatory Visit: Payer: Self-pay | Admitting: Nurse Practitioner

## 2021-09-18 DIAGNOSIS — E559 Vitamin D deficiency, unspecified: Secondary | ICD-10-CM

## 2021-10-09 ENCOUNTER — Telehealth: Payer: Self-pay

## 2021-10-09 MED ORDER — ROSUVASTATIN CALCIUM 20 MG PO TABS: ORAL_TABLET | ORAL | 1 refills | Status: DC

## 2021-10-09 NOTE — Telephone Encounter (Signed)
- 

## 2021-10-16 ENCOUNTER — Ambulatory Visit (INDEPENDENT_AMBULATORY_CARE_PROVIDER_SITE_OTHER): Payer: Medicare (Managed Care) | Admitting: Nurse Practitioner

## 2021-10-16 ENCOUNTER — Other Ambulatory Visit: Payer: Self-pay

## 2021-10-16 ENCOUNTER — Encounter: Payer: Self-pay | Admitting: Nurse Practitioner

## 2021-10-16 VITALS — BP 114/70 | HR 89 | Temp 98.6°F | Ht 63.2 in | Wt 184.2 lb

## 2021-10-16 DIAGNOSIS — E6609 Other obesity due to excess calories: Secondary | ICD-10-CM

## 2021-10-16 DIAGNOSIS — E1122 Type 2 diabetes mellitus with diabetic chronic kidney disease: Secondary | ICD-10-CM

## 2021-10-16 DIAGNOSIS — I10 Essential (primary) hypertension: Secondary | ICD-10-CM

## 2021-10-16 DIAGNOSIS — Z23 Encounter for immunization: Secondary | ICD-10-CM

## 2021-10-16 DIAGNOSIS — I129 Hypertensive chronic kidney disease with stage 1 through stage 4 chronic kidney disease, or unspecified chronic kidney disease: Secondary | ICD-10-CM

## 2021-10-16 DIAGNOSIS — N1831 Chronic kidney disease, stage 3a: Secondary | ICD-10-CM

## 2021-10-16 DIAGNOSIS — Z6832 Body mass index (BMI) 32.0-32.9, adult: Secondary | ICD-10-CM | POA: Diagnosis not present

## 2021-10-16 DIAGNOSIS — E119 Type 2 diabetes mellitus without complications: Secondary | ICD-10-CM | POA: Diagnosis not present

## 2021-10-16 DIAGNOSIS — E782 Mixed hyperlipidemia: Secondary | ICD-10-CM

## 2021-10-16 MED ORDER — RYBELSUS 7 MG PO TABS: 1.0000 | ORAL_TABLET | Freq: Every day | ORAL | 1 refills | Status: DC

## 2021-10-16 NOTE — Progress Notes (Deleted)
I,Mykle Pascua,acting as a Education administrator for [employer], FNP.,have documented all relevant documentation on the behalf of [name], FNP,as directed by  [name], FNP while in the presence of [name], [place].  This visit occurred during the SARS-CoV-2 public health emergency.  Safety protocols were in place, including screening questions prior to the visit, additional usage of staff PPE, and extensive cleaning of exam room while observing appropriate contact time as indicated for disinfecting solutions.  Subjective:     Patient ID: Bridget Good , female    DOB: Dec 13, 1950 , 70 y.o.   MRN: [id number]   Chief Complaint  Patient presents with   Diabetes   Hyperlipidemia    HPI  The patient is here today for a diabetes, blood pressure, and cholesterol f/u.  The patient was also given her last lab results.   Diabetes She presents for her follow-up diabetic visit. She has type 2 diabetes mellitus. Her disease course has been improving. There are no hypoglycemic associated symptoms. Pertinent negatives for hypoglycemia include no confusion, dizziness, headaches or nervousness/anxiousness. There are no diabetic associated symptoms. Pertinent negatives for diabetes include no chest pain, no fatigue and no weakness. There are no hypoglycemic complications. Symptoms are stable. There are no diabetic complications. Risk factors for coronary artery disease include post-menopausal, hypertension, sedentary lifestyle and obesity. Current diabetic treatment includes oral agent (dual therapy). She is compliant with treatment most of the time. She has not had a previous visit with a dietitian. She rarely participates in exercise. (She has not been checking her blood sugar due to not knowing how to use the machine) She does not see a podiatrist.Eye exam is not current.  Hyperlipidemia She has no history of chronic renal disease. Pertinent negatives include no chest pain or shortness of breath.   Hypertension This is a chronic problem. The current episode started more than 1 year ago. The problem is unchanged. The problem is controlled. Pertinent negatives include no anxiety, chest pain, headaches, palpitations or shortness of breath. Risk factors for coronary artery disease include sedentary lifestyle, obesity and diabetes mellitus. There are no compliance problems.  There is no history of angina. There is no history of chronic renal disease.    Past Medical History:  Diagnosis Date   Angina    Anxiety    Bronchitis    Depression    Diabetes mellitus    Fibromyalgia    GERD (gastroesophageal reflux disease)    Headache(784.0)    Hyperlipidemia    Hypertension    Hypertensive heart disease without CHF    Lupus ([place])    "treated for it from 1992 til 2012; dr said I don't have it anymore"   Obesity (BMI 30-39.9)    Osteoarthritis    Pneumonia    Shortness of breath    lying down, upon exertion   Shortness of breath on exertion    Stroke ([place])    2019     Family History  Problem Relation Age of Onset   Heart attack Father    Diabetes Father    Hypertension Mother    Heart attack Brother    Kidney failure Brother    Diabetes Sister    Diabetes Sister      Current Outpatient Medications:    acetaminophen (TYLENOL) 500 MG tablet, Take 1 tablet (500 mg total) by mouth as needed for mild pain., Disp: 90 tablet, Rfl: 1   amLODipine-benazepril (LOTREL) 10-40 MG capsule, TAKE 1 CAPSULE BY MOUTH DAILY.(AM), Disp:  90 capsule, Rfl: 1   ASPIRIN LOW DOSE 81 MG EC tablet, TAKE ONE TABLET BY MOUTH ONCE DAILY (AM) (Patient taking differently: sometimes), Disp: 90 tablet, Rfl: 1   empagliflozin (JARDIANCE) 10 MG TABS tablet, Take 1 tablet (10 mg total) by mouth daily., Disp: 90 tablet, Rfl: 3   escitalopram (LEXAPRO) 5 MG tablet, Take 1 tablet (5 mg total) by mouth at bedtime., Disp: 90 tablet, Rfl: 1   glucose blood test strip, Test blood sugars up to 4 times a day, Disp: 100  each, Rfl: 12   Lancets (FREESTYLE) lancets, Use as instructed, Disp: 100 each, Rfl: 12   metoprolol succinate (TOPROL-XL) 50 MG 24 hr tablet, TAKE 1 TABLET BY MOUTH DAILY. TAKE WITH OR IMMEDIATELY FOLLOWING A MEAL (AM), Disp: 90 tablet, Rfl: 1   nitroGLYCERIN (NITROSTAT) 0.4 MG SL tablet, Place 1 tablet (0.4 mg total) under the tongue every 5 (five) minutes as needed for chest pain., Disp: 25 tablet, Rfl: 12   pantoprazole (PROTONIX) 40 MG tablet, TAKE ONE TABLET BY MOUTH DAILY IN THE MORNING., Disp: 90 tablet, Rfl: 1   potassium chloride SA (KLOR-CON) 20 MEQ tablet, TAKE ONE TABLET BY MOUTH ONCE DAILY (AM), Disp: 90 tablet, Rfl: 1   rosuvastatin (CRESTOR) 20 MG tablet, TAKE ONE TABLET BY MOUTH ONCE DAILY (BEDTIME), Disp: 90 tablet, Rfl: 1   Vitamin D, Ergocalciferol, (DRISDOL) 1.25 MG (50000 UNIT) CAPS capsule, TAKE 1 CAPSULE TWICE A WEEK ( MONDAY & THURSDAY), Disp: 24 capsule, Rfl: 1   blood glucose meter kit and supplies, Dispense based on patient and insurance preference. Use up to four times daily as directed. (FOR ICD-10 E10.9, E11.9)., Disp: 1 each, Rfl: 0   Blood Glucose Monitoring Suppl (FREESTYLE LITE) DEVI, Check blood sugars twice daily, Disp: 1 each, Rfl: 1   ciclopirox (PENLAC) 8 % solution, Apply topically at bedtime. Apply over nail and surrounding skin. Apply daily over previous coat. Remove weekly with polish remover. (Patient not taking: No sig reported), Disp: 6.6 mL, Rfl: 11   clopidogrel (PLAVIX) 75 MG tablet, TAKE ONE TABLET BY MOUTH ONCE DAILY (AM), Disp: 90 tablet, Rfl: 1   clotrimazole-betamethasone (LOTRISONE) cream, Apply to both feet and between toes bid x 4 weeks. (Patient not taking: No sig reported), Disp: 45 g, Rfl: 1   diclofenac Sodium (VOLTAREN) 1 % GEL, APPLY 2 GRAMS TOPICALLY 4 (FOUR) TIMES DAILY. (Patient not taking: Reported on 08/24/2021), Disp: 100 g, Rfl: 2   fluticasone (FLONASE) 50 MCG/ACT nasal spray, Place into both nostrils daily. (Patient not taking: No  sig reported), Disp: , Rfl:    Ginsengs-Royal Jelly-Vit B12 (GINSENG ROYAL JELLY PLUS) 375-1-15 MG-MG-MCG CAPS, Take 1 capsule by mouth daily. (Patient not taking: No sig reported), Disp: 90 capsule, Rfl: 1   Allergies  Allergen Reactions   Sulfa Antibiotics Hives, Itching and Nausea And Vomiting     Review of Systems  Constitutional: Negative.  Negative for fatigue.  Respiratory: Negative.  Negative for shortness of breath.   Cardiovascular: Negative.  Negative for chest pain and palpitations.  Gastrointestinal: Negative.   Neurological:  Negative for dizziness, weakness and headaches.  Psychiatric/Behavioral: Negative.  Negative for confusion. The patient is not nervous/anxious.   All other systems reviewed and are negative.   Today's Vitals   10/16/21 1422  BP: 114/70  Pulse: 89  Temp: 98.6 F (37 C)  Weight: 184 lb 3.2 oz (83.6 kg)  Height: 5' 3.2" (1.605 m)   Body mass index is 32.42 kg/m.  Wt Readings from Last 3 Encounters:  10/16/21 184 lb 3.2 oz (83.6 kg)  08/24/21 188 lb (85.3 kg)  06/27/21 188 lb 9.6 oz (85.5 kg)    BP Readings from Last 3 Encounters:  10/16/21 114/70  08/24/21 (!) 142/76  06/27/21 130/76  . Objective:  Physical Exam      Assessment And Plan:     1. Essential hypertension  2. Type 2 diabetes mellitus without complication, without long-term current use of insulin ([place])  3. Mixed hyperlipidemia  4. Class 1 obesity due to excess calories with body mass index (BMI) of 32.0 to 32.9 in adult, unspecified whether serious comorbidity present  5. Need for vaccination  She is encouraged to strive for BMI less than 30 to decrease cardiac risk. Advised to aim for at least 150 minutes of exercise per week.   Patient was given opportunity to ask questions. Patient verbalized understanding of the plan and was able to repeat key elements of the plan. All questions were answered to their satisfaction.  Michelle Nasuti, [place], Michelle Nasuti,  CMA, have reviewed all documentation for this visit. The documentation on 10/16/21 for the exam, diagnosis, procedures, and orders are all accurate and complete.   IF YOU HAVE BEEN REFERRED TO A SPECIALIST, IT MAY TAKE 1-2 WEEKS TO SCHEDULE/PROCESS THE REFERRAL. IF YOU HAVE NOT HEARD FROM US/SPECIALIST IN TWO WEEKS, PLEASE GIVE [place] A CALL AT [phone] X [phone].   THE PATIENT IS ENCOURAGED TO PRACTICE SOCIAL DISTANCING DUE TO THE COVID-19 PANDEMIC.

## 2021-10-16 NOTE — Patient Instructions (Addendum)
Mediterranean Diet A Mediterranean diet refers to food and lifestyle choices that are based on the traditions of countries located on the [employer]. It focuses on eating more fruits, vegetables, whole grains, beans, nuts, seeds, and heart-healthy fats, and eating less dairy, meat, eggs, and processed foods with added sugar, salt, and fat. This way of eating has been shown to help prevent certain conditions and improve outcomes for people who have chronic diseases, like kidney disease and heart disease. What are tips for following this plan? Reading food labels Check the serving size of packaged foods. For foods such as rice and pasta, the serving size refers to the amount of cooked product, not dry. Check the total fat in packaged foods. Avoid foods that have saturated fat or trans fats. Check the ingredient list for added sugars, such as corn syrup. Shopping  Buy a variety of foods that offer a balanced diet, including: Fresh fruits and vegetables (produce). Grains, beans, nuts, and seeds. Some of these may be available in unpackaged forms or large amounts (in bulk). Fresh seafood. Poultry and eggs. Low-fat dairy products. Buy whole ingredients instead of prepackaged foods. Buy fresh fruits and vegetables in-season from local farmers markets. Buy plain frozen fruits and vegetables. If you do not have access to quality fresh seafood, buy precooked frozen shrimp or canned fish, such as tuna, salmon, or sardines. Stock your pantry so you always have certain foods on hand, such as olive oil, canned tuna, canned tomatoes, rice, pasta, and beans. Cooking Cook foods with extra-virgin olive oil instead of using butter or other vegetable oils. Have meat as a side dish, and have vegetables or grains as your main dish. This means having meat in small portions or adding small amounts of meat to foods like pasta or stew. Use beans or vegetables instead of meat in common dishes like chili or  lasagna. Experiment with different cooking methods. Try roasting, broiling, steaming, and sauting vegetables. Add frozen vegetables to soups, stews, pasta, or rice. Add nuts or seeds for added healthy fats and plant protein at each meal. You can add these to yogurt, salads, or vegetable dishes. Marinate fish or vegetables using olive oil, lemon juice, garlic, and fresh herbs. Meal planning Plan to eat one vegetarian meal one day each week. Try to work up to two vegetarian meals, if possible. Eat seafood two or more times a week. Have healthy snacks readily available, such as: Vegetable sticks with hummus. Greek yogurt. Fruit and nut trail mix. Eat balanced meals throughout the week. This includes: Fruit: 2-3 servings a day. Vegetables: 4-5 servings a day. Low-fat dairy: 2 servings a day. Fish, poultry, or lean meat: 1 serving a day. Beans and legumes: 2 or more servings a week. Nuts and seeds: 1-2 servings a day. Whole grains: 6-8 servings a day. Extra-virgin olive oil: 3-4 servings a day. Limit red meat and sweets to only a few servings a month. Lifestyle  Cook and eat meals together with your family, when possible. Drink enough fluid to keep your urine pale yellow. Be physically active every day. This includes: Aerobic exercise like running or swimming. Leisure activities like gardening, walking, or housework. Get 7-8 hours of sleep each night. If recommended by your health care provider, drink red wine in moderation. This means 1 glass a day for nonpregnant women and 2 glasses a day for men. A glass of wine equals 5 oz (150 mL). What foods should I eat? Fruits Apples. Apricots. Avocado. Berries. Bananas. Cherries. Dates.  Figs. Grapes. Lemons. Melon. Oranges. Peaches. Plums. Pomegranate. Vegetables Artichokes. Beets. Broccoli. Cabbage. Carrots. Eggplant. Green beans. Chard. Kale. Spinach. Onions. Leeks. Peas. Squash. Tomatoes. Peppers. Radishes. Grains Whole-grain pasta. Brown  rice. Bulgur wheat. Polenta. Couscous. Whole-wheat bread. [place]. Meats and other proteins Beans. Almonds. Sunflower seeds. Pine nuts. Peanuts. [place]. Salmon. Scallops. Shrimp. [place]. Tilapia. Clams. Oysters. Eggs. Poultry without skin. Dairy Low-fat milk. Cheese. Greek yogurt. Fats and oils Extra-virgin olive oil. Avocado oil. Grapeseed oil. Beverages Water. Red wine. Herbal tea. Sweets and desserts Greek yogurt with honey. Baked apples. Poached pears. Trail mix. Seasonings and condiments Basil. Cilantro. Coriander. Cumin. Mint. Parsley. Sage. Rosemary. Tarragon. Garlic. Oregano. Thyme. Pepper. Balsamic vinegar. Tahini. Hummus. Tomato sauce. Olives. Mushrooms. The items listed above may not be a complete list of foods and beverages you can eat. Contact a dietitian for more information. What foods should I limit? This is a list of foods that should be eaten rarely or only on special occasions. Fruits Fruit canned in syrup. Vegetables Deep-fried potatoes (french fries). Grains Prepackaged pasta or rice dishes. Prepackaged cereal with added sugar. Prepackaged snacks with added sugar. Meats and other proteins Beef. Pork. Lamb. Poultry with skin. Hot dogs. [place] Salines. Dairy Ice cream. Sour cream. Whole milk. Fats and oils Butter. Canola oil. Vegetable oil. Beef fat (tallow). Lard. Beverages Juice. Sugar-sweetened soft drinks. Beer. Liquor and spirits. Sweets and desserts Cookies. Cakes. Pies. Candy. Seasonings and condiments Mayonnaise. Pre-made sauces and marinades. The items listed above may not be a complete list of foods and beverages you should limit. Contact a dietitian for more information. Summary The Mediterranean diet includes both food and lifestyle choices. Eat a variety of fresh fruits and vegetables, beans, nuts, seeds, and whole grains. Limit the amount of red meat and sweets that you eat. If recommended by your health care provider, drink red wine in moderation.  This means 1 glass a day for nonpregnant women and 2 glasses a day for men. A glass of wine equals 5 oz (150 mL). This information is not intended to replace advice given to you by your health care provider. Make sure you discuss any questions you have with your health care provider. Document Revised: 01/01/2020 Document Reviewed: 10/29/2019 Elsevier Patient Education  2022 [place] [email or web address] Call us At: [phone]

## 2021-10-16 NOTE — Progress Notes (Signed)
I,Katawbba Wiggins,acting as a Education administrator for [employer], FNP.,have documented all relevant documentation on the behalf of [name], FNP,as directed by  [name], FNP while in the presence of [name], [place].   This visit occurred during the SARS-CoV-2 public health emergency.  Safety protocols were in place, including screening questions prior to the visit, additional usage of staff PPE, and extensive cleaning of exam room while observing appropriate contact time as indicated for disinfecting solutions.  Subjective:     Patient ID: Bridget Good , female    DOB: 1951/03/16 , 70 y.o.   MRN: [id number]   Chief Complaint  Patient presents with   Diabetes   Hyperlipidemia    HPI  The patient is here today for a diabetes, blood pressure, and cholesterol f/u.  The patient was also given her last lab results.  She reports not being able to get to the eye doctor. She has missed several appts. She is having spots when she is looking at times, states "it was scary".   Wt Readings from Last 3 Encounters: 10/16/21 : 184 lb 3.2 oz (83.6 kg) 08/24/21 : 188 lb (85.3 kg) 06/27/21 : 188 lb 9.6 oz (85.5 kg)    Diabetes She presents for her follow-up diabetic visit. She has type 2 diabetes mellitus. Her disease course has been improving. There are no hypoglycemic associated symptoms. Pertinent negatives for hypoglycemia include no confusion, dizziness, headaches or nervousness/anxiousness. There are no diabetic associated symptoms. Pertinent negatives for diabetes include no chest pain, no fatigue and no weakness. There are no hypoglycemic complications. Symptoms are stable. There are no diabetic complications. Risk factors for coronary artery disease include post-menopausal, hypertension, sedentary lifestyle and obesity. Current diabetic treatment includes oral agent (dual therapy). She is compliant with treatment most of the time. She has not had a previous visit with a dietitian. She rarely  participates in exercise. (She has not been checking her blood sugar due to not knowing how to use the machine) She does not see a podiatrist.Eye exam is not current.  Hyperlipidemia She has no history of chronic renal disease. Pertinent negatives include no chest pain or shortness of breath.  Hypertension This is a chronic problem. The current episode started more than 1 year ago. The problem is unchanged. The problem is controlled. Pertinent negatives include no anxiety, chest pain, headaches, palpitations or shortness of breath. Risk factors for coronary artery disease include sedentary lifestyle, obesity and diabetes mellitus. There are no compliance problems.  There is no history of angina. There is no history of chronic renal disease.    Past Medical History:  Diagnosis Date   Angina    Anxiety    Bronchitis    Depression    Diabetes mellitus    Fibromyalgia    GERD (gastroesophageal reflux disease)    Headache(784.0)    Hyperlipidemia    Hypertension    Hypertensive heart disease without CHF    Lupus ([place])    "treated for it from 1992 til 2012; dr said I don't have it anymore"   Obesity (BMI 30-39.9)    Osteoarthritis    Pneumonia    Shortness of breath    lying down, upon exertion   Shortness of breath on exertion    Stroke [facility])    2019     Family History  Problem Relation Age of Onset   Heart attack Father    Diabetes Father    Hypertension Mother    Heart attack Brother  Kidney failure Brother    Diabetes Sister    Diabetes Sister      Current Outpatient Medications:    acetaminophen (TYLENOL) 500 MG tablet, Take 1 tablet (500 mg total) by mouth as needed for mild pain., Disp: 90 tablet, Rfl: 1   amLODipine-benazepril (LOTREL) 10-40 MG capsule, TAKE 1 CAPSULE BY MOUTH DAILY.(AM), Disp: 90 capsule, Rfl: 1   ASPIRIN LOW DOSE 81 MG EC tablet, TAKE ONE TABLET BY MOUTH ONCE DAILY (AM) (Patient taking differently: sometimes), Disp: 90 tablet, Rfl: 1    empagliflozin (JARDIANCE) 10 MG TABS tablet, Take 1 tablet (10 mg total) by mouth daily., Disp: 90 tablet, Rfl: 3   glucose blood test strip, Test blood sugars up to 4 times a day, Disp: 100 each, Rfl: 12   Lancets (FREESTYLE) lancets, Use as instructed, Disp: 100 each, Rfl: 12   metoprolol succinate (TOPROL-XL) 50 MG 24 hr tablet, TAKE 1 TABLET BY MOUTH DAILY. TAKE WITH OR IMMEDIATELY FOLLOWING A MEAL (AM), Disp: 90 tablet, Rfl: 1   nitroGLYCERIN (NITROSTAT) 0.4 MG SL tablet, Place 1 tablet (0.4 mg total) under the tongue every 5 (five) minutes as needed for chest pain., Disp: 25 tablet, Rfl: 12   pantoprazole (PROTONIX) 40 MG tablet, TAKE ONE TABLET BY MOUTH DAILY IN THE MORNING., Disp: 90 tablet, Rfl: 1   potassium chloride SA (KLOR-CON) 20 MEQ tablet, TAKE ONE TABLET BY MOUTH ONCE DAILY (AM), Disp: 90 tablet, Rfl: 1   rosuvastatin (CRESTOR) 20 MG tablet, TAKE ONE TABLET BY MOUTH ONCE DAILY (BEDTIME), Disp: 90 tablet, Rfl: 1   Semaglutide (RYBELSUS) 7 MG TABS, Take 1 tablet by mouth daily., Disp: 90 tablet, Rfl: 1   Vitamin D, Ergocalciferol, (DRISDOL) 1.25 MG (50000 UNIT) CAPS capsule, TAKE 1 CAPSULE TWICE A WEEK ( MONDAY & THURSDAY), Disp: 24 capsule, Rfl: 1   blood glucose meter kit and supplies, Dispense based on patient and insurance preference. Use up to four times daily as directed. (FOR ICD-10 E10.9, E11.9)., Disp: 1 each, Rfl: 0   Blood Glucose Monitoring Suppl (FREESTYLE LITE) DEVI, Check blood sugars twice daily, Disp: 1 each, Rfl: 1   ciclopirox (PENLAC) 8 % solution, Apply topically at bedtime. Apply over nail and surrounding skin. Apply daily over previous coat. Remove weekly with polish remover. (Patient not taking: No sig reported), Disp: 6.6 mL, Rfl: 11   clopidogrel (PLAVIX) 75 MG tablet, TAKE ONE TABLET BY MOUTH ONCE DAILY (AM), Disp: 90 tablet, Rfl: 1   clotrimazole-betamethasone (LOTRISONE) cream, Apply to both feet and between toes bid x 4 weeks. (Patient not taking: No sig  reported), Disp: 45 g, Rfl: 1   diclofenac Sodium (VOLTAREN) 1 % GEL, APPLY 2 GRAMS TOPICALLY 4 (FOUR) TIMES DAILY. (Patient not taking: Reported on 08/24/2021), Disp: 100 g, Rfl: 2   escitalopram (LEXAPRO) 5 MG tablet, TAKE ONE TABLET BY MOUTH DAILY AT BEDTIME., Disp: 90 tablet, Rfl: 1   fluticasone (FLONASE) 50 MCG/ACT nasal spray, Place into both nostrils daily. (Patient not taking: No sig reported), Disp: , Rfl:    Ginsengs-Royal Jelly-Vit B12 (GINSENG ROYAL JELLY PLUS) 375-1-15 MG-MG-MCG CAPS, Take 1 capsule by mouth daily. (Patient not taking: No sig reported), Disp: 90 capsule, Rfl: 1   Allergies  Allergen Reactions   Sulfa Antibiotics Hives, Itching and Nausea And Vomiting     Review of Systems  Constitutional: Negative.  Negative for fatigue.  Respiratory: Negative.  Negative for shortness of breath.   Cardiovascular: Negative.  Negative for chest pain and  palpitations.  Gastrointestinal: Negative.   Neurological:  Negative for dizziness, weakness and headaches.  Psychiatric/Behavioral: Negative.  Negative for confusion. The patient is not nervous/anxious.   All other systems reviewed and are negative.   Today's Vitals   10/16/21 1422  BP: 114/70  Pulse: 89  Temp: 98.6 F (37 C)  Weight: 184 lb 3.2 oz (83.6 kg)  Height: 5' 3.2" (1.605 m)   Body mass index is 32.42 kg/m.  Wt Readings from Last 3 Encounters:  10/16/21 184 lb 3.2 oz (83.6 kg)  08/24/21 188 lb (85.3 kg)  06/27/21 188 lb 9.6 oz (85.5 kg)    BP Readings from Last 3 Encounters:  10/16/21 114/70  08/24/21 (!) 142/76  06/27/21 130/76  . Objective:   Physical Exam Vitals reviewed.  Constitutional:      General: She is not in acute distress.    Appearance: Normal appearance. She is obese.  Cardiovascular:     Rate and Rhythm: Normal rate and regular rhythm.     Pulses: Normal pulses.     Heart sounds: Normal heart sounds. No murmur heard. Pulmonary:     Effort: Pulmonary effort is normal. No  respiratory distress.     Breath sounds: Normal breath sounds. No wheezing.  Skin:    General: Skin is warm and dry.     Capillary Refill: Capillary refill takes less than 2 seconds.  Neurological:     General: No focal deficit present.     Mental Status: She is alert and oriented to person, place, and time.     Cranial Nerves: No cranial nerve deficit.     Motor: No weakness.  Psychiatric:        Mood and Affect: Mood normal.        Behavior: Behavior normal.        Thought Content: Thought content normal.        Judgment: Judgment normal.       Assessment And Plan:     1. Essential hypertension Comments: Blood pressure is well controlled, continue with current medications  2. Type 2 diabetes mellitus with stage 3A Chronic kidney disease, without long-term current use of insulin ([place]) Comments: HgbA1c was slightly worsened at last visit, encouraged to take her medications as directed. Will start her on Rybelsus.  Long conversation about chronic kidney disease - Hemoglobin A1c - Semaglutide (RYBELSUS) 7 MG TABS; Take 1 tablet by mouth daily.  Dispense: 90 tablet; Refill: 1  3. Mixed hyperlipidemia Comments: Lipid panel levels slightly increased, she is to take her statin as directed, tolerating well . - BMP8+EGFR - Lipid panel  4. Class 1 obesity due to excess calories with body mass index (BMI) of 32.0 to 32.9 in adult, unspecified whether serious comorbidity present She is encouraged to strive for BMI less than 30 to decrease cardiac risk. Advised to aim for at least 150 minutes of exercise per week.       Patient was given opportunity to ask questions. Patient verbalized understanding of the plan and was able to repeat key elements of the plan. All questions were answered to their satisfaction.  [name], FNP    I, [name], FNP, have reviewed all documentation for this visit. The documentation on 10/16/21 for the exam, diagnosis, procedures, and orders are all  accurate and complete.  IF YOU HAVE BEEN REFERRED TO A SPECIALIST, IT MAY TAKE 1-2 WEEKS TO SCHEDULE/PROCESS THE REFERRAL. IF YOU HAVE NOT HEARD FROM US/SPECIALIST IN TWO WEEKS, PLEASE GIVE  [place] A CALL AT [phone] X [phone].   THE PATIENT IS ENCOURAGED TO PRACTICE SOCIAL DISTANCING DUE TO THE COVID-19 PANDEMIC.

## 2021-10-17 ENCOUNTER — Other Ambulatory Visit: Payer: Self-pay | Admitting: Nurse Practitioner

## 2021-10-17 LAB — HEMOGLOBIN A1C
Est. average glucose Bld gHb Est-mCnc: 183 mg/dL
Hgb A1c MFr Bld: 8 % — ABNORMAL HIGH (ref 4.8–5.6)

## 2021-10-17 LAB — BMP8+EGFR
BUN/Creatinine Ratio: 11 — ABNORMAL LOW (ref 12–28)
BUN: 14 mg/dL (ref 8–27)
CO2: 22 mmol/L (ref 20–29)
Calcium: 9.8 mg/dL (ref 8.7–10.3)
Chloride: 105 mmol/L (ref 96–106)
Creatinine, Ser: 1.3 mg/dL — ABNORMAL HIGH (ref 0.57–1.00)
Glucose: 152 mg/dL — ABNORMAL HIGH (ref 70–99)
Potassium: 3.8 mmol/L (ref 3.5–5.2)
Sodium: 140 mmol/L (ref 134–144)
eGFR: 45 mL/min/{1.73_m2} — ABNORMAL LOW (ref >59)

## 2021-10-23 LAB — MULTIPLE MYELOMA PROFILE

## 2021-10-23 LAB — LIPID PANEL
Chol/HDL Ratio: 2.5 ratio (ref 0.0–4.4)
Cholesterol, Total: 162 mg/dL (ref 100–199)
HDL: 66 mg/dL (ref >39)
LDL Chol Calc (NIH): 79 mg/dL (ref 0–99)
Triglycerides: 95 mg/dL (ref 0–149)
VLDL Cholesterol Cal: 17 mg/dL (ref 5–40)

## 2021-10-23 LAB — SPECIMEN STATUS REPORT

## 2021-10-25 ENCOUNTER — Other Ambulatory Visit: Payer: Self-pay | Admitting: Nurse Practitioner

## 2021-10-25 DIAGNOSIS — F32A Depression, unspecified: Secondary | ICD-10-CM

## 2021-10-25 DIAGNOSIS — F419 Anxiety disorder, unspecified: Secondary | ICD-10-CM

## 2021-11-13 ENCOUNTER — Telehealth: Payer: Self-pay

## 2021-11-13 NOTE — Chronic Care Management (AMB) (Signed)
    Chronic Care Management Pharmacy Assistant   Name: PAISLI SILFIES  MRN: [id number] DOB: Aug 07, 1951   Reason for Encounter: 2023 PAP   Medications: Outpatient Encounter Medications as of 11/13/2021  Medication Sig   acetaminophen (TYLENOL) 500 MG tablet Take 1 tablet (500 mg total) by mouth as needed for mild pain.   amLODipine-benazepril (LOTREL) 10-40 MG capsule TAKE 1 CAPSULE BY MOUTH DAILY.(AM)   ASPIRIN LOW DOSE 81 MG EC tablet TAKE ONE TABLET BY MOUTH ONCE DAILY (AM) (Patient taking differently: sometimes)   blood glucose meter kit and supplies Dispense based on patient and insurance preference. Use up to four times daily as directed. (FOR ICD-10 E10.9, E11.9).   Blood Glucose Monitoring Suppl (FREESTYLE LITE) DEVI Check blood sugars twice daily   ciclopirox (PENLAC) 8 % solution Apply topically at bedtime. Apply over nail and surrounding skin. Apply daily over previous coat. Remove weekly with polish remover. (Patient not taking: No sig reported)   clopidogrel (PLAVIX) 75 MG tablet TAKE ONE TABLET BY MOUTH ONCE DAILY (AM)   clotrimazole-betamethasone (LOTRISONE) cream Apply to both feet and between toes bid x 4 weeks. (Patient not taking: No sig reported)   diclofenac Sodium (VOLTAREN) 1 % GEL APPLY 2 GRAMS TOPICALLY 4 (FOUR) TIMES DAILY. (Patient not taking: Reported on 08/24/2021)   empagliflozin (JARDIANCE) 10 MG TABS tablet Take 1 tablet (10 mg total) by mouth daily.   escitalopram (LEXAPRO) 5 MG tablet TAKE ONE TABLET BY MOUTH DAILY AT BEDTIME.   fluticasone (FLONASE) 50 MCG/ACT nasal spray Place into both nostrils daily. (Patient not taking: No sig reported)   Ginsengs-Royal Jelly-Vit B12 (GINSENG ROYAL JELLY PLUS) 375-1-15 MG-MG-MCG CAPS Take 1 capsule by mouth daily. (Patient not taking: No sig reported)   glucose blood test strip Test blood sugars up to 4 times a day   Lancets (FREESTYLE) lancets Use as instructed   metoprolol succinate (TOPROL-XL) 50 MG 24 hr tablet  TAKE 1 TABLET BY MOUTH DAILY. TAKE WITH OR IMMEDIATELY FOLLOWING A MEAL (AM)   nitroGLYCERIN (NITROSTAT) 0.4 MG SL tablet Place 1 tablet (0.4 mg total) under the tongue every 5 (five) minutes as needed for chest pain.   pantoprazole (PROTONIX) 40 MG tablet TAKE ONE TABLET BY MOUTH DAILY IN THE MORNING.   potassium chloride SA (KLOR-CON) 20 MEQ tablet TAKE ONE TABLET BY MOUTH ONCE DAILY (AM)   rosuvastatin (CRESTOR) 20 MG tablet TAKE ONE TABLET BY MOUTH ONCE DAILY (BEDTIME)   Semaglutide (RYBELSUS) 7 MG TABS Take 1 tablet by mouth daily.   Vitamin D, Ergocalciferol, (DRISDOL) 1.25 MG (50000 UNIT) CAPS capsule TAKE 1 CAPSULE TWICE A WEEK ( MONDAY & THURSDAY)   No facility-administered encounter medications on file as of 11/13/2021.   11-13-2021: Initiated 2023 PAP for Jardiance and rybelsus. Spoke with patient's son and was informed to mail applications to his address [place] [address]. Informed patient's son patient needs to sign and return with income.  [place] Pharmacist Assistant [phone]

## 2021-11-29 ENCOUNTER — Other Ambulatory Visit: Payer: Self-pay | Admitting: Nurse Practitioner

## 2021-11-29 DIAGNOSIS — Z741 Need for assistance with personal care: Secondary | ICD-10-CM | POA: Diagnosis not present

## 2021-11-29 DIAGNOSIS — G3184 Mild cognitive impairment, so stated: Secondary | ICD-10-CM

## 2021-11-29 DIAGNOSIS — Z7902 Long term (current) use of antithrombotics/antiplatelets: Secondary | ICD-10-CM | POA: Diagnosis not present

## 2021-11-29 DIAGNOSIS — F32A Depression, unspecified: Secondary | ICD-10-CM | POA: Diagnosis not present

## 2021-11-29 DIAGNOSIS — Z7984 Long term (current) use of oral hypoglycemic drugs: Secondary | ICD-10-CM | POA: Diagnosis not present

## 2021-11-29 DIAGNOSIS — I10 Essential (primary) hypertension: Secondary | ICD-10-CM | POA: Diagnosis not present

## 2021-11-29 DIAGNOSIS — E785 Hyperlipidemia, unspecified: Secondary | ICD-10-CM | POA: Diagnosis not present

## 2021-11-29 DIAGNOSIS — R41841 Cognitive communication deficit: Secondary | ICD-10-CM | POA: Diagnosis not present

## 2021-11-29 DIAGNOSIS — E119 Type 2 diabetes mellitus without complications: Secondary | ICD-10-CM | POA: Diagnosis not present

## 2021-11-29 MED ORDER — DONEPEZIL HCL 5 MG PO TABS: 5.0000 mg | ORAL_TABLET | Freq: Every day | ORAL | 2 refills | Status: DC

## 2021-11-30 ENCOUNTER — Ambulatory Visit: Payer: Medicare (Managed Care) | Admitting: Nurse Practitioner

## 2021-12-06 ENCOUNTER — Other Ambulatory Visit: Payer: Self-pay

## 2021-12-06 MED ORDER — NITROGLYCERIN 0.4 MG SL SUBL: 0.4000 mg | SUBLINGUAL_TABLET | SUBLINGUAL | 12 refills | Status: AC | PRN

## 2021-12-08 DIAGNOSIS — Z0000 Encounter for general adult medical examination without abnormal findings: Secondary | ICD-10-CM | POA: Diagnosis not present

## 2021-12-12 DIAGNOSIS — G3184 Mild cognitive impairment, so stated: Secondary | ICD-10-CM | POA: Diagnosis not present

## 2021-12-12 DIAGNOSIS — E785 Hyperlipidemia, unspecified: Secondary | ICD-10-CM | POA: Diagnosis not present

## 2021-12-12 DIAGNOSIS — R41841 Cognitive communication deficit: Secondary | ICD-10-CM | POA: Diagnosis not present

## 2021-12-12 DIAGNOSIS — Z741 Need for assistance with personal care: Secondary | ICD-10-CM | POA: Diagnosis not present

## 2021-12-12 DIAGNOSIS — I10 Essential (primary) hypertension: Secondary | ICD-10-CM | POA: Diagnosis not present

## 2021-12-12 DIAGNOSIS — F32A Depression, unspecified: Secondary | ICD-10-CM | POA: Diagnosis not present

## 2021-12-12 DIAGNOSIS — E119 Type 2 diabetes mellitus without complications: Secondary | ICD-10-CM | POA: Diagnosis not present

## 2021-12-12 DIAGNOSIS — Z7902 Long term (current) use of antithrombotics/antiplatelets: Secondary | ICD-10-CM | POA: Diagnosis not present

## 2021-12-12 DIAGNOSIS — Z7984 Long term (current) use of oral hypoglycemic drugs: Secondary | ICD-10-CM | POA: Diagnosis not present

## 2021-12-19 ENCOUNTER — Other Ambulatory Visit: Payer: Self-pay

## 2021-12-19 ENCOUNTER — Ambulatory Visit (INDEPENDENT_AMBULATORY_CARE_PROVIDER_SITE_OTHER): Payer: Medicare (Managed Care) | Admitting: Nurse Practitioner

## 2021-12-19 ENCOUNTER — Encounter: Payer: Self-pay | Admitting: Nurse Practitioner

## 2021-12-19 VITALS — BP 130/78 | HR 63 | Temp 98.2°F | Ht 63.4 in | Wt 182.0 lb

## 2021-12-19 DIAGNOSIS — I10 Essential (primary) hypertension: Secondary | ICD-10-CM | POA: Diagnosis not present

## 2021-12-19 DIAGNOSIS — E1122 Type 2 diabetes mellitus with diabetic chronic kidney disease: Secondary | ICD-10-CM | POA: Diagnosis not present

## 2021-12-19 DIAGNOSIS — G3184 Mild cognitive impairment, so stated: Secondary | ICD-10-CM | POA: Diagnosis not present

## 2021-12-19 DIAGNOSIS — I129 Hypertensive chronic kidney disease with stage 1 through stage 4 chronic kidney disease, or unspecified chronic kidney disease: Secondary | ICD-10-CM | POA: Diagnosis not present

## 2021-12-19 DIAGNOSIS — N1831 Chronic kidney disease, stage 3a: Secondary | ICD-10-CM | POA: Diagnosis not present

## 2021-12-19 DIAGNOSIS — E782 Mixed hyperlipidemia: Secondary | ICD-10-CM

## 2021-12-19 DIAGNOSIS — L602 Onychogryphosis: Secondary | ICD-10-CM | POA: Diagnosis not present

## 2021-12-19 NOTE — Patient Instructions (Signed)

## 2021-12-19 NOTE — Progress Notes (Signed)
I,[name],acting as a Education administrator for [employer], NP.,have documented all relevant documentation on the behalf of [employer], NP,as directed by  Bary Castilla, NP while in the presence of Bary Castilla, NP.  This visit occurred during the SARS-CoV-2 public health emergency.  Safety protocols were in place, including screening questions prior to the visit, additional usage of staff PPE, and extensive cleaning of exam room while observing appropriate contact time as indicated for disinfecting solutions.  Subjective:     Patient ID: Bridget Good , female    DOB: [birth year] , 71 y.o.   MRN: [id number]   Chief Complaint  Patient presents with   Diabetes    HPI  Patient is here for DM follow up.  She is accompanied by her daughter and her husband.  Diet: she eats salad; she does not eat a lot of dressing. She eats meatball. She eats corn.   She does not check her BS on a normal bases.  She is complaint with her medications. They come in prefilled packages from the pharmacy.  She goes to the cardiologist. She does get home health care aides couple times a week.  She does not work. She stays home a lot.  She does not drive.  Wt Readings from Last 3 Encounters: 12/19/21 : 182 lb (82.6 kg) 10/16/21 : 184 lb 3.2 oz (83.6 kg) 08/24/21 : 188 lb (85.3 kg) She is doing well with the rybellsus.  Send blood work for her to Dr. Terrence Dupont. Fax [phone]  Diabetes She presents for her follow-up diabetic visit. She has type 2 diabetes mellitus. Her disease course has been improving. There are no hypoglycemic associated symptoms. Pertinent negatives for hypoglycemia include no confusion, dizziness, headaches or nervousness/anxiousness. Pertinent negatives for diabetes include no chest pain, no fatigue, no polydipsia, no polyphagia, no polyuria and no weakness. There are no hypoglycemic complications. Symptoms are stable. There are no diabetic complications. Risk factors for  coronary artery disease include post-menopausal, hypertension, sedentary lifestyle and obesity. Current diabetic treatment includes oral agent (dual therapy). She is compliant with treatment most of the time. She has not had a previous visit with a dietitian. She rarely participates in exercise. (She has not been checking her blood sugar due to not knowing how to use the machine) She does not see a podiatrist.Eye exam is not current.  Hypertension This is a chronic problem. The current episode started more than 1 year ago. The problem is unchanged. The problem is controlled. Pertinent negatives include no anxiety, chest pain, headaches, palpitations or shortness of breath. Risk factors for coronary artery disease include sedentary lifestyle, obesity and diabetes mellitus. There are no compliance problems.  There is no history of angina. There is no history of chronic renal disease.    Past Medical History:  Diagnosis Date   Angina    Anxiety    Bronchitis    Depression    Diabetes mellitus    Fibromyalgia    GERD (gastroesophageal reflux disease)    Headache(784.0)    Hyperlipidemia    Hypertension    Hypertensive heart disease without CHF    Lupus ([place])    "treated for it from 1992 til 2012; dr said I don't have it anymore"   Obesity (BMI 30-39.9)    Osteoarthritis    Pneumonia    Shortness of breath    lying down, upon exertion   Shortness of breath on exertion    Stroke ([place])    2019  Family History  Problem Relation Age of Onset   Heart attack Father    Diabetes Father    Hypertension Mother    Heart attack Brother    Kidney failure Brother    Diabetes Sister    Diabetes Sister      Current Outpatient Medications:    acetaminophen (TYLENOL) 500 MG tablet, Take 1 tablet (500 mg total) by mouth as needed for mild pain., Disp: 90 tablet, Rfl: 1   amLODipine-benazepril (LOTREL) 10-40 MG capsule, TAKE 1 CAPSULE BY MOUTH DAILY.(AM), Disp: 90 capsule, Rfl: 1   blood  glucose meter kit and supplies, Dispense based on patient and insurance preference. Use up to four times daily as directed. (FOR ICD-10 E10.9, E11.9)., Disp: 1 each, Rfl: 0   Blood Glucose Monitoring Suppl (FREESTYLE LITE) DEVI, Check blood sugars twice daily, Disp: 1 each, Rfl: 1   clopidogrel (PLAVIX) 75 MG tablet, TAKE ONE TABLET BY MOUTH ONCE DAILY (AM), Disp: 90 tablet, Rfl: 1   diclofenac Sodium (VOLTAREN) 1 % GEL, APPLY 2 GRAMS TOPICALLY 4 (FOUR) TIMES DAILY., Disp: 100 g, Rfl: 2   donepezil (ARICEPT) 5 MG tablet, Take 1 tablet (5 mg total) by mouth at bedtime., Disp: 30 tablet, Rfl: 2   empagliflozin (JARDIANCE) 10 MG TABS tablet, Take 1 tablet (10 mg total) by mouth daily., Disp: 90 tablet, Rfl: 3   escitalopram (LEXAPRO) 5 MG tablet, TAKE ONE TABLET BY MOUTH DAILY AT BEDTIME., Disp: 90 tablet, Rfl: 1   metoprolol succinate (TOPROL-XL) 50 MG 24 hr tablet, TAKE 1 TABLET BY MOUTH DAILY. TAKE WITH OR IMMEDIATELY FOLLOWING A MEAL (AM), Disp: 90 tablet, Rfl: 1   nitroGLYCERIN (NITROSTAT) 0.4 MG SL tablet, Place 1 tablet (0.4 mg total) under the tongue every 5 (five) minutes as needed for chest pain., Disp: 25 tablet, Rfl: 12   pantoprazole (PROTONIX) 40 MG tablet, TAKE ONE TABLET BY MOUTH DAILY IN THE MORNING., Disp: 90 tablet, Rfl: 1   potassium chloride SA (KLOR-CON M) 20 MEQ tablet, TAKE ONE TABLET BY MOUTH ONCE DAILY (AM), Disp: 90 tablet, Rfl: 1   rosuvastatin (CRESTOR) 20 MG tablet, TAKE ONE TABLET BY MOUTH ONCE DAILY (BEDTIME), Disp: 90 tablet, Rfl: 1   Semaglutide (RYBELSUS) 7 MG TABS, Take by mouth., Disp: , Rfl:    Vitamin D, Ergocalciferol, (DRISDOL) 1.25 MG (50000 UNIT) CAPS capsule, TAKE 1 CAPSULE TWICE A WEEK ( MONDAY & THURSDAY), Disp: 24 capsule, Rfl: 1   Aspirin 81 MG CAPS, , Disp: , Rfl:    glucose blood test strip, Test blood sugars up to 4 times a day, Disp: 100 each, Rfl: 12   Lancets (FREESTYLE) lancets, Use as instructed, Disp: 100 each, Rfl: 12   Allergies  Allergen  Reactions   Sulfa Antibiotics Hives, Itching and Nausea And Vomiting     Review of Systems  Constitutional: Negative.  Negative for fatigue.  HENT:  Negative for congestion.   Respiratory: Negative.  Negative for shortness of breath and wheezing.   Cardiovascular: Negative.  Negative for chest pain and palpitations.  Gastrointestinal: Negative.   Endocrine: Negative for polydipsia, polyphagia and polyuria.  Musculoskeletal:  Negative for myalgias.  Neurological: Negative.  Negative for dizziness, weakness and headaches.  Psychiatric/Behavioral:  Negative for confusion. The patient is not nervous/anxious.     Today's Vitals   12/19/21 1608  BP: 130/78  Pulse: 63  Temp: 98.2 F (36.8 C)  TempSrc: Oral  Weight: 182 lb (82.6 kg)  Height: 5' 3.4" (1.61 m)  Body mass index is 31.83 kg/m.   Objective:  Physical Exam Constitutional:      Appearance: Normal appearance. She is obese.  HENT:     Head: Normocephalic and atraumatic.  Cardiovascular:     Rate and Rhythm: Normal rate and regular rhythm.     Pulses: Normal pulses.     Heart sounds: Normal heart sounds. No murmur heard. Pulmonary:     Effort: Pulmonary effort is normal. No respiratory distress.     Breath sounds: Normal breath sounds. No wheezing.  Skin:    General: Skin is warm and dry.     Capillary Refill: Capillary refill takes less than 2 seconds.     Coloration: Skin is ashen.     Comments: Thickened toe nails on right foot   Neurological:     Mental Status: She is alert and oriented to person, place, and time.     Comments: Forgetful    Psychiatric:        Mood and Affect: Mood normal.        Behavior: Behavior normal.        Assessment And Plan:     1. Type 2 diabetes mellitus with stage 3a chronic kidney disease, without long-term current use of insulin ([facility]) -Continue meds -Chronic -She is doing well with the rybelsus.  -Advised patient her family to check BS at home and keep record to bring to  next visit.  - Hemoglobin A1c - Ambulatory referral to Podiatry  2. Essential hypertension -Chronic, continue meds  -Today BP was 130/78  -Advised patient about salt free diet. Avoid processed foods.  - BMP8+EGFR - CBC no Diff  3. Mild cognitive impairment -Continue donepezil once daily   4. Mixed hyperlipidemia -Chronic, continue meds  -Will check lipid panel today  --Educated patient about a diet that is low in fat and high fatty foods including dairy products. Increase in take of fish and fiber. Decrease intake of red meats and fast foods. Exercise for atleast 4-5 times a week or atleast 30-45 min. Drink a lot of water.   - Lipid panel  5. Thickened nails -On foot exam; right foot had thickened nails  - Ambulatory referral to Podiatry   The patient was encouraged to call or send a message through [place] for any questions or concerns.   Follow up: if symptoms persist or do not get better.   Side effects and appropriate use of all the medication(s) were discussed with the patient today. Patient advised to use the medication(s) as directed by their healthcare provider. The patient was encouraged to read, review, and understand all associated package inserts and contact our office with any questions or concerns. The patient accepts the risks of the treatment plan and had an opportunity to ask questions.   Patient was given opportunity to ask questions. Patient verbalized understanding of the plan and was able to repeat key elements of the plan. All questions were answered to their satisfaction.  Raman Lamyra Malcolm, DNP   I, Raman Kaven [place] have reviewed all documentation for this visit. The documentation on 12/19/20 for the exam, diagnosis, procedures, and orders are all accurate and complete.    IF YOU HAVE BEEN REFERRED TO A SPECIALIST, IT MAY TAKE 1-2 WEEKS TO SCHEDULE/PROCESS THE REFERRAL. IF YOU HAVE NOT HEARD FROM US/SPECIALIST IN TWO WEEKS, PLEASE GIVE [place] A CALL AT [phone] X  [phone].   THE PATIENT IS ENCOURAGED TO PRACTICE SOCIAL DISTANCING DUE TO THE COVID-19 PANDEMIC.

## 2021-12-20 ENCOUNTER — Encounter: Payer: Self-pay | Admitting: Nurse Practitioner

## 2021-12-20 ENCOUNTER — Other Ambulatory Visit: Payer: Self-pay | Admitting: Nurse Practitioner

## 2021-12-20 DIAGNOSIS — E1122 Type 2 diabetes mellitus with diabetic chronic kidney disease: Secondary | ICD-10-CM

## 2021-12-20 LAB — BMP8+EGFR
BUN/Creatinine Ratio: 10 — ABNORMAL LOW (ref 12–28)
BUN: 16 mg/dL (ref 8–27)
CO2: 22 mmol/L (ref 20–29)
Calcium: 10.3 mg/dL (ref 8.7–10.3)
Chloride: 103 mmol/L (ref 96–106)
Creatinine, Ser: 1.62 mg/dL — ABNORMAL HIGH (ref 0.57–1.00)
Glucose: 168 mg/dL — ABNORMAL HIGH (ref 70–99)
Potassium: 3.5 mmol/L (ref 3.5–5.2)
Sodium: 141 mmol/L (ref 134–144)
eGFR: 34 mL/min/{1.73_m2} — ABNORMAL LOW (ref >59)

## 2021-12-20 LAB — CBC
Hematocrit: 40.5 % (ref 34.0–46.6)
Hemoglobin: 13.5 g/dL (ref 11.1–15.9)
MCH: 29.7 pg (ref 26.6–33.0)
MCHC: 33.3 g/dL (ref 31.5–35.7)
MCV: 89 fL (ref 79–97)
Platelets: 278 10*3/uL (ref 150–450)
RBC: 4.54 x10E6/uL (ref 3.77–5.28)
RDW: 13.4 % (ref 11.7–15.4)
WBC: 4.7 10*3/uL (ref 3.4–10.8)

## 2021-12-20 LAB — LIPID PANEL
Chol/HDL Ratio: 2.6 ratio (ref 0.0–4.4)
Cholesterol, Total: 160 mg/dL (ref 100–199)
HDL: 62 mg/dL (ref >39)
LDL Chol Calc (NIH): 79 mg/dL (ref 0–99)
Triglycerides: 103 mg/dL (ref 0–149)
VLDL Cholesterol Cal: 19 mg/dL (ref 5–40)

## 2021-12-20 LAB — HEMOGLOBIN A1C
Est. average glucose Bld gHb Est-mCnc: 160 mg/dL
Hgb A1c MFr Bld: 7.2 % — ABNORMAL HIGH (ref 4.8–5.6)

## 2021-12-27 ENCOUNTER — Encounter: Payer: Self-pay | Admitting: Podiatry

## 2021-12-27 ENCOUNTER — Other Ambulatory Visit: Payer: Self-pay

## 2021-12-27 ENCOUNTER — Ambulatory Visit (INDEPENDENT_AMBULATORY_CARE_PROVIDER_SITE_OTHER): Payer: Medicare (Managed Care) | Admitting: Podiatry

## 2021-12-27 DIAGNOSIS — M79675 Pain in left toe(s): Secondary | ICD-10-CM

## 2021-12-27 DIAGNOSIS — M79674 Pain in right toe(s): Secondary | ICD-10-CM

## 2021-12-27 DIAGNOSIS — B351 Tinea unguium: Secondary | ICD-10-CM | POA: Diagnosis not present

## 2021-12-27 DIAGNOSIS — E119 Type 2 diabetes mellitus without complications: Secondary | ICD-10-CM

## 2021-12-27 NOTE — Progress Notes (Signed)
°  Subjective:  Patient ID: Bridget Good, female    DOB: 1951/02/08,   MRN: [id number]  Chief Complaint  Patient presents with   Nail Problem    Pt is here for [facility] She is a diabetic . Last A1C 8 checked last week    71 y.o. female presents for concern of thickened elongated and painful nails that are difficult to trim. Requesting to have them trimmed today. Denies any burning or tingling in her feet. Patient is diabetic and last A1c was 8.  Denies any other pedal complaints. Denies n/v/f/c.   PCP: [name] FNP   Past Medical History:  Diagnosis Date   Angina    Anxiety    Bronchitis    Depression    Diabetes mellitus    Fibromyalgia    GERD (gastroesophageal reflux disease)    Headache(784.0)    Hyperlipidemia    Hypertension    Hypertensive heart disease without CHF    Lupus ([place])    "treated for it from 1992 til 2012; dr said I don't have it anymore"   Obesity (BMI 30-39.9)    Osteoarthritis    Pneumonia    Shortness of breath    lying down, upon exertion   Shortness of breath on exertion    Stroke ([place])    2019    Objective:  Physical Exam: Vascular: DP/PT pulses 2/4 bilateral. CFT <3 seconds. Normal hair growth on digits. No edema.  Skin. No lacerations or abrasions bilateral feet. Nails 1-5 are thickened discolored and elongated with subungual debris.  Musculoskeletal: MMT 5/5 bilateral lower extremities in DF, PF, Inversion and Eversion. Deceased ROM in DF of ankle joint.  Neurological: Sensation intact to light touch.   Assessment:   1. Type 2 diabetes mellitus without complication, without long-term current use of insulin (HCC)   2. Pain due to onychomycosis of toenails of both feet      Plan:  Patient was evaluated and treated and all questions answered. -Discussed and educated patient on diabetic foot care, especially with  regards to the vascular, neurological and musculoskeletal systems.  -Stressed the importance of good glycemic control and  the detriment of not  controlling glucose levels in relation to the foot. -Discussed supportive shoes at all times and checking feet regularly.  -Mechanically debrided all nails 1-5 bilateral using sterile nail nipper and filed with dremel without incident  -Answered all patient questions -Patient to return  in 3 months for at risk foot care -Patient advised to call the office if any problems or questions arise in the meantime.   Lorenda Peck, DPM

## 2022-01-11 DIAGNOSIS — Z741 Need for assistance with personal care: Secondary | ICD-10-CM | POA: Diagnosis not present

## 2022-01-11 DIAGNOSIS — E785 Hyperlipidemia, unspecified: Secondary | ICD-10-CM | POA: Diagnosis not present

## 2022-01-11 DIAGNOSIS — E119 Type 2 diabetes mellitus without complications: Secondary | ICD-10-CM | POA: Diagnosis not present

## 2022-01-11 DIAGNOSIS — Z7984 Long term (current) use of oral hypoglycemic drugs: Secondary | ICD-10-CM | POA: Diagnosis not present

## 2022-01-11 DIAGNOSIS — F32A Depression, unspecified: Secondary | ICD-10-CM | POA: Diagnosis not present

## 2022-01-11 DIAGNOSIS — Z7902 Long term (current) use of antithrombotics/antiplatelets: Secondary | ICD-10-CM | POA: Diagnosis not present

## 2022-01-11 DIAGNOSIS — Z7982 Long term (current) use of aspirin: Secondary | ICD-10-CM | POA: Diagnosis not present

## 2022-01-11 DIAGNOSIS — I10 Essential (primary) hypertension: Secondary | ICD-10-CM | POA: Diagnosis not present

## 2022-01-11 DIAGNOSIS — R41841 Cognitive communication deficit: Secondary | ICD-10-CM | POA: Diagnosis not present

## 2022-01-11 DIAGNOSIS — G3184 Mild cognitive impairment, so stated: Secondary | ICD-10-CM | POA: Diagnosis not present

## 2022-01-30 DIAGNOSIS — E785 Hyperlipidemia, unspecified: Secondary | ICD-10-CM | POA: Diagnosis not present

## 2022-01-30 DIAGNOSIS — E1122 Type 2 diabetes mellitus with diabetic chronic kidney disease: Secondary | ICD-10-CM | POA: Diagnosis not present

## 2022-01-30 DIAGNOSIS — R079 Chest pain, unspecified: Secondary | ICD-10-CM | POA: Diagnosis not present

## 2022-01-30 DIAGNOSIS — N1832 Chronic kidney disease, stage 3b: Secondary | ICD-10-CM | POA: Diagnosis not present

## 2022-01-30 DIAGNOSIS — I129 Hypertensive chronic kidney disease with stage 1 through stage 4 chronic kidney disease, or unspecified chronic kidney disease: Secondary | ICD-10-CM | POA: Diagnosis not present

## 2022-02-02 ENCOUNTER — Other Ambulatory Visit: Payer: Self-pay | Admitting: Nurse Practitioner

## 2022-02-02 DIAGNOSIS — G3184 Mild cognitive impairment, so stated: Secondary | ICD-10-CM

## 2022-02-07 ENCOUNTER — Other Ambulatory Visit: Payer: Self-pay

## 2022-02-07 DIAGNOSIS — Z7982 Long term (current) use of aspirin: Secondary | ICD-10-CM | POA: Diagnosis not present

## 2022-02-07 DIAGNOSIS — G3184 Mild cognitive impairment, so stated: Secondary | ICD-10-CM | POA: Diagnosis not present

## 2022-02-07 DIAGNOSIS — Z741 Need for assistance with personal care: Secondary | ICD-10-CM | POA: Diagnosis not present

## 2022-02-07 DIAGNOSIS — F32A Depression, unspecified: Secondary | ICD-10-CM | POA: Diagnosis not present

## 2022-02-07 DIAGNOSIS — E119 Type 2 diabetes mellitus without complications: Secondary | ICD-10-CM

## 2022-02-07 DIAGNOSIS — I10 Essential (primary) hypertension: Secondary | ICD-10-CM | POA: Diagnosis not present

## 2022-02-07 DIAGNOSIS — Z7902 Long term (current) use of antithrombotics/antiplatelets: Secondary | ICD-10-CM | POA: Diagnosis not present

## 2022-02-07 DIAGNOSIS — R41841 Cognitive communication deficit: Secondary | ICD-10-CM | POA: Diagnosis not present

## 2022-02-07 DIAGNOSIS — Z7984 Long term (current) use of oral hypoglycemic drugs: Secondary | ICD-10-CM | POA: Diagnosis not present

## 2022-02-07 DIAGNOSIS — E785 Hyperlipidemia, unspecified: Secondary | ICD-10-CM | POA: Diagnosis not present

## 2022-02-07 MED ORDER — GLUCOSE BLOOD VI STRP: ORAL_STRIP | 12 refills | Status: DC

## 2022-02-07 MED ORDER — FREESTYLE LANCETS MISC: 12 refills | Status: DC

## 2022-02-07 MED ORDER — FREESTYLE LITE DEVI: 1 refills | Status: DC

## 2022-02-19 ENCOUNTER — Telehealth: Payer: Self-pay

## 2022-02-19 NOTE — Chronic Care Management (AMB) (Unsigned)
Chronic Care Management Pharmacy Assistant   Name: Bridget Good  MRN: [id number] DOB: 12-01-1951  Reason for Encounter: Disease State/ General  Recent office visits:  12-19-2021 Bridget Castilla, NP. Glucose= 168, Creatinine= 1.62, eGFR= 34, BUN/creatinine= 10. A1C= 7.2. Referral placed to podiatry. STOP Penlac, lotrisone, flonase nasal spray and ginseng.  10-16-2021 Bridget Good, Bridget Good. START rybelsus 7 mg daily. Glucose= 152, Creatinine= 1.30, eGFR= 45, BUN/Creatinine= 11.  08-24-2021 Bridget Simmering, LPN. Bridget Good annual wellness visit. Tdap and prevnar ordered. Abnormal UA.   Recent consult visits:  12-27-2021 Bridget Peck, MD (Podiatry). Mechanically debrided all nails 1-5 bilateral using sterile nail nipper and filed with dremel without incident. Follow up in 3 months.   Hospital visits:  None in previous 6 months  Medications: Outpatient Encounter Medications as of 02/19/2022  Medication Sig   acetaminophen (TYLENOL) 500 MG tablet Take 1 tablet (500 mg total) by mouth as needed for mild pain.   amLODipine-benazepril (LOTREL) 10-40 MG capsule TAKE 1 CAPSULE BY MOUTH DAILY.(AM)   Aspirin 81 MG CAPS    blood glucose meter kit and supplies Dispense based on patient and insurance preference. Use up to four times daily as directed. (FOR ICD-10 E10.9, E11.9).   Blood Glucose Monitoring Suppl (FREESTYLE LITE) DEVI Use as instructed to check blood sugar 4 times a day. Dx code e11.65   clopidogrel (PLAVIX) 75 MG tablet TAKE ONE TABLET BY MOUTH ONCE DAILY (AM)   diclofenac Sodium (VOLTAREN) 1 % GEL APPLY 2 GRAMS TOPICALLY 4 (FOUR) TIMES DAILY.   donepezil (ARICEPT) 5 MG tablet TAKE 1 TABLET (5 MG TOTAL) BY MOUTH AT BEDTIME.   empagliflozin (JARDIANCE) 10 MG TABS tablet Take 1 tablet (10 mg total) by mouth daily.   escitalopram (LEXAPRO) 5 MG tablet TAKE ONE TABLET BY MOUTH DAILY AT BEDTIME.   glucose blood test strip Use as instructed to check blood sugar 4 times a day. Dx  code e11.65   Lancets (FREESTYLE) lancets Use as instructed to check blood sugar 4 times a day. Dx code e11.65   metoprolol succinate (TOPROL-XL) 50 MG 24 hr tablet TAKE 1 TABLET BY MOUTH DAILY. TAKE WITH OR IMMEDIATELY FOLLOWING A MEAL (AM)   nitroGLYCERIN (NITROSTAT) 0.4 MG SL tablet Place 1 tablet (0.4 mg total) under the tongue every 5 (five) minutes as needed for chest pain.   pantoprazole (PROTONIX) 40 MG tablet TAKE ONE TABLET BY MOUTH DAILY IN THE MORNING.   potassium chloride SA (KLOR-CON M) 20 MEQ tablet TAKE ONE TABLET BY MOUTH ONCE DAILY (AM)   rosuvastatin (CRESTOR) 20 MG tablet TAKE ONE TABLET BY MOUTH ONCE DAILY (BEDTIME)   Semaglutide (RYBELSUS) 7 MG TABS Take by mouth.   Vitamin D, Ergocalciferol, (DRISDOL) 1.25 MG (50000 UNIT) CAPS capsule TAKE 1 CAPSULE TWICE A WEEK ( MONDAY & THURSDAY)   No facility-administered encounter medications on file as of 02/19/2022.   Contacted [name] for general disease state and medication adherence call.   Patient {is/isnt:25531} > 5 days past due for refill on the following medications per chart history:  What concerns do you have about your medications?  The patient {denies/reports:25180} side effects with her medications.   How often do you forget or accidentally miss a dose? {missed doses:25554}  Do you use a pillbox? {yes/no:20286}  Are you having any problems getting your medications from your pharmacy? {yes/no:20286}  Has the cost of your medications been a concern? {yes/no:20286} If yes, what medication and is patient assistance available or  has it been applied for?  Since last visit with CPP, {no/thefollowing:25210} interventions have been made:   The patient has not had an ED visit since last contact.   The patient {denies/reports:25180} problems with their health.   she {denies/reports:25180}  concerns or questions for [name], at this time.   Patient states BP and BG readings are as  follows***  Fasting: *** Before meals: *** After meals: *** Bedtime: ***  Counseled patient on: ***only leave if you discussed with patient   [place] job taking medications! (If good adherence)***  Importance of taking medication daily without missed doses  Benefits of adherence packaging or a pillbox   Access to CCM team for any cost, medication, or pharmacy concerns  02-19-2022: 1st attempt left VM  Care Gaps:  Star Rating Drugs: Lotrel 10-40 mg- Last filled Rosuvastatin 20 mg- Last filled Rybelsus 7 mg- Last filled  [facility] Pharmacist Assistant [phone]

## 2022-03-13 DIAGNOSIS — F32A Depression, unspecified: Secondary | ICD-10-CM | POA: Diagnosis not present

## 2022-03-13 DIAGNOSIS — E785 Hyperlipidemia, unspecified: Secondary | ICD-10-CM | POA: Diagnosis not present

## 2022-03-13 DIAGNOSIS — Z741 Need for assistance with personal care: Secondary | ICD-10-CM | POA: Diagnosis not present

## 2022-03-13 DIAGNOSIS — Z7982 Long term (current) use of aspirin: Secondary | ICD-10-CM | POA: Diagnosis not present

## 2022-03-13 DIAGNOSIS — R41841 Cognitive communication deficit: Secondary | ICD-10-CM | POA: Diagnosis not present

## 2022-03-13 DIAGNOSIS — G3184 Mild cognitive impairment, so stated: Secondary | ICD-10-CM | POA: Diagnosis not present

## 2022-03-13 DIAGNOSIS — Z7984 Long term (current) use of oral hypoglycemic drugs: Secondary | ICD-10-CM | POA: Diagnosis not present

## 2022-03-13 DIAGNOSIS — Z7902 Long term (current) use of antithrombotics/antiplatelets: Secondary | ICD-10-CM | POA: Diagnosis not present

## 2022-03-13 DIAGNOSIS — E119 Type 2 diabetes mellitus without complications: Secondary | ICD-10-CM | POA: Diagnosis not present

## 2022-03-13 DIAGNOSIS — I10 Essential (primary) hypertension: Secondary | ICD-10-CM | POA: Diagnosis not present

## 2022-03-27 ENCOUNTER — Ambulatory Visit (INDEPENDENT_AMBULATORY_CARE_PROVIDER_SITE_OTHER): Payer: Medicare (Managed Care) | Admitting: Podiatry

## 2022-03-27 ENCOUNTER — Encounter: Payer: Self-pay | Admitting: Podiatry

## 2022-03-27 DIAGNOSIS — E119 Type 2 diabetes mellitus without complications: Secondary | ICD-10-CM

## 2022-03-27 DIAGNOSIS — M79674 Pain in right toe(s): Secondary | ICD-10-CM

## 2022-03-27 DIAGNOSIS — B351 Tinea unguium: Secondary | ICD-10-CM

## 2022-03-27 DIAGNOSIS — M79675 Pain in left toe(s): Secondary | ICD-10-CM

## 2022-03-27 NOTE — Progress Notes (Signed)
?  Subjective:  ?Patient ID: Bridget Good, female    DOB: 04/10/1951,   MRN: [id number] ? ?Chief Complaint  ?Patient presents with  ? Nail Problem  ?   ?Diabetic foot care  ? ? ?71 y.o. female presents for concern of thickened elongated and painful nails that are difficult to trim. Requesting to have them trimmed today. Denies any burning or tingling in her feet. Patient is diabetic and last A1c was 7.2 on 12/19/21.  Denies any other pedal complaints. Denies n/v/f/c.  ? ?PCP: [name] FNP  ? ?Past Medical History:  ?Diagnosis Date  ? Angina   ? Anxiety   ? Bronchitis   ? Depression   ? Diabetes mellitus   ? Fibromyalgia   ? GERD (gastroesophageal reflux disease)   ? Headache(784.0)   ? Hyperlipidemia   ? Hypertension   ? Hypertensive heart disease without CHF   ? Lupus ([place])   ? "treated for it from 1992 til 2012; dr said I don't have it anymore"  ? Obesity (BMI 30-39.9)   ? Osteoarthritis   ? Pneumonia   ? Shortness of breath   ? lying down, upon exertion  ? Shortness of breath on exertion   ? Stroke [facility])   ? 2019  ? ? ?Objective:  ?Physical Exam: ?Vascular: DP/PT pulses 2/4 bilateral. CFT <3 seconds. Normal hair growth on digits. No edema.  ?Skin. No lacerations or abrasions bilateral feet. Nails 1-5 are thickened discolored and elongated with subungual debris.  ?Musculoskeletal: MMT 5/5 bilateral lower extremities in DF, PF, Inversion and Eversion. Deceased ROM in DF of ankle joint.  ?Neurological: Sensation intact to light touch.  ? ?Assessment:  ? ?1. Type 2 diabetes mellitus without complication, without long-term current use of insulin ([place])   ?2. Pain due to onychomycosis of toenails of both feet   ? ? ? ? ?Plan:  ?Patient was evaluated and treated and all questions answered. ?-Discussed and educated patient on diabetic foot care, especially with  ?regards to the vascular, neurological and musculoskeletal systems.  ?-Stressed the importance of good glycemic control and the detriment of not   ?controlling glucose levels in relation to the foot. ?-Discussed supportive shoes at all times and checking feet regularly.  ?-Mechanically debrided all nails 1-5 bilateral using sterile nail nipper and filed with dremel without incident  ?-Answered all patient questions ?-Patient to return  in 3 months for at risk foot care ?-Patient advised to call the office if any problems or questions arise in the meantime. ? ? ?Lorenda Peck, DPM  ? ? ?

## 2022-04-09 DIAGNOSIS — F32A Depression, unspecified: Secondary | ICD-10-CM | POA: Diagnosis not present

## 2022-04-09 DIAGNOSIS — E785 Hyperlipidemia, unspecified: Secondary | ICD-10-CM | POA: Diagnosis not present

## 2022-04-09 DIAGNOSIS — R41841 Cognitive communication deficit: Secondary | ICD-10-CM | POA: Diagnosis not present

## 2022-04-09 DIAGNOSIS — Z7982 Long term (current) use of aspirin: Secondary | ICD-10-CM | POA: Diagnosis not present

## 2022-04-09 DIAGNOSIS — Z7984 Long term (current) use of oral hypoglycemic drugs: Secondary | ICD-10-CM | POA: Diagnosis not present

## 2022-04-09 DIAGNOSIS — Z7902 Long term (current) use of antithrombotics/antiplatelets: Secondary | ICD-10-CM | POA: Diagnosis not present

## 2022-04-09 DIAGNOSIS — G3184 Mild cognitive impairment, so stated: Secondary | ICD-10-CM | POA: Diagnosis not present

## 2022-04-09 DIAGNOSIS — Z741 Need for assistance with personal care: Secondary | ICD-10-CM | POA: Diagnosis not present

## 2022-04-09 DIAGNOSIS — I10 Essential (primary) hypertension: Secondary | ICD-10-CM | POA: Diagnosis not present

## 2022-04-09 DIAGNOSIS — E119 Type 2 diabetes mellitus without complications: Secondary | ICD-10-CM | POA: Diagnosis not present

## 2022-04-17 ENCOUNTER — Encounter: Payer: Medicare (Managed Care) | Admitting: Nurse Practitioner

## 2022-04-17 NOTE — Progress Notes (Deleted)
?This visit occurred during the SARS-CoV-2 public health emergency.  Safety protocols were in place, including screening questions prior to the visit, additional usage of staff PPE, and extensive cleaning of exam room while observing appropriate contact time as indicated for disinfecting solutions. ? ?Subjective:  ?  ? Patient ID: Bridget Good , female    DOB: 1951/02/11 , 71 y.o.   MRN: [id number] ? ? ?Chief Complaint  ?Patient presents with  ? Annual Exam  ? ? ?HPI ? ?Pt here for HM.  ?  ? ?Past Medical History:  ?Diagnosis Date  ? Angina   ? Anxiety   ? Bronchitis   ? Depression   ? Diabetes mellitus   ? Fibromyalgia   ? GERD (gastroesophageal reflux disease)   ? Headache(784.0)   ? Hyperlipidemia   ? Hypertension   ? Hypertensive heart disease without CHF   ? Lupus ([place])   ? "treated for it from 1992 til 2012; dr said I don't have it anymore"  ? Obesity (BMI 30-39.9)   ? Osteoarthritis   ? Pneumonia   ? Shortness of breath   ? lying down, upon exertion  ? Shortness of breath on exertion   ? Stroke [facility])   ? 2019  ?  ? ?Family History  ?Problem Relation Age of Onset  ? Heart attack Father   ? Diabetes Father   ? Hypertension Mother   ? Heart attack Brother   ? Kidney failure Brother   ? Diabetes Sister   ? Diabetes Sister   ? ? ? ?Current Outpatient Medications:  ?  acetaminophen (TYLENOL) 500 MG tablet, Take 1 tablet (500 mg total) by mouth as needed for mild pain., Disp: 90 tablet, Rfl: 1 ?  amLODipine-benazepril (LOTREL) 10-40 MG capsule, TAKE 1 CAPSULE BY MOUTH DAILY.(AM), Disp: 90 capsule, Rfl: 1 ?  Aspirin 81 MG CAPS, , Disp: , Rfl:  ?  blood glucose meter kit and supplies, Dispense based on patient and insurance preference. Use up to four times daily as directed. (FOR ICD-10 E10.9, E11.9)., Disp: 1 each, Rfl: 0 ?  Blood Glucose Monitoring Suppl (FREESTYLE LITE) DEVI, Use as instructed to check blood sugar 4 times a day. Dx code e11.65, Disp: 1 each, Rfl: 1 ?  clopidogrel (PLAVIX) 75 MG tablet, TAKE ONE  TABLET BY MOUTH ONCE DAILY (AM), Disp: 90 tablet, Rfl: 1 ?  diclofenac Sodium (VOLTAREN) 1 % GEL, APPLY 2 GRAMS TOPICALLY 4 (FOUR) TIMES DAILY., Disp: 100 g, Rfl: 2 ?  donepezil (ARICEPT) 5 MG tablet, TAKE 1 TABLET (5 MG TOTAL) BY MOUTH AT BEDTIME., Disp: 30 tablet, Rfl: 2 ?  empagliflozin (JARDIANCE) 10 MG TABS tablet, Take 1 tablet (10 mg total) by mouth daily., Disp: 90 tablet, Rfl: 3 ?  escitalopram (LEXAPRO) 5 MG tablet, TAKE ONE TABLET BY MOUTH DAILY AT BEDTIME., Disp: 90 tablet, Rfl: 1 ?  glucose blood test strip, Use as instructed to check blood sugar 4 times a day. Dx code e11.65, Disp: 100 each, Rfl: 12 ?  Lancets (FREESTYLE) lancets, Use as instructed to check blood sugar 4 times a day. Dx code e11.65, Disp: 100 each, Rfl: 12 ?  metoprolol succinate (TOPROL-XL) 50 MG 24 hr tablet, TAKE 1 TABLET BY MOUTH DAILY. TAKE WITH OR IMMEDIATELY FOLLOWING A MEAL (AM), Disp: 90 tablet, Rfl: 1 ?  nitroGLYCERIN (NITROSTAT) 0.4 MG SL tablet, Place 1 tablet (0.4 mg total) under the tongue every 5 (five) minutes as needed for chest pain., Disp: 25 tablet, Rfl:  12 ?  pantoprazole (PROTONIX) 40 MG tablet, TAKE ONE TABLET BY MOUTH DAILY IN THE MORNING., Disp: 90 tablet, Rfl: 1 ?  potassium chloride SA (KLOR-CON M) 20 MEQ tablet, TAKE ONE TABLET BY MOUTH ONCE DAILY (AM), Disp: 90 tablet, Rfl: 1 ?  rosuvastatin (CRESTOR) 20 MG tablet, TAKE ONE TABLET BY MOUTH ONCE DAILY (BEDTIME), Disp: 90 tablet, Rfl: 1 ?  Semaglutide (RYBELSUS) 7 MG TABS, Take by mouth., Disp: , Rfl:  ?  Vitamin D, Ergocalciferol, (DRISDOL) 1.25 MG (50000 UNIT) CAPS capsule, TAKE 1 CAPSULE TWICE A WEEK ( MONDAY & THURSDAY), Disp: 24 capsule, Rfl: 1  ? ?Allergies  ?Allergen Reactions  ? Sulfa Antibiotics Hives, Itching and Nausea And Vomiting  ?  ? ? ?The patient states she uses {contraceptive methods:5051} for birth control. Last LMP was No LMP recorded. Patient has had a hysterectomy.. {Dysmenorrhea-menorrhagia:21918}. Negative for: breast discharge, breast  lump(s), breast pain and breast self exam. Associated symptoms include abnormal vaginal bleeding. Pertinent negatives include abnormal bleeding (hematology), anxiety, decreased libido, depression, difficulty falling sleep, dyspareunia, history of infertility, nocturia, sexual dysfunction, sleep disturbances, urinary incontinence, urinary urgency, vaginal discharge and vaginal itching. Diet regular.The patient states her exercise level is   ? Marland Kitchen The patient's tobacco use is:  ?Social History  ? ?Tobacco Use  ?Smoking Status Never  ?Smokeless Tobacco Never  ?Marland Kitchen She has been exposed to passive smoke. The patient's alcohol use is:  ?Social History  ? ?Substance and Sexual Activity  ?Alcohol Use No  ? Alcohol/week: 0.0 standard drinks  ?Marland Kitchen Additional information: Last pap ***, next one scheduled for ***.   ? ?Review of Systems  ?Constitutional: Negative.   ?HENT: Negative.    ?Eyes: Negative.   ?Respiratory: Negative.    ?Cardiovascular: Negative.   ?Gastrointestinal: Negative.   ?Endocrine: Negative.   ?Genitourinary: Negative.   ?Musculoskeletal: Negative.   ?Skin: Negative.   ?Allergic/Immunologic: Negative.   ?Neurological: Negative.   ?Hematological: Negative.   ?Psychiatric/Behavioral: Negative.     ? ?There were no vitals filed for this visit. ?There is no height or weight on file to calculate BMI.  ? ?Objective:  ?Physical Exam  ? ?   ?Assessment And Plan:  ?   ?1. Encounter for general medical examination ? ?2. Type 2 diabetes mellitus with stage 3b chronic kidney disease, without long-term current use of insulin ([place]) ? ?3. Essential hypertension ? ?4. Mixed hyperlipidemia ? ?5. Class 1 obesity due to excess calories with body mass index (BMI) of 32.0 to 32.9 in adult, unspecified whether serious comorbidity present ? ? ? ? ?Patient was given opportunity to ask questions. Patient verbalized understanding of the plan and was able to repeat key elements of the plan. All questions were answered to their satisfaction.   ? ?Debbora Dus, CMA  ? ?I, Debbora Dus, CMA, have reviewed all documentation for this visit. The documentation on 04/17/22 for the exam, diagnosis, procedures, and orders are all accurate and complete.  ?THE PATIENT IS ENCOURAGED TO PRACTICE SOCIAL DISTANCING DUE TO THE COVID-19 PANDEMIC.   ?

## 2022-04-23 NOTE — Progress Notes (Signed)
No show

## 2022-04-30 ENCOUNTER — Other Ambulatory Visit: Payer: Self-pay | Admitting: Nurse Practitioner

## 2022-04-30 DIAGNOSIS — I129 Hypertensive chronic kidney disease with stage 1 through stage 4 chronic kidney disease, or unspecified chronic kidney disease: Secondary | ICD-10-CM | POA: Diagnosis not present

## 2022-04-30 DIAGNOSIS — R079 Chest pain, unspecified: Secondary | ICD-10-CM | POA: Diagnosis not present

## 2022-04-30 DIAGNOSIS — N1832 Chronic kidney disease, stage 3b: Secondary | ICD-10-CM | POA: Diagnosis not present

## 2022-04-30 DIAGNOSIS — F0390 Unspecified dementia without behavioral disturbance: Secondary | ICD-10-CM | POA: Diagnosis not present

## 2022-04-30 DIAGNOSIS — E785 Hyperlipidemia, unspecified: Secondary | ICD-10-CM | POA: Diagnosis not present

## 2022-04-30 DIAGNOSIS — E1122 Type 2 diabetes mellitus with diabetic chronic kidney disease: Secondary | ICD-10-CM | POA: Diagnosis not present

## 2022-05-04 ENCOUNTER — Telehealth (HOSPITAL_BASED_OUTPATIENT_CLINIC_OR_DEPARTMENT_OTHER): Payer: Self-pay

## 2022-05-04 NOTE — Telephone Encounter (Signed)
error 

## 2022-05-08 ENCOUNTER — Other Ambulatory Visit (INDEPENDENT_AMBULATORY_CARE_PROVIDER_SITE_OTHER): Payer: Medicare (Managed Care) | Admitting: Nurse Practitioner

## 2022-05-08 DIAGNOSIS — Z741 Need for assistance with personal care: Secondary | ICD-10-CM

## 2022-05-08 DIAGNOSIS — G3184 Mild cognitive impairment, so stated: Secondary | ICD-10-CM

## 2022-05-08 DIAGNOSIS — Z7984 Long term (current) use of oral hypoglycemic drugs: Secondary | ICD-10-CM

## 2022-05-08 DIAGNOSIS — E782 Mixed hyperlipidemia: Secondary | ICD-10-CM

## 2022-05-08 DIAGNOSIS — Z7902 Long term (current) use of antithrombotics/antiplatelets: Secondary | ICD-10-CM

## 2022-05-08 DIAGNOSIS — I10 Essential (primary) hypertension: Secondary | ICD-10-CM | POA: Diagnosis not present

## 2022-05-08 DIAGNOSIS — E119 Type 2 diabetes mellitus without complications: Secondary | ICD-10-CM

## 2022-05-08 DIAGNOSIS — R41841 Cognitive communication deficit: Secondary | ICD-10-CM | POA: Diagnosis not present

## 2022-05-08 DIAGNOSIS — F330 Major depressive disorder, recurrent, mild: Secondary | ICD-10-CM

## 2022-05-08 DIAGNOSIS — Z7982 Long term (current) use of aspirin: Secondary | ICD-10-CM

## 2022-05-08 NOTE — Progress Notes (Signed)
Received home health orders orders from [facility]. Start of care 11/10/2021.   Certification and orders from 03/29/2022 through 05/27/2022 are reviewed, signed and faxed back to home health company.  Need of intermittent skilled services at home: Uncontrolled Type 2 diabetes  The home health care plan has been established by me and will be reviewed and updated as needed to maximize patient recovery.  I certify that all home health services have been and will be furnished to the patient while under my care.  Face-to-face encounter in which the need for home health services was established: See office visit note from 06/27/21.   Patient is receiving home health services for the following diagnoses: Problem List Items Addressed This Visit       Endocrine   Type 2 diabetes mellitus without complication, without long-term current use of insulin ([place]) - Primary (Chronic)     Other   Hyperlipidemia (Chronic)   Depression   Other Visit Diagnoses     Mild cognitive impairment with memory loss       Cognitive communication deficit       Benign essential hypertension       Long term current use of oral hypoglycemic drug       Encounter for long-term (current) use of antiplatelets/antithrombotics       Personal care impairment       Encounter for long-term (current) use of aspirin            [name], FNP

## 2022-05-10 DIAGNOSIS — Z7902 Long term (current) use of antithrombotics/antiplatelets: Secondary | ICD-10-CM | POA: Diagnosis not present

## 2022-05-10 DIAGNOSIS — E119 Type 2 diabetes mellitus without complications: Secondary | ICD-10-CM | POA: Diagnosis not present

## 2022-05-10 DIAGNOSIS — Z741 Need for assistance with personal care: Secondary | ICD-10-CM | POA: Diagnosis not present

## 2022-05-10 DIAGNOSIS — Z7982 Long term (current) use of aspirin: Secondary | ICD-10-CM | POA: Diagnosis not present

## 2022-05-10 DIAGNOSIS — I10 Essential (primary) hypertension: Secondary | ICD-10-CM | POA: Diagnosis not present

## 2022-05-10 DIAGNOSIS — F32A Depression, unspecified: Secondary | ICD-10-CM | POA: Diagnosis not present

## 2022-05-10 DIAGNOSIS — R41841 Cognitive communication deficit: Secondary | ICD-10-CM | POA: Diagnosis not present

## 2022-05-10 DIAGNOSIS — Z7984 Long term (current) use of oral hypoglycemic drugs: Secondary | ICD-10-CM | POA: Diagnosis not present

## 2022-05-10 DIAGNOSIS — E785 Hyperlipidemia, unspecified: Secondary | ICD-10-CM | POA: Diagnosis not present

## 2022-05-10 DIAGNOSIS — G3184 Mild cognitive impairment, so stated: Secondary | ICD-10-CM | POA: Diagnosis not present

## 2022-06-01 ENCOUNTER — Other Ambulatory Visit: Payer: Self-pay | Admitting: Nurse Practitioner

## 2022-06-01 DIAGNOSIS — E559 Vitamin D deficiency, unspecified: Secondary | ICD-10-CM

## 2022-06-01 DIAGNOSIS — E119 Type 2 diabetes mellitus without complications: Secondary | ICD-10-CM

## 2022-06-01 DIAGNOSIS — G3184 Mild cognitive impairment, so stated: Secondary | ICD-10-CM

## 2022-06-05 ENCOUNTER — Encounter: Payer: Self-pay | Admitting: Nurse Practitioner

## 2022-06-05 ENCOUNTER — Ambulatory Visit (INDEPENDENT_AMBULATORY_CARE_PROVIDER_SITE_OTHER): Payer: Medicare (Managed Care) | Admitting: Nurse Practitioner

## 2022-06-05 VITALS — BP 104/60 | HR 55 | Temp 98.1°F | Ht 63.0 in | Wt 166.6 lb

## 2022-06-05 DIAGNOSIS — G3184 Mild cognitive impairment, so stated: Secondary | ICD-10-CM

## 2022-06-05 DIAGNOSIS — E782 Mixed hyperlipidemia: Secondary | ICD-10-CM

## 2022-06-05 DIAGNOSIS — R41841 Cognitive communication deficit: Secondary | ICD-10-CM

## 2022-06-05 DIAGNOSIS — F330 Major depressive disorder, recurrent, mild: Secondary | ICD-10-CM | POA: Diagnosis not present

## 2022-06-05 DIAGNOSIS — I10 Essential (primary) hypertension: Secondary | ICD-10-CM | POA: Diagnosis not present

## 2022-06-05 DIAGNOSIS — E119 Type 2 diabetes mellitus without complications: Secondary | ICD-10-CM | POA: Diagnosis not present

## 2022-06-05 DIAGNOSIS — F419 Anxiety disorder, unspecified: Secondary | ICD-10-CM | POA: Diagnosis not present

## 2022-06-05 DIAGNOSIS — Z23 Encounter for immunization: Secondary | ICD-10-CM

## 2022-06-05 DIAGNOSIS — Z79899 Other long term (current) drug therapy: Secondary | ICD-10-CM

## 2022-06-05 DIAGNOSIS — Z0000 Encounter for general adult medical examination without abnormal findings: Secondary | ICD-10-CM

## 2022-06-05 DIAGNOSIS — E2839 Other primary ovarian failure: Secondary | ICD-10-CM | POA: Diagnosis not present

## 2022-06-05 DIAGNOSIS — E1169 Type 2 diabetes mellitus with other specified complication: Secondary | ICD-10-CM

## 2022-06-05 DIAGNOSIS — M25562 Pain in left knee: Secondary | ICD-10-CM

## 2022-06-05 DIAGNOSIS — Z1231 Encounter for screening mammogram for malignant neoplasm of breast: Secondary | ICD-10-CM

## 2022-06-05 DIAGNOSIS — F32A Depression, unspecified: Secondary | ICD-10-CM

## 2022-06-05 DIAGNOSIS — G8929 Other chronic pain: Secondary | ICD-10-CM

## 2022-06-05 MED ORDER — ESCITALOPRAM OXALATE 10 MG PO TABS: 10.0000 mg | ORAL_TABLET | Freq: Every day | ORAL | 1 refills | Status: DC

## 2022-06-05 NOTE — Progress Notes (Signed)
I,Victoria T Hamilton,acting as a Education administrator for [name], FNP.,have documented all relevant documentation on the behalf of [name], FNP,as directed by  [name], FNP while in the presence of [name], [place].   This visit occurred during the SARS-CoV-2 public health emergency.  Safety protocols were in place, including screening questions prior to the visit, additional usage of staff PPE, and extensive cleaning of exam room while observing appropriate contact time as indicated for disinfecting solutions.  Subjective:     Patient ID: Bridget Good , female    DOB: 03-May-1951 , 71 y.o.   MRN: [id number]   Chief Complaint  Patient presents with   Annual Exam    HPI  Pt presents today for HM.   Wt Readings from Last 3 Encounters: 06/05/22 : 166 lb 9.6 oz (75.6 kg) 12/19/21 : 182 lb (82.6 kg) 10/16/21 : 184 lb 3.2 oz (83.6 kg)  Next Cardiology appt is in July. She has her Nitro that she uses it occasionally.  Her son has the glucometer but has not had the education from the home health nurse - they kept missing each other.  She had been having PT who told them about a locked box from [place] they are trying to get the wifi one.  She continues to live with her husband and her children will check on her and her husband. She may or may not be taking aricept due to forgetting.    Past Medical History:  Diagnosis Date   Angina    Anxiety    Bronchitis    Depression    Diabetes mellitus    Fibromyalgia    GERD (gastroesophageal reflux disease)    Headache(784.0)    Hyperlipidemia    Hypertension    Hypertensive heart disease without CHF    Lupus ([place])    "treated for it from 1992 til 2012; dr said I don't have it anymore"   Obesity (BMI 30-39.9)    Osteoarthritis    Pneumonia    Shortness of breath    lying down, upon exertion   Shortness of breath on exertion    Stroke ([place])    2019     Family History  Problem Relation Age of Onset   Heart attack Father     Diabetes Father    Hypertension Mother    Heart attack Brother    Kidney failure Brother    Diabetes Sister    Diabetes Sister      Current Outpatient Medications:    acetaminophen (TYLENOL) 500 MG tablet, Take 1 tablet (500 mg total) by mouth as needed for mild pain., Disp: 90 tablet, Rfl: 1   donepezil (ARICEPT) 5 MG tablet, TAKE 1 TABLET (5 MG TOTAL) BY MOUTH AT BEDTIME., Disp: 30 tablet, Rfl: 2   escitalopram (LEXAPRO) 10 MG tablet, Take 1 tablet (10 mg total) by mouth daily., Disp: 90 tablet, Rfl: 1   JARDIANCE 10 MG TABS tablet, TAKE 1 TABLET (10 MG TOTAL) BY MOUTH DAILY. (AM), Disp: 90 tablet, Rfl: 3   nitroGLYCERIN (NITROSTAT) 0.4 MG SL tablet, Place 1 tablet (0.4 mg total) under the tongue every 5 (five) minutes as needed for chest pain., Disp: 25 tablet, Rfl: 12   rosuvastatin (CRESTOR) 20 MG tablet, TAKE ONE TABLET BY MOUTH ONCE DAILY (BEDTIME), Disp: 90 tablet, Rfl: 1   Vitamin D, Ergocalciferol, (DRISDOL) 1.25 MG (50000 UNIT) CAPS capsule, TAKE 1 CAPSULE TWICE A WEEK ( MONDAY & THURSDAY), Disp: 24 capsule, Rfl: 1  amLODipine-benazepril (LOTREL) 10-40 MG capsule, TAKE 1 CAPSULE BY MOUTH DAILY.(AM), Disp: 90 capsule, Rfl: 1   Aspirin 81 MG CAPS, , Disp: , Rfl:    ASPIRIN LOW DOSE 81 MG tablet, TAKE ONE TABLET BY MOUTH ONCE DAILY (AM), Disp: 90 tablet, Rfl: 1   Blood Glucose Monitoring Suppl (FREESTYLE LITE) w/Device KIT, USE AS INSTRUCTED (Patient not taking: Reported on 06/05/2022), Disp: 1 kit, Rfl: 1   clopidogrel (PLAVIX) 75 MG tablet, TAKE ONE TABLET BY MOUTH ONCE DAILY (AM), Disp: 90 tablet, Rfl: 1   diclofenac Sodium (VOLTAREN) 1 % GEL, APPLY 2 GRAMS TOPICALLY 4 (FOUR) TIMES DAILY. (Patient not taking: Reported on 06/05/2022), Disp: 100 g, Rfl: 2   glucose blood test strip, Use as instructed to check blood sugar 4 times a day. Dx code e11.65 (Patient not taking: Reported on 06/05/2022), Disp: 100 each, Rfl: 12   Lancets (FREESTYLE) lancets, Use as instructed to check blood  sugar 4 times a day. Dx code e11.65 (Patient not taking: Reported on 06/05/2022), Disp: 100 each, Rfl: 12   metoprolol succinate (TOPROL-XL) 50 MG 24 hr tablet, TAKE 1 TABLET BY MOUTH DAILY. TAKE WITH OR IMMEDIATELY FOLLOWING A MEAL (AM), Disp: 90 tablet, Rfl: 1   pantoprazole (PROTONIX) 40 MG tablet, TAKE ONE TABLET BY MOUTH DAILY IN THE MORNING., Disp: 90 tablet, Rfl: 1   potassium chloride SA (KLOR-CON M) 20 MEQ tablet, TAKE ONE TABLET BY MOUTH ONCE DAILY (AM), Disp: 90 tablet, Rfl: 1   Semaglutide (RYBELSUS) 3 MG TABS, Take 1 tablet by mouth daily. Take 30 minutes before breakfast, Disp: 90 tablet, Rfl: 1   Allergies  Allergen Reactions   Sulfa Antibiotics Hives, Itching and Nausea And Vomiting      The patient states she is status post hysterectomy for birth control.  No LMP recorded. Patient has had a hysterectomy.. Negative for Dysmenorrhea and Negative for Menorrhagia. Negative for: breast discharge, breast lump(s), breast pain and breast self exam. Associated symptoms include abnormal vaginal bleeding. Pertinent negatives include abnormal bleeding (hematology), anxiety, decreased libido, depression, difficulty falling sleep, dyspareunia, history of infertility, nocturia, sexual dysfunction, sleep disturbances, urinary incontinence, urinary urgency, vaginal discharge and vaginal itching. Diet regular; she will get meals from her son.  She does not have a big appetite; she reports she does not want to gain weight.  The patient states her exercise level is none.  She is complaining of bilateral knee pain, she has been taking breaks when walking.  She verbalizes she is afraid to go out and walk due to "bad things" happening.   The patient's tobacco use is:  Social History   Tobacco Use  Smoking Status Never  Smokeless Tobacco Never   She has been exposed to passive smoke. The patient's alcohol use is:  Social History   Substance and Sexual Activity  Alcohol Use No   Alcohol/week: 0.0  standard drinks of alcohol     Review of Systems  Constitutional: Negative.   Respiratory: Negative.    Cardiovascular: Negative.   Gastrointestinal: Negative.   Musculoskeletal:  Positive for back pain.       Bilateral knee pain - she had been using a pain cream  Neurological: Negative.   Psychiatric/Behavioral: Negative.       Today's Vitals   06/05/22 1418  BP: 104/60  Pulse: (!) 55  Temp: 98.1 F (36.7 C)  Weight: 166 lb 9.6 oz (75.6 kg)  Height: '5\' 3"'  (1.6 m)   Body mass index is 29.51 kg/m.  Objective:  Physical Exam Vitals reviewed.  Constitutional:      General: She is not in acute distress.    Appearance: Normal appearance. She is well-developed.  HENT:     Head: Normocephalic and atraumatic.     Right Ear: Hearing, tympanic membrane, ear canal and external ear normal. There is no impacted cerumen.     Left Ear: Hearing, tympanic membrane, ear canal and external ear normal. There is no impacted cerumen.     Nose: Nose normal.     Mouth/Throat:     Mouth: Mucous membranes are moist.  Eyes:     General: Lids are normal.     Extraocular Movements: Extraocular movements intact.     Conjunctiva/sclera: Conjunctivae normal.     Pupils: Pupils are equal, round, and reactive to light.     Funduscopic exam:    Right eye: No papilledema.        Left eye: No papilledema.  Neck:     Thyroid: No thyroid mass.     Vascular: No carotid bruit.  Cardiovascular:     Rate and Rhythm: Normal rate and regular rhythm.     Pulses: Normal pulses.     Heart sounds: Normal heart sounds. No murmur heard. Pulmonary:     Effort: Pulmonary effort is normal. No respiratory distress.     Breath sounds: Normal breath sounds. No wheezing.  Chest:     Chest wall: No mass.  Breasts:    Tanner Score is 5.     Right: Normal. No mass or tenderness.     Left: Normal. No mass or tenderness.  Abdominal:     General: Abdomen is flat. Bowel sounds are normal. There is no distension.      Palpations: Abdomen is soft.     Tenderness: There is no abdominal tenderness.  Musculoskeletal:        General: Tenderness (left knee) present. No swelling. Normal range of motion.     Cervical back: Full passive range of motion without pain, normal range of motion and neck supple.     Right lower leg: No edema.     Left lower leg: No edema.  Lymphadenopathy:     Upper Body:     Right upper body: No supraclavicular, axillary or pectoral adenopathy.     Left upper body: No supraclavicular, axillary or pectoral adenopathy.  Skin:    General: Skin is warm and dry.     Capillary Refill: Capillary refill takes less than 2 seconds.  Neurological:     General: No focal deficit present.     Mental Status: She is alert and oriented to person, place, and time.     Cranial Nerves: No cranial nerve deficit.     Sensory: No sensory deficit.     Motor: No weakness.  Psychiatric:        Mood and Affect: Mood normal.        Behavior: Behavior normal.        Thought Content: Thought content normal.        Judgment: Judgment normal.         Assessment And Plan:     1. Encounter for annual physical exam Behavior modifications discussed and diet history reviewed.   Pt will continue to exercise regularly and modify diet with low GI, plant based foods and decrease intake of processed foods.  Recommend intake of daily multivitamin, Vitamin D, and calcium.  Recommend mammogram and colonoscopy for preventive screenings, as well as  recommend immunizations that include influenza, TDAP, and Shingles  2. Encounter for screening mammogram for breast cancer Pt instructed on Self Breast Exam.According to ACOG guidelines Women aged 80 and older are recommended to get an annual mammogram. Form completed and given to patient contact the [facility] for appointment scheduing.  Pt encouraged to get annual mammogram - MM DIGITAL SCREENING BILATERAL; Future  3. Immunization due Will give tetanus vaccine  today while in office. Refer to order management. TDAP will be administered to adults 37-54 years old every 10 years. - Tdap vaccine greater than or equal to 7yo IM  4. Type 2 diabetes mellitus without complication, without long-term current use of insulin (HCC) Comments: Stable continue current medications. She does not check her blood sugar regularly - POCT Urinalysis Dipstick (81002) - EKG 12-Lead - Microalbumin / creatinine urine ratio - Hemoglobin A1c  5. Mild cognitive impairment with memory loss I do not feel she should be home alone sometimes has difficulty with remembering to take her medications. Daughter is planning to get a new pill box.  6. Cognitive communication deficit Comments: At times it is difficult for her to get her point across and needs her daughter to help   7. Benign essential hypertension Comments: Blood pressure is well controlled. Continue current medications - POCT Urinalysis Dipstick (81002) - EKG 12-Lead - Microalbumin / creatinine urine ratio - CMP14+EGFR  8. Mixed hyperlipidemia Comments: Continue statin, tolerating well.  - POCT Urinalysis Dipstick (81002) - EKG 12-Lead - Microalbumin / creatinine urine ratio - CMP14+EGFR - Lipid panel  9. Mild episode of recurrent major depressive disorder (HCC) - escitalopram (LEXAPRO) 10 MG tablet; Take 1 tablet (10 mg total) by mouth daily.  Dispense: 90 tablet; Refill: 1  10. Other long term (current) drug therapy - CBC  11. Decreased estrogen level - DG Bone Density; Future  12. Chronic pain of left knee - DG Knee Complete 4 Views Left; Future  13. Anxiety Comments: She admits to feeling anxious , she is currently taking escitalopram     Patient was given opportunity to ask questions. Patient verbalized understanding of the plan and was able to repeat key elements of the plan. All questions were answered to their satisfaction.   [name], FNP    I, [name], FNP, have reviewed all  documentation for this visit. The documentation on 06/05/22 for the exam, diagnosis, procedures, and orders are all accurate and complete.  THE PATIENT IS ENCOURAGED TO PRACTICE SOCIAL DISTANCING DUE TO THE COVID-19 PANDEMIC.

## 2022-06-06 ENCOUNTER — Other Ambulatory Visit: Payer: Self-pay | Admitting: Nurse Practitioner

## 2022-06-06 DIAGNOSIS — E1169 Type 2 diabetes mellitus with other specified complication: Secondary | ICD-10-CM

## 2022-06-06 LAB — LIPID PANEL
Chol/HDL Ratio: 2.6 ratio (ref 0.0–4.4)
Cholesterol, Total: 198 mg/dL (ref 100–199)
HDL: 76 mg/dL (ref >39)
LDL Chol Calc (NIH): 107 mg/dL — ABNORMAL HIGH (ref 0–99)
Triglycerides: 82 mg/dL (ref 0–149)
VLDL Cholesterol Cal: 15 mg/dL (ref 5–40)

## 2022-06-06 LAB — HEMOGLOBIN A1C
Est. average glucose Bld gHb Est-mCnc: 137 mg/dL
Hgb A1c MFr Bld: 6.4 % — ABNORMAL HIGH (ref 4.8–5.6)

## 2022-06-06 LAB — CMP14+EGFR
ALT: 10 IU/L (ref 0–32)
AST: 17 IU/L (ref 0–40)
Albumin/Globulin Ratio: 1.6 (ref 1.2–2.2)
Albumin: 4.4 g/dL (ref 3.8–4.8)
Alkaline Phosphatase: 65 IU/L (ref 44–121)
BUN/Creatinine Ratio: 13 (ref 12–28)
BUN: 18 mg/dL (ref 8–27)
Bilirubin Total: 1.2 mg/dL (ref 0.0–1.2)
CO2: 24 mmol/L (ref 20–29)
Calcium: 10.2 mg/dL (ref 8.7–10.3)
Chloride: 104 mmol/L (ref 96–106)
Creatinine, Ser: 1.35 mg/dL — ABNORMAL HIGH (ref 0.57–1.00)
Globulin, Total: 2.8 g/dL (ref 1.5–4.5)
Glucose: 67 mg/dL — ABNORMAL LOW (ref 70–99)
Potassium: 4.7 mmol/L (ref 3.5–5.2)
Sodium: 142 mmol/L (ref 134–144)
Total Protein: 7.2 g/dL (ref 6.0–8.5)
eGFR: 43 mL/min/{1.73_m2} — ABNORMAL LOW (ref >59)

## 2022-06-06 LAB — CBC
Hematocrit: 43 % (ref 34.0–46.6)
Hemoglobin: 13.7 g/dL (ref 11.1–15.9)
MCH: 29.5 pg (ref 26.6–33.0)
MCHC: 31.9 g/dL (ref 31.5–35.7)
MCV: 93 fL (ref 79–97)
Platelets: 287 10*3/uL (ref 150–450)
RBC: 4.65 x10E6/uL (ref 3.77–5.28)
RDW: 14.2 % (ref 11.7–15.4)
WBC: 5.3 10*3/uL (ref 3.4–10.8)

## 2022-06-06 LAB — MICROALBUMIN / CREATININE URINE RATIO
Creatinine, Urine: 153.1 mg/dL
Microalb/Creat Ratio: 13 mg/g creat (ref 0–29)
Microalbumin, Urine: 19.6 ug/mL

## 2022-06-06 MED ORDER — RYBELSUS 3 MG PO TABS: 1.0000 | ORAL_TABLET | Freq: Every day | ORAL | 1 refills | Status: AC

## 2022-06-07 ENCOUNTER — Other Ambulatory Visit (INDEPENDENT_AMBULATORY_CARE_PROVIDER_SITE_OTHER): Payer: Medicare (Managed Care) | Admitting: Nurse Practitioner

## 2022-06-07 DIAGNOSIS — E782 Mixed hyperlipidemia: Secondary | ICD-10-CM

## 2022-06-07 DIAGNOSIS — E1169 Type 2 diabetes mellitus with other specified complication: Secondary | ICD-10-CM

## 2022-06-07 DIAGNOSIS — G3184 Mild cognitive impairment, so stated: Secondary | ICD-10-CM

## 2022-06-07 DIAGNOSIS — E785 Hyperlipidemia, unspecified: Secondary | ICD-10-CM | POA: Diagnosis not present

## 2022-06-07 DIAGNOSIS — F331 Major depressive disorder, recurrent, moderate: Secondary | ICD-10-CM

## 2022-06-07 DIAGNOSIS — Z7984 Long term (current) use of oral hypoglycemic drugs: Secondary | ICD-10-CM | POA: Diagnosis not present

## 2022-06-07 DIAGNOSIS — R41841 Cognitive communication deficit: Secondary | ICD-10-CM | POA: Diagnosis not present

## 2022-06-07 DIAGNOSIS — Z7902 Long term (current) use of antithrombotics/antiplatelets: Secondary | ICD-10-CM | POA: Diagnosis not present

## 2022-06-07 DIAGNOSIS — I10 Essential (primary) hypertension: Secondary | ICD-10-CM

## 2022-06-07 NOTE — Progress Notes (Signed)
Chief Complaint  Patient presents with   [place] Re-certification     Received home health orders orders from [facility]. Start of care 11/29/2021.   Certification and orders from 05/28/2022 through 07/26/2022 are reviewed, signed and faxed back to home health company.  Need of intermittent skilled services at home: SN  The home health care plan has been established by me and will be reviewed and updated as needed to maximize patient recovery.  I certify that all home health services have been and will be furnished to the patient while under my care.  Face-to-face encounter in which the need for home health services was established: 06/27/2021  Patient is receiving home health services for the following diagnoses: Problem List Items Addressed This Visit       Endocrine   Dyslipidemia associated with type 2 diabetes mellitus ([place]) - Primary     Other   Mixed hyperlipidemia   Depression   Other Visit Diagnoses     Long term current use of oral hypoglycemic drug       Mild cognitive impairment with memory loss       Cognitive communication deficit       Benign essential hypertension       Encounter for long-term (current) use of antiplatelets/antithrombotics            [name], FNP

## 2022-06-08 ENCOUNTER — Telehealth: Payer: Self-pay

## 2022-06-08 NOTE — Chronic Care Management (AMB) (Signed)
06-08-2022: Rescheduled 06-26-2022 appointment with [name] to September due to provider being out. Will remind patient's daughter about appointment a few days prior.  [place] Pharmacist Assistant [phone]

## 2022-06-13 ENCOUNTER — Telehealth: Payer: Self-pay

## 2022-06-13 DIAGNOSIS — G3184 Mild cognitive impairment, so stated: Secondary | ICD-10-CM | POA: Diagnosis not present

## 2022-06-13 DIAGNOSIS — Z7902 Long term (current) use of antithrombotics/antiplatelets: Secondary | ICD-10-CM | POA: Diagnosis not present

## 2022-06-13 DIAGNOSIS — Z741 Need for assistance with personal care: Secondary | ICD-10-CM | POA: Diagnosis not present

## 2022-06-13 DIAGNOSIS — E119 Type 2 diabetes mellitus without complications: Secondary | ICD-10-CM | POA: Diagnosis not present

## 2022-06-13 DIAGNOSIS — Z7982 Long term (current) use of aspirin: Secondary | ICD-10-CM | POA: Diagnosis not present

## 2022-06-13 DIAGNOSIS — F32A Depression, unspecified: Secondary | ICD-10-CM | POA: Diagnosis not present

## 2022-06-13 DIAGNOSIS — I10 Essential (primary) hypertension: Secondary | ICD-10-CM | POA: Diagnosis not present

## 2022-06-13 DIAGNOSIS — Z7984 Long term (current) use of oral hypoglycemic drugs: Secondary | ICD-10-CM | POA: Diagnosis not present

## 2022-06-13 DIAGNOSIS — E785 Hyperlipidemia, unspecified: Secondary | ICD-10-CM | POA: Diagnosis not present

## 2022-06-13 NOTE — Chronic Care Management (AMB) (Signed)
Chronic Care Management Pharmacy Assistant   Name: Bridget Good  MRN: [id number] DOB: 06/04/1951  Reason for Encounter: Disease State/ Diabetes  Recent office visits:  06-07-2022 Bridget Good, [place]. Visit for home health re-certification.   06-05-2022 Bridget Good, [place]. Glucose= 67, Creatinine= 1.35, eGFR= 43. A1C= 6.1. LDL= 107. Orders placed for DG bone density, DG knee complete 4 views left and MM digital screening bilateral. EKG completed.  05-08-2022 Bridget Good, [place]. Visit for home health re-certification.   Recent consult visits:  03-27-2022 Bridget Peck, MD (podiatry). Mechanically debrided all nails 1-5 bilateral using sterile nail nipper and filed with dremel without incident   Hospital visits:  None in previous 6 months  Medications: Outpatient Encounter Medications as of 06/13/2022  Medication Sig   acetaminophen (TYLENOL) 500 MG tablet Take 1 tablet (500 mg total) by mouth as needed for mild pain.   amLODipine-benazepril (LOTREL) 10-40 MG capsule TAKE 1 CAPSULE BY MOUTH DAILY.(AM)   Aspirin 81 MG CAPS  (Patient not taking: Reported on 06/05/2022)   Blood Glucose Monitoring Suppl (FREESTYLE LITE) w/Device KIT USE AS INSTRUCTED (Patient not taking: Reported on 06/05/2022)   clopidogrel (PLAVIX) 75 MG tablet TAKE ONE TABLET BY MOUTH ONCE DAILY (AM)   diclofenac Sodium (VOLTAREN) 1 % GEL APPLY 2 GRAMS TOPICALLY 4 (FOUR) TIMES DAILY. (Patient not taking: Reported on 06/05/2022)   donepezil (ARICEPT) 5 MG tablet TAKE 1 TABLET (5 MG TOTAL) BY MOUTH AT BEDTIME.   escitalopram (LEXAPRO) 10 MG tablet Take 1 tablet (10 mg total) by mouth daily.   glucose blood test strip Use as instructed to check blood sugar 4 times a day. Dx code e11.65 (Patient not taking: Reported on 06/05/2022)   JARDIANCE 10 MG TABS tablet TAKE 1 TABLET (10 MG TOTAL) BY MOUTH DAILY. (AM)   Lancets (FREESTYLE) lancets Use as instructed to check blood sugar 4 times a day. Dx code e11.65 (Patient not  taking: Reported on 06/05/2022)   metoprolol succinate (TOPROL-XL) 50 MG 24 hr tablet TAKE 1 TABLET BY MOUTH DAILY. TAKE WITH OR IMMEDIATELY FOLLOWING A MEAL (AM)   nitroGLYCERIN (NITROSTAT) 0.4 MG SL tablet Place 1 tablet (0.4 mg total) under the tongue every 5 (five) minutes as needed for chest pain.   pantoprazole (PROTONIX) 40 MG tablet TAKE ONE TABLET BY MOUTH DAILY IN THE MORNING.   potassium chloride SA (KLOR-CON M) 20 MEQ tablet TAKE ONE TABLET BY MOUTH ONCE DAILY (AM)   rosuvastatin (CRESTOR) 20 MG tablet TAKE ONE TABLET BY MOUTH ONCE DAILY (BEDTIME)   Semaglutide (RYBELSUS) 3 MG TABS Take 1 tablet by mouth daily. Take 30 minutes before breakfast   Vitamin D, Ergocalciferol, (DRISDOL) 1.25 MG (50000 UNIT) CAPS capsule TAKE 1 CAPSULE TWICE A WEEK ( MONDAY & THURSDAY)   No facility-administered encounter medications on file as of 06/13/2022.  Recent Relevant Labs: Lab Results  Component Value Date/Time   HGBA1C 6.4 (H) 06/05/2022 03:30 PM   HGBA1C 7.2 (H) 12/19/2021 04:56 PM   MICROALBUR 80 08/24/2021 04:50 PM   MICROALBUR 150 01/11/2020 11:43 AM    Kidney Function Lab Results  Component Value Date/Time   CREATININE 1.35 (H) 06/05/2022 03:30 PM   CREATININE 1.62 (H) 12/19/2021 04:56 PM   GFRNONAA 39 (L) 01/11/2020 11:48 AM   GFRAA 45 (L) 01/11/2020 11:48 AM    06-13-2022: 1st attempt left VM 06-14-2022: 2nd attempt left son a VM 06-18-2022: 3rd attempt left VM  Care Gaps: Shingrix overdue PNA Vac overdue Covid booster overdue  AWV 09-20-2022  Star Rating Drugs: Lotrel 10-40 mg- Last filled 06-01-2022 [facility] Rosuvastatin 20 mg- Last filled 06-01-2022 [facility] Rybelsus 7 mg- Last filled 06-01-2022 [facility] Jardiance 10 mg- Last filled 06-01-2022 [place] Clinical Pharmacist Assistant [phone]

## 2022-06-19 DIAGNOSIS — Z7984 Long term (current) use of oral hypoglycemic drugs: Secondary | ICD-10-CM | POA: Diagnosis not present

## 2022-06-19 DIAGNOSIS — E785 Hyperlipidemia, unspecified: Secondary | ICD-10-CM | POA: Diagnosis not present

## 2022-06-19 DIAGNOSIS — Z741 Need for assistance with personal care: Secondary | ICD-10-CM | POA: Diagnosis not present

## 2022-06-19 DIAGNOSIS — Z7902 Long term (current) use of antithrombotics/antiplatelets: Secondary | ICD-10-CM | POA: Diagnosis not present

## 2022-06-19 DIAGNOSIS — F32A Depression, unspecified: Secondary | ICD-10-CM | POA: Diagnosis not present

## 2022-06-19 DIAGNOSIS — I10 Essential (primary) hypertension: Secondary | ICD-10-CM | POA: Diagnosis not present

## 2022-06-19 DIAGNOSIS — Z7982 Long term (current) use of aspirin: Secondary | ICD-10-CM | POA: Diagnosis not present

## 2022-06-19 DIAGNOSIS — E119 Type 2 diabetes mellitus without complications: Secondary | ICD-10-CM | POA: Diagnosis not present

## 2022-06-19 DIAGNOSIS — G3184 Mild cognitive impairment, so stated: Secondary | ICD-10-CM | POA: Diagnosis not present

## 2022-06-20 ENCOUNTER — Ambulatory Visit (HOSPITAL_COMMUNITY)
Admission: EM | Admit: 2022-06-20 | Discharge: 2022-06-20 | Disposition: A | Discharge status: Home / Self Care | Payer: Medicare Other | Attending: Urgent Care | Admitting: Urgent Care

## 2022-06-20 ENCOUNTER — Encounter (HOSPITAL_COMMUNITY): Payer: Self-pay

## 2022-06-20 DIAGNOSIS — B309 Viral conjunctivitis, unspecified: Secondary | ICD-10-CM | POA: Diagnosis not present

## 2022-06-20 DIAGNOSIS — H02843 Edema of right eye, unspecified eyelid: Secondary | ICD-10-CM | POA: Diagnosis not present

## 2022-06-20 MED ORDER — DEXAMETHASONE SODIUM PHOSPHATE 10 MG/ML IJ SOLN
INTRAMUSCULAR | Status: AC
  Filled 2022-06-20: qty 1

## 2022-06-20 MED ORDER — DEXAMETHASONE SODIUM PHOSPHATE 10 MG/ML IJ SOLN: 10.0000 mg | Freq: Once | INTRAMUSCULAR | Status: DC

## 2022-06-20 MED ORDER — DEXAMETHASONE SODIUM PHOSPHATE 10 MG/ML IJ SOLN
10.0000 mg | Freq: Once | INTRAMUSCULAR | Status: AC
  Administered 2022-06-20: 10 mg via INTRAMUSCULAR

## 2022-06-20 NOTE — ED Triage Notes (Signed)
Right eye swelling for less than an hour. Patient ate shrimp and fish at [employer].  Patient have slight irritation in the eye, felt like something was in it.

## 2022-06-20 NOTE — ED Provider Notes (Signed)
[facility]    CSN: [id number] Arrival date & time: 06/20/22  1716      History   Chief Complaint Chief Complaint  Patient presents with   Facial Swelling    Right eye    HPI Bridget Good is a 71 y.o. female.   Pleasant 71 year old female presents today with concerns of swelling of her eye and eyelid on the right starting an hour or 2 prior to presentation.  She states she was getting in the car after eating at a restaurant, and noticed some irritation.  As time went on, she felt like there were "rocks in her eye".  She states that has been tearing, but no additional discharge.  She had a headache earlier this morning, but that went away prior to the ocular symptoms.  She denies any additional URI symptoms, namely sore throat, sneezing, rhinorrhea, fever.  She also denies photophobia, blurred vision, pain with extraocular movements.  Due to the acuteness of symptoms, she has not tried any treatments.  Does have a history of diabetes, last A1c was 6.4% roughly 2 weeks ago.    Past Medical History:  Diagnosis Date   Angina    Anxiety    Bronchitis    Depression    Diabetes mellitus    Fibromyalgia    GERD (gastroesophageal reflux disease)    Headache(784.0)    Hyperlipidemia    Hypertension    Hypertensive heart disease without CHF    Lupus ([place])    "treated for it from 1992 til 2012; dr said I don't have it anymore"   Obesity (BMI 30-39.9)    Osteoarthritis    Pneumonia    Shortness of breath    lying down, upon exertion   Shortness of breath on exertion    Stroke ([place])    2019    Patient Active Problem List   Diagnosis Date Noted   Skull mass 02/27/2019   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   Anxiety about health 02/09/2019   Brain tumor ([place]) 02/04/2019   Arm bruise, left, initial encounter 01/07/2019   Anxiety 12/30/2018   Decreased estrogen level 12/23/2018   Abnormal blood chemistry 12/23/2018   Depression 12/23/2018   CNS mass  12/15/2018   Stroke ([place]) 12/15/2018   Hypertensive retinopathy 12/08/2018   Palpitations 07/16/2018   Cerebral thrombosis with cerebral infarction 07/12/2018   Chest pain 07/11/2018   Abnormal EKG 07/11/2018   AKI (acute kidney injury) ([place]) 07/11/2018   Essential hypertension    Retrognathia 07/10/2018   Psychophysiological insomnia 07/10/2018   Snoring 07/10/2018   History of lupus 01/02/2012   CAD (coronary artery disease)    Dyslipidemia associated with type 2 diabetes mellitus ([place])    Hypertensive heart disease without CHF    Mixed hyperlipidemia    Obesity (BMI 30-39.9)    GERD (gastroesophageal reflux disease)    Osteoarthritis     Past Surgical History:  Procedure Laterality Date   ABDOMINAL HYSTERECTOMY     partial   CARDIAC CATHETERIZATION  ~ 2007   CESAREAN SECTION  1978; 1981   COLONOSCOPY     CRANIOTOMY N/A 02/27/2019   Procedure: Endonasal Endoscopic biopsy of clival mass;  Surgeon: Judith Part, MD;  Location: [facility];  Service: Neurosurgery;  Laterality: N/A;  Endonasal Endoscopic biopsy of clival mass   ENDOSCOPIC TRANS NASAL APPROACH WITH FUSION N/A 02/27/2019   Procedure: ENDOSCOPIC TRANS NASAL APPROACH WITH FUSION;  Surgeon: Judith Part, MD;  [facility];  Service: Neurosurgery;  Laterality: N/A;  ENDOSCOPIC TRANS NASAL APPROACH WITH FUSION   FOOT SURGERY     "had to cut it to let the fluids out; it had swollen very badly; left foot"    OB History   No obstetric history on file.      Home Medications    Prior to Admission medications   Medication Sig Start Date End Date Taking? Authorizing Provider  acetaminophen (TYLENOL) 500 MG tablet Take 1 tablet (500 mg total) by mouth as needed for mild pain. 06/04/19  Yes [name], FNP  amLODipine-benazepril (LOTREL) 10-40 MG capsule TAKE 1 CAPSULE BY MOUTH DAILY.(AM) 11/29/21  Yes [name], FNP  donepezil (ARICEPT) 5 MG tablet TAKE 1 TABLET (5 MG TOTAL) BY MOUTH AT BEDTIME. 06/02/22  06/02/23 Yes [name], FNP  escitalopram (LEXAPRO) 10 MG tablet Take 1 tablet (10 mg total) by mouth daily. 06/05/22 06/05/23 Yes [name], FNP  JARDIANCE 10 MG TABS tablet TAKE 1 TABLET (10 MG TOTAL) BY MOUTH DAILY. (AM) 06/02/22  Yes [name], FNP  metoprolol succinate (TOPROL-XL) 50 MG 24 hr tablet TAKE 1 TABLET BY MOUTH DAILY. TAKE WITH OR IMMEDIATELY FOLLOWING A MEAL (AM) 11/29/21  Yes [name], FNP  pantoprazole (PROTONIX) 40 MG tablet TAKE ONE TABLET BY MOUTH DAILY IN THE MORNING. 11/29/21  Yes [name], FNP  potassium chloride SA (KLOR-CON M) 20 MEQ tablet TAKE ONE TABLET BY MOUTH ONCE DAILY (AM) 11/29/21  Yes [name], FNP  rosuvastatin (CRESTOR) 20 MG tablet TAKE ONE TABLET BY MOUTH ONCE DAILY (BEDTIME) 11/29/21  Yes [name], FNP  Semaglutide (RYBELSUS) 3 MG TABS Take 1 tablet by mouth daily. Take 30 minutes before breakfast 06/06/22  Yes [name], FNP  Vitamin D, Ergocalciferol, (DRISDOL) 1.25 MG (50000 UNIT) CAPS capsule TAKE 1 CAPSULE TWICE A WEEK ( [place]) 06/02/22  Yes [name], FNP  Aspirin 81 MG CAPS     [provider]  Blood Glucose Monitoring Suppl (FREESTYLE LITE) w/Device KIT USE AS INSTRUCTED Patient not taking: Reported on 06/05/2022 04/30/22   [name], FNP  clopidogrel (PLAVIX) 75 MG tablet TAKE ONE TABLET BY MOUTH ONCE DAILY (AM) 11/29/21   [name], FNP  diclofenac Sodium (VOLTAREN) 1 % GEL APPLY 2 GRAMS TOPICALLY 4 (FOUR) TIMES DAILY. Patient not taking: Reported on 06/05/2022 10/04/20   [name], FNP  glucose blood test strip Use as instructed to check blood sugar 4 times a day. Dx code e11.65 Patient not taking: Reported on 06/05/2022 02/07/22   [name], FNP  Lancets (FREESTYLE) lancets Use as instructed to check blood sugar 4 times a day. Dx code e11.65 Patient not taking: Reported on 06/05/2022 02/07/22   [name], FNP  nitroGLYCERIN (NITROSTAT) 0.4 MG SL tablet Place 1 tablet (0.4 mg  total) under the tongue every 5 (five) minutes as needed for chest pain. 12/06/21   [name], FNP    Family History Family History  Problem Relation Age of Onset   Heart attack Father    Diabetes Father    Hypertension Mother    Heart attack Brother    Kidney failure Brother    Diabetes Sister    Diabetes Sister     Social History Social History   Tobacco Use   Smoking status: Never   Smokeless tobacco: Never  Vaping Use   Vaping Use: Never used  Substance Use Topics   Alcohol use: No    Alcohol/week: 0.0 standard drinks  of alcohol   Drug use: No     Allergies   Sulfa antibiotics   Review of Systems Review of Systems  Eyes:  Positive for discharge (eyelid swelling).  All other systems reviewed and are negative.    Physical Exam Triage Vital Signs ED Triage Vitals  Enc Vitals Group     BP 06/20/22 1743 128/84     Pulse Rate 06/20/22 1743 (!) 59     Resp 06/20/22 1743 16     Temp 06/20/22 1743 98.5 F (36.9 C)     Temp Source 06/20/22 1743 Oral     SpO2 06/20/22 1743 95 %     Weight 06/20/22 1740 178 lb (80.7 kg)     Height 06/20/22 1740 '5\' 3"'  (1.6 m)     Head Circumference --      Peak Flow --      Pain Score 06/20/22 1740 4     Pain Loc --      Pain Edu? --      Excl. in [place]? --    No data found.  Updated Vital Signs BP 128/84 (BP Location: Left Arm)   Pulse (!) 59   Temp 98.5 F (36.9 C) (Oral)   Resp 16   Ht '5\' 3"'  (1.6 m)   Wt 178 lb (80.7 kg)   SpO2 95%   BMI 31.53 kg/m   Visual Acuity Right Eye Distance:   Left Eye Distance:   Bilateral Distance:    Right Eye Near:   Left Eye Near:    Bilateral Near:     Physical Exam Vitals and nursing note reviewed. Exam conducted with a chaperone present.  Constitutional:      General: She is not in acute distress.    Appearance: Normal appearance. She is normal weight. She is not ill-appearing, toxic-appearing or diaphoretic.  HENT:     Head: Normocephalic and atraumatic.     Right  Ear: External ear normal.     Left Ear: External ear normal.     Nose: No congestion or rhinorrhea.     Mouth/Throat:     Mouth: Mucous membranes are moist.     Pharynx: Oropharynx is clear. No oropharyngeal exudate or posterior oropharyngeal erythema.  Eyes:     General: Lids are everted, no foreign bodies appreciated. Vision grossly intact. Gaze aligned appropriately. No allergic shiner, visual field deficit or scleral icterus.       Right eye: Discharge (slight TEARING) present. No foreign body or hordeolum.        Left eye: No foreign body, discharge or hordeolum.     Extraocular Movements:     Right eye: Normal extraocular motion and no nystagmus.     Left eye: Normal extraocular motion and no nystagmus.     Conjunctiva/sclera:     Right eye: Right conjunctiva is injected (MINIMALLY). Chemosis (moderately severe) present. No exudate or hemorrhage.    Left eye: Left conjunctiva is not injected. No chemosis, exudate or hemorrhage.    Pupils: Pupils are equal, round, and reactive to light. Pupils are equal.     Right eye: Pupil is round, reactive and not sluggish.     Left eye: Pupil is round, reactive and not sluggish.     Visual Fields: Right eye visual fields normal and left eye visual fields normal.      Comments: R upper lid significantly edematous with positive transillumination. NO erythema or warmth. No discharge or crusting. No lid lesions  Pulmonary:  Effort: Pulmonary effort is normal. No respiratory distress.  Musculoskeletal:     Cervical back: Normal range of motion and neck supple. No rigidity or tenderness.  Lymphadenopathy:     Cervical: No cervical adenopathy.  Skin:    General: Skin is warm and dry.     Findings: No erythema or rash.  Neurological:     General: No focal deficit present.     Mental Status: She is alert and oriented to person, place, and time.      UC Treatments / Results  Labs (all labs ordered are listed, but only abnormal results are  displayed) Labs Reviewed - No data to display  EKG   Radiology No results found.  Procedures Procedures (including critical care time)  Medications Ordered in UC Medications  dexamethasone (DECADRON) injection 10 mg (10 mg Intramuscular Given 06/20/22 1822)    Initial Impression / Assessment and Plan / UC Course  I have reviewed the triage vital signs and the nursing notes.  Pertinent labs & imaging results that were available during my care of the patient were reviewed by me and considered in my medical decision making (see chart for details).     Viral conjunctivitis of R eye - discussed typical clinical course. Given the degree of chemosis, recommended pt follow up with ophthalmology in the morning. On call ophthalmology info given to patient to call in morning. Decadron given here to help with eyelid edema. Pt would likely do well with topical steroid eye drops, but will defer this decision to the specialist. AVOID touching eye, highly contagious. Eyelid edema - there is no clinical sign of periorbital cellulitis or blepharitis. Suspect secondary to viral infection vs possible allergic trigger from recent [place]. Decadron given in office, pt to take OTC antihistamines upon return home. Call specialist in morning. Head to ER if any worsening swelling, change in vision or pain behind eye.   Final Clinical Impressions(s) / UC Diagnoses   Final diagnoses:  Acute viral conjunctivitis of right eye  Eyelid edema, right     Discharge Instructions      You were given a shot of dexamethasone in the office to help with the eyelid swelling.  You may take 25 mg of Benadryl this evening to help as well. I suspect you have viral conjunctivitis, which can cause significant eye swelling.  This is usually due to a virus, adenovirus. It is highly contagious. If the swelling of your eye continues, please call Dr. Lucianne Lei (information attached to this form) as topical steroid drops are  sometimes needed, which should come from the specialist.      ED Prescriptions   None    PDMP not reviewed this encounter.   Chaney Malling, [place] 06/22/22 [phone]

## 2022-06-20 NOTE — Discharge Instructions (Addendum)
You were given a shot of dexamethasone in the office to help with the eyelid swelling.  You may take 25 mg of Benadryl this evening to help as well. I suspect you have viral conjunctivitis, which can cause significant eye swelling.  This is usually due to a virus, adenovirus. It is highly contagious. If the swelling of your eye continues, please call Dr. Lucianne Lei (information attached to this form) as topical steroid drops are sometimes needed, which should come from the specialist.

## 2022-06-26 ENCOUNTER — Telehealth: Payer: Medicare (Managed Care)

## 2022-07-03 ENCOUNTER — Other Ambulatory Visit: Payer: Self-pay | Admitting: Nurse Practitioner

## 2022-07-03 ENCOUNTER — Ambulatory Visit: Payer: Medicare (Managed Care) | Admitting: Podiatry

## 2022-07-03 DIAGNOSIS — R0789 Other chest pain: Secondary | ICD-10-CM | POA: Diagnosis not present

## 2022-07-03 DIAGNOSIS — E785 Hyperlipidemia, unspecified: Secondary | ICD-10-CM | POA: Diagnosis not present

## 2022-07-03 DIAGNOSIS — I208 Other forms of angina pectoris: Secondary | ICD-10-CM | POA: Diagnosis not present

## 2022-07-03 DIAGNOSIS — I10 Essential (primary) hypertension: Secondary | ICD-10-CM | POA: Diagnosis not present

## 2022-07-18 ENCOUNTER — Ambulatory Visit (INDEPENDENT_AMBULATORY_CARE_PROVIDER_SITE_OTHER): Payer: Medicare Other | Admitting: Podiatry

## 2022-07-18 ENCOUNTER — Encounter: Payer: Self-pay | Admitting: Podiatry

## 2022-07-18 DIAGNOSIS — M79674 Pain in right toe(s): Secondary | ICD-10-CM | POA: Diagnosis not present

## 2022-07-18 DIAGNOSIS — E119 Type 2 diabetes mellitus without complications: Secondary | ICD-10-CM

## 2022-07-18 DIAGNOSIS — B351 Tinea unguium: Secondary | ICD-10-CM | POA: Diagnosis not present

## 2022-07-18 DIAGNOSIS — M79675 Pain in left toe(s): Secondary | ICD-10-CM

## 2022-07-18 NOTE — Progress Notes (Signed)
  Subjective:  Patient ID: Bridget Good, female    DOB: 10/18/1951,   MRN: [id number]  Chief Complaint  Patient presents with   Nail Problem     Routine foot care     71 y.o. female presents for concern of thickened elongated and painful nails that are difficult to trim. Requesting to have them trimmed today. Denies any burning or tingling in her feet. Patient is diabetic and last A1c was 7.2 on 12/19/21.  Denies any other pedal complaints. Denies n/v/f/c.   PCP: [name] FNP   Past Medical History:  Diagnosis Date   Angina    Anxiety    Bronchitis    Depression    Diabetes mellitus    Fibromyalgia    GERD (gastroesophageal reflux disease)    Headache(784.0)    Hyperlipidemia    Hypertension    Hypertensive heart disease without CHF    Lupus ([place])    "treated for it from 1992 til 2012; dr said I don't have it anymore"   Obesity (BMI 30-39.9)    Osteoarthritis    Pneumonia    Shortness of breath    lying down, upon exertion   Shortness of breath on exertion    Stroke ([place])    2019    Objective:  Physical Exam: Vascular: DP/PT pulses 2/4 bilateral. CFT <3 seconds. Normal hair growth on digits. No edema.  Skin. No lacerations or abrasions bilateral feet. Nails 1-5 are thickened discolored and elongated with subungual debris.  Musculoskeletal: MMT 5/5 bilateral lower extremities in DF, PF, Inversion and Eversion. Deceased ROM in DF of ankle joint.  Neurological: Sensation intact to light touch.   Assessment:   1. Pain due to onychomycosis of toenails of both feet   2. Type 2 diabetes mellitus without complication, without long-term current use of insulin ([place])       Plan:  Patient was evaluated and treated and all questions answered. -Discussed and educated patient on diabetic foot care, especially with  regards to the vascular, neurological and musculoskeletal systems.  -Stressed the importance of good glycemic control and the detriment of not   controlling glucose levels in relation to the foot. -Discussed supportive shoes at all times and checking feet regularly.  -Mechanically debrided all nails 1-5 bilateral using sterile nail nipper and filed with dremel without incident  -Answered all patient questions -Patient to return  in 3 months for at risk foot care -Patient advised to call the office if any problems or questions arise in the meantime.   Lorenda Peck, DPM

## 2022-07-26 DIAGNOSIS — G3184 Mild cognitive impairment, so stated: Secondary | ICD-10-CM | POA: Diagnosis not present

## 2022-07-26 DIAGNOSIS — Z7984 Long term (current) use of oral hypoglycemic drugs: Secondary | ICD-10-CM | POA: Diagnosis not present

## 2022-07-26 DIAGNOSIS — Z7902 Long term (current) use of antithrombotics/antiplatelets: Secondary | ICD-10-CM | POA: Diagnosis not present

## 2022-07-26 DIAGNOSIS — I10 Essential (primary) hypertension: Secondary | ICD-10-CM | POA: Diagnosis not present

## 2022-07-26 DIAGNOSIS — Z7982 Long term (current) use of aspirin: Secondary | ICD-10-CM | POA: Diagnosis not present

## 2022-07-26 DIAGNOSIS — E119 Type 2 diabetes mellitus without complications: Secondary | ICD-10-CM | POA: Diagnosis not present

## 2022-07-26 DIAGNOSIS — F32A Depression, unspecified: Secondary | ICD-10-CM | POA: Diagnosis not present

## 2022-07-26 DIAGNOSIS — E785 Hyperlipidemia, unspecified: Secondary | ICD-10-CM | POA: Diagnosis not present

## 2022-07-26 DIAGNOSIS — Z741 Need for assistance with personal care: Secondary | ICD-10-CM | POA: Diagnosis not present

## 2022-07-30 ENCOUNTER — Other Ambulatory Visit: Payer: Self-pay | Admitting: Nurse Practitioner

## 2022-07-30 DIAGNOSIS — G3184 Mild cognitive impairment, so stated: Secondary | ICD-10-CM

## 2022-07-31 DIAGNOSIS — E119 Type 2 diabetes mellitus without complications: Secondary | ICD-10-CM | POA: Diagnosis not present

## 2022-07-31 DIAGNOSIS — F32A Depression, unspecified: Secondary | ICD-10-CM | POA: Diagnosis not present

## 2022-07-31 DIAGNOSIS — Z7902 Long term (current) use of antithrombotics/antiplatelets: Secondary | ICD-10-CM | POA: Diagnosis not present

## 2022-07-31 DIAGNOSIS — E785 Hyperlipidemia, unspecified: Secondary | ICD-10-CM | POA: Diagnosis not present

## 2022-07-31 DIAGNOSIS — Z7982 Long term (current) use of aspirin: Secondary | ICD-10-CM | POA: Diagnosis not present

## 2022-07-31 DIAGNOSIS — Z741 Need for assistance with personal care: Secondary | ICD-10-CM | POA: Diagnosis not present

## 2022-07-31 DIAGNOSIS — G3184 Mild cognitive impairment, so stated: Secondary | ICD-10-CM | POA: Diagnosis not present

## 2022-07-31 DIAGNOSIS — I10 Essential (primary) hypertension: Secondary | ICD-10-CM | POA: Diagnosis not present

## 2022-07-31 DIAGNOSIS — Z7984 Long term (current) use of oral hypoglycemic drugs: Secondary | ICD-10-CM | POA: Diagnosis not present

## 2022-08-08 ENCOUNTER — Encounter: Payer: Self-pay | Admitting: Nurse Practitioner

## 2022-08-08 ENCOUNTER — Ambulatory Visit (INDEPENDENT_AMBULATORY_CARE_PROVIDER_SITE_OTHER): Payer: Medicare Other | Admitting: Nurse Practitioner

## 2022-08-08 VITALS — BP 142/80 | HR 45 | Temp 98.2°F | Ht 63.0 in | Wt 167.0 lb

## 2022-08-08 DIAGNOSIS — Z23 Encounter for immunization: Secondary | ICD-10-CM | POA: Diagnosis not present

## 2022-08-08 DIAGNOSIS — I10 Essential (primary) hypertension: Secondary | ICD-10-CM

## 2022-08-08 DIAGNOSIS — G3184 Mild cognitive impairment, so stated: Secondary | ICD-10-CM | POA: Diagnosis not present

## 2022-08-08 DIAGNOSIS — E782 Mixed hyperlipidemia: Secondary | ICD-10-CM

## 2022-08-08 DIAGNOSIS — Z741 Need for assistance with personal care: Secondary | ICD-10-CM | POA: Diagnosis not present

## 2022-08-08 DIAGNOSIS — Z7984 Long term (current) use of oral hypoglycemic drugs: Secondary | ICD-10-CM | POA: Diagnosis not present

## 2022-08-08 DIAGNOSIS — F32A Depression, unspecified: Secondary | ICD-10-CM | POA: Diagnosis not present

## 2022-08-08 DIAGNOSIS — R519 Headache, unspecified: Secondary | ICD-10-CM

## 2022-08-08 DIAGNOSIS — F331 Major depressive disorder, recurrent, moderate: Secondary | ICD-10-CM

## 2022-08-08 DIAGNOSIS — E2839 Other primary ovarian failure: Secondary | ICD-10-CM

## 2022-08-08 DIAGNOSIS — E119 Type 2 diabetes mellitus without complications: Secondary | ICD-10-CM | POA: Diagnosis not present

## 2022-08-08 DIAGNOSIS — E785 Hyperlipidemia, unspecified: Secondary | ICD-10-CM | POA: Diagnosis not present

## 2022-08-08 DIAGNOSIS — Z7982 Long term (current) use of aspirin: Secondary | ICD-10-CM | POA: Diagnosis not present

## 2022-08-08 DIAGNOSIS — Z7902 Long term (current) use of antithrombotics/antiplatelets: Secondary | ICD-10-CM | POA: Diagnosis not present

## 2022-08-08 NOTE — Patient Instructions (Addendum)

## 2022-08-08 NOTE — Progress Notes (Signed)
I,Bridget Good,acting as a Education administrator for [employer], FNP.,have documented all relevant documentation on the behalf of Bridget Brine, FNP,as directed by  Bridget Brine, FNP while in the presence of Bridget Good, Bridget Good.  Subjective:     Patient ID: Bridget Good , female    DOB: 04-Oct-1951 , 71 y.o.   MRN: [id number]   Chief Complaint  Patient presents with   Hypertension   Diabetes    HPI  Patient is here for DM and HTN follow up.  She is accompanied by her son and her husband. She does drink sparkling water "Ice". Son feels memory is about the same. Jardiance and Rybelsus has been $400 per month for 30 day supply together. She is almost out of her Rybelsus as well he is with the bubble pack.   She is not retaining the information, daughter does not think they have been checking the blood pressure.   They are in the process of getting a cna to come to the house      Hypertension This is a chronic problem. The current episode started more than 1 year ago. The problem is unchanged. The problem is controlled. Associated symptoms include headaches (at least 3 times a week). Pertinent negatives include no anxiety, chest pain, palpitations or shortness of breath. Risk factors for coronary artery disease include sedentary lifestyle, obesity and diabetes mellitus. There are no compliance problems.  There is no history of angina. There is no history of chronic renal disease.  Diabetes She presents for her follow-up diabetic visit. She has type 2 diabetes mellitus. Her disease course has been improving. Hypoglycemia symptoms include headaches (at least 3 times a week). Pertinent negatives for hypoglycemia include no confusion, dizziness or nervousness/anxiousness. Pertinent negatives for diabetes include no chest pain, no fatigue, no polydipsia, no polyphagia, no polyuria and no weakness. There are no hypoglycemic complications. Symptoms are stable. There are no diabetic complications. Risk factors  for coronary artery disease include post-menopausal, hypertension, sedentary lifestyle and obesity. Current diabetic treatment includes oral agent (dual therapy). She is compliant with treatment most of the time. She has not had a previous visit with a dietitian. She rarely participates in exercise. (She has not been checking her blood sugar due to not knowing how to use the machine) She does not see a podiatrist.Eye exam is not current.     Past Medical History:  Diagnosis Date   Angina    Anxiety    Bronchitis    Depression    Diabetes mellitus    Fibromyalgia    GERD (gastroesophageal reflux disease)    Headache(784.0)    Hyperlipidemia    Hypertension    Hypertensive heart disease without CHF    Lupus ([place])    "treated for it from 1992 til 2012; dr said I don't have it anymore"   Obesity (BMI 30-39.9)    Osteoarthritis    Pneumonia    Shortness of breath    lying down, upon exertion   Shortness of breath on exertion    Stroke [facility])    2019     Family History  Problem Relation Age of Onset   Heart attack Father    Diabetes Father    Hypertension Mother    Heart attack Brother    Kidney failure Brother    Diabetes Sister    Diabetes Sister      Current Outpatient Medications:    acetaminophen (TYLENOL) 500 MG tablet, Take 1 tablet (500 mg total) by mouth  as needed for mild pain., Disp: 90 tablet, Rfl: 1   amLODipine-benazepril (LOTREL) 10-40 MG capsule, TAKE 1 CAPSULE BY MOUTH DAILY.(AM), Disp: 90 capsule, Rfl: 1   ASPIRIN LOW DOSE 81 MG tablet, TAKE ONE TABLET BY MOUTH ONCE DAILY (AM), Disp: 90 tablet, Rfl: 1   clopidogrel (PLAVIX) 75 MG tablet, TAKE ONE TABLET BY MOUTH ONCE DAILY (AM), Disp: 90 tablet, Rfl: 1   diclofenac Sodium (VOLTAREN) 1 % GEL, APPLY 2 GRAMS TOPICALLY 4 (FOUR) TIMES DAILY. (Patient not taking: Reported on 06/05/2022), Disp: 100 g, Rfl: 2   donepezil (ARICEPT) 5 MG tablet, TAKE 1 TABLET (5 MG TOTAL) BY MOUTH AT BEDTIME., Disp: 30 tablet, Rfl: 2    escitalopram (LEXAPRO) 10 MG tablet, Take 1 tablet (10 mg total) by mouth daily., Disp: 90 tablet, Rfl: 1   JARDIANCE 10 MG TABS tablet, TAKE 1 TABLET (10 MG TOTAL) BY MOUTH DAILY. (AM), Disp: 90 tablet, Rfl: 3   metoprolol succinate (TOPROL-XL) 50 MG 24 hr tablet, TAKE 1 TABLET BY MOUTH DAILY. TAKE WITH OR IMMEDIATELY FOLLOWING A MEAL (AM), Disp: 90 tablet, Rfl: 1   nitroGLYCERIN (NITROSTAT) 0.4 MG SL tablet, Place 1 tablet (0.4 mg total) under the tongue every 5 (five) minutes as needed for chest pain., Disp: 25 tablet, Rfl: 12   pantoprazole (PROTONIX) 40 MG tablet, TAKE ONE TABLET BY MOUTH DAILY IN THE MORNING., Disp: 90 tablet, Rfl: 1   potassium chloride SA (KLOR-CON M) 20 MEQ tablet, TAKE ONE TABLET BY MOUTH ONCE DAILY (AM), Disp: 90 tablet, Rfl: 1   rosuvastatin (CRESTOR) 20 MG tablet, TAKE ONE TABLET BY MOUTH ONCE DAILY (BEDTIME), Disp: 90 tablet, Rfl: 1   Semaglutide (RYBELSUS) 3 MG TABS, Take 1 tablet by mouth daily. Take 30 minutes before breakfast, Disp: 90 tablet, Rfl: 1   Vitamin D, Ergocalciferol, (DRISDOL) 1.25 MG (50000 UNIT) CAPS capsule, TAKE 1 CAPSULE TWICE A WEEK ( MONDAY & THURSDAY), Disp: 24 capsule, Rfl: 1   Allergies  Allergen Reactions   Sulfa Antibiotics Hives, Itching and Nausea And Vomiting     Review of Systems  Constitutional: Negative.  Negative for fatigue.  Respiratory: Negative.  Negative for shortness of breath.   Cardiovascular: Negative.  Negative for chest pain, palpitations and leg swelling.  Gastrointestinal: Negative.   Endocrine: Negative for polydipsia, polyphagia and polyuria.  Neurological:  Positive for headaches (at least 3 times a week). Negative for dizziness and weakness.  Psychiatric/Behavioral:  Negative for confusion. The patient is not nervous/anxious.      Today's Vitals   08/08/22 1205  BP: (!) 142/80  Pulse: (!) 45  Temp: 98.2 F (36.8 C)  TempSrc: Oral  Weight: 167 lb (75.8 kg)  Height: '5\' 3"'$  (1.6 m)   Body mass index is  29.58 kg/m.   Objective:  Physical Exam Vitals reviewed.  Constitutional:      General: She is not in acute distress.    Appearance: Normal appearance.  Cardiovascular:     Rate and Rhythm: Normal rate and regular rhythm.     Pulses: Normal pulses.     Heart sounds: Normal heart sounds. No murmur heard. Pulmonary:     Effort: Pulmonary effort is normal. No respiratory distress.     Breath sounds: Normal breath sounds. No wheezing.  Skin:    Capillary Refill: Capillary refill takes less than 2 seconds.  Neurological:     General: No focal deficit present.     Mental Status: She is alert and oriented to person,  place, and time.     Cranial Nerves: No cranial nerve deficit.     Motor: No weakness.  Psychiatric:        Mood and Affect: Mood normal.        Behavior: Behavior normal.        Thought Content: Thought content normal.        Judgment: Judgment normal.         Assessment And Plan:     1. Benign essential hypertension Comments: Blood pressuer is slightly elevated. She has been encouraged to limit salt intake and stay well hydrated with water.  2. Type 2 diabetes mellitus without complication, without long-term current use of insulin (H[facility]) Comments: HgbA1c is stable, doing better with her medications. She will be working with pharmacist on patient assistance. Samples of Rybelsus and Jardiance given. - Ambulatory referral to Ophthalmology  3. Moderate episode of recurrent major depressive disorder ([place]) Comments: She is doing well with the medications and tolerating well, continue at current dose.  4. Mixed hyperlipidemia Comments: Cholesterol levels are stable, continue statin. Tolerating medications well.   5. Nonintractable episodic headache, unspecified headache type Comments: Unable to describe, tylenol has been effective. Will see if she has followed up with Neurology  6. Decreased estrogen level Scheduled for 12/12/2022  7. Encounter for  immunization Prevnar 20 given in office. - Pneumococcal conjugate vaccine 20-valent ([id number])     Patient was given opportunity to ask questions. Patient verbalized understanding of the plan and was able to repeat key elements of the plan. All questions were answered to their satisfaction.  Bridget Brine, FNP   I, Bridget Brine, FNP, have reviewed all documentation for this visit. The documentation on 08/08/22 for the exam, diagnosis, procedures, and orders are all accurate and complete.   IF YOU HAVE BEEN REFERRED TO A SPECIALIST, IT MAY TAKE 1-2 WEEKS TO SCHEDULE/PROCESS THE REFERRAL. IF YOU HAVE NOT HEARD FROM US/SPECIALIST IN TWO WEEKS, PLEASE GIVE [place] A CALL AT [phone] X [phone].   THE PATIENT IS ENCOURAGED TO PRACTICE SOCIAL DISTANCING DUE TO THE COVID-19 PANDEMIC.

## 2022-08-14 DIAGNOSIS — Z7984 Long term (current) use of oral hypoglycemic drugs: Secondary | ICD-10-CM | POA: Diagnosis not present

## 2022-08-14 DIAGNOSIS — I10 Essential (primary) hypertension: Secondary | ICD-10-CM | POA: Diagnosis not present

## 2022-08-14 DIAGNOSIS — G3184 Mild cognitive impairment, so stated: Secondary | ICD-10-CM | POA: Diagnosis not present

## 2022-08-14 DIAGNOSIS — F32A Depression, unspecified: Secondary | ICD-10-CM | POA: Diagnosis not present

## 2022-08-14 DIAGNOSIS — Z7982 Long term (current) use of aspirin: Secondary | ICD-10-CM | POA: Diagnosis not present

## 2022-08-14 DIAGNOSIS — Z7902 Long term (current) use of antithrombotics/antiplatelets: Secondary | ICD-10-CM | POA: Diagnosis not present

## 2022-08-14 DIAGNOSIS — E119 Type 2 diabetes mellitus without complications: Secondary | ICD-10-CM | POA: Diagnosis not present

## 2022-08-14 DIAGNOSIS — E785 Hyperlipidemia, unspecified: Secondary | ICD-10-CM | POA: Diagnosis not present

## 2022-08-14 DIAGNOSIS — Z741 Need for assistance with personal care: Secondary | ICD-10-CM | POA: Diagnosis not present

## 2022-08-20 DIAGNOSIS — G3184 Mild cognitive impairment, so stated: Secondary | ICD-10-CM | POA: Diagnosis not present

## 2022-08-20 DIAGNOSIS — F32A Depression, unspecified: Secondary | ICD-10-CM | POA: Diagnosis not present

## 2022-08-20 DIAGNOSIS — E785 Hyperlipidemia, unspecified: Secondary | ICD-10-CM | POA: Diagnosis not present

## 2022-08-20 DIAGNOSIS — Z7984 Long term (current) use of oral hypoglycemic drugs: Secondary | ICD-10-CM | POA: Diagnosis not present

## 2022-08-20 DIAGNOSIS — E119 Type 2 diabetes mellitus without complications: Secondary | ICD-10-CM | POA: Diagnosis not present

## 2022-08-20 DIAGNOSIS — Z741 Need for assistance with personal care: Secondary | ICD-10-CM | POA: Diagnosis not present

## 2022-08-20 DIAGNOSIS — Z7902 Long term (current) use of antithrombotics/antiplatelets: Secondary | ICD-10-CM | POA: Diagnosis not present

## 2022-08-20 DIAGNOSIS — I10 Essential (primary) hypertension: Secondary | ICD-10-CM | POA: Diagnosis not present

## 2022-08-20 DIAGNOSIS — Z7982 Long term (current) use of aspirin: Secondary | ICD-10-CM | POA: Diagnosis not present

## 2022-09-03 ENCOUNTER — Telehealth: Payer: Self-pay

## 2022-09-03 NOTE — Chronic Care Management (AMB) (Signed)
    JERLYN PAIN daughter was reminded to have all medications, supplements and any blood glucose and blood pressure readings available for review with [name], Pharm. D, at her telephone visit on at 3:00.   Questions: Have you had any recent office visit or specialist visit outside of [facility]? Patient's daughter stated no  Are there any concerns you would like to discuss during your office visit? Patient's daughter stated no  Are you having any problems obtaining your medications? (Whether it pharmacy issues or cost) Patient stated Jardiance and rybelsus is expensive. Patient assistance applications started.  If patient has any PAP medications ask if they are having any problems getting their PAP medication or refill? No PAP medications  Care Gaps: Shingrix overdue Covid booster overdue Flu vaccine overdue  Star Rating Drug: Lotrel 10-40 mg- Last filled 08-23-2022 [facility] Rosuvastatin 20 mg- Last filled 08-23-2022 [facility] Rybelsus 7 mg- Samples  Jardiance 10 mg- Samples  Any gaps in medications fill history? No   [place] Pharmacist Assistant [phone]

## 2022-09-04 DIAGNOSIS — I10 Essential (primary) hypertension: Secondary | ICD-10-CM | POA: Diagnosis not present

## 2022-09-04 DIAGNOSIS — Z7902 Long term (current) use of antithrombotics/antiplatelets: Secondary | ICD-10-CM | POA: Diagnosis not present

## 2022-09-04 DIAGNOSIS — E119 Type 2 diabetes mellitus without complications: Secondary | ICD-10-CM | POA: Diagnosis not present

## 2022-09-04 DIAGNOSIS — F32A Depression, unspecified: Secondary | ICD-10-CM | POA: Diagnosis not present

## 2022-09-04 DIAGNOSIS — G3184 Mild cognitive impairment, so stated: Secondary | ICD-10-CM | POA: Diagnosis not present

## 2022-09-04 DIAGNOSIS — Z7984 Long term (current) use of oral hypoglycemic drugs: Secondary | ICD-10-CM | POA: Diagnosis not present

## 2022-09-04 DIAGNOSIS — Z7982 Long term (current) use of aspirin: Secondary | ICD-10-CM | POA: Diagnosis not present

## 2022-09-04 DIAGNOSIS — E785 Hyperlipidemia, unspecified: Secondary | ICD-10-CM | POA: Diagnosis not present

## 2022-09-04 DIAGNOSIS — Z741 Need for assistance with personal care: Secondary | ICD-10-CM | POA: Diagnosis not present

## 2022-09-05 ENCOUNTER — Telehealth: Payer: Self-pay

## 2022-09-05 ENCOUNTER — Ambulatory Visit (INDEPENDENT_AMBULATORY_CARE_PROVIDER_SITE_OTHER): Payer: Medicare Other

## 2022-09-05 DIAGNOSIS — I10 Essential (primary) hypertension: Secondary | ICD-10-CM

## 2022-09-05 DIAGNOSIS — E119 Type 2 diabetes mellitus without complications: Secondary | ICD-10-CM

## 2022-09-05 NOTE — Progress Notes (Signed)
Chronic Care Management Pharmacy Note  09/07/2022 Name:  Bridget Good MRN:  [id number] DOB:  04-Mar-1951  Summary: Mr. Demonte reports that he and his sister are very concerned about the cost of their mothers medications.   Recommendations/Changes made from today's visit: Recommend patient be taken off Rybelsus 3 mg which is the starting dose Recommend patient assistance application be sent    Plan: Collaborate with PCP to discuss patients medication regimen changes  Print out patient assistance paper work for patient to sign    Subjective: Bridget Good is an 71 y.o. year old female who is a primary patient of [name], [place].  The CCM team was consulted for assistance with disease management and care coordination needs.    Engaged with patient by telephone for initial visit in response to provider referral for pharmacy case management and/or care coordination services.  Spoke with patients son and he is actually doing something right now, so he can talk with me. Patients daughter is on the phone, she has a bubble packet of medication from [employer]. She has a medication packaging system that she is using. She is assisted by home health and also by her children. She does okay with reminders but sometimes the evening medications are confusing. Patients son and daughter reported having there mother on three way during the time of the appointment. I explained that when I called it went straight to voicemail. They reported that might be why it went to straight to voicemail.  Per son and daughter they are at work right now but would still like to have the appointment.   Consent to Services:  The patient was given information about Chronic Care Management services, agreed to services, and gave verbal consent prior to initiation of services.  Please see initial visit note for detailed documentation.   Patient Care Team: [name], FNP as PCP - General (General  Practice) Nahser, Wonda Cheng, MD as PCP - Cardiology (Cardiology) Jacolyn Reedy, MD as Consulting Physician (Cardiology)  Recent office visits: 08/08/2022 PCP OV 06/05/2022 PCP OV  Recent consult visits: 07/18/2022 Podiatry OV 03/27/2022 [facility] visits: 06/20/2022 Urgent Care: acute viral conjunctivitis   Objective:  Lab Results  Component Value Date   CREATININE 1.35 (H) 06/05/2022   BUN 18 06/05/2022   EGFR 43 (L) 06/05/2022   GFRNONAA 39 (L) 01/11/2020   GFRAA 45 (L) 01/11/2020   NA 142 06/05/2022   K 4.7 06/05/2022   CALCIUM 10.2 06/05/2022   CO2 24 06/05/2022   GLUCOSE 67 (L) 06/05/2022    Lab Results  Component Value Date/Time   HGBA1C 6.4 (H) 06/05/2022 03:30 PM   HGBA1C 7.2 (H) 12/19/2021 04:56 PM   MICROALBUR 80 08/24/2021 04:50 PM   MICROALBUR 150 01/11/2020 11:43 AM    Last diabetic Eye exam:  Lab Results  Component Value Date/Time   HMDIABEYEEXA Retinopathy (A) 06/25/2018 12:00 AM   HMDIABEYEEXA Retinopathy (A) 06/25/2018 12:00 AM    Last diabetic Foot exam: No results found for: "HMDIABFOOTEX"   Lab Results  Component Value Date   CHOL 198 06/05/2022   HDL 76 06/05/2022   LDLCALC 107 (H) 06/05/2022   TRIG 82 06/05/2022   CHOLHDL 2.6 06/05/2022       Latest Ref Rng & Units 06/05/2022    3:30 PM 06/27/2021    4:34 PM 01/11/2020   11:48 AM  Hepatic Function  Total Protein 6.0 - 8.5 g/dL 7.2  6.8  6.5  Albumin 3.8 - 4.8 g/dL 4.4  4.1  3.9   AST 0 - 40 IU/L '17  16  13   ' ALT 0 - 32 IU/L '10  11  8   ' Alk Phosphatase 44 - 121 IU/L 65  90  79   Total Bilirubin 0.0 - 1.2 mg/dL 1.2  0.8  1.2     Lab Results  Component Value Date/Time   TSH 0.964 06/27/2021 04:34 PM   TSH 0.944 01/11/2020 11:48 AM       Latest Ref Rng & Units 06/05/2022    3:30 PM 12/19/2021    4:56 PM 06/27/2021    4:34 PM  CBC  WBC 3.4 - 10.8 x10E3/uL 5.3  4.7  4.9   Hemoglobin 11.1 - 15.9 g/dL 13.7  13.5  14.3   Hematocrit 34.0 - 46.6 % 43.0  40.5  44.3    Platelets 150 - 450 x10E3/uL 287  278  284     Lab Results  Component Value Date/Time   VD25OH 53.2 06/27/2021 04:34 PM   VD25OH 49.5 01/11/2020 11:48 AM    Clinical ASCVD: Yes  The ASCVD Risk score (Arnett DK, et al., 2019) failed to calculate for the following reasons:   The patient has a prior MI or stroke diagnosis       06/05/2022    2:12 PM 08/24/2021    4:12 PM 06/27/2021    3:34 PM  Depression screen PHQ 2/9  Decreased Interest 3 0 0  Down, Depressed, Hopeless 1 0 1  PHQ - 2 Score 4 0 1  Altered sleeping 0  0  Tired, decreased energy 0  1  Change in appetite 3    Feeling bad or failure about yourself  0  0  Trouble concentrating 0  1  Moving slowly or fidgety/restless 0  3  Suicidal thoughts 0  0  PHQ-9 Score 7  6  Difficult doing work/chores Not difficult at all  Not difficult at all     Social History   Tobacco Use  Smoking Status Never  Smokeless Tobacco Never   BP Readings from Last 3 Encounters:  08/08/22 (!) 142/80  06/20/22 128/84  06/05/22 104/60   Pulse Readings from Last 3 Encounters:  08/08/22 (!) 45  06/20/22 (!) 59  06/05/22 (!) 55   Wt Readings from Last 3 Encounters:  08/08/22 167 lb (75.8 kg)  06/20/22 178 lb (80.7 kg)  06/05/22 166 lb 9.6 oz (75.6 kg)   BMI Readings from Last 3 Encounters:  08/08/22 29.58 kg/m  06/20/22 31.53 kg/m  06/05/22 29.51 kg/m    Assessment/Interventions: Review of patient past medical history, allergies, medications, health status, including review of consultants reports, laboratory and other test data, was performed as part of comprehensive evaluation and provision of chronic care management services.   SDOH:  (Social Determinants of Health) assessments and interventions performed: Yes SDOH Interventions    Flowsheet Row Chronic Care Management from 05/31/2020 in [facility] from 04/11/2020 in [facility] Clinical Support from  10/07/2019 in [facility] Chronic Care Management from 03/09/2019 in [facility] Office Visit from 11/26/2018 in [facility] Nutrition from 08/18/2018 in [facility]  SDOH Interventions        Depression Interventions/Treatment  -- -- Currently on Treatment, PHQ2-9 Score <4 Follow-up Not Indicated --  [pt reports has sertraline prescription but not taking. Declines referral to psychiatry] Medication,  Referral to Psychiatry --  [provided therapist resources]  Financial Strain Interventions Other (Comment)  [Will assist patient in applying for Jardiance patient assistance through [employer]] -- -- -- -- --  Physical Activity Interventions -- Local [employer], Other (Comments)  [Encouraged patient to increase physical activity to 10 minutes 5 times weekly. Goal is 30 minutes 5 times weekly] -- -- -- --      SDOH Screenings   Food Insecurity: No Food Insecurity (08/24/2021)  Housing: High Risk (03/09/2019)  Transportation Needs: No Transportation Needs (08/24/2021)  Alcohol Screen: Low Risk  (03/09/2019)  Depression (PHQ2-9): Medium Risk (06/05/2022)  Financial Resource Strain: Low Risk  (08/24/2021)  Physical Activity: Inactive (08/24/2021)  Social Connections: Socially Integrated (03/09/2019)  Stress: No Stress Concern Present (08/24/2021)  Tobacco Use: Low Risk  (08/08/2022)    CCM Care Plan  Allergies  Allergen Reactions   Sulfa Antibiotics Hives, Itching and Nausea And Vomiting    Medications Reviewed Today     Reviewed by [name], FNP (Family Nurse Practitioner) on 08/08/22 at 1252  Med List Status: <None>   Medication Order Taking? Sig Documenting Provider Last Dose Status Informant  acetaminophen (TYLENOL) 500 MG tablet [id number] No Take 1 tablet (500 mg total) by mouth as needed for mild pain. [name], FNP 06/19/2022 Active   amLODipine-benazepril (LOTREL) 10-40 MG capsule [id number]  TAKE  1 CAPSULE BY MOUTH DAILY.(AM) [name], FNP  Active   ASPIRIN LOW DOSE 81 MG tablet [id number]  TAKE ONE TABLET BY MOUTH ONCE DAILY (AM) [name], FNP  Active   clopidogrel (PLAVIX) 75 MG tablet [id number]  TAKE ONE TABLET BY MOUTH ONCE DAILY (AM) [name], FNP  Active   diclofenac Sodium (VOLTAREN) 1 % GEL [id number] No APPLY 2 GRAMS TOPICALLY 4 (FOUR) TIMES DAILY.  Patient not taking: Reported on 06/05/2022   [name], FNP Not Taking Active   donepezil (ARICEPT) 5 MG tablet [id number]  TAKE 1 TABLET (5 MG TOTAL) BY MOUTH AT BEDTIME. [name], FNP  Active   escitalopram (LEXAPRO) 10 MG tablet [id number] No Take 1 tablet (10 mg total) by mouth daily. [name], FNP 06/19/2022 Active   JARDIANCE 10 MG TABS tablet [id number] No TAKE 1 TABLET (10 MG TOTAL) BY MOUTH DAILY. (AM) [name], FNP 06/19/2022 Active   metoprolol succinate (TOPROL-XL) 50 MG 24 hr tablet [id number]  TAKE 1 TABLET BY MOUTH DAILY. TAKE WITH OR IMMEDIATELY FOLLOWING A MEAL (AM) [name], FNP  Active   nitroGLYCERIN (NITROSTAT) 0.4 MG SL tablet [id number] No Place 1 tablet (0.4 mg total) under the tongue every 5 (five) minutes as needed for chest pain. [name], FNP Taking Active   pantoprazole (PROTONIX) 40 MG tablet [id number]  TAKE ONE TABLET BY MOUTH DAILY IN THE MORNING. [name], FNP  Active   potassium chloride SA (KLOR-CON M) 20 MEQ tablet [id number]  TAKE ONE TABLET BY MOUTH ONCE DAILY (AM) [name], FNP  Active   rosuvastatin (CRESTOR) 20 MG tablet [id number] No TAKE ONE TABLET BY MOUTH ONCE DAILY (BEDTIME) [name], FNP 06/19/2022 Active   Semaglutide (RYBELSUS) 3 MG TABS [id number] No Take 1 tablet by mouth daily. 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   Anxiety about health 02/09/2019   Brain tumor ([place]) 02/04/2019   Arm bruise, left, initial encounter 01/07/2019   Anxiety 12/30/2018   Decreased estrogen level 12/23/2018   Abnormal blood chemistry 12/23/2018   Depression 12/23/2018   CNS mass 12/15/2018   Stroke ([place]) 12/15/2018   Hypertensive retinopathy 12/08/2018   Palpitations 07/16/2018   Cerebral thrombosis with cerebral infarction 07/12/2018   Chest pain 07/11/2018   Abnormal EKG 07/11/2018   AKI (acute kidney injury) ([place]) 07/11/2018   Essential hypertension    Retrognathia 07/10/2018   Psychophysiological insomnia 07/10/2018   Snoring 07/10/2018   History of lupus 01/02/2012   CAD (coronary artery disease)    Dyslipidemia associated with type 2 diabetes mellitus ([place])    Hypertensive heart disease without CHF    Mixed hyperlipidemia    Obesity (BMI 30-39.9)    GERD (gastroesophageal reflux disease)    Osteoarthritis     Immunization History  Administered Date(s) Administered   Fluad Quad(high Dose 65+) 08/24/2021   Influenza Inj Mdck Quad Pf 01/02/2018   Influenza, High Dose Seasonal PF 09/19/2018   Influenza,inj,Quad PF,6+ Mos 12/06/2016   Moderna Sars-Covid-2 Vaccination 02/01/2020, 03/15/2020, 08/23/2020   PNEUMOCOCCAL CONJUGATE-20 08/08/2022   Pneumococcal Polysaccharide-23 11/26/2018   Tdap 06/05/2022    Conditions to be addressed/monitored:  Hypertension and Diabetes  Care Plan : [place]  Updates made by Mayford Knife, [place] since 09/07/2022 12:00 AM     Problem: HTN, DM II      Goal: Disease Management   Note:   Current Barriers:  Unable to independently monitor therapeutic efficacy  Pharmacist Clinical Goal(s):  Patient will achieve adherence to monitoring guidelines and medication adherence to achieve therapeutic efficacy through collaboration with PharmD and  provider.   Interventions: 1:1 collaboration with [name], FNP regarding development and update of comprehensive plan of care as evidenced by provider attestation and co-signature Inter-disciplinary care team collaboration (see longitudinal plan of care) Comprehensive medication review performed; medication list updated in electronic medical record  Hypertension (BP goal <130/80) -Controlled -Current treatment: Amlodipine-Benazepril 10-40 mg capsule once per day Appropriate, Effective, Safe, Query accessible Metoprolol Succinate 50 mg tablet take 1 daily Appropriate, Effective, Safe, Query accessible -Current home readings: currently not checking BP at home  -Current dietary habits: chicken sandwich, fish for dinner and fruits through out the day - she sometimes grapes  -Current exercise habits: not often getting exercise  -Denies hypotensive/hypertensive symptoms -Educated on Importance of home blood pressure monitoring; -Counseled to monitor BP at home three times per week, document, and provide log at future appointments -Recommended to continue current medication  Diabetes (A1c goal <7%) -Controlled -Current medications: Jardiance 10 mg tablet once per day Appropriate, Effective, Safe, Query accessible She has 3 weeks left of Jardiance 10 mg tablet samples   Semaglutide 3 mg tablet once per day Appropriate, Effective, Safe, Query accessible She has 3 weeks left  -Medications previously tried: none noted at this time  -Current home glucose readings: they are not currently checking her glucose at home  -Denies hypoglycemic/hyperglycemic symptoms  -Current meal patterns: patients family reports that she eats sometimes, but she does not always eat.  -Current exercise: will discuss further during next office visit.  -Educated on Prevention and management of hypoglycemic episodes; Benefits of routine self-monitoring of blood sugar; -Discussed the dose of Rybelsus is not for  treatment for diabetes. We  reviewed why her medication was decreased from 7 mg to 3 mg, per daughter she was concerned about the side effects.  -Reviewed the benefits of Jardiance for kidney health, diabetes and heart health.  -Per patients son the cost of Jardiance and Rybelsus are over 500 dollars and unaffordable.  -I explained how patient assistance works and let them know that paperwork would be available at the office if they wanted to come in and fill it out due to concerns abut time.  -Counseled to check feet daily and get yearly eye exams -Collaborated with PCP to discontinue rybelsus   Patient Goals/Self-Care Activities Patient will:  - take medications as prescribed as evidenced by patient report and record review  Follow Up Plan: The patient has been provided with contact information for the care management team and has been advised to call with any health related questions or concerns.       Medication Assistance: Application for Jardiance  medication assistance program. in process.  Anticipated assistance start date 10/2022.  See plan of care for additional detail.  Compliance/Adherence/Medication fill history: Care Gaps: Shingrix Vaccine COVID-19 Vaccine Influenza Vaccine   Star-Rating Drugs: Jardiance 10 mg tablet  Rosuvastatin 20 mg tablet  Rybelsus 3 mg tablet   Patient's preferred pharmacy is:  [place], [place] - [address] [address] Phone: [phone] Fax: [phone]  [id number] [place] [place]Phone: [phone]: [phone] Uses pill box? Yes Pt endorses 80% compliance  We discussed: Benefits of medication synchronization, packaging and delivery as well as enhanced pharmacist oversight with [employer]. Patient decided to: Continue current medication management  strategy  Care Plan and Follow Up Patient Decision:  Patient agrees to Care Plan and Follow-up.  Plan: The patient has been provided with contact information for the care management team and has been advised to call with any health related questions or concerns.   VOrlando Penner CPP, PharmD Clinical Pharmacist Practitioner [facility] [phone]

## 2022-09-05 NOTE — Telephone Encounter (Signed)
  Care Management   Follow Up Note   09/05/2022 Name: Bridget Good MRN: [id number] DOB: 10-11-1951   Referred by: [name], FNP Reason for referral : No chief complaint on file.   An unsuccessful telephone outreach was attempted today. The patient was referred to the case management team for assistance with care management and care coordination.   Follow Up Plan: The patient has been provided with contact information for the care management team and has been advised to call with any health related questions or concerns.   [name], CPP, PharmD Clinical Pharmacist Practitioner [facility] [phone]

## 2022-09-07 NOTE — Patient Instructions (Signed)
Visit Information It was great speaking with you today!  Please let me know if you have any questions about our visit.   Goals Addressed             This Visit's Progress    Manage My Medicine       Timeframe:  Long-Range Goal Priority:  High Start Date:                             Expected End Date:                       Follow Up Date 11/30/2022    - call for medicine refill 2 or 3 days before it runs out - call if I am sick and can't take my medicine - keep a list of all the medicines I take; vitamins and herbals too - learn to read medicine labels - use a pillbox to sort medicine    Why is this important?   These steps will help you keep on track with your medicines.   Notes:         Patient Care Plan: CCM Pharmacy Care Plan     Problem Identified: HTN, DM II      Goal: Disease Management   Note:   Current Barriers:  Unable to independently monitor therapeutic efficacy  Pharmacist Clinical Goal(s):  Patient will achieve adherence to monitoring guidelines and medication adherence to achieve therapeutic efficacy through collaboration with PharmD and provider.   Interventions: 1:1 collaboration with [name], FNP regarding development and update of comprehensive plan of care as evidenced by provider attestation and co-signature Inter-disciplinary care team collaboration (see longitudinal plan of care) Comprehensive medication review performed; medication list updated in electronic medical record  Hypertension (BP goal <130/80) -Controlled -Current treatment: Amlodipine-Benazepril 10-40 mg capsule once per day Appropriate, Effective, Safe, Query accessible Metoprolol Succinate 50 mg tablet take 1 daily Appropriate, Effective, Safe, Query accessible -Current home readings: currently not checking BP at home  -Current dietary habits: chicken sandwich, fish for dinner and fruits through out the day - she sometimes grapes  -Current exercise habits: not often  getting exercise  -Denies hypotensive/hypertensive symptoms -Educated on Importance of home blood pressure monitoring; -Counseled to monitor BP at home three times per week, document, and provide log at future appointments -Recommended to continue current medication  Diabetes (A1c goal <7%) -Controlled -Current medications: Jardiance 10 mg tablet once per day Appropriate, Effective, Safe, Query accessible She has 3 weeks left of Jardiance 10 mg tablet samples   Semaglutide 3 mg tablet once per day Appropriate, Effective, Safe, Query accessible She has 3 weeks left  -Medications previously tried: none noted at this time  -Current home glucose readings: they are not currently checking her glucose at home  -Denies hypoglycemic/hyperglycemic symptoms  -Current meal patterns: patients family reports that she eats sometimes, but she does not always eat.  -Current exercise: will discuss further during next office visit.  -Educated on Prevention and management of hypoglycemic episodes; Benefits of routine self-monitoring of blood sugar; -Discussed the dose of Rybelsus is not for treatment for diabetes. We reviewed why her medication was decreased from 7 mg to 3 mg, per daughter she was concerned about the side effects.  -Reviewed the benefits of Jardiance for kidney health, diabetes and heart health.  -Per patients son the cost of Jardiance and Rybelsus are over 500 dollars and unaffordable.  -I  explained how patient assistance works and let them know that paperwork would be available at the office if they wanted to come in and fill it out due to concerns abut time.  -Counseled to check feet daily and get yearly eye exams -Collaborated with PCP to discontinue rybelsus   Patient Goals/Self-Care Activities Patient will:  - take medications as prescribed as evidenced by patient report and record review  Follow Up Plan: The patient has been provided with contact information for the care management  team and has been advised to call with any health related questions or concerns.      Patient agreed to services and verbal consent obtained.   The patient verbalized understanding of instructions, educational materials, and care plan provided today and agreed to receive a mailed copy of patient instructions, educational materials, and care plan.   [name], PharmD Clinical Pharmacist [facility] [phone]

## 2022-09-07 NOTE — Telephone Encounter (Signed)
Opened in error

## 2022-09-08 DIAGNOSIS — E1159 Type 2 diabetes mellitus with other circulatory complications: Secondary | ICD-10-CM | POA: Diagnosis not present

## 2022-09-08 DIAGNOSIS — Z7984 Long term (current) use of oral hypoglycemic drugs: Secondary | ICD-10-CM

## 2022-09-08 DIAGNOSIS — I10 Essential (primary) hypertension: Secondary | ICD-10-CM | POA: Diagnosis not present

## 2022-09-12 NOTE — Progress Notes (Signed)
Chief Complaint  Patient presents with   Follow-up    Pt in room #2 with her husband and son. Pt here today for     HISTORY OF PRESENT ILLNESS:  09/13/22 ALL:  Bridget Good is a 71 y.o. female here today for follow up. She was last seen by Dr Vickey Huger 12/2019 for insomnia and memory loss thought to be related to depression. She was encouraged to consider counseling and SSRI. Melatonin 5mg  recommended for insomnia and dental device for snoring. Previous sleep study in 2020 was unremarkable. MMSE was 25/30 in 2021.   Since, she has continued to have progressive memory loss. She presents with her son, Bridget Good, and husband who assist with history. Bridget Good is also a patient of mine followed for dementia. She reports having more difficulty with short term memory and recall. Son states that she needs more guidance with ADLs around the home. Medicaitons are provided in pill pack but she is able to remember to take them. She does light cooking. She does not drive. She continues to have significant anxiety. She is very tearful in the room, today. Bridget Good, PCP, added donepezil 5mg  daily. She has tolerated it well. She does have visual and auditory hallucinations, worse at night. There does not seem to be any worsening with starting donepezil. She is not sleeping well. She gets afraid at night that someone is trying to get in the home.   MRI 2021 showed: Moderate chronic small-vessel ischemic changes throughout the brain. No acute brain finding.   Slowly progressive marrow space lesion of the clivus. Newly seen marrow space lesion of the C2 vertebral body. Metastatic disease and myeloma are the primary concern in this patient, though the course of progression is quite slow. Multifocal Paget's disease is possible.  NO repeat imaging found in Epic.    HISTORY (copied from Dr Dohmeier's previous note)  HPI: Bridget Good. Bridget Good is a 71 year old right-handed African-American female  patient seen here in the presence of her husband.  She used to see Dr. Juleen China for many years until he retired and has now established primary care with tried another medicine.  Her past medical history is positive for hypertension, hyperlipidemia and diabetes mellitus.  She also has complaints about anxiety headaches and recently developed less restorative sleep and problems with insomnia. She feels under a lot of stress. Her husband is retired- the couple now runs a Camera operator business. Her husband had resigned after 25 years from a church, after a conflict with the leadership.    Bridget Good is a 71 y.o. female patient, seen today on follow up on her sleep study and memory concerns. 12-17-2019.  For this visit, she underwent a Mini-Mental status today with exam short 25 out of 30 points the patient is a made a little error with the drawing of a clock face but corrected herself she was able to name 11 animals and her drawing was good.  She wrote a sentence she was able to spell backwards but did not do well with calculation, I would like to add that recall was impaired which tells me that her short-term memory is her main problem.   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 She was able to do the serial 7 last time. Sleep has improved on melatonin.   CM 06-16-2019 , The polysomnography was performed on 29 July 2018, and the patient did not have any significant sleep apnea at the time  nor periodic limb movements.  She did normal EKG 2 there was some sinus bradycardia but no tachycardia.  She slept over 6 hours without fragmentation of sleep.  The only recommendation I had at the time was to avoid sleeping in supine as snoring was louder in that position and may interrupt her fragmented sleep.  At least during the sleep lab stay there was no fragmentation.  I wanted to make sure that I recap her apnea hypopnea index it was 4.4 and her REM sleep AHI was 12.6.  We meet today because the  patient has been rereferred for concerns of cognitive function or dysfunction.  My nurse performed a Mini-Mental status test today with Bridget Good and she scored 24 out of 30 points which is lower than expected.  It was really is a short-term memory that is most affected orientation abstraction and following multitasks looked fine.  She was also well able to do attention and calculation.   She had developed some daytime sleepiness, was ' groggy' on melatonin. I am not sure which dose she took.  her daughter brought her her : " Mother os more forgetful, frustrated, simple tasks, misplacing items"  The patient is worked up for sarcoidosis, lupus, and was cleared of cancer. Her husband had retired also to take care of her and became depressed, grumpy.  That seems to became an issue in quarantine times, cooped up together  she never filled or took the ZOLOFT I recommended   REVIEW OF SYSTEMS: Out of a complete 14 system review of symptoms, the patient complains only of the following symptoms, anxiety, depression, halucinations, memory loss, and all other reviewed systems are negative.   ALLERGIES: Allergies  Allergen Reactions   Sulfa Antibiotics Hives, Itching and Nausea And Vomiting     HOME MEDICATIONS: Outpatient Medications Prior to Visit  Medication Sig Dispense Refill   acetaminophen (TYLENOL) 500 MG tablet Take 1 tablet (500 mg total) by mouth as needed for mild pain. 90 tablet 1   amLODipine-benazepril (LOTREL) 10-40 MG capsule TAKE 1 CAPSULE BY MOUTH DAILY.(AM) 90 capsule 1   ASPIRIN LOW DOSE 81 MG tablet TAKE ONE TABLET BY MOUTH ONCE DAILY (AM) 90 tablet 1   clopidogrel (PLAVIX) 75 MG tablet TAKE ONE TABLET BY MOUTH ONCE DAILY (AM) 90 tablet 1   diclofenac Sodium (VOLTAREN) 1 % GEL APPLY 2 GRAMS TOPICALLY 4 (FOUR) TIMES DAILY. 100 g 2   donepezil (ARICEPT) 5 MG tablet TAKE 1 TABLET (5 MG TOTAL) BY MOUTH AT BEDTIME. 30 tablet 2   escitalopram (LEXAPRO) 10 MG tablet Take 1 tablet (10  mg total) by mouth daily. 90 tablet 1   JARDIANCE 10 MG TABS tablet TAKE 1 TABLET (10 MG TOTAL) BY MOUTH DAILY. (AM) 90 tablet 3   metoprolol succinate (TOPROL-XL) 50 MG 24 hr tablet TAKE 1 TABLET BY MOUTH DAILY. TAKE WITH OR IMMEDIATELY FOLLOWING A MEAL (AM) 90 tablet 1   nitroGLYCERIN (NITROSTAT) 0.4 MG SL tablet Place 1 tablet (0.4 mg total) under the tongue every 5 (five) minutes as needed for chest pain. 25 tablet 12   pantoprazole (PROTONIX) 40 MG tablet TAKE ONE TABLET BY MOUTH DAILY IN THE MORNING. 90 tablet 1   potassium chloride SA (KLOR-CON M) 20 MEQ tablet TAKE ONE TABLET BY MOUTH ONCE DAILY (AM) 90 tablet 1   rosuvastatin (CRESTOR) 20 MG tablet TAKE ONE TABLET BY MOUTH ONCE DAILY (BEDTIME) 90 tablet 1   Semaglutide (RYBELSUS) 3 MG TABS Take 1 tablet by  mouth daily. Take 30 minutes before breakfast 90 tablet 1   Vitamin D, Ergocalciferol, (DRISDOL) 1.25 MG (50000 UNIT) CAPS capsule TAKE 1 CAPSULE TWICE A WEEK ( MONDAY & THURSDAY) 24 capsule 1   No facility-administered medications prior to visit.     PAST MEDICAL HISTORY: Past Medical History:  Diagnosis Date   Angina    Anxiety    Bronchitis    Depression    Diabetes mellitus    Fibromyalgia    GERD (gastroesophageal reflux disease)    Headache(784.0)    Hyperlipidemia    Hypertension    Hypertensive heart disease without CHF    Lupus (HCC)    "treated for it from 1992 til 2012; dr said I don't have it anymore"   Obesity (BMI 30-39.9)    Osteoarthritis    Pneumonia    Shortness of breath    lying down, upon exertion   Shortness of breath on exertion    Stroke (HCC)    2019     PAST SURGICAL HISTORY: Past Surgical History:  Procedure Laterality Date   ABDOMINAL HYSTERECTOMY     partial   CARDIAC CATHETERIZATION  ~ 2007   CESAREAN SECTION  1978; 1981   COLONOSCOPY     CRANIOTOMY N/A 02/27/2019   Procedure: Endonasal Endoscopic biopsy of clival mass;  Surgeon: Jadene Pierini, MD;  Location: [facility] OR;   Service: Neurosurgery;  Laterality: N/A;  Endonasal Endoscopic biopsy of clival mass   ENDOSCOPIC TRANS NASAL APPROACH WITH FUSION N/A 02/27/2019   Procedure: ENDOSCOPIC TRANS NASAL APPROACH WITH FUSION;  Surgeon: Jadene Pierini, MD;  Location: [facility] OR;  Service: Neurosurgery;  Laterality: N/A;  ENDOSCOPIC TRANS NASAL APPROACH WITH FUSION   FOOT SURGERY     "had to cut it to let the fluids out; it had swollen very badly; left foot"     FAMILY HISTORY: Family History  Problem Relation Age of Onset   Heart attack Father    Diabetes Father    Hypertension Mother    Heart attack Brother    Kidney failure Brother    Diabetes Sister    Diabetes Sister      SOCIAL HISTORY: Social History   Socioeconomic History   Marital status: Married    Spouse name: Not on file   Number of children: 2   Years of education: Not on file   Highest education level: Not on file  Occupational History   Occupation: retired  Tobacco Use   Smoking status: Never   Smokeless tobacco: Never  Vaping Use   Vaping Use: Never used  Substance and Sexual Activity   Alcohol use: No    Alcohol/week: 0.0 standard drinks of alcohol   Drug use: No   Sexual activity: Yes    Partners: Male    Birth control/protection: Surgical  Other Topics Concern   Not on file  Social History Narrative   Married.  Husband is psychotherapist.  Several children.  Husband is Education officer, environmental of [employer] in [place].   Social Determinants of Health   Financial Resource Strain: Low Risk  (08/24/2021)   Overall Financial Resource Strain (CARDIA)    Difficulty of Paying Living Expenses: Not hard at all  Food Insecurity: No Food Insecurity (08/24/2021)   Hunger Vital Sign    Worried About Running Out of Food in the Last Year: Never true    Ran Out of Food in the Last Year: Never true  Transportation Needs: No Transportation Needs (08/24/2021)  PRAPARE - Administrator, Civil Service (Medical): No    Lack of  Transportation (Non-Medical): No  Physical Activity: Inactive (08/24/2021)   Exercise Vital Sign    Days of Exercise per Week: 0 days    Minutes of Exercise per Session: 0 min  Stress: No Stress Concern Present (08/24/2021)   [employer] of Occupational Health - Occupational Stress Questionnaire    Feeling of Stress : Not at all  Social Connections: Socially Integrated (03/09/2019)   Social Connection and Isolation Panel [NHANES]    Frequency of Communication with Friends and Family: More than three times a week    Frequency of Social Gatherings with Friends and Family: More than three times a week    Attends Religious Services: More than 4 times per year    Active Member of [employer] or Organizations: Yes    Attends Engineer, structural: More than 4 times per year    Marital Status: Married  Catering manager Violence: Not At Risk (03/09/2019)   Humiliation, Afraid, Rape, and Kick questionnaire    Fear of Current or Ex-Partner: No    Emotionally Abused: No    Physically Abused: No    Sexually Abused: No     PHYSICAL EXAM  Vitals:   09/13/22 1347  BP: (!) 144/66  Pulse: (!) 52   There is no height or weight on file to calculate BMI.  Generalized: Well developed, in no acute distress  Cardiology: normal rate and rhythm, no murmur auscultated  Respiratory: clear to auscultation bilaterally    Neurological examination  Mentation: Alert oriented to time, place, history taking. Follows all commands speech and language fluent Cranial nerve II-XII: Pupils were equal round reactive to light. Extraocular movements were full, visual field were full on confrontational test. Facial sensation and strength were normal. Uvula tongue midline. Head turning and shoulder shrug  were normal and symmetric. Motor: The motor testing reveals 5 over 5 strength of all 4 extremities. Good symmetric motor tone is noted throughout.  Sensory: Sensory testing is intact to soft touch on all 4  extremities. No evidence of extinction is noted.  Coordination: Cerebellar testing reveals good finger-nose-finger and heel-to-shin bilaterally.  Gait and station: Gait is normal. Tandem gait is normal. Romberg is negative. No drift is seen.  Reflexes: Deep tendon reflexes are symmetric and normal bilaterally.    DIAGNOSTIC DATA (LABS, IMAGING, TESTING) - I reviewed patient records, labs, notes, testing and imaging myself where available.  Lab Results  Component Value Date   WBC 5.3 06/05/2022   HGB 13.7 06/05/2022   HCT 43.0 06/05/2022   MCV 93 06/05/2022   PLT 287 06/05/2022      Component Value Date/Time   NA 142 06/05/2022 1530   K 4.7 06/05/2022 1530   CL 104 06/05/2022 1530   CO2 24 06/05/2022 1530   GLUCOSE 67 (L) 06/05/2022 1530   GLUCOSE 161 (H) 04/17/2019 1023   BUN 18 06/05/2022 1530   CREATININE 1.35 (H) 06/05/2022 1530   CALCIUM 10.2 06/05/2022 1530   PROT 7.2 06/05/2022 1530   ALBUMIN 4.4 06/05/2022 1530   AST 17 06/05/2022 1530   ALT 10 06/05/2022 1530   ALKPHOS 65 06/05/2022 1530   BILITOT 1.2 06/05/2022 1530   GFRNONAA 39 (L) 01/11/2020 1148   GFRAA 45 (L) 01/11/2020 1148   Lab Results  Component Value Date   CHOL 198 06/05/2022   HDL 76 06/05/2022   LDLCALC 107 (H) 06/05/2022   TRIG  82 06/05/2022   CHOLHDL 2.6 06/05/2022   Lab Results  Component Value Date   HGBA1C 6.4 (H) 06/05/2022   Lab Results  Component Value Date   VITAMINB12 343 06/27/2021   Lab Results  Component Value Date   TSH 0.964 06/27/2021       09/13/2022    2:03 PM 12/17/2019    9:28 AM 06/16/2019    1:26 PM  MMSE - Mini Mental State Exam  Orientation to time 1 4 4   Orientation to Place 4 4 5   Registration 3 3 3   Attention/ Calculation 0 5 5  Recall 0 1 0  Language- name 2 objects 2 2 2   Language- repeat 1 1 1   Language- follow 3 step command 3 3 2   Language- read & follow direction 1 0 0  Write a sentence 1 1 1   Copy design 1 1 1   Total score 17 25 24           No data to display           ASSESSMENT AND PLAN  71 y.o. year old female  has a past medical history of Angina, Anxiety, Bronchitis, Depression, Diabetes mellitus, Fibromyalgia, GERD (gastroesophageal reflux disease), Headache(784.0), Hyperlipidemia, Hypertension, Hypertensive heart disease without CHF, Lupus (HCC), Obesity (BMI 30-39.9), Osteoarthritis, Pneumonia, Shortness of breath, Shortness of breath on exertion, and Stroke (HCC). here with    Brain tumor (HCC) - Plan: Bridget BRAIN WO CONTRAST, CANCELED: Bridget BRAIN WO CONTRAST  Dementia with mood disturbance, unspecified dementia severity, unspecified dementia type (HCC) - Plan: Bridget BRAIN WO CONTRAST, CANCELED: Bridget BRAIN WO CONTRAST  Bridget Good presents with worsening short term memory loss and difficulty with recall. She continues to suffer with significant anxiety and depression. I have encouraged her son to continue discussion with PCP about mood management and consider psychiatry referral. I will trial her on a low dose of quetiapine as she is having significant anxiety at night. We have reviewed possible side effects with her and her family. She will continue donepezil 5mg  daily for now but I would like to consider increasing this to 10mg  in the future if well tolerated. I will place order for repeat imaging to evaluate previous concerns of C2 marrow space lesion found on previous MRI in 2021. May consider cervical imaging if needed. Healthy lifestyle habits encouraged. She will follow up with PCP as directed. She will return to see Dr Vickey Huger for evaluation and consideration of additional testing/work up for dementia. She verbalizes understanding and agreement with this plan.   Orders Placed This Encounter  Procedures   Bridget BRAIN WO CONTRAST    Standing Status:   Future    Standing Expiration Date:   09/14/2023    Order Specific Question:   What is the patient's sedation requirement?    Answer:   No Sedation    Order Specific  Question:   Does the patient have a pacemaker or implanted devices?    Answer:   No    Order Specific Question:   Preferred imaging location?    Answer:   External     Meds ordered this encounter  Medications   QUEtiapine (SEROQUEL) 25 MG tablet    Sig: Take 0.5 tablets (12.5 mg total) by mouth at bedtime.    Dispense:  45 tablet    Refill:  1    Order Specific Question:   Supervising Provider    Answer:   Anson Fret [id number]  Shawnie Dapper, MSN, FNP-C 09/13/2022, 4:35 PM  [facility] [address] [place], [place] [address] [phone]

## 2022-09-12 NOTE — Patient Instructions (Incomplete)
Below is our plan:  We will continue donepezil '5mg'$  at bedtime. We will add low dose of quetiapine 12.'5mg'$  at bedtime. Please monitor for any worsening mood changes or hallucinations. Talk with PCP regarding anxiety and maybe consider referral to psychiatry.   Please make sure you are staying well hydrated. I recommend 50-60 ounces daily. Well balanced diet and regular exercise encouraged. Consistent sleep schedule with 6-8 hours recommended.   Please continue follow up with care team as directed.   Follow up with Dr Brett Fairy in 3-4 months    You may receive a survey regarding today's visit. I encourage you to leave honest feed back as I do use this information to improve patient care. Thank you for seeing me today!   Management of Memory Problems   There are some general things you can do to help manage your memory problems.  Your memory may not in fact recover, but by using techniques and strategies you will be able to manage your memory difficulties better.   1)  Establish a routine. Try to establish and then stick to a regular routine.  By doing this, you will get used to what to expect and you will reduce the need to rely on your memory.  Also, try to do things at the same time of day, such as taking your medication or checking your calendar first thing in the morning. Think about think that you can do as a part of a regular routine and make a list.  Then enter them into a daily planner to remind you.  This will help you establish a routine.   2)  Organize your environment. Organize your environment so that it is uncluttered.  Decrease visual stimulation.  Place everyday items such as keys or cell phone in the same place every day (ie.  Basket next to front door) Use post it notes with a brief message to yourself (ie. Turn off light, lock the door) Use labels to indicate where things go (ie. Which cupboards are for food, dishes, etc.) Keep a notepad and pen by the telephone to take  messages   3)  Memory Aids A diary or journal/notebook/daily planner Making a list (shopping list, chore list, to do list that needs to be done) Using an alarm as a reminder (kitchen timer or cell phone alarm) Using cell phone to store information (Notes, Calendar, Reminders) Calendar/White board placed in a prominent position Post-it notes   In order for memory aids to be useful, you need to have good habits.  It's no good remembering to make a note in your journal if you don't remember to look in it.  Try setting aside a certain time of day to look in journal.   4)  Improving mood and managing fatigue. There may be other factors that contribute to memory difficulties.  Factors, such as anxiety, depression and tiredness can affect memory. Regular gentle exercise can help improve your mood and give you more energy. Exercise: there are short videos created by the [employer] on Health specially for older adults: https://bit.ly/2I30q97.  Mediterranean diet: which emphasizes fruits, vegetables, whole grains, legumes, fish, and other seafood; unsaturated fats such as olive oils; and low amounts of red meat, eggs, and sweets. A variation of this, called MIND ([place] Intervention for Neurodegenerative Delay) incorporates the DASH (Dietary Approaches to Stop Hypertension) diet, which has been shown to lower high blood pressure, a risk factor for Alzheimer's disease. More information at: RepublicForum.gl.  Aerobic exercise that improve  heart health is also good for the mind.  [employer] on Aging have short videos for exercises that you can do at home: GoldCloset.com.ee Simple relaxation techniques may help relieve symptoms of anxiety Try to get back to completing activities or hobbies you enjoyed doing in the past. Learn to pace yourself through activities to decrease fatigue. Find out about some  local support groups where you can share experiences with others. Try and achieve 7-8 hours of sleep at night.   Resources for Family/Caregiver  Online caregiver support groups can be found at LDLive.be or call Alzheimer's Association's 24/7 hotline: 438-298-8729. [place] [facility] offers in-person, virtual support groups and individual counseling for both care partners and persons with memory loss. Call for more information at 7065464356.   Advanced care plan: there are two types of Power of Attorney: healthcare and durable. Healthcare POA is a designated person to make healthcare decisions on your behalf if you were too sick to make them yourself. This person can be selected and documented by your physician. Durable POA has to be set up with a lawyer who takes charge of your finances and estate if you were too sick or cognitively impaired to manage your finances accurately. You can find a local Elder Engineer, mining here: ToyShower.it.  Check out www.planyourlifespan.org, which will help you plan before a crisis and decide who will take care of life considerations in a circumstance where you may not be able to speak for yourself.   Helpful books (available on [employer] or your local bookstore):  By Dr. Army Chaco: Keeping Love Alive as Memories Fade: The 5 Love Languages and the Alzheimer's Journey Sep 10, 2015 The Dementia Care Partner's Workbook: A Guide for Understanding, Education, and [employer] - May 10, 2018.  Both available for less than $15.   "Coping with behavior change in dementia: a family caregiver's guide" by [place] "A Caregiver's Guide to Dementia: Using Activities and Other Strategies to Prevent, Reduce and Manage Behavioral Symptoms" by Osie Bond Gitlin and [employer].  Arts development officer of Joy for the Person with Alzheimer's or Dementia" 4th edition by Vance Peper  Caregiver videos on common behaviors related to dementia:  quierodirigir.com  [place] Caregiver Portal: free to sign up, links to local resources: [email or web address]://[email or web address]-caregivers.com/login

## 2022-09-13 ENCOUNTER — Encounter: Payer: Self-pay | Admitting: Family Medicine

## 2022-09-13 ENCOUNTER — Ambulatory Visit (INDEPENDENT_AMBULATORY_CARE_PROVIDER_SITE_OTHER): Payer: Medicare Other | Admitting: Family Medicine

## 2022-09-13 VITALS — BP 144/66 | HR 52

## 2022-09-13 DIAGNOSIS — D496 Neoplasm of unspecified behavior of brain: Secondary | ICD-10-CM | POA: Diagnosis not present

## 2022-09-13 DIAGNOSIS — F0393 Unspecified dementia, unspecified severity, with mood disturbance: Secondary | ICD-10-CM

## 2022-09-13 MED ORDER — QUETIAPINE FUMARATE 25 MG PO TABS: 12.5000 mg | ORAL_TABLET | Freq: Every day | ORAL | 1 refills | Status: DC

## 2022-09-14 ENCOUNTER — Telehealth: Payer: Self-pay | Admitting: Family Medicine

## 2022-09-14 NOTE — Telephone Encounter (Signed)
[employer] medicare NPR sent to [facility] [phone] 

## 2022-09-20 ENCOUNTER — Telehealth: Payer: Self-pay

## 2022-09-20 ENCOUNTER — Ambulatory Visit: Payer: Medicare HMO | Admitting: Nurse Practitioner

## 2022-09-20 ENCOUNTER — Ambulatory Visit: Payer: Medicare HMO

## 2022-09-20 NOTE — Telephone Encounter (Signed)
This nurse called patient for telephonic AWV. Patient's daughter stated that they have had a death in the family. She will call back to reschedule for another time.

## 2022-09-26 ENCOUNTER — Ambulatory Visit (INDEPENDENT_AMBULATORY_CARE_PROVIDER_SITE_OTHER): Payer: Medicare Other

## 2022-09-26 VITALS — BP 122/70 | HR 56 | Temp 98.7°F | Ht 65.0 in | Wt 164.6 lb

## 2022-09-26 DIAGNOSIS — Z0000 Encounter for general adult medical examination without abnormal findings: Secondary | ICD-10-CM

## 2022-09-26 DIAGNOSIS — Z23 Encounter for immunization: Secondary | ICD-10-CM | POA: Diagnosis not present

## 2022-09-26 NOTE — Patient Instructions (Signed)
Bridget Good , Thank you for taking time to come for your Medicare Wellness Visit. I appreciate your ongoing commitment to your health goals. Please review the following plan we discussed and let me know if I can assist you in the future.   Screening recommendations/referrals: Colonoscopy: completed 03/30/2015 Mammogram: scheduled 12/12/2022 Bone Density: scheduled 12/12/2022 Recommended yearly ophthalmology/optometry visit for glaucoma screening and checkup Recommended yearly dental visit for hygiene and checkup  Vaccinations: Influenza vaccine: today Pneumococcal vaccine: completed 08/08/2022 Tdap vaccine: completed 06/05/2022, due 06/05/2032 Shingles vaccine: discussed   Covid-19: 08/23/2020, 03/15/2020, 02/01/2020  Advanced directives: Advance directive discussed with you today. Even though you declined this today please call our office should you change your mind and we can give you the proper paperwork for you to fill out.  Conditions/risks identified: none  Next appointment: Follow up in one year for your annual wellness visit    Preventive Care 65 Years and Older, Female Preventive care refers to lifestyle choices and visits with your health care provider that can promote health and wellness. What does preventive care include? A yearly physical exam. This is also called an annual well check. Dental exams once or twice a year. Routine eye exams. Ask your health care provider how often you should have your eyes checked. Personal lifestyle choices, including: Daily care of your teeth and gums. Regular physical activity. Eating a healthy diet. Avoiding tobacco and drug use. Limiting alcohol use. Practicing safe sex. Taking low-dose aspirin every day. Taking vitamin and mineral supplements as recommended by your health care provider. What happens during an annual well check? The services and screenings done by your health care provider during your annual well check will depend on your  age, overall health, lifestyle risk factors, and family history of disease. Counseling  Your health care provider may ask you questions about your: Alcohol use. Tobacco use. Drug use. Emotional well-being. Home and relationship well-being. Sexual activity. Eating habits. History of falls. Memory and ability to understand (cognition). Work and work Statistician. Reproductive health. Screening  You may have the following tests or measurements: Height, weight, and BMI. Blood pressure. Lipid and cholesterol levels. These may be checked every 5 years, or more frequently if you are over 10 years old. Skin check. Lung cancer screening. You may have this screening every year starting at age 67 if you have a 30-pack-year history of smoking and currently smoke or have quit within the past 15 years. Fecal occult blood test (FOBT) of the stool. You may have this test every year starting at age 62. Flexible sigmoidoscopy or colonoscopy. You may have a sigmoidoscopy every 5 years or a colonoscopy every 10 years starting at age 58. Hepatitis C blood test. Hepatitis B blood test. Sexually transmitted disease (STD) testing. Diabetes screening. This is done by checking your blood sugar (glucose) after you have not eaten for a while (fasting). You may have this done every 1-3 years. Bone density scan. This is done to screen for osteoporosis. You may have this done starting at age 80. Mammogram. This may be done every 1-2 years. Talk to your health care provider about how often you should have regular mammograms. Talk with your health care provider about your test results, treatment options, and if necessary, the need for more tests. Vaccines  Your health care provider may recommend certain vaccines, such as: Influenza vaccine. This is recommended every year. Tetanus, diphtheria, and acellular pertussis (Tdap, Td) vaccine. You may need a Td booster every 10 years. Zoster  vaccine. You may need this after  age 12. Pneumococcal 13-valent conjugate (PCV13) vaccine. One dose is recommended after age 51. Pneumococcal polysaccharide (PPSV23) vaccine. One dose is recommended after age 47. Talk to your health care provider about which screenings and vaccines you need and how often you need them. This information is not intended to replace advice given to you by your health care provider. Make sure you discuss any questions you have with your health care provider. Document Released: 12/23/2015 Document Revised: 08/15/2016 Document Reviewed: 09/27/2015 Elsevier Interactive Patient Education  2017 [employer] Prevention in the Home Falls can cause injuries. They can happen to people of all ages. There are many things you can do to make your home safe and to help prevent falls. What can I do on the outside of my home? Regularly fix the edges of walkways and driveways and fix any cracks. Remove anything that might make you trip as you walk through a door, such as a raised step or threshold. Trim any bushes or trees on the path to your home. Use bright outdoor lighting. Clear any walking paths of anything that might make someone trip, such as rocks or tools. Regularly check to see if handrails are loose or broken. Make sure that both sides of any steps have handrails. Any raised decks and porches should have guardrails on the edges. Have any leaves, snow, or ice cleared regularly. Use sand or salt on walking paths during winter. Clean up any spills in your garage right away. This includes oil or grease spills. What can I do in the bathroom? Use night lights. Install grab bars by the toilet and in the tub and shower. Do not use towel bars as grab bars. Use non-skid mats or decals in the tub or shower. If you need to sit down in the shower, use a plastic, non-slip stool. Keep the floor dry. Clean up any water that spills on the floor as soon as it happens. Remove soap buildup in the tub or shower  regularly. Attach bath mats securely with double-sided non-slip rug tape. Do not have throw rugs and other things on the floor that can make you trip. What can I do in the bedroom? Use night lights. Make sure that you have a light by your bed that is easy to reach. Do not use any sheets or blankets that are too big for your bed. They should not hang down onto the floor. Have a firm chair that has side arms. You can use this for support while you get dressed. Do not have throw rugs and other things on the floor that can make you trip. What can I do in the kitchen? Clean up any spills right away. Avoid walking on wet floors. Keep items that you use a lot in easy-to-reach places. If you need to reach something above you, use a strong step stool that has a grab bar. Keep electrical cords out of the way. Do not use floor polish or wax that makes floors slippery. If you must use wax, use non-skid floor wax. Do not have throw rugs and other things on the floor that can make you trip. What can I do with my stairs? Do not leave any items on the stairs. Make sure that there are handrails on both sides of the stairs and use them. Fix handrails that are broken or loose. Make sure that handrails are as long as the stairways. Check any carpeting to make sure that it  is firmly attached to the stairs. Fix any carpet that is loose or worn. Avoid having throw rugs at the top or bottom of the stairs. If you do have throw rugs, attach them to the floor with carpet tape. Make sure that you have a light switch at the top of the stairs and the bottom of the stairs. If you do not have them, ask someone to add them for you. What else can I do to help prevent falls? Wear shoes that: Do not have high heels. Have rubber bottoms. Are comfortable and fit you well. Are closed at the toe. Do not wear sandals. If you use a stepladder: Make sure that it is fully opened. Do not climb a closed stepladder. Make sure that  both sides of the stepladder are locked into place. Ask someone to hold it for you, if possible. Clearly mark and make sure that you can see: Any grab bars or handrails. First and last steps. Where the edge of each step is. Use tools that help you move around (mobility aids) if they are needed. These include: Canes. Walkers. Scooters. Crutches. Turn on the lights when you go into a dark area. Replace any light bulbs as soon as they burn out. Set up your furniture so you have a clear path. Avoid moving your furniture around. If any of your floors are uneven, fix them. If there are any pets around you, be aware of where they are. Review your medicines with your doctor. Some medicines can make you feel dizzy. This can increase your chance of falling. Ask your doctor what other things that you can do to help prevent falls. This information is not intended to replace advice given to you by your health care provider. Make sure you discuss any questions you have with your health care provider. Document Released: 09/22/2009 Document Revised: 05/03/2016 Document Reviewed: 12/31/2014 Elsevier Interactive Patient Education  2017 [employer].

## 2022-09-26 NOTE — Progress Notes (Signed)
Subjective:   Bridget Good is a 71 y.o. female who presents for Medicare Annual (Subsequent) preventive examination.  Review of Systems     Cardiac Risk Factors include: advanced age (>32mn, >>20women);diabetes mellitus;dyslipidemia;hypertension     Objective:    Today's Vitals   09/26/22 1132  BP: 122/70  Pulse: (!) 56  Temp: 98.7 F (37.1 C)  TempSrc: Oral  SpO2: 95%  Weight: 164 lb 9.6 oz (74.7 kg)  Height: '5\' 5"'$  (1.651 m)   Body mass index is 27.39 kg/m.     09/26/2022   11:46 AM 08/24/2021    4:11 PM 06/14/2020   12:10 AM 10/07/2019   10:01 AM 04/17/2019   10:52 AM 03/09/2019   10:51 AM 02/27/2019   12:00 PM  Advanced Directives  Does Patient Have a Medical Advance Directive? No No No Yes No Yes Yes  Type of ATeacher, early years/preLiving will [place] will  Does patient want to make changes to medical advance directive?    Yes (MAU/Ambulatory/Procedural Areas - Information given)   No - Patient declined  Copy of [place]?      No - copy requested No - copy requested  Would patient like information on creating a medical advance directive? No - Patient declined No - Patient declined Yes (ED - send information to MyChart)        Current Medications (verified) Outpatient Encounter Medications as of 09/26/2022  Medication Sig   acetaminophen (TYLENOL) 500 MG tablet Take 1 tablet (500 mg total) by mouth as needed for mild pain.   amLODipine-benazepril (LOTREL) 10-40 MG capsule TAKE 1 CAPSULE BY MOUTH DAILY.(AM)   clopidogrel (PLAVIX) 75 MG tablet TAKE ONE TABLET BY MOUTH ONCE DAILY (AM)   diclofenac Sodium (VOLTAREN) 1 % GEL APPLY 2 GRAMS TOPICALLY 4 (FOUR) TIMES DAILY.   donepezil (ARICEPT) 5 MG tablet TAKE 1 TABLET (5 MG TOTAL) BY MOUTH AT BEDTIME.   escitalopram (LEXAPRO) 10 MG tablet Take 1 tablet (10 mg total) by mouth daily.   JARDIANCE 10 MG TABS tablet TAKE 1 TABLET (10 MG  TOTAL) BY MOUTH DAILY. (AM)   metoprolol succinate (TOPROL-XL) 50 MG 24 hr tablet TAKE 1 TABLET BY MOUTH DAILY. TAKE WITH OR IMMEDIATELY FOLLOWING A MEAL (AM)   nitroGLYCERIN (NITROSTAT) 0.4 MG SL tablet Place 1 tablet (0.4 mg total) under the tongue every 5 (five) minutes as needed for chest pain.   pantoprazole (PROTONIX) 40 MG tablet TAKE ONE TABLET BY MOUTH DAILY IN THE MORNING.   potassium chloride SA (KLOR-CON M) 20 MEQ tablet TAKE ONE TABLET BY MOUTH ONCE DAILY (AM)   QUEtiapine (SEROQUEL) 25 MG tablet Take 0.5 tablets (12.5 mg total) by mouth at bedtime.   rosuvastatin (CRESTOR) 20 MG tablet TAKE ONE TABLET BY MOUTH ONCE DAILY (BEDTIME)   Semaglutide (RYBELSUS) 3 MG TABS Take 1 tablet by mouth daily. Take 30 minutes before breakfast   Vitamin D, Ergocalciferol, (DRISDOL) 1.25 MG (50000 UNIT) CAPS capsule TAKE 1 CAPSULE TWICE A WEEK ( MONDAY & THURSDAY)   ASPIRIN LOW DOSE 81 MG tablet TAKE ONE TABLET BY MOUTH ONCE DAILY (AM) (Patient not taking: Reported on 09/26/2022)   No facility-administered encounter medications on file as of 09/26/2022.    Allergies (verified) Sulfa antibiotics   History: Past Medical History:  Diagnosis Date   Angina    Anxiety    Bronchitis    Depression  Diabetes mellitus    Fibromyalgia    GERD (gastroesophageal reflux disease)    Headache(784.0)    Hyperlipidemia    Hypertension    Hypertensive heart disease without CHF    Lupus ([place])    "treated for it from 1992 til 2012; dr said I don't have it anymore"   Obesity (BMI 30-39.9)    Osteoarthritis    Pneumonia    Shortness of breath    lying down, upon exertion   Shortness of breath on exertion    Stroke ([place])    2019   Past Surgical History:  Procedure Laterality Date   ABDOMINAL HYSTERECTOMY     partial   CARDIAC CATHETERIZATION  ~ 2007   CESAREAN SECTION  1978; 1981   COLONOSCOPY     CRANIOTOMY N/A 02/27/2019   Procedure: Endonasal Endoscopic biopsy of clival mass;  Surgeon:  Judith Part, MD;  Location: [facility];  Service: Neurosurgery;  Laterality: N/A;  Endonasal Endoscopic biopsy of clival mass   ENDOSCOPIC TRANS NASAL APPROACH WITH FUSION N/A 02/27/2019   Procedure: ENDOSCOPIC TRANS NASAL APPROACH WITH FUSION;  Surgeon: Judith Part, MD;  Location: [facility];  Service: Neurosurgery;  Laterality: N/A;  ENDOSCOPIC TRANS NASAL APPROACH WITH FUSION   FOOT SURGERY     "had to cut it to let the fluids out; it had swollen very badly; left foot"   Family History  Problem Relation Age of Onset   Heart attack Father    Diabetes Father    Hypertension Mother    Heart attack Brother    Kidney failure Brother    Diabetes Sister    Diabetes Sister    Social History   Socioeconomic History   Marital status: Married    Spouse name: Not on file   Number of children: 2   Years of education: Not on file   Highest education level: Not on file  Occupational History   Occupation: retired  Tobacco Use   Smoking status: Never   Smokeless tobacco: Never  Vaping Use   Vaping Use: Never used  Substance and Sexual Activity   Alcohol use: No    Alcohol/week: 0.0 standard drinks of alcohol   Drug use: No   Sexual activity: Yes    Partners: Male    Birth control/protection: Surgical  Other Topics Concern   Not on file  Social History Narrative   Married.  Husband is psychotherapist.  Several children.  Husband is Theme park manager of [employer] in [place].   Social Determinants of Health   Financial Resource Strain: Low Risk  (09/26/2022)   Overall Financial Resource Strain (CARDIA)    Difficulty of Paying Living Expenses: Not hard at all  Food Insecurity: No Food Insecurity (09/26/2022)   Hunger Vital Sign    Worried About Running Out of Food in the Last Year: Never true    Ran Out of Food in the Last Year: Never true  Transportation Needs: No Transportation Needs (09/26/2022)   PRAPARE - Hydrologist (Medical): No    Lack of  Transportation (Non-Medical): No  Physical Activity: Inactive (09/26/2022)   Exercise Vital Sign    Days of Exercise per Week: 0 days    Minutes of Exercise per Session: 0 min  Stress: No Stress Concern Present (09/26/2022)   [place]    Feeling of Stress : Not at all  Social Connections: [place] (03/09/2019)  Social Licensed conveyancer [NHANES]    Frequency of Communication with Friends and Family: More than three times a week    Frequency of Social Gatherings with Friends and Family: More than three times a week    Attends Religious Services: More than 4 times per year    Active Member of [employer] or Organizations: Yes    Attends Music therapist: More than 4 times per year    Marital Status: Married    Tobacco Counseling Counseling given: Not Answered   Clinical Intake:  Pre-visit preparation completed: Yes  Pain : No/denies pain     Nutritional Status: BMI 25 -29 Overweight Nutritional Risks: None Diabetes: Yes  How often do you need to have someone help you when you read instructions, pamphlets, or other written materials from your doctor or pharmacy?: 3 - Sometimes  Diabetic? Yes Nutrition Risk Assessment:  Has the patient had any N/V/D within the last 2 months?  No  Does the patient have any non-healing wounds?  No  Has the patient had any unintentional weight loss or weight gain?  No   Diabetes:  Is the patient diabetic?  Yes  If diabetic, was a CBG obtained today?  No  Did the patient bring in their glucometer from home?  No  How often do you monitor your CBG's? sometimes.   Financial Strains and Diabetes Management:  Are you having any financial strains with the device, your supplies or your medication? No .  Does the patient want to be seen by Chronic Care Management for management of their diabetes?  No  Would the patient like to be referred to a  Nutritionist or for Diabetic Management?  No   Diabetic Exams:  Diabetic Eye Exam: Overdue for diabetic eye exam. Pt has been advised about the importance in completing this exam. Patient advised to call and schedule an eye exam. Diabetic Foot Exam: Completed 12/19/2021   Interpreter Needed?: No  Information entered by :: NAllen LPN   Activities of Daily Living    09/26/2022   11:47 AM  In your present state of health, do you have any difficulty performing the following activities:  Hearing? 0  Vision? 0  Difficulty concentrating or making decisions? 1  Walking or climbing stairs? 0  Dressing or bathing? 0  Doing errands, shopping? 1  Preparing Food and eating ? Y  Using the Toilet? N  In the past six months, have you accidently leaked urine? N  Do you have problems with loss of bowel control? N  Managing your Medications? Y  Managing your Finances? Y  Housekeeping or managing your Housekeeping? N    Patient Care Team: [name], FNP as PCP - General (General Practice) Nahser, Wonda Cheng, MD as PCP - Cardiology (Cardiology) Jacolyn Reedy, MD as Consulting Physician (Cardiology)  Indicate any recent Medical Services you may have received from other than [facility] providers in the past year (date may be approximate).     Assessment:   This is a routine wellness examination for Nija.  Hearing/Vision screen Vision Screening - Comments:: No regular eye exams, [facility]  Dietary issues and exercise activities discussed: Current Exercise Habits: The patient does not participate in regular exercise at present   Goals Addressed             This Visit's Progress    Patient Stated       09/26/2022, wants to eat healthier       Depression  Screen    09/26/2022   11:47 AM 06/05/2022    2:12 PM 08/24/2021    4:12 PM 06/27/2021    3:34 PM 05/11/2020   10:48 AM 01/11/2020   10:49 AM 10/07/2019   10:02 AM  PHQ 2/9 Scores  PHQ - 2 Score 0 4 0 1 0 0 2  PHQ- 9  Score  '7  6 1 '$ 0 3    Fall Risk    09/26/2022   11:47 AM 08/08/2022   12:03 PM 08/24/2021    4:12 PM 06/27/2021    3:33 PM 01/11/2020   10:49 AM  Fall Risk   Falls in the past year? 0 1 0 0 0  Number falls in past yr: 0 1  0   Injury with Fall? 0 0  0   Risk for fall due to : Medication side effect;Impaired balance/gait No Fall Risks;History of fall(s) Medication side effect    Follow up Falls prevention discussed;Education provided;Falls evaluation completed Falls evaluation completed Falls evaluation completed;Education provided;Falls prevention discussed      FALL RISK PREVENTION PERTAINING TO THE HOME:  Any stairs in or around the home? Yes  If so, are there any without handrails? No  Home free of loose throw rugs in walkways, pet beds, electrical cords, etc? Yes  Adequate lighting in your home to reduce risk of falls? Yes   ASSISTIVE DEVICES UTILIZED TO PREVENT FALLS:  Life alert? No  Use of a cane, walker or w/c? Yes  Grab bars in the bathroom? Yes  Shower chair or bench in shower? Yes  Elevated toilet seat or a handicapped toilet? No   TIMED UP AND GO:  Was the test performed? Yes .  Length of time to ambulate 10 feet: 6 sec.   Gait slow and steady with assistive device  Cognitive Function:    09/13/2022    2:03 PM 12/17/2019    9:28 AM 06/16/2019    1:26 PM  MMSE - Mini Mental State Exam  Orientation to time '1 4 4  '$ Orientation to Place '4 4 5  '$ Registration '3 3 3  '$ Attention/ Calculation 0 5 5  Recall 0 1 0  Language- name 2 objects '2 2 2  '$ Language- repeat '1 1 1  '$ Language- follow 3 step command '3 3 2  '$ Language- read & follow direction 1 0 0  Write a sentence '1 1 1  '$ Copy design '1 1 1  '$ Total score '17 25 24        '$ 06/27/2021    3:35 PM 10/07/2019   10:11 AM  6CIT Screen  What Year? 0 points 0 points  What month? 0 points 0 points  What time? 0 points 0 points  Count back from 20 4 points 0 points  Months in reverse 4 points 0 points  Repeat phrase 10  points 6 points  Total Score 18 points 6 points    Immunizations Immunization History  Administered Date(s) Administered   Fluad Quad(high Dose 65+) 08/24/2021, 09/26/2022   Influenza Inj Mdck Quad Pf 01/02/2018   Influenza, High Dose Seasonal PF 09/19/2018   Influenza,inj,Quad PF,6+ Mos 12/06/2016   Moderna Sars-Covid-2 Vaccination 02/01/2020, 03/15/2020, 08/23/2020   PNEUMOCOCCAL CONJUGATE-20 08/08/2022   Pneumococcal Polysaccharide-23 11/26/2018   Tdap 06/05/2022    TDAP status: Up to date  Flu Vaccine status: Completed at today's visit  Pneumococcal vaccine status: Up to date  Covid-19 vaccine status: Completed vaccines  Qualifies for Shingles Vaccine? Yes   Zostavax completed  No   Shingrix Completed?: No.    Education has been provided regarding the importance of this vaccine. Patient has been advised to call insurance company to determine out of pocket expense if they have not yet received this vaccine. Advised may also receive vaccine at local pharmacy or Health Dept. Verbalized acceptance and understanding.  Screening Tests Health Maintenance  Topic Date Due   Zoster Vaccines- Shingrix (1 of 2) Never done   DEXA SCAN  Never done   OPHTHALMOLOGY EXAM  06/26/2019   COVID-19 Vaccine (4 - Moderna risk series) 10/18/2020   MAMMOGRAM  01/30/2021   HEMOGLOBIN A1C  12/05/2022   FOOT EXAM  12/19/2022   Diabetic kidney evaluation - GFR measurement  06/06/2023   Diabetic kidney evaluation - Urine ACR  06/06/2023   COLONOSCOPY (Pts 45-65yr Insurance coverage will need to be confirmed)  03/29/2025   TETANUS/TDAP  06/05/2032   Pneumonia Vaccine 71 Years old  Completed   INFLUENZA VACCINE  Completed   Hepatitis C Screening  Completed   HPV VACCINES  Aged Out    Health Maintenance  Health Maintenance Due  Topic Date Due   Zoster Vaccines- Shingrix (1 of 2) Never done   DEXA SCAN  Never done   OPHTHALMOLOGY EXAM  06/26/2019   COVID-19 Vaccine (4 - Moderna risk series)  10/18/2020   MAMMOGRAM  01/30/2021    Colorectal cancer screening: Type of screening: Colonoscopy. Completed 03/30/2015. Repeat every 10 years  Mammogram status: scheduled 12/12/2022  Bone Density status: scheduled 12/12/2022  Lung Cancer Screening: (Low Dose CT Chest recommended if Age 69-80years, 30 pack-year currently smoking OR have quit w/in 15years.) does not qualify.   Lung Cancer Screening Referral: no  Additional Screening:  Hepatitis C Screening: does qualify; Completed 05/14/2019  Vision Screening: Recommended annual ophthalmology exams for early detection of glaucoma and other disorders of the eye. Is the patient up to date with their annual eye exam?  No  Who is the provider or what is the name of the office in which the patient attends annual eye exams[facility] If pt is not established with a provider, would they like to be referred to a provider to establish care? No .   Dental Screening: Recommended annual dental exams for proper oral hygiene  Community Resource Referral / Chronic Care Management: CRR required this visit?  No   CCM required this visit?  No      Plan:     I have personally reviewed and noted the following in the patient's chart:   Medical and social history Use of alcohol, tobacco or illicit drugs  Current medications and supplements including opioid prescriptions. Patient is not currently taking opioid prescriptions. Functional ability and status Nutritional status Physical activity Advanced directives List of other physicians Hospitalizations, surgeries, and ER visits in previous 12 months Vitals Screenings to include cognitive, depression, and falls Referrals and appointments  In addition, I have reviewed and discussed with patient certain preventive protocols, quality metrics, and best practice recommendations. A written personalized care plan for preventive services as well as general preventive health recommendations were  provided to patient.     NKellie Simmering LPN   [id number]  Nurse Notes: 6 CIT not administered. Patient has diagnosis of cognitive impairment. Followed by neurology.

## 2022-10-03 ENCOUNTER — Ambulatory Visit
Admission: RE | Admit: 2022-10-03 | Discharge: 2022-10-03 | Disposition: A | Discharge status: Home / Self Care | Payer: Medicare Other | Source: Ambulatory Visit | Attending: Family Medicine | Admitting: Family Medicine

## 2022-10-03 DIAGNOSIS — D469 Myelodysplastic syndrome, unspecified: Secondary | ICD-10-CM | POA: Diagnosis not present

## 2022-10-03 DIAGNOSIS — D496 Neoplasm of unspecified behavior of brain: Secondary | ICD-10-CM

## 2022-10-03 DIAGNOSIS — F0393 Unspecified dementia, unspecified severity, with mood disturbance: Secondary | ICD-10-CM

## 2022-10-04 ENCOUNTER — Encounter: Payer: Self-pay | Admitting: Neurology

## 2022-10-08 NOTE — Telephone Encounter (Signed)
Called the patient's daughter ((on [place]) reviewed the results with her. Pt verbalized understanding. Pt had no questions at this time but was encouraged to call back if questions arise.

## 2022-10-09 ENCOUNTER — Ambulatory Visit: Payer: Medicare (Managed Care) | Admitting: Nurse Practitioner

## 2022-10-18 ENCOUNTER — Other Ambulatory Visit: Payer: Self-pay

## 2022-10-23 DIAGNOSIS — H35033 Hypertensive retinopathy, bilateral: Secondary | ICD-10-CM | POA: Diagnosis not present

## 2022-10-23 DIAGNOSIS — H25813 Combined forms of age-related cataract, bilateral: Secondary | ICD-10-CM | POA: Diagnosis not present

## 2022-10-26 ENCOUNTER — Encounter: Payer: Self-pay | Admitting: Podiatry

## 2022-10-26 ENCOUNTER — Ambulatory Visit (INDEPENDENT_AMBULATORY_CARE_PROVIDER_SITE_OTHER): Payer: Medicare Other | Admitting: Podiatry

## 2022-10-26 DIAGNOSIS — M79675 Pain in left toe(s): Secondary | ICD-10-CM | POA: Diagnosis not present

## 2022-10-26 DIAGNOSIS — E119 Type 2 diabetes mellitus without complications: Secondary | ICD-10-CM | POA: Diagnosis not present

## 2022-10-26 DIAGNOSIS — M79674 Pain in right toe(s): Secondary | ICD-10-CM | POA: Diagnosis not present

## 2022-10-26 DIAGNOSIS — D689 Coagulation defect, unspecified: Secondary | ICD-10-CM

## 2022-10-26 DIAGNOSIS — B351 Tinea unguium: Secondary | ICD-10-CM

## 2022-10-26 NOTE — Progress Notes (Signed)
This patient returns to my office for at risk foot care.  This patient requires this care by a professional since this patient will be at risk due to having coagulation defect due to plavix.  This patient is unable to cut nails himself since the patient cannot reach his nails.These nails are painful walking and wearing shoes.  This patient presents for at risk foot care today.  General Appearance  Alert, conversant and in no acute stress.  Vascular  Dorsalis pedis and posterior tibial  pulses are palpable  bilaterally.  Capillary return is within normal limits  bilaterally. Temperature is within normal limits  bilaterally.  Neurologic  Senn-Weinstein monofilament wire test within normal limits  bilaterally. Muscle power within normal limits bilaterally.  Nails Thick disfigured discolored nails with subungual debris  from hallux to fifth toes right foot.  Thick mycotic hallux toenail left foot. No evidence of bacterial infection or drainage bilaterally.  Orthopedic  No limitations of motion  feet .  No crepitus or effusions noted.  No bony pathology or digital deformities noted.  Skin  normotropic skin with no porokeratosis noted bilaterally.  No signs of infections or ulcers noted.     Onychomycosis  Pain in right toes  Pain in left toes  Consent was obtained for treatment procedures.   Mechanical debridement of nails 1-5  bilaterally performed with a nail nipper.  Filed with dremel without incident.    Return office visit   3 months                   Told patient to return for periodic foot care and evaluation due to potential at risk complications.   Gardiner Barefoot DPM

## 2022-11-05 ENCOUNTER — Other Ambulatory Visit: Payer: Self-pay | Admitting: Nurse Practitioner

## 2022-11-27 ENCOUNTER — Encounter: Payer: Medicare HMO | Admitting: Nurse Practitioner

## 2022-11-27 NOTE — Progress Notes (Signed)
Not seen

## 2022-11-28 ENCOUNTER — Telehealth: Payer: Self-pay

## 2022-11-28 NOTE — Chronic Care Management (AMB) (Signed)
  Bridget Good son was reminded to have all medications, supplements and any blood glucose and blood pressure readings available for review with [name], Pharm. D, at her telephone visit on 11-30-2022 at 10:00.   Questions: Have you had any recent office visit or specialist visit outside of [facility]? Patient's son stated no  Are there any concerns you would like to discuss during your office visit? Patient's son stated no  Are you having any problems obtaining your medications? (Whether it pharmacy issues or cost) Patient's son stated no  If patient has any PAP medications ask if they are having any problems getting their PAP medication or refill? No PAP meds  Care Gaps: Shingrix overdue Covid booster overdue Ophthalmology exam overdue Mammogram overdue Dexa scan overdue  Star Rating Drug: Lotrel 10-40 mg- Last filled 11-05-2022 30 DS. Previous 08-23-2022 30 DS [facility] Rosuvastatin 20 mg- Last filled 11-05-2022 30 DS. Previous 08-23-2022 30 DS [facility] Rybelsus 7 mg- Samples  Jardiance 10 mg- Samples  Any gaps in medications fill history? No  [place] Pharmacist Assistant [phone]

## 2022-11-30 ENCOUNTER — Telehealth: Payer: Self-pay

## 2022-12-07 ENCOUNTER — Other Ambulatory Visit: Payer: Self-pay | Admitting: Nurse Practitioner

## 2022-12-07 DIAGNOSIS — F330 Major depressive disorder, recurrent, mild: Secondary | ICD-10-CM

## 2022-12-12 ENCOUNTER — Ambulatory Visit
Admission: RE | Admit: 2022-12-12 | Discharge: 2022-12-12 | Disposition: A | Discharge status: Home / Self Care | Payer: Medicare Other | Source: Ambulatory Visit | Attending: Nurse Practitioner | Admitting: Nurse Practitioner

## 2022-12-12 DIAGNOSIS — Z1231 Encounter for screening mammogram for malignant neoplasm of breast: Secondary | ICD-10-CM | POA: Diagnosis not present

## 2022-12-12 DIAGNOSIS — E2839 Other primary ovarian failure: Secondary | ICD-10-CM

## 2022-12-12 DIAGNOSIS — M85851 Other specified disorders of bone density and structure, right thigh: Secondary | ICD-10-CM | POA: Diagnosis not present

## 2022-12-12 DIAGNOSIS — Z780 Asymptomatic menopausal state: Secondary | ICD-10-CM | POA: Diagnosis not present

## 2022-12-13 NOTE — Progress Notes (Signed)
Thank you for the update, we will get her to hematology.

## 2022-12-24 ENCOUNTER — Encounter: Payer: Self-pay | Admitting: Family Medicine

## 2022-12-25 ENCOUNTER — Encounter: Payer: Self-pay | Admitting: Nurse Practitioner

## 2022-12-25 ENCOUNTER — Ambulatory Visit (INDEPENDENT_AMBULATORY_CARE_PROVIDER_SITE_OTHER): Payer: Medicare Other | Admitting: Nurse Practitioner

## 2022-12-25 VITALS — BP 118/88 | HR 54 | Temp 98.5°F | Ht 65.0 in | Wt 165.0 lb

## 2022-12-25 DIAGNOSIS — I10 Essential (primary) hypertension: Secondary | ICD-10-CM | POA: Diagnosis not present

## 2022-12-25 DIAGNOSIS — E782 Mixed hyperlipidemia: Secondary | ICD-10-CM

## 2022-12-25 DIAGNOSIS — Z23 Encounter for immunization: Secondary | ICD-10-CM

## 2022-12-25 DIAGNOSIS — G300 Alzheimer's disease with early onset: Secondary | ICD-10-CM

## 2022-12-25 DIAGNOSIS — F02B4 Dementia in other diseases classified elsewhere, moderate, with anxiety: Secondary | ICD-10-CM

## 2022-12-25 DIAGNOSIS — Z111 Encounter for screening for respiratory tuberculosis: Secondary | ICD-10-CM

## 2022-12-25 DIAGNOSIS — E785 Hyperlipidemia, unspecified: Secondary | ICD-10-CM

## 2022-12-25 DIAGNOSIS — E1169 Type 2 diabetes mellitus with other specified complication: Secondary | ICD-10-CM | POA: Diagnosis not present

## 2022-12-25 DIAGNOSIS — F330 Major depressive disorder, recurrent, mild: Secondary | ICD-10-CM

## 2022-12-25 DIAGNOSIS — G9689 Other specified disorders of central nervous system: Secondary | ICD-10-CM

## 2022-12-25 DIAGNOSIS — E119 Type 2 diabetes mellitus without complications: Secondary | ICD-10-CM | POA: Diagnosis not present

## 2022-12-25 DIAGNOSIS — G3184 Mild cognitive impairment, so stated: Secondary | ICD-10-CM

## 2022-12-25 DIAGNOSIS — E2839 Other primary ovarian failure: Secondary | ICD-10-CM

## 2022-12-25 DIAGNOSIS — F331 Major depressive disorder, recurrent, moderate: Secondary | ICD-10-CM | POA: Diagnosis not present

## 2022-12-25 MED ORDER — EMPAGLIFLOZIN 10 MG PO TABS: ORAL_TABLET | ORAL | 1 refills | Status: DC

## 2022-12-25 MED ORDER — DONEPEZIL HCL 10 MG PO TABS: 10.0000 mg | ORAL_TABLET | Freq: Every day | ORAL | 2 refills | Status: DC

## 2022-12-25 MED ORDER — QUETIAPINE FUMARATE 25 MG PO TABS: 25.0000 mg | ORAL_TABLET | Freq: Every day | ORAL | 1 refills | Status: DC

## 2022-12-25 MED ORDER — ESCITALOPRAM OXALATE 20 MG PO TABS: 20.0000 mg | ORAL_TABLET | Freq: Every day | ORAL | 1 refills | Status: DC

## 2022-12-25 NOTE — Telephone Encounter (Signed)
I have been updated by [name], PCP. Seroquel, escitalopram and donepezil were increased at today's visit. Patient is having more anxiety and depression following hospitalization of her husband. She has follow up with Dr Brett Fairy 01/2023 and I will make her aware of changes. She has been referred to Dr Mickeal Skinner, hem/onc for abnormal MRI.

## 2022-12-25 NOTE — Patient Instructions (Addendum)
Diabetes Mellitus Action Plan Following a diabetes action plan is a way for you to manage your diabetes (diabetes mellitus) symptoms. The plan is color-coded to help you understand what actions you need to take based on any symptoms you are having. If you have symptoms in the red zone, you need medical care right away. If you have symptoms in the yellow zone, you are having problems. If you have symptoms in the green zone, you are doing well. Learning about and understanding diabetes can take time. Follow the plan that you develop with your health care provider. Know the target range for your blood sugar (glucose) level, and review your treatment plan with your health care provider at each visit. The target range for my blood sugar level is __________________________ mg/dL. Red zone Get medical help right away if you have any of the following symptoms: A blood sugar test result that is below 54 mg/dL (3 mmol/L). A blood sugar test result that is at or above 240 mg/dL (13.3 mmol/L) for 2 days in a row. Confusion or trouble thinking clearly. Difficulty breathing. Sickness or a fever for 2 or more days that is not getting better. Moderate or large ketone levels in your urine. Feeling tired or having no energy. If you have any red zone symptoms, do not wait to see if the symptoms will go away. Get medical help right away. Call your local emergency services (911 in the [place] Do not drive yourself to the hospital. If you have severely low blood sugar (severe hypoglycemia) and you cannot eat or drink, you may need glucagon. Make sure a family member or close friend knows how to check your blood sugar and how to give you glucagon. You may need to be treated in a hospital for this condition. Yellow zone If you have any of the following symptoms, your diabetes is not under control and you may need to make some changes: A blood sugar test result that is at or above 240 mg/dL (13.3 mmol/L) for 2 days in a  row. Blood sugar test results that are below 70 mg/dL (3.9 mmol/L). Other symptoms of hypoglycemia, such as: Shaking or feeling light-headed. Confusion or irritability. Feeling hungry. Having a fast heartbeat. If you have any yellow zone symptoms: Treat your hypoglycemia by eating or drinking 15 grams of a rapid-acting carbohydrate. Follow the 15:15 rule: Take 15 grams of a rapid-acting carbohydrate, such as: 1 tube of glucose gel. 4 glucose pills. 4 oz (120 mL) of fruit juice. 4 oz (120 mL) of regular (not diet) soda. Check your blood sugar 15 minutes after you take the carbohydrate. If the repeat blood sugar test is still at or below 70 mg/dL (3.9 mmol/L), take 15 grams of a carbohydrate again. If your blood sugar does not increase above 70 mg/dL (3.9 mmol/L) after 3 tries, get medical help right away. After your blood sugar returns to normal, eat a meal or a snack within 1 hour. Keep taking your daily medicines as told by your health care provider. Check your blood sugar more often than you normally would. Write down your results. Call your health care provider if you have trouble keeping your blood sugar in your target range.  Green zone These signs mean you are doing well and you can continue what you are doing to manage your diabetes: Your blood sugar is within your personal target range. For most people, a blood sugar level before a meal (preprandial) should be 80-130 mg/dL (4.4-7.2 mmol/L). You feel   well, and you are able to do daily activities. If you are in the green zone, continue to manage your diabetes as told by your health care provider. To do this: Eat a healthy diet. Exercise regularly. Check your blood sugar as told by your health care provider. Take your medicines as told by your health care provider.  Where to find more information American Diabetes Association (ADA): diabetes.org Association of Diabetes Care & Education Specialists (ADCES):  diabeteseducator.org Summary Following a diabetes action plan is a way for you to manage your diabetes symptoms. The plan is color-coded to help you understand what actions you need to take based on any symptoms you are having. Follow the plan that you develop with your health care provider. Make sure you know your personal target blood sugar level. Review your treatment plan with your health care provider at each visit. This information is not intended to replace advice given to you by your health care provider. Make sure you discuss any questions you have with your health care provider. Document Revised: 06/02/2020 Document Reviewed: 06/02/2020 Elsevier Patient Education  [place].  Zoster Vaccine Injection What is this medication? ZOSTER VACCINE (ZOS ter vak SEEN) reduces the risk of herpes zoster (shingles). It does not treat shingles. It is still possible to get shingles after receiving the vaccine, but the symptoms may be less severe or not last as long. It works by helping your immune system learn how to fight off a future infection. This medicine may be used for other purposes; ask your health care provider or pharmacist if you have questions. COMMON BRAND NAME(S): [facility] What should I tell my care team before I take this medication? They need to know if you have any of these conditions: Cancer Immune system problems An unusual or allergic reaction to Zoster vaccine, other medications, foods, dyes, or preservatives Pregnant or trying to get pregnant Breastfeeding How should I use this medication? This vaccine is injected into a muscle. It is given by your care team. This vaccine requires 2 doses to get the full benefit. Set a reminder for when your next dose is due. A copy of Vaccine Information Statements will be given before each vaccination. Be sure to read this information carefully each time. This sheet may change often. Talk to your care team about the use of this  vaccine in children. This vaccine is not approved for use in children. Overdosage: If you think you have taken too much of this medicine contact a poison control center or emergency room at once. NOTE: This medicine is only for you. Do not share this medicine with others. What if I miss a dose? Keep appointments for follow-up (booster) doses. It is important not to miss your dose. Call your care team if you are unable to keep an appointment. What may interact with this medication? Medications that suppress your immune system Medications to treat cancer Steroid medications, such as prednisone or cortisone This list may not describe all possible interactions. Give your health care provider a list of all the medicines, herbs, non-prescription drugs, or dietary supplements you use. Also tell them if you smoke, drink alcohol, or use illegal drugs. Some items may interact with your medicine. What should I watch for while using this medication? Visit your care team regularly. This vaccine, like all vaccines, may not fully protect everyone. What side effects may I notice from receiving this medication? Side effects that you should report to your care team as soon as possible:  Allergic reactions--skin rash, itching, hives, swelling of the face, lips, tongue, or throat Side effects that usually do not require medical attention (report these to your care team if they continue or are bothersome): Chills Fatigue Feeling faint or lightheaded Fever Headache Muscle pain Pain, redness, or irritation at injection site This list may not describe all possible side effects. Call your doctor for medical advice about side effects. You may report side effects to FDA at 1-800-FDA-1088. Where should I keep my medication? This vaccine is only given by your care team. It will not be stored at home. NOTE: This sheet is a summary. It may not cover all possible information. If you have questions about this medicine,  talk to your doctor, pharmacist, or health care provider.  2023 Elsevier/Gold Standard (2022-05-09 00:00:00)

## 2022-12-25 NOTE — Progress Notes (Signed)
I,Victoria T Hamilton,acting as a Education administrator for [name], FNP.,have documented all relevant documentation on the behalf of [name], FNP,as directed by  [name], FNP while in the presence of [name], [place].    Subjective:     Patient ID: Bridget Good , female    DOB: 25-Jan-1951 , 72 y.o.   MRN: [id number]   Chief Complaint  Patient presents with   Diabetes   Hypertension    HPI  Patient is here for DM and HTN follow up.  She is accompanied by her son and her husband. Son feels memory is about the same. She adds, her Husband currently sick in the hospital since Friday, not being with her makes her feel really sad.  She cries herself to sleep most nights. Which makes her head hurts. Her son is requesting an FL2 form completed if she is to go to a senior community. She has been staying with her daughter in the interim.   Her son is interested in a med for depression & anxiety.  Vania Rea, her son states she had previously taken [place]. They were getting samples from the office here. She been without jardiance for a while.   She is not having any incontinence.   She is to have cataract removal in the next couple months. And she has seen Dr. Terrence Dupont       Hypertension This is a chronic problem. The current episode started more than 1 year ago. The problem is unchanged. The problem is controlled. Associated symptoms include headaches (at least 3 times a week). Pertinent negatives include no anxiety, chest pain, palpitations or shortness of breath. Risk factors for coronary artery disease include sedentary lifestyle, obesity and diabetes mellitus. There are no compliance problems.  There is no history of angina. There is no history of chronic renal disease.  Diabetes She presents for her follow-up diabetic visit. She has type 2 diabetes mellitus. Her disease course has been improving. Hypoglycemia symptoms include headaches (at least 3 times a week). Pertinent  negatives for hypoglycemia include no confusion, dizziness or nervousness/anxiousness. Pertinent negatives for diabetes include no chest pain, no fatigue, no polydipsia, no polyphagia, no polyuria and no weakness. There are no hypoglycemic complications. Symptoms are stable. There are no diabetic complications. Risk factors for coronary artery disease include post-menopausal, hypertension, sedentary lifestyle and obesity. Current diabetic treatment includes oral agent (dual therapy). She is compliant with treatment most of the time. She has not had a previous visit with a dietitian. She rarely participates in exercise. (She has not been checking her blood sugar due to not knowing how to use the machine) She does not see a podiatrist.Eye exam is not current.     Past Medical History:  Diagnosis Date   Angina    Anxiety    Bronchitis    Depression    Diabetes mellitus    Fibromyalgia    GERD (gastroesophageal reflux disease)    Headache(784.0)    Hyperlipidemia    Hypertension    Hypertensive heart disease without CHF    Lupus ([place])    "treated for it from 1992 til 2012; dr said I don't have it anymore"   Obesity (BMI 30-39.9)    Osteoarthritis    Pneumonia    Shortness of breath    lying down, upon exertion   Shortness of breath on exertion    Stroke [facility])    2019     Family History  Problem Relation Age of Onset  Hypertension Mother    Heart attack Father    Diabetes Father    Diabetes Sister    Diabetes Sister    Heart attack Brother    Kidney failure Brother    Breast cancer Neg Hx      Current Outpatient Medications:    amLODipine-benazepril (LOTREL) 10-40 MG capsule, TAKE 1 CAPSULE BY MOUTH DAILY.(AM), Disp: 90 capsule, Rfl: 1   clopidogrel (PLAVIX) 75 MG tablet, TAKE ONE TABLET BY MOUTH ONCE DAILY (AM), Disp: 90 tablet, Rfl: 1   diclofenac Sodium (VOLTAREN) 1 % GEL, APPLY 2 GRAMS TOPICALLY 4 (FOUR) TIMES DAILY., Disp: 100 g, Rfl: 2   metoprolol succinate (TOPROL-XL)  50 MG 24 hr tablet, TAKE 1 TABLET BY MOUTH DAILY. TAKE WITH OR IMMEDIATELY FOLLOWING A MEAL (AM), Disp: 90 tablet, Rfl: 1   nitroGLYCERIN (NITROSTAT) 0.4 MG SL tablet, Place 1 tablet (0.4 mg total) under the tongue every 5 (five) minutes as needed for chest pain., Disp: 25 tablet, Rfl: 12   pantoprazole (PROTONIX) 40 MG tablet, TAKE ONE TABLET BY MOUTH DAILY IN THE MORNING., Disp: 90 tablet, Rfl: 1   potassium chloride SA (KLOR-CON M) 20 MEQ tablet, TAKE ONE TABLET BY MOUTH ONCE DAILY (AM), Disp: 90 tablet, Rfl: 1   rosuvastatin (CRESTOR) 20 MG tablet, TAKE ONE TABLET BY MOUTH ONCE DAILY (BEDTIME), Disp: 90 tablet, Rfl: 1   Vitamin D, Ergocalciferol, (DRISDOL) 1.25 MG (50000 UNIT) CAPS capsule, TAKE 1 CAPSULE TWICE A WEEK ( MONDAY & THURSDAY), Disp: 24 capsule, Rfl: 1   acetaminophen (TYLENOL) 500 MG tablet, Take 1 tablet (500 mg total) by mouth as needed for mild pain. (Patient not taking: Reported on 12/25/2022), Disp: 90 tablet, Rfl: 1   ASPIRIN LOW DOSE 81 MG tablet, TAKE ONE TABLET BY MOUTH ONCE DAILY (AM) (Patient not taking: Reported on 09/26/2022), Disp: 90 tablet, Rfl: 1   donepezil (ARICEPT) 10 MG tablet, Take 1 tablet (10 mg total) by mouth at bedtime., Disp: 30 tablet, Rfl: 2   empagliflozin (JARDIANCE) 10 MG TABS tablet, TAKE 1 TABLET (10 MG TOTAL) BY MOUTH DAILY. (AM), Disp: 90 tablet, Rfl: 1   escitalopram (LEXAPRO) 20 MG tablet, Take 1 tablet (20 mg total) by mouth daily., Disp: 90 tablet, Rfl: 1   QUEtiapine (SEROQUEL) 25 MG tablet, Take 1 tablet (25 mg total) by mouth at bedtime., Disp: 90 tablet, Rfl: 1   Semaglutide (RYBELSUS) 3 MG TABS, Take 1 tablet by mouth daily. Take 30 minutes before breakfast (Patient not taking: Reported on 12/25/2022), Disp: 90 tablet, Rfl: 1   Allergies  Allergen Reactions   Sulfa Antibiotics Hives, Itching and Nausea And Vomiting     Review of Systems  Constitutional: Negative.  Negative for fatigue.  Respiratory: Negative.  Negative for shortness of  breath.   Cardiovascular: Negative.  Negative for chest pain and palpitations.  Endocrine: Negative for polydipsia, polyphagia and polyuria.  Neurological:  Positive for headaches (at least 3 times a week). Negative for dizziness and weakness.  Psychiatric/Behavioral: Negative.  Negative for confusion. The patient is not nervous/anxious.      Today's Vitals   12/25/22 1507  BP: 118/88  Pulse: (!) 54  Temp: 98.5 F (36.9 C)  SpO2: 98%  Weight: 165 lb (74.8 kg)  Height: '5\' 5"'$  (1.651 m)   Body mass index is 27.46 kg/m.   Objective:  Physical Exam Vitals reviewed.  Constitutional:      General: She is not in acute distress.    Appearance: Normal appearance.  Cardiovascular:     Rate and Rhythm: Normal rate and regular rhythm.     Pulses: Normal pulses.     Heart sounds: Normal heart sounds. No murmur heard. Pulmonary:     Effort: Pulmonary effort is normal. No respiratory distress.     Breath sounds: Normal breath sounds. No wheezing.  Skin:    Capillary Refill: Capillary refill takes less than 2 seconds.  Neurological:     General: No focal deficit present.     Mental Status: She is alert and oriented to person, place, and time.     Cranial Nerves: No cranial nerve deficit.     Motor: No weakness.  Psychiatric:        Mood and Affect: Mood normal.        Behavior: Behavior normal.        Thought Content: Thought content normal.        Judgment: Judgment normal.         Assessment And Plan:     1. Benign essential hypertension Comments: Blood pressure is fairly controlled, continue current medications.  2. Moderate episode of recurrent major depressive disorder (HCC) Comments: Will increase medications since she is more tearful since her husband is in the hospital - QUEtiapine (SEROQUEL) 25 MG tablet; Take 1 tablet (25 mg total) by mouth at bedtime.  Dispense: 90 tablet; Refill: 1 - escitalopram (LEXAPRO) 20 MG tablet; Take 1 tablet (20 mg total) by mouth daily.   Dispense: 90 tablet; Refill: 1  3. Dyslipidemia associated with type 2 diabetes mellitus (HCC) - CMP14+EGFR - Lipid panel  4. Moderate early onset Alzheimer's dementia with anxiety ([place]) - donepezil (ARICEPT) 10 MG tablet; Take 1 tablet (10 mg total) by mouth at bedtime.  Dispense: 30 tablet; Refill: 2  5. Type 2 diabetes mellitus without complication, without long-term current use of insulin (HCC) Comments: will check HgbA1c and make changes to medications pending results, she has still not been able to get Jardiance, family is to complete patient assistance form. - empagliflozin (JARDIANCE) 10 MG TABS tablet; TAKE 1 TABLET (10 MG TOTAL) BY MOUTH DAILY. (AM)  Dispense: 90 tablet; Refill: 1 - CMP14+EGFR - Hemoglobin A1c  6. CNS mass Comments: She has not seen the Neurosurgeon in quite some time, advised to f/u with Neurosurgery. Abnormal lesions seen on skull will refer to hematology - Ambulatory referral to Hematology / Oncology  7. Screening for tuberculosis Comments: Will obtain, TB quantiferon due to possible admission to nursing facility - QuantiFERON-TB Gold Plus  8. Need for shingles vaccine Comments: Shingrix #1 given in office, TransRx done - Zoster Recombinant (Shingrix )  9. Decreased estrogen level     Patient was given opportunity to ask questions. Patient verbalized understanding of the plan and was able to repeat key elements of the plan. All questions were answered to their satisfaction.  [name], FNP   I, [name], FNP, have reviewed all documentation for this visit. The documentation on 12/25/22 for the exam, diagnosis, procedures, and orders are all accurate and complete.   IF YOU HAVE BEEN REFERRED TO A SPECIALIST, IT MAY TAKE 1-2 WEEKS TO SCHEDULE/PROCESS THE REFERRAL. IF YOU HAVE NOT HEARD FROM US/SPECIALIST IN TWO WEEKS, PLEASE GIVE [place] A CALL AT [phone].   THE PATIENT IS ENCOURAGED TO PRACTICE SOCIAL DISTANCING DUE TO THE COVID-19  PANDEMIC.

## 2022-12-27 LAB — CMP14+EGFR
ALT: 12 IU/L (ref 0–32)
AST: 17 IU/L (ref 0–40)
Albumin/Globulin Ratio: 1.6 (ref 1.2–2.2)
Albumin: 4 g/dL (ref 3.9–4.9)
Alkaline Phosphatase: 73 IU/L (ref 44–121)
BUN/Creatinine Ratio: 16 (ref 12–28)
BUN: 16 mg/dL (ref 8–27)
Bilirubin Total: 1 mg/dL (ref 0.0–1.2)
CO2: 25 mmol/L (ref 20–29)
Calcium: 9.4 mg/dL (ref 8.7–10.3)
Chloride: 107 mmol/L — ABNORMAL HIGH (ref 96–106)
Creatinine, Ser: 1.03 mg/dL — ABNORMAL HIGH (ref 0.57–1.00)
Globulin, Total: 2.5 g/dL (ref 1.5–4.5)
Glucose: 96 mg/dL (ref 70–99)
Potassium: 4.6 mmol/L (ref 3.5–5.2)
Sodium: 146 mmol/L — ABNORMAL HIGH (ref 134–144)
Total Protein: 6.5 g/dL (ref 6.0–8.5)
eGFR: 59 mL/min/{1.73_m2} — ABNORMAL LOW (ref >59)

## 2022-12-27 LAB — QUANTIFERON-TB GOLD PLUS
QuantiFERON Mitogen Value: >10 IU/mL
QuantiFERON Nil Value: 0 IU/mL
QuantiFERON TB1 Ag Value: 0 IU/mL
QuantiFERON TB2 Ag Value: 0 IU/mL
QuantiFERON-TB Gold Plus: NEGATIVE

## 2022-12-27 LAB — LIPID PANEL
Chol/HDL Ratio: 2.7 ratio (ref 0.0–4.4)
Cholesterol, Total: 188 mg/dL (ref 100–199)
HDL: 69 mg/dL (ref >39)
LDL Chol Calc (NIH): 103 mg/dL — ABNORMAL HIGH (ref 0–99)
Triglycerides: 88 mg/dL (ref 0–149)
VLDL Cholesterol Cal: 16 mg/dL (ref 5–40)

## 2022-12-27 LAB — HEMOGLOBIN A1C
Est. average glucose Bld gHb Est-mCnc: 140 mg/dL
Hgb A1c MFr Bld: 6.5 % — ABNORMAL HIGH (ref 4.8–5.6)

## 2023-01-08 ENCOUNTER — Other Ambulatory Visit (INDEPENDENT_AMBULATORY_CARE_PROVIDER_SITE_OTHER): Payer: Medicare Other | Admitting: Nurse Practitioner

## 2023-01-08 DIAGNOSIS — R41841 Cognitive communication deficit: Secondary | ICD-10-CM

## 2023-01-08 DIAGNOSIS — Z7984 Long term (current) use of oral hypoglycemic drugs: Secondary | ICD-10-CM | POA: Diagnosis not present

## 2023-01-08 DIAGNOSIS — G3184 Mild cognitive impairment, so stated: Secondary | ICD-10-CM | POA: Diagnosis not present

## 2023-01-08 DIAGNOSIS — E1169 Type 2 diabetes mellitus with other specified complication: Secondary | ICD-10-CM | POA: Diagnosis not present

## 2023-01-08 DIAGNOSIS — E785 Hyperlipidemia, unspecified: Secondary | ICD-10-CM

## 2023-01-08 DIAGNOSIS — E782 Mixed hyperlipidemia: Secondary | ICD-10-CM

## 2023-01-08 DIAGNOSIS — F321 Major depressive disorder, single episode, moderate: Secondary | ICD-10-CM

## 2023-01-08 DIAGNOSIS — I10 Essential (primary) hypertension: Secondary | ICD-10-CM

## 2023-01-08 NOTE — Progress Notes (Signed)
Received home health orders orders from [facility]. Start of care 11/29/2021.   Certification and orders from 07/27/2022 through 09/24/2022 are reviewed, signed and faxed back to home health company.  Need of intermittent skilled services at home: SN  The home health care plan has been established by me and will be reviewed and updated as needed to maximize patient recovery.  I certify that all home health services have been and will be furnished to the patient while under my care.  Face-to-face encounter in which the need for home health services was established: 11/29/2021  Patient is receiving home health services for the following diagnoses: Problem List Items Addressed This Visit       Endocrine   Dyslipidemia associated with type 2 diabetes mellitus ([place])     Other   Mixed hyperlipidemia   Other Visit Diagnoses     Cognitive communication deficit [R41.841]    -  Primary   Essential hypertension, benign [I10]       Long term current use of oral hypoglycemic drug [Z79.84]       Mild cognitive impairment [G31.84]       Moderate major depression ([place]) [F32.1]            [name], FNP

## 2023-01-09 ENCOUNTER — Other Ambulatory Visit: Payer: Self-pay | Admitting: Nurse Practitioner

## 2023-01-09 DIAGNOSIS — E559 Vitamin D deficiency, unspecified: Secondary | ICD-10-CM

## 2023-01-10 ENCOUNTER — Ambulatory Visit: Payer: Medicare HMO | Admitting: Nurse Practitioner

## 2023-01-11 DIAGNOSIS — H25811 Combined forms of age-related cataract, right eye: Secondary | ICD-10-CM | POA: Diagnosis not present

## 2023-01-14 ENCOUNTER — Telehealth: Payer: Self-pay

## 2023-01-14 NOTE — Telephone Encounter (Signed)
Spoke with patient's daughter to advise that both Dr. Lorenso Courier and Dr. Mickeal Skinner has reviewed the referral and the patient's chart and both were in agreement that per Dr. 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  Per Dr. Mickeal Skinner, "I don't have any reason (personally) for med onc.  Lesion isn't behaving as neoplasm"   Sent in-basket message to [name] FNP requesting that the referral be rescinded.

## 2023-01-16 ENCOUNTER — Other Ambulatory Visit: Payer: Medicare Other

## 2023-01-16 ENCOUNTER — Ambulatory Visit: Payer: Medicare Other | Admitting: Hematology and Oncology

## 2023-01-21 ENCOUNTER — Ambulatory Visit: Payer: Medicare Other | Admitting: Neurology

## 2023-01-25 ENCOUNTER — Telehealth: Payer: Self-pay

## 2023-01-25 ENCOUNTER — Other Ambulatory Visit: Payer: Self-pay

## 2023-01-25 DIAGNOSIS — R634 Abnormal weight loss: Secondary | ICD-10-CM | POA: Insufficient documentation

## 2023-01-25 DIAGNOSIS — Z1211 Encounter for screening for malignant neoplasm of colon: Secondary | ICD-10-CM | POA: Insufficient documentation

## 2023-01-25 DIAGNOSIS — K5730 Diverticulosis of large intestine without perforation or abscess without bleeding: Secondary | ICD-10-CM | POA: Insufficient documentation

## 2023-01-25 DIAGNOSIS — R159 Full incontinence of feces: Secondary | ICD-10-CM | POA: Insufficient documentation

## 2023-01-25 DIAGNOSIS — R194 Change in bowel habit: Secondary | ICD-10-CM | POA: Insufficient documentation

## 2023-01-25 DIAGNOSIS — R1011 Right upper quadrant pain: Secondary | ICD-10-CM | POA: Insufficient documentation

## 2023-01-25 DIAGNOSIS — R141 Gas pain: Secondary | ICD-10-CM | POA: Insufficient documentation

## 2023-01-25 DIAGNOSIS — R1033 Periumbilical pain: Secondary | ICD-10-CM | POA: Insufficient documentation

## 2023-01-25 DIAGNOSIS — Z860100 Personal history of colon polyps, unspecified: Secondary | ICD-10-CM | POA: Insufficient documentation

## 2023-01-25 DIAGNOSIS — R110 Nausea: Secondary | ICD-10-CM | POA: Insufficient documentation

## 2023-01-25 DIAGNOSIS — R1032 Left lower quadrant pain: Secondary | ICD-10-CM | POA: Insufficient documentation

## 2023-01-25 DIAGNOSIS — R152 Fecal urgency: Secondary | ICD-10-CM | POA: Insufficient documentation

## 2023-01-25 DIAGNOSIS — R1111 Vomiting without nausea: Secondary | ICD-10-CM | POA: Insufficient documentation

## 2023-01-25 NOTE — Telephone Encounter (Signed)
FL2 form needs location of desired facility  LVM for return call

## 2023-02-01 ENCOUNTER — Ambulatory Visit: Payer: Medicare Other | Admitting: Podiatry

## 2023-02-01 NOTE — Telephone Encounter (Signed)
Per daughter, Pennie Rushing (DPR), they are not currently placing patient at this time. She will contact us if they decide to pursue placement in the future. Nothing further needed at this time.

## 2023-02-18 ENCOUNTER — Ambulatory Visit (INDEPENDENT_AMBULATORY_CARE_PROVIDER_SITE_OTHER): Payer: Medicare Other | Admitting: Podiatry

## 2023-02-18 ENCOUNTER — Encounter: Payer: Self-pay | Admitting: Podiatry

## 2023-02-18 DIAGNOSIS — D689 Coagulation defect, unspecified: Secondary | ICD-10-CM | POA: Diagnosis not present

## 2023-02-18 DIAGNOSIS — M79674 Pain in right toe(s): Secondary | ICD-10-CM

## 2023-02-18 DIAGNOSIS — M79675 Pain in left toe(s): Secondary | ICD-10-CM

## 2023-02-18 DIAGNOSIS — E119 Type 2 diabetes mellitus without complications: Secondary | ICD-10-CM

## 2023-02-18 DIAGNOSIS — B351 Tinea unguium: Secondary | ICD-10-CM | POA: Diagnosis not present

## 2023-02-18 NOTE — Progress Notes (Signed)
  Subjective:  Patient ID: Bridget Good, female    DOB: [birth year],   MRN: [id number]  Chief Complaint  Patient presents with   Nail Problem    Nail trim    72 y.o. female presents for concern of thickened elongated and painful nails that are difficult to trim. Requesting to have them trimmed today. Denies any burning or tingling in her feet. Patient is diabetic and last A1c was 6.5 on 12/25/22.  Denies any other pedal complaints. Denies n/v/f/c.   PCP: [name] FNP   Past Medical History:  Diagnosis Date   Angina    Anxiety    Bronchitis    Depression    Diabetes mellitus    Fibromyalgia    GERD (gastroesophageal reflux disease)    Headache(784.0)    Hyperlipidemia    Hypertension    Hypertensive heart disease without CHF    Lupus ([place])    "treated for it from 1992 til 2012; dr said I don't have it anymore"   Obesity (BMI 30-39.9)    Osteoarthritis    Pneumonia    Shortness of breath    lying down, upon exertion   Shortness of breath on exertion    Stroke ([place])    2019    Objective:  Physical Exam: Vascular: DP/PT pulses 2/4 bilateral. CFT <3 seconds. Normal hair growth on digits. No edema.  Skin. No lacerations or abrasions bilateral feet. Nails 1-5 are thickened discolored and elongated with subungual debris.  Musculoskeletal: MMT 5/5 bilateral lower extremities in DF, PF, Inversion and Eversion. Deceased ROM in DF of ankle joint.  Neurological: Sensation intact to light touch.   Assessment:   1. Pain due to onychomycosis of toenails of both feet   2. Type 2 diabetes mellitus without complication, without long-term current use of insulin (HCC)   3. Blood clotting disorder ([place])        Plan:  Patient was evaluated and treated and all questions answered. -Discussed and educated patient on diabetic foot care, especially with  regards to the vascular, neurological and musculoskeletal systems.  -Stressed the importance of good glycemic control and the  detriment of not  controlling glucose levels in relation to the foot. -Discussed supportive shoes at all times and checking feet regularly.  -Mechanically debrided all nails 1-5 bilateral using sterile nail nipper and filed with dremel without incident  -Answered all patient questions -Patient to return  in 3 months for at risk foot care -Patient advised to call the office if any problems or questions arise in the meantime.   Lorenda Peck, DPM

## 2023-02-21 ENCOUNTER — Other Ambulatory Visit: Payer: Self-pay | Admitting: Nurse Practitioner

## 2023-03-15 ENCOUNTER — Other Ambulatory Visit: Payer: Self-pay | Admitting: Nurse Practitioner

## 2023-03-15 DIAGNOSIS — G300 Alzheimer's disease with early onset: Secondary | ICD-10-CM

## 2023-03-20 ENCOUNTER — Ambulatory Visit: Payer: Medicare Other | Admitting: Neurology

## 2023-03-20 ENCOUNTER — Encounter: Payer: Self-pay | Admitting: Neurology

## 2023-04-25 ENCOUNTER — Ambulatory Visit: Payer: Medicare Other | Admitting: Nurse Practitioner

## 2023-04-25 NOTE — Progress Notes (Deleted)
Hershal Coria Elleigh Cassetta,acting as a Neurosurgeon for Arnette Felts, FNP.,have documented all relevant documentation on the behalf of Arnette Felts, FNP,as directed by  Arnette Felts, FNP while in the presence of Arnette Felts, FNP.    Subjective:     Patient ID: Bridget Good , female    DOB: 11-23-1951 , 72 y.o.   MRN: [id number]   No chief complaint on file.   HPI  Patient presents today for a BPand cholesterol check, patient states compliance with medications and has no other concerns today. Patient is also getting her shingles vaccine    Past Medical History:  Diagnosis Date  . Angina   . Anxiety   . Bronchitis   . Depression   . Diabetes mellitus   . Fibromyalgia   . GERD (gastroesophageal reflux disease)   . Headache(784.0)   . Hyperlipidemia   . Hypertension   . Hypertensive heart disease without CHF   . Lupus (HCC)    "treated for it from 1992 til 2012; dr said I don't have it anymore"  . Obesity (BMI 30-39.9)   . Osteoarthritis   . Pneumonia   . Shortness of breath    lying down, upon exertion  . Shortness of breath on exertion   . Stroke [facility])    2019     Family History  Problem Relation Age of Onset  . Hypertension Mother   . Heart attack Father   . Diabetes Father   . Diabetes Sister   . Diabetes Sister   . Heart attack Brother   . Kidney failure Brother   . Breast cancer Neg Hx      Current Outpatient Medications:  .  acetaminophen (TYLENOL) 500 MG tablet, Take 1 tablet (500 mg total) by mouth as needed for mild pain. (Patient not taking: Reported on 12/25/2022), Disp: 90 tablet, Rfl: 1 .  amLODipine-benazepril (LOTREL) 10-40 MG capsule, TAKE 1 CAPSULE BY MOUTH DAILY.(AM), Disp: 90 capsule, Rfl: 1 .  ASPIRIN LOW DOSE 81 MG tablet, TAKE ONE TABLET BY MOUTH ONCE DAILY (AM), Disp: 90 tablet, Rfl: 1 .  clopidogrel (PLAVIX) 75 MG tablet, TAKE ONE TABLET BY MOUTH ONCE DAILY (AM), Disp: 90 tablet, Rfl: 1 .  diclofenac Sodium (VOLTAREN) 1 % GEL, APPLY 2 GRAMS  TOPICALLY 4 (FOUR) TIMES DAILY., Disp: 100 g, Rfl: 2 .  donepezil (ARICEPT) 10 MG tablet, TAKE 1 TABLET (10 MG TOTAL) BY MOUTH AT BEDTIME., Disp: 30 tablet, Rfl: 2 .  empagliflozin (JARDIANCE) 10 MG TABS tablet, TAKE 1 TABLET (10 MG TOTAL) BY MOUTH DAILY. (AM), Disp: 90 tablet, Rfl: 1 .  escitalopram (LEXAPRO) 10 MG tablet, Take 10 mg by mouth daily., Disp: , Rfl:  .  escitalopram (LEXAPRO) 20 MG tablet, Take 1 tablet (20 mg total) by mouth daily., Disp: 90 tablet, Rfl: 1 .  ketorolac (ACULAR) 0.5 % ophthalmic solution, Place 1 drop into the right eye 4 (four) times daily., Disp: , Rfl:  .  metoprolol succinate (TOPROL-XL) 50 MG 24 hr tablet, TAKE 1 TABLET BY MOUTH DAILY. TAKE WITH OR IMMEDIATELY FOLLOWING A MEAL (AM), Disp: 90 tablet, Rfl: 1 .  nitroGLYCERIN (NITROSTAT) 0.4 MG SL tablet, Place 1 tablet (0.4 mg total) under the tongue every 5 (five) minutes as needed for chest pain., Disp: 25 tablet, Rfl: 12 .  ofloxacin (OCUFLOX) 0.3 % ophthalmic solution, Place 1 drop into the right eye 4 (four) times daily., Disp: , Rfl:  .  pantoprazole (PROTONIX) 40 MG tablet, TAKE ONE TABLET BY  MOUTH DAILY IN THE MORNING., Disp: 90 tablet, Rfl: 1 .  potassium chloride SA (KLOR-CON M) 20 MEQ tablet, TAKE ONE TABLET BY MOUTH ONCE DAILY (AM), Disp: 90 tablet, Rfl: 1 .  prednisoLONE acetate (PRED FORTE) 1 % ophthalmic suspension, Place 1 drop into the right eye 4 (four) times daily., Disp: , Rfl:  .  QUEtiapine (SEROQUEL) 25 MG tablet, Take 1 tablet (25 mg total) by mouth at bedtime., Disp: 90 tablet, Rfl: 1 .  rosuvastatin (CRESTOR) 20 MG tablet, TAKE ONE TABLET BY MOUTH ONCE DAILY (BEDTIME), Disp: 90 tablet, Rfl: 1 .  Semaglutide (RYBELSUS) 3 MG TABS, Take 1 tablet by mouth daily. Take 30 minutes before breakfast (Patient not taking: Reported on 12/25/2022), Disp: 90 tablet, Rfl: 1 .  Vitamin D, Ergocalciferol, (DRISDOL) 1.25 MG (50000 UNIT) CAPS capsule, TAKE 1 CAPSULE TWICE A WEEK ( MONDAY & THURSDAY), Disp: 24  capsule, Rfl: 1   Allergies  Allergen Reactions  . Sulfa Antibiotics Hives, Itching and Nausea And Vomiting     Review of Systems   There were no vitals filed for this visit. There is no height or weight on file to calculate BMI.  The ASCVD Risk score (Arnett DK, et al., 2019) failed to calculate for the following reasons:   The patient has a prior MI or stroke diagnosis ++ Objective:  Physical Exam      Assessment And Plan:     1. Essential hypertension, benign [I10]  2. Dyslipidemia associated with type 2 diabetes mellitus (HCC) [E11.69, E78.5]  3. Mild cognitive impairment [G31.84]  4. Mixed hyperlipidemia [E78.2]    No follow-ups on file.  Patient was given opportunity to ask questions. Patient verbalized understanding of the plan and was able to repeat key elements of the plan. All questions were answered to their satisfaction.  Marlyn Corporal, CMA   I, Marlyn Corporal, CMA, have reviewed all documentation for this visit. The documentation on 04/25/23 for the exam, diagnosis, procedures, and orders are all accurate and complete.   IF YOU HAVE BEEN REFERRED TO A SPECIALIST, IT MAY TAKE 1-2 WEEKS TO SCHEDULE/PROCESS THE REFERRAL. IF YOU HAVE NOT HEARD FROM US/SPECIALIST IN TWO WEEKS, PLEASE GIVE [place] A CALL AT [phone].   THE PATIENT IS ENCOURAGED TO PRACTICE SOCIAL DISTANCING DUE TO THE COVID-19 PANDEMIC.

## 2023-05-21 ENCOUNTER — Ambulatory Visit (INDEPENDENT_AMBULATORY_CARE_PROVIDER_SITE_OTHER): Payer: Medicare Other | Admitting: Podiatry

## 2023-05-21 DIAGNOSIS — Z91199 Patient's noncompliance with other medical treatment and regimen due to unspecified reason: Secondary | ICD-10-CM

## 2023-05-21 NOTE — Progress Notes (Signed)
No show

## 2023-05-23 ENCOUNTER — Other Ambulatory Visit: Payer: Self-pay | Admitting: Nurse Practitioner

## 2023-05-23 DIAGNOSIS — G300 Alzheimer's disease with early onset: Secondary | ICD-10-CM

## 2023-06-04 ENCOUNTER — Telehealth: Payer: Self-pay | Admitting: Nurse Practitioner

## 2023-06-04 NOTE — Telephone Encounter (Signed)
Called pt to schedule appt no answer/left VM.  

## 2023-06-21 ENCOUNTER — Other Ambulatory Visit: Payer: Self-pay | Admitting: Nurse Practitioner

## 2023-06-21 DIAGNOSIS — E119 Type 2 diabetes mellitus without complications: Secondary | ICD-10-CM

## 2023-07-22 ENCOUNTER — Other Ambulatory Visit: Payer: Self-pay | Admitting: Nurse Practitioner

## 2023-07-22 DIAGNOSIS — F331 Major depressive disorder, recurrent, moderate: Secondary | ICD-10-CM

## 2023-07-22 DIAGNOSIS — E559 Vitamin D deficiency, unspecified: Secondary | ICD-10-CM

## 2023-07-23 DIAGNOSIS — F01A0 Vascular dementia, mild, without behavioral disturbance, psychotic disturbance, mood disturbance, and anxiety: Secondary | ICD-10-CM | POA: Diagnosis not present

## 2023-07-23 DIAGNOSIS — E559 Vitamin D deficiency, unspecified: Secondary | ICD-10-CM | POA: Diagnosis not present

## 2023-07-23 DIAGNOSIS — C410 Malignant neoplasm of bones of skull and face: Secondary | ICD-10-CM | POA: Diagnosis not present

## 2023-07-23 DIAGNOSIS — Z1159 Encounter for screening for other viral diseases: Secondary | ICD-10-CM | POA: Diagnosis not present

## 2023-07-23 DIAGNOSIS — I119 Hypertensive heart disease without heart failure: Secondary | ICD-10-CM | POA: Diagnosis not present

## 2023-07-23 DIAGNOSIS — D332 Benign neoplasm of brain, unspecified: Secondary | ICD-10-CM | POA: Diagnosis not present

## 2023-07-23 DIAGNOSIS — E785 Hyperlipidemia, unspecified: Secondary | ICD-10-CM | POA: Diagnosis not present

## 2023-07-23 DIAGNOSIS — E1151 Type 2 diabetes mellitus with diabetic peripheral angiopathy without gangrene: Secondary | ICD-10-CM | POA: Diagnosis not present

## 2023-07-23 DIAGNOSIS — Z136 Encounter for screening for cardiovascular disorders: Secondary | ICD-10-CM | POA: Diagnosis not present

## 2023-07-23 DIAGNOSIS — Z79899 Other long term (current) drug therapy: Secondary | ICD-10-CM | POA: Diagnosis not present

## 2023-08-07 DIAGNOSIS — I25118 Atherosclerotic heart disease of native coronary artery with other forms of angina pectoris: Secondary | ICD-10-CM | POA: Diagnosis not present

## 2023-08-07 DIAGNOSIS — N1831 Chronic kidney disease, stage 3a: Secondary | ICD-10-CM | POA: Diagnosis not present

## 2023-08-07 DIAGNOSIS — E1151 Type 2 diabetes mellitus with diabetic peripheral angiopathy without gangrene: Secondary | ICD-10-CM | POA: Diagnosis not present

## 2023-08-07 DIAGNOSIS — Z23 Encounter for immunization: Secondary | ICD-10-CM | POA: Diagnosis not present

## 2023-08-07 DIAGNOSIS — Z0001 Encounter for general adult medical examination with abnormal findings: Secondary | ICD-10-CM | POA: Diagnosis not present

## 2023-08-07 DIAGNOSIS — Z8673 Personal history of transient ischemic attack (TIA), and cerebral infarction without residual deficits: Secondary | ICD-10-CM | POA: Diagnosis not present

## 2023-08-07 DIAGNOSIS — I1310 Hypertensive heart and chronic kidney disease without heart failure, with stage 1 through stage 4 chronic kidney disease, or unspecified chronic kidney disease: Secondary | ICD-10-CM | POA: Diagnosis not present

## 2023-08-21 ENCOUNTER — Other Ambulatory Visit: Payer: Self-pay | Admitting: Nurse Practitioner

## 2023-08-21 DIAGNOSIS — F02B4 Dementia in other diseases classified elsewhere, moderate, with anxiety: Secondary | ICD-10-CM

## 2023-09-14 ENCOUNTER — Other Ambulatory Visit: Payer: Self-pay | Admitting: Nurse Practitioner

## 2023-09-14 DIAGNOSIS — F331 Major depressive disorder, recurrent, moderate: Secondary | ICD-10-CM

## 2023-10-07 ENCOUNTER — Emergency Department (HOSPITAL_BASED_OUTPATIENT_CLINIC_OR_DEPARTMENT_OTHER)
Admission: EM | Admit: 2023-10-07 | Discharge: 2023-10-08 | Disposition: A | Discharge status: Home / Self Care | Payer: Medicare Other | Attending: Emergency Medicine | Admitting: Emergency Medicine

## 2023-10-07 ENCOUNTER — Encounter (HOSPITAL_BASED_OUTPATIENT_CLINIC_OR_DEPARTMENT_OTHER): Payer: Self-pay

## 2023-10-07 ENCOUNTER — Emergency Department (HOSPITAL_BASED_OUTPATIENT_CLINIC_OR_DEPARTMENT_OTHER): Payer: Medicare Other | Admitting: Radiology

## 2023-10-07 ENCOUNTER — Other Ambulatory Visit: Payer: Self-pay

## 2023-10-07 ENCOUNTER — Emergency Department (HOSPITAL_BASED_OUTPATIENT_CLINIC_OR_DEPARTMENT_OTHER): Payer: Medicare Other

## 2023-10-07 DIAGNOSIS — S42402A Unspecified fracture of lower end of left humerus, initial encounter for closed fracture: Secondary | ICD-10-CM

## 2023-10-07 DIAGNOSIS — Z7901 Long term (current) use of anticoagulants: Secondary | ICD-10-CM | POA: Diagnosis not present

## 2023-10-07 DIAGNOSIS — Y92010 Kitchen of single-family (private) house as the place of occurrence of the external cause: Secondary | ICD-10-CM | POA: Insufficient documentation

## 2023-10-07 DIAGNOSIS — Z7982 Long term (current) use of aspirin: Secondary | ICD-10-CM | POA: Insufficient documentation

## 2023-10-07 DIAGNOSIS — W010XXA Fall on same level from slipping, tripping and stumbling without subsequent striking against object, initial encounter: Secondary | ICD-10-CM | POA: Insufficient documentation

## 2023-10-07 DIAGNOSIS — M79602 Pain in left arm: Secondary | ICD-10-CM | POA: Diagnosis present

## 2023-10-07 DIAGNOSIS — W19XXXA Unspecified fall, initial encounter: Secondary | ICD-10-CM

## 2023-10-07 DIAGNOSIS — I6782 Cerebral ischemia: Secondary | ICD-10-CM | POA: Diagnosis not present

## 2023-10-07 NOTE — ED Triage Notes (Signed)
Pt was leaning over trying to hear her television, lost her balance, and fell over. She fell on the left side. Her  left shoulder hurts, right hip and lower back hurts as well.

## 2023-10-07 NOTE — ED Notes (Signed)
Patient transported to X-ray 

## 2023-10-07 NOTE — ED Provider Notes (Signed)
EMERGENCY DEPARTMENT AT [facility] [address]  Provider Note  CSN: [id number] Arrival date & time: 10/07/23 2046  History Chief Complaint  Patient presents with   Bridget Good is a 72 y.o. female brought by son for evaluation after a fall. She has some memory problems and is not exactly sure how she fell, initially said she lost her balance while leaning forward to hear the TV better but actually she fell in her kitchen getting something out of the cabinet. She denies head injury as far as she remembers. Landed on her left side, complaining of L arm and hip pain as well as low back pain.    Home Medications Prior to Admission medications   Medication Sig Start Date End Date Taking? Authorizing Provider  acetaminophen (TYLENOL) 500 MG tablet Take 1 tablet (500 mg total) by mouth as needed for mild pain. Patient not taking: Reported on 12/25/2022 06/04/19   Arnette Felts, FNP  amLODipine-benazepril (LOTREL) 10-40 MG capsule TAKE 1 CAPSULE BY MOUTH DAILY.(AM) 09/18/23   Arnette Felts, FNP  ASPIRIN LOW DOSE 81 MG tablet TAKE ONE TABLET BY MOUTH ONCE DAILY (AM) 08/21/23   Arnette Felts, FNP  clopidogrel (PLAVIX) 75 MG tablet TAKE ONE TABLET BY MOUTH ONCE DAILY (AM) 08/21/23   Arnette Felts, FNP  diclofenac Sodium (VOLTAREN) 1 % GEL APPLY 2 GRAMS TOPICALLY 4 (FOUR) TIMES DAILY. 10/04/20   Arnette Felts, FNP  donepezil (ARICEPT) 10 MG tablet TAKE 1 TABLET (10 MG TOTAL) BY MOUTH AT BEDTIME. 08/21/23 08/20/24  Arnette Felts, FNP  escitalopram (LEXAPRO) 10 MG tablet Take 10 mg by mouth daily. 01/09/23   [provider]  escitalopram (LEXAPRO) 20 MG tablet TAKE 1 TABLET (20 MG TOTAL) BY MOUTH DAILY. (BEDTIME) 07/22/23   Arnette Felts, FNP  JARDIANCE 10 MG TABS tablet TAKE 1 TABLET (10 MG TOTAL) BY MOUTH DAILY. (AM) 06/24/23   Arnette Felts, FNP  ketorolac (ACULAR) 0.5 % ophthalmic solution Place 1 drop into the right eye 4 (four) times daily. 01/08/23   [provider]  metoprolol succinate (TOPROL-XL) 50 MG 24 hr tablet TAKE 1 TABLET BY MOUTH DAILY. TAKE WITH OR IMMEDIATELY FOLLOWING A MEAL (AM) 08/21/23   Arnette Felts, FNP  nitroGLYCERIN (NITROSTAT) 0.4 MG SL tablet Place 1 tablet (0.4 mg total) under the tongue every 5 (five) minutes as needed for chest pain. 12/06/21   Arnette Felts, FNP  ofloxacin (OCUFLOX) 0.3 % ophthalmic solution Place 1 drop into the right eye 4 (four) times daily. 01/08/23   [provider]  pantoprazole (PROTONIX) 40 MG tablet TAKE ONE TABLET BY MOUTH DAILY IN THE MORNING. 09/18/23   Arnette Felts, FNP  potassium chloride SA (KLOR-CON M) 20 MEQ tablet TAKE ONE TABLET BY MOUTH ONCE DAILY (AM) 08/21/23   Arnette Felts, FNP  prednisoLONE acetate (PRED FORTE) 1 % ophthalmic suspension Place 1 drop into the right eye 4 (four) times daily. 01/08/23   [provider]  QUEtiapine (SEROQUEL) 25 MG tablet TAKE 1 TABLET (25 MG TOTAL) BY MOUTH AT BEDTIME. 09/18/23   Arnette Felts, FNP  rosuvastatin (CRESTOR) 20 MG tablet TAKE ONE TABLET BY MOUTH ONCE DAILY (BEDTIME) 05/27/23   Arnette Felts, FNP  Semaglutide (RYBELSUS) 3 MG TABS Take 1 tablet by mouth daily. Take 30 minutes before breakfast Patient not taking: Reported on 12/25/2022 06/06/22   Arnette Felts, FNP  Vitamin D, Ergocalciferol, (DRISDOL) 1.25 MG (50000 UNIT) CAPS capsule TAKE 1 CAPSULE TWICE A WEEK (  MONDAY & THURSDAY) 07/22/23   Arnette Felts, FNP     Allergies    Sulfa antibiotics   Review of Systems   Review of Systems Please see HPI for pertinent positives and negatives  Physical Exam BP 113/74   Pulse (!) 50   Temp 97.9 F (36.6 C)   Resp 16   Ht 5\' 5"  (1.651 m)   Wt 74.4 kg   SpO2 100%   BMI 27.29 kg/m   Physical Exam Vitals and nursing note reviewed.  Constitutional:      Appearance: Normal appearance.  HENT:     Head: Normocephalic and atraumatic.     Nose: Nose normal.     Mouth/Throat:     Mouth: Mucous membranes are moist.  Eyes:      Extraocular Movements: Extraocular movements intact.     Conjunctiva/sclera: Conjunctivae normal.  Cardiovascular:     Rate and Rhythm: Normal rate.  Pulmonary:     Effort: Pulmonary effort is normal.     Breath sounds: Normal breath sounds.  Abdominal:     General: Abdomen is flat.     Palpations: Abdomen is soft.     Tenderness: There is no abdominal tenderness.  Musculoskeletal:        General: Tenderness (L lateral elbow. Midline and paraspinal lumbar spine) present. No swelling. Normal range of motion.     Cervical back: Neck supple. Tenderness (midline and paraspinal C-spine) present.  Skin:    General: Skin is warm and dry.  Neurological:     General: No focal deficit present.     Mental Status: She is alert.  Psychiatric:        Mood and Affect: Mood normal.     ED Results / Procedures / Treatments   EKG EKG Interpretation Date/Time:  Monday October 07 2023 21:08:14 EDT Ventricular Rate:  47 PR Interval:  138 QRS Duration:  108 QT Interval:  446 QTC Calculation: 394 R Axis:   -54  Text Interpretation: Sinus bradycardia Left axis deviation T wave abnormality, consider inferolateral ischemia Abnormal ECG When compared with ECG of 12-Jul-2018 05:13, No significant change was found Confirmed by Anders Simmonds [phone]) on 10/07/2023 9:48:37 PM  Procedures Procedures  Medications Ordered in the ED Medications - No data to display  Initial Impression and Plan  Patient here with fall, unclear exact circumstances, but appears she lost her balance. I personally viewed the images from radiology studies and agree with radiologist interpretation: Xrays done in triage neg for fracture of L shoulder or hip, but there is a nondisplaced fracture of L radial head. Will place in sling and send back for xrays of lower back and CT of head/cspine.   ED Course   Clinical Course as of 10/08/23 0256  Tue Oct 08, 2023  0254 I personally viewed the images from radiology studies and  agree with radiologist interpretation:  CT and xray negative. Will plan discharged in splint with ortho follow up for elbow fracture. Son will arrange family to help keep an eye on her and help her around the house.  [CS]    Clinical Course User Index [CS] Pollyann Savoy, MD     MDM Rules/Calculators/A&P Medical Decision Making Problems Addressed: Closed fracture of left elbow, initial encounter: acute illness or injury Fall, initial encounter: acute illness or injury  Amount and/or Complexity of Data Reviewed Radiology: ordered and independent interpretation performed. Decision-making details documented in ED Course.     Final Clinical Impression(s) / ED Diagnoses  Final diagnoses:  Fall, initial encounter  Closed fracture of left elbow, initial encounter    Rx / DC Orders ED Discharge Orders     None        Pollyann Savoy, MD 10/08/23 [phone]

## 2023-10-09 ENCOUNTER — Ambulatory Visit: Payer: Self-pay | Admitting: Nurse Practitioner

## 2023-10-09 NOTE — Progress Notes (Deleted)
Madelaine Bhat, CMA,acting as a Neurosurgeon for Arnette Felts, FNP.,have documented all relevant documentation on the behalf of Arnette Felts, FNP,as directed by  Arnette Felts, FNP while in the presence of Arnette Felts, FNP.  Subjective:  Patient ID: Bridget Good , female    DOB: 25-Jul-1951 , 72 y.o.   MRN: [id number]  No chief complaint on file.   HPI  Patient presents today for a bp and dm follow up, Patient reports compliance with medication. Patient denies any chest pain, SOB, or headaches. Patient has no concerns today.     Past Medical History:  Diagnosis Date  . Angina   . Anxiety   . Bronchitis   . Depression   . Diabetes mellitus   . Fibromyalgia   . GERD (gastroesophageal reflux disease)   . Headache(784.0)   . Hyperlipidemia   . Hypertension   . Hypertensive heart disease without CHF   . Lupus    "treated for it from 1992 til 2012; dr said I don't have it anymore"  . Obesity (BMI 30-39.9)   . Osteoarthritis   . Pneumonia   . Shortness of breath    lying down, upon exertion  . Shortness of breath on exertion   . Stroke [facility])    2019     Family History  Problem Relation Age of Onset  . Hypertension Mother   . Heart attack Father   . Diabetes Father   . Diabetes Sister   . Diabetes Sister   . Heart attack Brother   . Kidney failure Brother   . Breast cancer Neg Hx      Current Outpatient Medications:  .  acetaminophen (TYLENOL) 500 MG tablet, Take 1 tablet (500 mg total) by mouth as needed for mild pain. (Patient not taking: Reported on 12/25/2022), Disp: 90 tablet, Rfl: 1 .  amLODipine-benazepril (LOTREL) 10-40 MG capsule, TAKE 1 CAPSULE BY MOUTH DAILY.(AM), Disp: 90 capsule, Rfl: 1 .  ASPIRIN LOW DOSE 81 MG tablet, TAKE ONE TABLET BY MOUTH ONCE DAILY (AM), Disp: 90 tablet, Rfl: 1 .  clopidogrel (PLAVIX) 75 MG tablet, TAKE ONE TABLET BY MOUTH ONCE DAILY (AM), Disp: 90 tablet, Rfl: 1 .  diclofenac Sodium (VOLTAREN) 1 % GEL, APPLY 2 GRAMS TOPICALLY 4 (FOUR)  TIMES DAILY., Disp: 100 g, Rfl: 2 .  donepezil (ARICEPT) 10 MG tablet, TAKE 1 TABLET (10 MG TOTAL) BY MOUTH AT BEDTIME., Disp: 30 tablet, Rfl: 2 .  escitalopram (LEXAPRO) 10 MG tablet, Take 10 mg by mouth daily., Disp: , Rfl:  .  escitalopram (LEXAPRO) 20 MG tablet, TAKE 1 TABLET (20 MG TOTAL) BY MOUTH DAILY. (BEDTIME), Disp: 90 tablet, Rfl: 1 .  JARDIANCE 10 MG TABS tablet, TAKE 1 TABLET (10 MG TOTAL) BY MOUTH DAILY. (AM), Disp: 90 tablet, Rfl: 1 .  ketorolac (ACULAR) 0.5 % ophthalmic solution, Place 1 drop into the right eye 4 (four) times daily., Disp: , Rfl:  .  metoprolol succinate (TOPROL-XL) 50 MG 24 hr tablet, TAKE 1 TABLET BY MOUTH DAILY. TAKE WITH OR IMMEDIATELY FOLLOWING A MEAL (AM), Disp: 90 tablet, Rfl: 1 .  nitroGLYCERIN (NITROSTAT) 0.4 MG SL tablet, Place 1 tablet (0.4 mg total) under the tongue every 5 (five) minutes as needed for chest pain., Disp: 25 tablet, Rfl: 12 .  ofloxacin (OCUFLOX) 0.3 % ophthalmic solution, Place 1 drop into the right eye 4 (four) times daily., Disp: , Rfl:  .  pantoprazole (PROTONIX) 40 MG tablet, TAKE ONE TABLET BY MOUTH DAILY IN  THE MORNING., Disp: 90 tablet, Rfl: 1 .  potassium chloride SA (KLOR-CON M) 20 MEQ tablet, TAKE ONE TABLET BY MOUTH ONCE DAILY (AM), Disp: 90 tablet, Rfl: 1 .  prednisoLONE acetate (PRED FORTE) 1 % ophthalmic suspension, Place 1 drop into the right eye 4 (four) times daily., Disp: , Rfl:  .  QUEtiapine (SEROQUEL) 25 MG tablet, TAKE 1 TABLET (25 MG TOTAL) BY MOUTH AT BEDTIME., Disp: 90 tablet, Rfl: 1 .  rosuvastatin (CRESTOR) 20 MG tablet, TAKE ONE TABLET BY MOUTH ONCE DAILY (BEDTIME), Disp: 90 tablet, Rfl: 1 .  Semaglutide (RYBELSUS) 3 MG TABS, Take 1 tablet by mouth daily. Take 30 minutes before breakfast (Patient not taking: Reported on 12/25/2022), Disp: 90 tablet, Rfl: 1 .  Vitamin D, Ergocalciferol, (DRISDOL) 1.25 MG (50000 UNIT) CAPS capsule, TAKE 1 CAPSULE TWICE A WEEK ( MONDAY & THURSDAY), Disp: 24 capsule, Rfl: 1    Allergies  Allergen Reactions  . Sulfa Antibiotics Hives, Itching and Nausea And Vomiting     Review of Systems  Constitutional: Negative.   HENT: Negative.    Eyes: Negative.   Respiratory: Negative.    Cardiovascular: Negative.   Gastrointestinal: Negative.     There were no vitals filed for this visit. There is no height or weight on file to calculate BMI.  Wt Readings from Last 3 Encounters:  10/07/23 164 lb (74.4 kg)  12/25/22 165 lb (74.8 kg)  09/26/22 164 lb 9.6 oz (74.7 kg)    The ASCVD Risk score (Arnett DK, et al., 2019) failed to calculate for the following reasons:   The patient has a prior MI or stroke diagnosis  Objective:  Physical Exam      Assessment And Plan:  Type 2 diabetes mellitus without complication, without long-term current use of insulin (HCC)  Essential hypertension, benign [I10]    No follow-ups on file.  Patient was given opportunity to ask questions. Patient verbalized understanding of the plan and was able to repeat key elements of the plan. All questions were answered to their satisfaction.    Jeanell Sparrow, FNP, have reviewed all documentation for this visit. The documentation on 10/09/23 for the exam, diagnosis, procedures, and orders are all accurate and complete.   IF YOU HAVE BEEN REFERRED TO A SPECIALIST, IT MAY TAKE 1-2 WEEKS TO SCHEDULE/PROCESS THE REFERRAL. IF YOU HAVE NOT HEARD FROM US/SPECIALIST IN TWO WEEKS, PLEASE GIVE [place] A CALL AT [phone].

## 2023-10-11 ENCOUNTER — Ambulatory Visit (HOSPITAL_BASED_OUTPATIENT_CLINIC_OR_DEPARTMENT_OTHER): Payer: Medicare Other | Admitting: Student

## 2023-10-11 ENCOUNTER — Encounter (HOSPITAL_BASED_OUTPATIENT_CLINIC_OR_DEPARTMENT_OTHER): Payer: Self-pay | Admitting: Student

## 2023-10-11 DIAGNOSIS — S52125A Nondisplaced fracture of head of left radius, initial encounter for closed fracture: Secondary | ICD-10-CM

## 2023-10-11 NOTE — Progress Notes (Signed)
Chief Complaint: Left elbow fracture     History of Present Illness:    Bridget Good is a 72 y.o. female presenting today with her son for evaluation after she sustained a left elbow fracture 4 days ago.  Patient states that she was in the kitchen and slipped on some water on the floor.  The fall was unwitnessed but she does not recall exactly how she hurt her elbow.  Denies hitting her head.  She was placed in an elbow splint and sling after x-rays showed a radial head fracture in the ED.  She reports that her elbow is feeling a little bit better.  She is taking Tylenol for pain as needed.  She does take Plavix 75 mg daily.  Surgical History:   None  PMH/PSH/Family History/Social History/Meds/Allergies:    Past Medical History:  Diagnosis Date   Angina    Anxiety    Bronchitis    Depression    Diabetes mellitus    Fibromyalgia    GERD (gastroesophageal reflux disease)    Headache(784.0)    Hyperlipidemia    Hypertension    Hypertensive heart disease without CHF    Lupus    "treated for it from 1992 til 2012; dr said I don't have it anymore"   Obesity (BMI 30-39.9)    Osteoarthritis    Pneumonia    Shortness of breath    lying down, upon exertion   Shortness of breath on exertion    Stroke (HCC)    2019   Past Surgical History:  Procedure Laterality Date   ABDOMINAL HYSTERECTOMY     partial   CARDIAC CATHETERIZATION  ~ 2007   CESAREAN SECTION  1978; 1981   COLONOSCOPY     CRANIOTOMY N/A 02/27/2019   Procedure: Endonasal Endoscopic biopsy of clival mass;  Surgeon: Jadene Pierini, MD;  Location: [facility] OR;  Service: Neurosurgery;  Laterality: N/A;  Endonasal Endoscopic biopsy of clival mass   ENDOSCOPIC TRANS NASAL APPROACH WITH FUSION N/A 02/27/2019   Procedure: ENDOSCOPIC TRANS NASAL APPROACH WITH FUSION;  Surgeon: Jadene Pierini, MD;  Location: [facility] OR;  Service: Neurosurgery;  Laterality: N/A;  ENDOSCOPIC TRANS NASAL  APPROACH WITH FUSION   FOOT SURGERY     "had to cut it to let the fluids out; it had swollen very badly; left foot"   Social History   Socioeconomic History   Marital status: Married    Spouse name: Not on file   Number of children: 2   Years of education: Not on file   Highest education level: Not on file  Occupational History   Occupation: retired  Tobacco Use   Smoking status: Never   Smokeless tobacco: Never  Vaping Use   Vaping status: Never Used  Substance and Sexual Activity   Alcohol use: No    Alcohol/week: 0.0 standard drinks of alcohol   Drug use: No   Sexual activity: Yes    Partners: Male    Birth control/protection: Surgical  Other Topics Concern   Not on file  Social History Narrative   Married.  Husband is psychotherapist.  Several children.  Husband is Education officer, environmental of [employer] in [place].   Social Determinants of Health   Financial Resource Strain: Low Risk  (09/26/2022)   Overall Financial Resource Strain (CARDIA)  Difficulty of Paying Living Expenses: Not hard at all  Food Insecurity: No Food Insecurity (09/26/2022)   Hunger Vital Sign    Worried About Running Out of Food in the Last Year: Never true    Ran Out of Food in the Last Year: Never true  Transportation Needs: No Transportation Needs (09/26/2022)   PRAPARE - Administrator, Civil Service (Medical): No    Lack of Transportation (Non-Medical): No  Physical Activity: Inactive (09/26/2022)   Exercise Vital Sign    Days of Exercise per Week: 0 days    Minutes of Exercise per Session: 0 min  Stress: No Stress Concern Present (09/26/2022)   [employer] of Occupational Health - Occupational Stress Questionnaire    Feeling of Stress : Not at all  Social Connections: Socially Integrated (03/09/2019)   Social Connection and Isolation Panel [NHANES]    Frequency of Communication with Friends and Family: More than three times a week    Frequency of Social Gatherings with Friends  and Family: More than three times a week    Attends Religious Services: More than 4 times per year    Active Member of [employer] or Organizations: Yes    Attends Engineer, structural: More than 4 times per year    Marital Status: Married   Family History  Problem Relation Age of Onset   Hypertension Mother    Heart attack Father    Diabetes Father    Diabetes Sister    Diabetes Sister    Heart attack Brother    Kidney failure Brother    Breast cancer Neg Hx    Allergies  Allergen Reactions   Sulfa Antibiotics Hives, Itching and Nausea And Vomiting   Current Outpatient Medications  Medication Sig Dispense Refill   acetaminophen (TYLENOL) 500 MG tablet Take 1 tablet (500 mg total) by mouth as needed for mild pain. (Patient not taking: Reported on 12/25/2022) 90 tablet 1   amLODipine-benazepril (LOTREL) 10-40 MG capsule TAKE 1 CAPSULE BY MOUTH DAILY.(AM) 90 capsule 1   ASPIRIN LOW DOSE 81 MG tablet TAKE ONE TABLET BY MOUTH ONCE DAILY (AM) 90 tablet 1   clopidogrel (PLAVIX) 75 MG tablet TAKE ONE TABLET BY MOUTH ONCE DAILY (AM) 90 tablet 1   diclofenac Sodium (VOLTAREN) 1 % GEL APPLY 2 GRAMS TOPICALLY 4 (FOUR) TIMES DAILY. 100 g 2   donepezil (ARICEPT) 10 MG tablet TAKE 1 TABLET (10 MG TOTAL) BY MOUTH AT BEDTIME. 30 tablet 2   escitalopram (LEXAPRO) 10 MG tablet Take 10 mg by mouth daily.     escitalopram (LEXAPRO) 20 MG tablet TAKE 1 TABLET (20 MG TOTAL) BY MOUTH DAILY. (BEDTIME) 90 tablet 1   JARDIANCE 10 MG TABS tablet TAKE 1 TABLET (10 MG TOTAL) BY MOUTH DAILY. (AM) 90 tablet 1   ketorolac (ACULAR) 0.5 % ophthalmic solution Place 1 drop into the right eye 4 (four) times daily.     metoprolol succinate (TOPROL-XL) 50 MG 24 hr tablet TAKE 1 TABLET BY MOUTH DAILY. TAKE WITH OR IMMEDIATELY FOLLOWING A MEAL (AM) 90 tablet 1   nitroGLYCERIN (NITROSTAT) 0.4 MG SL tablet Place 1 tablet (0.4 mg total) under the tongue every 5 (five) minutes as needed for chest pain. 25 tablet 12    ofloxacin (OCUFLOX) 0.3 % ophthalmic solution Place 1 drop into the right eye 4 (four) times daily.     pantoprazole (PROTONIX) 40 MG tablet TAKE ONE TABLET BY MOUTH DAILY IN THE MORNING. 90 tablet 1  potassium chloride SA (KLOR-CON M) 20 MEQ tablet TAKE ONE TABLET BY MOUTH ONCE DAILY (AM) 90 tablet 1   prednisoLONE acetate (PRED FORTE) 1 % ophthalmic suspension Place 1 drop into the right eye 4 (four) times daily.     QUEtiapine (SEROQUEL) 25 MG tablet TAKE 1 TABLET (25 MG TOTAL) BY MOUTH AT BEDTIME. 90 tablet 1   rosuvastatin (CRESTOR) 20 MG tablet TAKE ONE TABLET BY MOUTH ONCE DAILY (BEDTIME) 90 tablet 1   Semaglutide (RYBELSUS) 3 MG TABS Take 1 tablet by mouth daily. Take 30 minutes before breakfast (Patient not taking: Reported on 12/25/2022) 90 tablet 1   Vitamin D, Ergocalciferol, (DRISDOL) 1.25 MG (50000 UNIT) CAPS capsule TAKE 1 CAPSULE TWICE A WEEK ( MONDAY & THURSDAY) 24 capsule 1   No current facility-administered medications for this visit.   No results found.  Review of Systems:   A ROS was performed including pertinent positives and negatives as documented in the HPI.  Physical Exam :   Constitutional: NAD and appears stated age Neurological: Alert and oriented Psych: Appropriate affect and cooperative There were no vitals taken for this visit.   Comprehensive Musculoskeletal Exam:    No evidence of injury or deformity over the left elbow.  Active range of motion of the left elbow from 20 to 110 degrees.  She is able to pronate and supinate the forearm without pain.  No significant tenderness palpation around the elbow.  Imaging:   Xray (left elbow 3 views): Nondisplaced radial head fracture.  Effusion present.   I personally reviewed and interpreted the radiographs.   Assessment:   72 y.o. female with a nondisplaced left radial head fracture.  She sustained this during a fall 4 days ago and already reports doing some better.  We will proceed with nonoperative  management and given the stability of the fracture I believe she can continue with gentle range of motion as tolerated.  I did remove her from the splint today and she can continue with the sling as needed.  Would recommend avoiding any lifting over a few pounds.  Continue Tylenol as needed for pain.  Will plan to follow-up as needed.  Plan :    -Return to clinic as needed     I personally saw and evaluated the patient, and participated in the management and treatment plan.  Hazle Nordmann, PA-C Orthopedics

## 2023-11-21 ENCOUNTER — Other Ambulatory Visit: Payer: Self-pay | Admitting: Nurse Practitioner

## 2023-11-21 DIAGNOSIS — E119 Type 2 diabetes mellitus without complications: Secondary | ICD-10-CM

## 2023-11-21 DIAGNOSIS — F02B4 Dementia in other diseases classified elsewhere, moderate, with anxiety: Secondary | ICD-10-CM

## 2023-12-18 ENCOUNTER — Telehealth: Payer: Self-pay | Admitting: Nurse Practitioner

## 2023-12-18 NOTE — Telephone Encounter (Signed)
 VM left on son phone to return call to office for appt or we will need to set up Remote [facility]

## 2023-12-23 ENCOUNTER — Other Ambulatory Visit: Payer: Self-pay | Admitting: Nurse Practitioner

## 2023-12-23 DIAGNOSIS — F331 Major depressive disorder, recurrent, moderate: Secondary | ICD-10-CM

## 2024-02-03 ENCOUNTER — Other Ambulatory Visit: Payer: Self-pay | Admitting: Nurse Practitioner

## 2024-02-03 DIAGNOSIS — F331 Major depressive disorder, recurrent, moderate: Secondary | ICD-10-CM

## 2024-02-03 DIAGNOSIS — E559 Vitamin D deficiency, unspecified: Secondary | ICD-10-CM

## 2024-03-04 ENCOUNTER — Other Ambulatory Visit: Payer: Self-pay | Admitting: Nurse Practitioner

## 2024-03-04 DIAGNOSIS — G300 Alzheimer's disease with early onset: Secondary | ICD-10-CM

## 2024-03-04 DIAGNOSIS — E559 Vitamin D deficiency, unspecified: Secondary | ICD-10-CM

## 2024-04-10 ENCOUNTER — Other Ambulatory Visit: Payer: Self-pay | Admitting: Nurse Practitioner

## 2024-04-10 DIAGNOSIS — F02B4 Dementia in other diseases classified elsewhere, moderate, with anxiety: Secondary | ICD-10-CM

## 2024-04-10 DIAGNOSIS — F331 Major depressive disorder, recurrent, moderate: Secondary | ICD-10-CM

## 2024-06-15 ENCOUNTER — Other Ambulatory Visit: Payer: Self-pay | Admitting: Nurse Practitioner

## 2024-06-15 DIAGNOSIS — E119 Type 2 diabetes mellitus without complications: Secondary | ICD-10-CM

## 2024-06-26 ENCOUNTER — Encounter: Payer: Self-pay | Admitting: Advanced Practice Midwife

## 2024-07-17 ENCOUNTER — Other Ambulatory Visit: Payer: Self-pay | Admitting: Nurse Practitioner

## 2024-07-17 DIAGNOSIS — E119 Type 2 diabetes mellitus without complications: Secondary | ICD-10-CM

## 2024-08-18 ENCOUNTER — Other Ambulatory Visit: Payer: Self-pay | Admitting: Nurse Practitioner

## 2024-09-15 ENCOUNTER — Other Ambulatory Visit: Payer: Self-pay | Admitting: Nurse Practitioner

## 2024-10-12 ENCOUNTER — Other Ambulatory Visit: Payer: Self-pay | Admitting: Nurse Practitioner
